# Patient Record
Sex: Male | Born: 1945 | Race: White | Hispanic: No | Smoking: Former smoker
Health system: Southern US, Community
[De-identification: ages and names within clinical notes are randomized; demographics above are authoritative.]

## PROBLEM LIST (undated history)

## (undated) DIAGNOSIS — J449 Chronic obstructive pulmonary disease, unspecified: Secondary | ICD-10-CM

## (undated) DIAGNOSIS — Z860100 Personal history of colon polyps, unspecified: Secondary | ICD-10-CM

## (undated) DIAGNOSIS — D179 Benign lipomatous neoplasm, unspecified: Secondary | ICD-10-CM

## (undated) DIAGNOSIS — E785 Hyperlipidemia, unspecified: Secondary | ICD-10-CM

## (undated) DIAGNOSIS — Z9889 Other specified postprocedural states: Secondary | ICD-10-CM

## (undated) DIAGNOSIS — G5600 Carpal tunnel syndrome, unspecified upper limb: Secondary | ICD-10-CM

## (undated) DIAGNOSIS — H409 Unspecified glaucoma: Secondary | ICD-10-CM

## (undated) DIAGNOSIS — R112 Nausea with vomiting, unspecified: Secondary | ICD-10-CM

## (undated) DIAGNOSIS — I639 Cerebral infarction, unspecified: Secondary | ICD-10-CM

## (undated) DIAGNOSIS — I10 Essential (primary) hypertension: Secondary | ICD-10-CM

## (undated) DIAGNOSIS — J309 Allergic rhinitis, unspecified: Secondary | ICD-10-CM

## (undated) DIAGNOSIS — F419 Anxiety disorder, unspecified: Secondary | ICD-10-CM

## (undated) DIAGNOSIS — C44219 Basal cell carcinoma of skin of left ear and external auricular canal: Secondary | ICD-10-CM

## (undated) DIAGNOSIS — J4489 Other specified chronic obstructive pulmonary disease: Secondary | ICD-10-CM

## (undated) DIAGNOSIS — I6529 Occlusion and stenosis of unspecified carotid artery: Secondary | ICD-10-CM

## (undated) DIAGNOSIS — Z85828 Personal history of other malignant neoplasm of skin: Secondary | ICD-10-CM

## (undated) DIAGNOSIS — T7840XA Allergy, unspecified, initial encounter: Secondary | ICD-10-CM

## (undated) DIAGNOSIS — F32A Depression, unspecified: Secondary | ICD-10-CM

## (undated) DIAGNOSIS — K219 Gastro-esophageal reflux disease without esophagitis: Secondary | ICD-10-CM

## (undated) DIAGNOSIS — M1710 Unilateral primary osteoarthritis, unspecified knee: Secondary | ICD-10-CM

## (undated) DIAGNOSIS — IMO0002 Reserved for concepts with insufficient information to code with codable children: Secondary | ICD-10-CM

## (undated) DIAGNOSIS — R0600 Dyspnea, unspecified: Secondary | ICD-10-CM

## (undated) DIAGNOSIS — C801 Malignant (primary) neoplasm, unspecified: Secondary | ICD-10-CM

## (undated) DIAGNOSIS — Z87442 Personal history of urinary calculi: Secondary | ICD-10-CM

## (undated) DIAGNOSIS — I2510 Atherosclerotic heart disease of native coronary artery without angina pectoris: Secondary | ICD-10-CM

## (undated) DIAGNOSIS — I739 Peripheral vascular disease, unspecified: Secondary | ICD-10-CM

## (undated) HISTORY — DX: Basal cell carcinoma of skin of left ear and external auricular canal: C44.219

## (undated) HISTORY — PX: COLONOSCOPY WITH ESOPHAGOGASTRODUODENOSCOPY (EGD): SHX5779

## (undated) HISTORY — DX: Allergic rhinitis, unspecified: J30.9

## (undated) HISTORY — DX: Personal history of colon polyps, unspecified: Z86.0100

## (undated) HISTORY — DX: Occlusion and stenosis of unspecified carotid artery: I65.29

## (undated) HISTORY — PX: CATARACT EXTRACTION: SUR2

## (undated) HISTORY — DX: Chronic obstructive pulmonary disease, unspecified: J44.9

## (undated) HISTORY — DX: Gastro-esophageal reflux disease without esophagitis: K21.9

## (undated) HISTORY — DX: Other specified chronic obstructive pulmonary disease: J44.89

## (undated) HISTORY — DX: Unilateral primary osteoarthritis, unspecified knee: M17.10

## (undated) HISTORY — PX: COLONOSCOPY W/ POLYPECTOMY: SHX1380

## (undated) HISTORY — DX: Personal history of other malignant neoplasm of skin: Z85.828

## (undated) HISTORY — DX: Personal history of colonic polyps: Z86.010

## (undated) HISTORY — DX: Carpal tunnel syndrome, unspecified upper limb: G56.00

## (undated) HISTORY — DX: Essential (primary) hypertension: I10

## (undated) HISTORY — DX: Malignant (primary) neoplasm, unspecified: C80.1

## (undated) HISTORY — PX: BACK SURGERY: SHX140

## (undated) HISTORY — PX: TOTAL KNEE ARTHROPLASTY: SHX125

## (undated) HISTORY — DX: Hyperlipidemia, unspecified: E78.5

## (undated) HISTORY — DX: Unspecified glaucoma: H40.9

## (undated) HISTORY — DX: Reserved for concepts with insufficient information to code with codable children: IMO0002

## (undated) HISTORY — DX: Benign lipomatous neoplasm, unspecified: D17.9

## (undated) HISTORY — DX: Allergy, unspecified, initial encounter: T78.40XA

---

## 2000-01-18 ENCOUNTER — Encounter: Payer: Self-pay | Admitting: Family Medicine

## 2000-01-18 ENCOUNTER — Encounter: Admission: RE | Admit: 2000-01-18 | Discharge: 2000-01-18 | Payer: Self-pay | Admitting: Family Medicine

## 2000-02-14 ENCOUNTER — Encounter: Payer: Self-pay | Admitting: Family Medicine

## 2000-02-14 ENCOUNTER — Encounter: Admission: RE | Admit: 2000-02-14 | Discharge: 2000-02-14 | Payer: Self-pay | Admitting: Family Medicine

## 2000-09-02 ENCOUNTER — Encounter: Payer: Self-pay | Admitting: Family Medicine

## 2000-09-02 ENCOUNTER — Encounter: Admission: RE | Admit: 2000-09-02 | Discharge: 2000-09-02 | Payer: Self-pay | Admitting: Family Medicine

## 2003-02-08 ENCOUNTER — Encounter: Admission: RE | Admit: 2003-02-08 | Discharge: 2003-02-08 | Payer: Self-pay | Admitting: Family Medicine

## 2003-02-24 ENCOUNTER — Encounter: Admission: RE | Admit: 2003-02-24 | Discharge: 2003-02-24 | Payer: Self-pay | Admitting: Family Medicine

## 2003-06-01 HISTORY — PX: OTHER SURGICAL HISTORY: SHX169

## 2003-06-09 ENCOUNTER — Ambulatory Visit (HOSPITAL_BASED_OUTPATIENT_CLINIC_OR_DEPARTMENT_OTHER): Admission: RE | Admit: 2003-06-09 | Discharge: 2003-06-09 | Payer: Self-pay | Admitting: *Deleted

## 2003-06-09 ENCOUNTER — Ambulatory Visit (HOSPITAL_COMMUNITY): Admission: RE | Admit: 2003-06-09 | Discharge: 2003-06-09 | Payer: Self-pay | Admitting: *Deleted

## 2003-10-01 ENCOUNTER — Encounter (INDEPENDENT_AMBULATORY_CARE_PROVIDER_SITE_OTHER): Payer: Self-pay | Admitting: Internal Medicine

## 2004-05-03 LAB — FECAL OCCULT BLOOD, GUAIAC: Fecal Occult Blood: NEGATIVE

## 2004-05-25 ENCOUNTER — Ambulatory Visit: Payer: Self-pay | Admitting: Family Medicine

## 2004-05-31 ENCOUNTER — Ambulatory Visit: Payer: Self-pay | Admitting: Family Medicine

## 2004-06-22 ENCOUNTER — Ambulatory Visit: Payer: Self-pay | Admitting: Family Medicine

## 2004-09-12 ENCOUNTER — Ambulatory Visit: Payer: Self-pay | Admitting: Family Medicine

## 2004-12-01 ENCOUNTER — Encounter (INDEPENDENT_AMBULATORY_CARE_PROVIDER_SITE_OTHER): Payer: Self-pay | Admitting: Internal Medicine

## 2004-12-01 LAB — CONVERTED CEMR LAB: PSA: 0.35 ng/mL

## 2004-12-20 ENCOUNTER — Ambulatory Visit: Payer: Self-pay | Admitting: Family Medicine

## 2005-02-02 ENCOUNTER — Ambulatory Visit: Payer: Self-pay | Admitting: Family Medicine

## 2005-03-21 ENCOUNTER — Ambulatory Visit (HOSPITAL_COMMUNITY): Admission: RE | Admit: 2005-03-21 | Discharge: 2005-03-21 | Payer: Self-pay | Admitting: *Deleted

## 2005-03-21 ENCOUNTER — Encounter (INDEPENDENT_AMBULATORY_CARE_PROVIDER_SITE_OTHER): Payer: Self-pay | Admitting: *Deleted

## 2005-03-21 ENCOUNTER — Ambulatory Visit (HOSPITAL_BASED_OUTPATIENT_CLINIC_OR_DEPARTMENT_OTHER): Admission: RE | Admit: 2005-03-21 | Discharge: 2005-03-21 | Payer: Self-pay | Admitting: *Deleted

## 2005-03-23 ENCOUNTER — Ambulatory Visit: Payer: Self-pay | Admitting: Family Medicine

## 2005-06-19 ENCOUNTER — Ambulatory Visit: Payer: Self-pay | Admitting: Family Medicine

## 2005-06-25 ENCOUNTER — Ambulatory Visit: Payer: Self-pay | Admitting: Family Medicine

## 2005-07-27 ENCOUNTER — Ambulatory Visit: Payer: Self-pay | Admitting: Family Medicine

## 2005-12-01 ENCOUNTER — Encounter (INDEPENDENT_AMBULATORY_CARE_PROVIDER_SITE_OTHER): Payer: Self-pay | Admitting: Internal Medicine

## 2005-12-01 LAB — CONVERTED CEMR LAB: PSA: 1 ng/mL

## 2005-12-13 ENCOUNTER — Ambulatory Visit: Payer: Self-pay | Admitting: Family Medicine

## 2006-01-16 ENCOUNTER — Ambulatory Visit: Payer: Self-pay | Admitting: Family Medicine

## 2006-02-07 ENCOUNTER — Ambulatory Visit: Payer: Self-pay | Admitting: Family Medicine

## 2006-04-12 ENCOUNTER — Ambulatory Visit: Payer: Self-pay | Admitting: Family Medicine

## 2006-06-13 ENCOUNTER — Ambulatory Visit: Payer: Self-pay | Admitting: Family Medicine

## 2006-06-13 LAB — CONVERTED CEMR LAB
CO2: 30 meq/L (ref 19–32)
Chloride: 107 meq/L (ref 96–112)
Cholesterol: 200 mg/dL (ref 0–200)
Creatinine, Ser: 0.7 mg/dL (ref 0.4–1.5)
HDL: 69.1 mg/dL (ref >39.0)
Potassium: 5.1 meq/L (ref 3.5–5.1)
Sodium: 143 meq/L (ref 135–145)
Triglycerides: 83 mg/dL (ref 0–149)

## 2006-07-30 ENCOUNTER — Telehealth (INDEPENDENT_AMBULATORY_CARE_PROVIDER_SITE_OTHER): Payer: Self-pay | Admitting: Internal Medicine

## 2006-07-30 ENCOUNTER — Encounter: Admission: RE | Admit: 2006-07-30 | Discharge: 2006-07-30 | Payer: Self-pay | Admitting: Family Medicine

## 2006-09-03 ENCOUNTER — Ambulatory Visit: Payer: Self-pay | Admitting: Family Medicine

## 2006-09-30 ENCOUNTER — Encounter: Payer: Self-pay | Admitting: Internal Medicine

## 2006-09-30 DIAGNOSIS — E785 Hyperlipidemia, unspecified: Secondary | ICD-10-CM | POA: Insufficient documentation

## 2006-09-30 DIAGNOSIS — K219 Gastro-esophageal reflux disease without esophagitis: Secondary | ICD-10-CM | POA: Insufficient documentation

## 2006-09-30 DIAGNOSIS — G5600 Carpal tunnel syndrome, unspecified upper limb: Secondary | ICD-10-CM | POA: Insufficient documentation

## 2006-09-30 DIAGNOSIS — Z8679 Personal history of other diseases of the circulatory system: Secondary | ICD-10-CM | POA: Insufficient documentation

## 2006-09-30 DIAGNOSIS — Z85828 Personal history of other malignant neoplasm of skin: Secondary | ICD-10-CM | POA: Insufficient documentation

## 2006-09-30 DIAGNOSIS — I10 Essential (primary) hypertension: Secondary | ICD-10-CM | POA: Insufficient documentation

## 2006-09-30 DIAGNOSIS — F528 Other sexual dysfunction not due to a substance or known physiological condition: Secondary | ICD-10-CM | POA: Insufficient documentation

## 2006-12-24 ENCOUNTER — Ambulatory Visit: Payer: Self-pay | Admitting: Family Medicine

## 2006-12-24 DIAGNOSIS — D179 Benign lipomatous neoplasm, unspecified: Secondary | ICD-10-CM | POA: Insufficient documentation

## 2006-12-25 LAB — CONVERTED CEMR LAB
AST: 28 units/L (ref 0–37)
BUN: 19 mg/dL (ref 6–23)
CO2: 30 meq/L (ref 19–32)
Calcium: 9.9 mg/dL (ref 8.4–10.5)
Cholesterol: 191 mg/dL (ref 0–200)
Creatinine, Ser: 0.8 mg/dL (ref 0.4–1.5)
GFR calc Af Amer: 127 mL/min
Glucose, Bld: 112 mg/dL — ABNORMAL HIGH (ref 70–99)
PSA: 0.78 ng/mL (ref 0.10–4.00)
Total CHOL/HDL Ratio: 2.7
Triglycerides: 78 mg/dL (ref 0–149)

## 2007-01-24 ENCOUNTER — Ambulatory Visit: Payer: Self-pay | Admitting: Family Medicine

## 2007-03-18 ENCOUNTER — Ambulatory Visit: Payer: Self-pay | Admitting: Family Medicine

## 2007-03-24 HISTORY — PX: SHOULDER SURGERY: SHX246

## 2007-04-07 ENCOUNTER — Encounter (INDEPENDENT_AMBULATORY_CARE_PROVIDER_SITE_OTHER): Payer: Self-pay | Admitting: Internal Medicine

## 2007-05-05 ENCOUNTER — Encounter: Payer: Self-pay | Admitting: Family Medicine

## 2007-05-12 ENCOUNTER — Ambulatory Visit: Payer: Self-pay | Admitting: Family Medicine

## 2007-07-02 ENCOUNTER — Ambulatory Visit: Payer: Self-pay | Admitting: Family Medicine

## 2007-07-02 DIAGNOSIS — E669 Obesity, unspecified: Secondary | ICD-10-CM | POA: Insufficient documentation

## 2007-07-02 DIAGNOSIS — F411 Generalized anxiety disorder: Secondary | ICD-10-CM | POA: Insufficient documentation

## 2007-07-07 LAB — CONVERTED CEMR LAB
BUN: 22 mg/dL (ref 6–23)
CO2: 27 meq/L (ref 19–32)
Calcium: 9.5 mg/dL (ref 8.4–10.5)
Creatinine, Ser: 0.7 mg/dL (ref 0.4–1.5)
GFR calc Af Amer: 148 mL/min
Glucose, Bld: 115 mg/dL — ABNORMAL HIGH (ref 70–99)
HDL: 75.1 mg/dL (ref >39.0)
Sodium: 139 meq/L (ref 135–145)
Triglycerides: 86 mg/dL (ref 0–149)
VLDL: 17 mg/dL (ref 0–40)

## 2007-07-08 ENCOUNTER — Telehealth (INDEPENDENT_AMBULATORY_CARE_PROVIDER_SITE_OTHER): Payer: Self-pay | Admitting: Internal Medicine

## 2007-07-21 ENCOUNTER — Telehealth (INDEPENDENT_AMBULATORY_CARE_PROVIDER_SITE_OTHER): Payer: Self-pay | Admitting: Internal Medicine

## 2007-07-21 DIAGNOSIS — J309 Allergic rhinitis, unspecified: Secondary | ICD-10-CM | POA: Insufficient documentation

## 2007-07-22 ENCOUNTER — Ambulatory Visit: Payer: Self-pay | Admitting: Family Medicine

## 2007-08-15 ENCOUNTER — Telehealth: Payer: Self-pay | Admitting: Internal Medicine

## 2007-08-19 ENCOUNTER — Encounter (INDEPENDENT_AMBULATORY_CARE_PROVIDER_SITE_OTHER): Payer: Self-pay | Admitting: Internal Medicine

## 2007-08-19 ENCOUNTER — Ambulatory Visit: Payer: Self-pay | Admitting: Family Medicine

## 2007-08-29 ENCOUNTER — Ambulatory Visit: Payer: Self-pay | Admitting: Family Medicine

## 2007-08-29 ENCOUNTER — Encounter (INDEPENDENT_AMBULATORY_CARE_PROVIDER_SITE_OTHER): Payer: Self-pay | Admitting: Internal Medicine

## 2007-09-01 HISTORY — PX: KNEE ARTHROSCOPY: SUR90

## 2007-09-04 ENCOUNTER — Encounter (INDEPENDENT_AMBULATORY_CARE_PROVIDER_SITE_OTHER): Payer: Self-pay | Admitting: Internal Medicine

## 2007-09-29 ENCOUNTER — Telehealth (INDEPENDENT_AMBULATORY_CARE_PROVIDER_SITE_OTHER): Payer: Self-pay | Admitting: Internal Medicine

## 2007-09-30 ENCOUNTER — Encounter (INDEPENDENT_AMBULATORY_CARE_PROVIDER_SITE_OTHER): Payer: Self-pay | Admitting: Internal Medicine

## 2007-10-02 ENCOUNTER — Ambulatory Visit: Payer: Self-pay | Admitting: Internal Medicine

## 2007-10-02 DIAGNOSIS — J449 Chronic obstructive pulmonary disease, unspecified: Secondary | ICD-10-CM | POA: Insufficient documentation

## 2007-10-02 DIAGNOSIS — J441 Chronic obstructive pulmonary disease with (acute) exacerbation: Secondary | ICD-10-CM | POA: Insufficient documentation

## 2007-10-02 HISTORY — DX: Chronic obstructive pulmonary disease with (acute) exacerbation: J44.1

## 2007-10-17 ENCOUNTER — Telehealth (INDEPENDENT_AMBULATORY_CARE_PROVIDER_SITE_OTHER): Payer: Self-pay | Admitting: *Deleted

## 2007-10-28 ENCOUNTER — Ambulatory Visit: Payer: Self-pay | Admitting: Internal Medicine

## 2007-11-03 ENCOUNTER — Encounter: Payer: Self-pay | Admitting: Internal Medicine

## 2007-11-04 ENCOUNTER — Ambulatory Visit: Payer: Self-pay | Admitting: Internal Medicine

## 2007-11-04 DIAGNOSIS — F17200 Nicotine dependence, unspecified, uncomplicated: Secondary | ICD-10-CM | POA: Insufficient documentation

## 2008-01-06 ENCOUNTER — Ambulatory Visit: Payer: Self-pay | Admitting: Family Medicine

## 2008-01-08 LAB — CONVERTED CEMR LAB
AST: 32 units/L (ref 0–37)
BUN: 23 mg/dL (ref 6–23)
Calcium: 9.3 mg/dL (ref 8.4–10.5)
Chloride: 105 meq/L (ref 96–112)
Cholesterol: 177 mg/dL (ref 0–200)
Creatinine, Ser: 0.7 mg/dL (ref 0.4–1.5)
GFR calc Af Amer: 147 mL/min
GFR calc non Af Amer: 122 mL/min
HDL: 72.9 mg/dL (ref >39.0)
LDL Cholesterol: 87 mg/dL (ref 0–99)
Total CHOL/HDL Ratio: 2.4
Triglycerides: 86 mg/dL (ref 0–149)
VLDL: 17 mg/dL (ref 0–40)

## 2008-02-05 ENCOUNTER — Ambulatory Visit: Payer: Self-pay | Admitting: Internal Medicine

## 2008-02-18 ENCOUNTER — Ambulatory Visit: Payer: Self-pay | Admitting: Family Medicine

## 2008-02-19 ENCOUNTER — Telehealth (INDEPENDENT_AMBULATORY_CARE_PROVIDER_SITE_OTHER): Payer: Self-pay | Admitting: Internal Medicine

## 2008-03-17 ENCOUNTER — Ambulatory Visit: Payer: Self-pay | Admitting: Family Medicine

## 2008-03-17 ENCOUNTER — Encounter (INDEPENDENT_AMBULATORY_CARE_PROVIDER_SITE_OTHER): Payer: Self-pay | Admitting: Internal Medicine

## 2008-04-09 ENCOUNTER — Telehealth (INDEPENDENT_AMBULATORY_CARE_PROVIDER_SITE_OTHER): Payer: Self-pay | Admitting: Internal Medicine

## 2008-04-27 ENCOUNTER — Ambulatory Visit: Payer: Self-pay | Admitting: Family Medicine

## 2008-06-20 ENCOUNTER — Encounter (INDEPENDENT_AMBULATORY_CARE_PROVIDER_SITE_OTHER): Payer: Self-pay | Admitting: Internal Medicine

## 2008-07-06 ENCOUNTER — Ambulatory Visit: Payer: Self-pay | Admitting: Family Medicine

## 2008-07-07 LAB — CONVERTED CEMR LAB
ALT: 33 units/L (ref 0–53)
Albumin: 4 g/dL (ref 3.5–5.2)
Bilirubin, Direct: 0 mg/dL (ref 0.0–0.3)
Total Protein: 7.2 g/dL (ref 6.0–8.3)

## 2008-07-08 LAB — CONVERTED CEMR LAB
Cholesterol: 182 mg/dL (ref 0–200)
LDL Cholesterol: 90 mg/dL (ref 0–99)

## 2008-07-23 ENCOUNTER — Ambulatory Visit: Payer: Self-pay | Admitting: Family Medicine

## 2008-07-23 DIAGNOSIS — M1710 Unilateral primary osteoarthritis, unspecified knee: Secondary | ICD-10-CM

## 2008-07-23 DIAGNOSIS — G8929 Other chronic pain: Secondary | ICD-10-CM | POA: Insufficient documentation

## 2008-07-29 ENCOUNTER — Ambulatory Visit: Payer: Self-pay | Admitting: Family Medicine

## 2008-07-29 ENCOUNTER — Encounter: Payer: Self-pay | Admitting: Family Medicine

## 2008-09-06 ENCOUNTER — Telehealth: Payer: Self-pay | Admitting: Family Medicine

## 2008-09-07 ENCOUNTER — Ambulatory Visit: Payer: Self-pay | Admitting: Family Medicine

## 2008-10-04 ENCOUNTER — Encounter (INDEPENDENT_AMBULATORY_CARE_PROVIDER_SITE_OTHER): Payer: Self-pay | Admitting: Internal Medicine

## 2008-10-04 LAB — HM COLONOSCOPY: HM Colonoscopy: ABNORMAL

## 2008-10-08 ENCOUNTER — Ambulatory Visit: Payer: Self-pay | Admitting: Family Medicine

## 2008-10-13 ENCOUNTER — Encounter (INDEPENDENT_AMBULATORY_CARE_PROVIDER_SITE_OTHER): Payer: Self-pay | Admitting: *Deleted

## 2008-10-27 ENCOUNTER — Ambulatory Visit: Payer: Self-pay | Admitting: Family Medicine

## 2008-10-27 ENCOUNTER — Encounter (INDEPENDENT_AMBULATORY_CARE_PROVIDER_SITE_OTHER): Payer: Self-pay | Admitting: Internal Medicine

## 2008-10-27 DIAGNOSIS — K439 Ventral hernia without obstruction or gangrene: Secondary | ICD-10-CM | POA: Insufficient documentation

## 2008-10-27 LAB — CONVERTED CEMR LAB
BUN: 20 mg/dL (ref 6–23)
CO2: 30 meq/L (ref 19–32)
Chloride: 105 meq/L (ref 96–112)
PSA: 1.07 ng/mL (ref 0.10–4.00)
Potassium: 4.4 meq/L (ref 3.5–5.1)

## 2009-01-10 ENCOUNTER — Ambulatory Visit: Payer: Self-pay | Admitting: Family Medicine

## 2009-01-11 ENCOUNTER — Telehealth (INDEPENDENT_AMBULATORY_CARE_PROVIDER_SITE_OTHER): Payer: Self-pay | Admitting: Internal Medicine

## 2009-01-11 LAB — CONVERTED CEMR LAB
ALT: 37 units/L (ref 0–53)
Cholesterol: 179 mg/dL (ref 0–200)
HDL: 67.2 mg/dL (ref >39.00)
LDL Cholesterol: 88 mg/dL (ref 0–99)
VLDL: 23.8 mg/dL (ref 0.0–40.0)

## 2009-01-21 ENCOUNTER — Ambulatory Visit: Payer: Self-pay | Admitting: Family Medicine

## 2009-02-02 ENCOUNTER — Encounter (INDEPENDENT_AMBULATORY_CARE_PROVIDER_SITE_OTHER): Payer: Self-pay | Admitting: Internal Medicine

## 2009-02-02 ENCOUNTER — Ambulatory Visit: Payer: Self-pay | Admitting: Family Medicine

## 2009-02-17 ENCOUNTER — Ambulatory Visit: Payer: Self-pay | Admitting: Family Medicine

## 2009-02-21 ENCOUNTER — Ambulatory Visit: Payer: Self-pay | Admitting: Family Medicine

## 2009-02-23 LAB — CONVERTED CEMR LAB
ALT: 33 units/L (ref 0–53)
BUN: 18 mg/dL (ref 6–23)
Basophils Absolute: 0 10*3/uL (ref 0.0–0.1)
Chloride: 105 meq/L (ref 96–112)
Eosinophils Absolute: 0.3 10*3/uL (ref 0.0–0.7)
Glucose, Bld: 118 mg/dL — ABNORMAL HIGH (ref 70–99)
HCT: 41 % (ref 39.0–52.0)
Lymphs Abs: 1.6 10*3/uL (ref 0.7–4.0)
MCHC: 34.6 g/dL (ref 30.0–36.0)
MCV: 93 fL (ref 78.0–100.0)
Monocytes Absolute: 0.5 10*3/uL (ref 0.1–1.0)
Platelets: 167 10*3/uL (ref 150.0–400.0)
Potassium: 4.8 meq/L (ref 3.5–5.1)
RDW: 12.1 % (ref 11.5–14.6)
Total Bilirubin: 0.7 mg/dL (ref 0.3–1.2)

## 2009-02-28 ENCOUNTER — Ambulatory Visit: Payer: Self-pay | Admitting: General Practice

## 2009-03-04 ENCOUNTER — Encounter (INDEPENDENT_AMBULATORY_CARE_PROVIDER_SITE_OTHER): Payer: Self-pay | Admitting: Internal Medicine

## 2009-03-15 ENCOUNTER — Inpatient Hospital Stay: Payer: Self-pay | Admitting: General Practice

## 2009-05-26 ENCOUNTER — Ambulatory Visit: Payer: Self-pay | Admitting: Family Medicine

## 2009-07-15 ENCOUNTER — Ambulatory Visit: Payer: Self-pay | Admitting: Family Medicine

## 2009-07-19 LAB — CONVERTED CEMR LAB
Cholesterol: 141 mg/dL (ref 0–200)
Glucose, Bld: 99 mg/dL (ref 70–99)
LDL Cholesterol: 77 mg/dL (ref 0–99)
Triglycerides: 64 mg/dL (ref 0.0–149.0)
VLDL: 12.8 mg/dL (ref 0.0–40.0)

## 2009-09-20 ENCOUNTER — Ambulatory Visit: Payer: Self-pay | Admitting: Family Medicine

## 2009-09-20 DIAGNOSIS — M549 Dorsalgia, unspecified: Secondary | ICD-10-CM

## 2009-09-20 DIAGNOSIS — R300 Dysuria: Secondary | ICD-10-CM | POA: Insufficient documentation

## 2009-09-20 DIAGNOSIS — G8929 Other chronic pain: Secondary | ICD-10-CM | POA: Insufficient documentation

## 2009-09-20 LAB — CONVERTED CEMR LAB
Blood in Urine, dipstick: NEGATIVE
Ketones, urine, test strip: NEGATIVE
Nitrite: NEGATIVE
Specific Gravity, Urine: 1.015

## 2009-09-21 ENCOUNTER — Encounter: Payer: Self-pay | Admitting: Family Medicine

## 2009-10-25 ENCOUNTER — Ambulatory Visit: Payer: Self-pay | Admitting: Family Medicine

## 2009-10-25 LAB — CONVERTED CEMR LAB
Albumin: 4.5 g/dL (ref 3.5–5.2)
Alkaline Phosphatase: 73 units/L (ref 39–117)
BUN: 25 mg/dL — ABNORMAL HIGH (ref 6–23)
Basophils Relative: 0.8 % (ref 0.0–3.0)
Calcium: 9.5 mg/dL (ref 8.4–10.5)
Creatinine, Ser: 0.7 mg/dL (ref 0.4–1.5)
Eosinophils Relative: 5.3 % — ABNORMAL HIGH (ref 0.0–5.0)
GFR calc non Af Amer: 113.43 mL/min (ref >60)
Glucose, Bld: 122 mg/dL — ABNORMAL HIGH (ref 70–99)
HCT: 40.5 % (ref 39.0–52.0)
HDL: 83.3 mg/dL (ref >39.00)
Hemoglobin: 14 g/dL (ref 13.0–17.0)
Lymphs Abs: 1.7 10*3/uL (ref 0.7–4.0)
Monocytes Relative: 9.9 % (ref 3.0–12.0)
Neutro Abs: 2.9 10*3/uL (ref 1.4–7.7)
RBC: 4.44 M/uL (ref 4.22–5.81)
Sodium: 141 meq/L (ref 135–145)
Triglycerides: 144 mg/dL (ref 0.0–149.0)
WBC: 5.4 10*3/uL (ref 4.5–10.5)

## 2009-10-28 ENCOUNTER — Ambulatory Visit: Payer: Self-pay | Admitting: Family Medicine

## 2010-01-06 ENCOUNTER — Ambulatory Visit: Payer: Self-pay | Admitting: Family Medicine

## 2010-01-27 ENCOUNTER — Telehealth: Payer: Self-pay | Admitting: Family Medicine

## 2010-03-31 ENCOUNTER — Ambulatory Visit
Admission: RE | Admit: 2010-03-31 | Discharge: 2010-03-31 | Payer: Self-pay | Source: Home / Self Care | Attending: Family Medicine | Admitting: Family Medicine

## 2010-03-31 DIAGNOSIS — M6200 Separation of muscle (nontraumatic), unspecified site: Secondary | ICD-10-CM | POA: Insufficient documentation

## 2010-03-31 DIAGNOSIS — J441 Chronic obstructive pulmonary disease with (acute) exacerbation: Secondary | ICD-10-CM | POA: Insufficient documentation

## 2010-04-14 ENCOUNTER — Ambulatory Visit: Payer: Self-pay

## 2010-05-04 NOTE — Assessment & Plan Note (Signed)
Summary: CPX/DLO  R/S FROM 11/01/09   Vital Signs:  Patient profile:   65 year old male Height:      70.5 inches Weight:      245.4 pounds BMI:     34.84 Temp:     98.7 degrees F oral Pulse rate:   72 / minute Pulse rhythm:   regular BP sitting:   102 / 72  (left arm) Cuff size:   regular  Vitals Entered By: Benny Lennert CMA [facility] Dull) (October 28, 2009 8:29 AM)  History of Present Illness: Chief complaint cpx  Preventive Screening-Counseling & Management  Alcohol-Tobacco     Alcohol drinks/day: <1     Alcohol type: beer     Smoking Status: quit     Packs/Day: 3.0     Year Started: 1962     Year Quit: 01/23/1997     Cigars/week: 0     Pipe use/week: 0     Cans of tobacco/week: 0     Passive Smoke Exposure: no  Caffeine-Diet-Exercise     Caffeine use/day: 1     Does Patient Exercise: no     Type of exercise: walks 5xwk for20-30 min treadmill or outdoors, weather permitting.     Times/week: 5  Hep-HIV-STD-Contraception     HIV Risk: no     Sun Exposure-Excessive: frequently  Safety-Violence-Falls     Seat Belt Use: 100      Sexual History:  currently monogamous.        Drug Use:  no.    Clinical Review Panels:  Prevention   Last Colonoscopy:  abnormal (10/04/2008)   Last PSA:  0.90 (10/25/2009)  Immunizations   Last Tetanus Booster:  Td (06/13/2006)   Last Flu Vaccine:  Fluvax 3+ (02/02/2009)   Last Zoster Vaccine:  Zostavax (09/03/2006)  Lipid Management   Cholesterol:  204 (10/25/2009)   LDL (bad choesterol):  77 (07/15/2009)   HDL (good cholesterol):  83.30 (10/25/2009)  Diabetes Management   Creatinine:  0.7 (10/25/2009)   Last Flu Vaccine:  Fluvax 3+ (02/02/2009)  CBC   WBC:  5.4 (10/25/2009)   RBC:  4.44 (10/25/2009)   Hgb:  14.0 (10/25/2009)   Hct:  40.5 (10/25/2009)   Platelets:  156.0 (10/25/2009)   MCV  91.2 (10/25/2009)   MCHC  34.6 (10/25/2009)   RDW  13.4 (10/25/2009)   PMN:  53.2 (10/25/2009)   Lymphs:  30.8 (10/25/2009)   Monos:   9.9 (10/25/2009)   Eosinophils:  5.3 (10/25/2009)   Basophil:  0.8 (10/25/2009)  Complete Metabolic Panel   Glucose:  122 (10/25/2009)   Sodium:  141 (10/25/2009)   Potassium:  5.0 (10/25/2009)   Chloride:  104 (10/25/2009)   CO2:  30 (10/25/2009)   BUN:  25 (10/25/2009)   Creatinine:  0.7 (10/25/2009)   Albumin:  4.5 (10/25/2009)   Total Protein:  7.5 (10/25/2009)   Calcium:  9.5 (10/25/2009)   Total Bili:  0.7 (10/25/2009)   Alk Phos:  73 (10/25/2009)   SGPT (ALT):  34 (10/25/2009)   SGOT (AST):  28 (10/25/2009)   Allergies (verified): No Known Drug Allergies  Past History:  Past medical, surgical, family and social histories (including risk factors) reviewed, and no changes noted (except as noted below).  Past Medical History: Reviewed history from 05/26/2009 and no changes required. 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 03/23/2009: R TKR, Dr. Milinda Antis 5/99        ETT (Dr. Kathie Rhodes         ) 11/01      Limited CT L/S area - sugg. MR 6/03        EMG on CS - Bilateral carpal tunnel 02/24/03 MRI L/S  L5-S1 narrow-- disc bulg L4-5 greater than L3-4 3/05        Right thumb - bone spurs 03/21/05 _________ Left forearm, Left wrist ______ on arthropathy ____________ 5/07        Colonoscopy R shoulder surg to remove bone spurs--03/24/07--Dean 6/09        R arthroscopy, partial medial menisectomy,  patellar chondroplasty August Saucer) 09/2008--colonoscopy--polyps, benign  Family History: Reviewed history from 07/02/2007 and no changes required. Father: Alive 52, chol, HTN--died Mar 24, 1999--complications of lung surg for lung cancer Mother: Alive 28, cholesterolemia, DM, obesity, HTN--died 08/08/06--? MI Siblings: 2 brothers, both with high cholesterol, HBP 2 sisters, one with HTN and high cholesterol, the other high cholesterol and lung cancer   Uncle died of unknown CA, age 19 CV:  (-) HBP:  (+) mother, father,  DM:  (+) mother Stroke:  (-)  Social History: Reviewed history from 10/02/2007 and no changes required. Former Smoker, 2 PPD since age 21, quit 10/98  Alcohol use-yes Drug use-no Regular exercise-no Marital Status: Married x 17 years, divorced, lives alone Children: 105 yo daughter, 56 yo step-son Occupation: Works at [employer] Sexual History:  currently monogamous  Review of Systems  General: Denies fever, chills, sweats, and anorexia. Eyes: Denies blurring. ENT: Denies earache, ear discharge, decreased hearing, nasal congestion, and sore throat. CV: Denies chest pains, dyspnea on exertion, palpitations, and syncope. Resp: Denies cough, cough with exercise, dyspnea at rest, excessive sputum, nighttime cough or wheeze, and wheezing GI: Denies nausea, vomiting, diarrhea, constipation, change in bowel habits, abdominal pain, melena, BRBPR  GU: dysuria, discharge, frequency,genital sores, STD concern. MS: no back pain, joint pain, stiffness, and arthritis. Derm: No rash, itching, and dryness Neuro: No abnormal gait, frequent headaches, paresthesias, seizures, vertigo, and weakness Psych: No anxiety, behavioral problems, compulsive behavior, depression, hyperactivity, and inattentive. Endo: No polydipsia, polyphagia, polyuria, and unusual weight change Heme: No bruising or LAD Allergy: No urticaria or hayfever   Otherwise, the pertinent positives and  negatives are listed above and in the HPI, otherwise a full review of systems has been reviewed and is negative unless noted positive.    Impression & Recommendations:  Problem # 1:  WELL ADULT EXAM (ICD-V70.0) The patient's preventative maintenance and recommended screening tests for an annual wellness exam were reviewed in full today. Brought up to date unless services declined.  Counselled on the importance of diet, exercise, and its role in overall health and mortality. The patient's FH and SH was reviewed, including their home life, tobacco status, and drug and alcohol status.   Prediabetic and obese -- has to lose weight  Complete Medication List: 1)  Prilosec 20 Mg Cpdr (Omeprazole) .... Once daily 2)  One-daily Multivitamins Tabs (Multiple vitamin) .... Once daily 3)  Lipitor 80 Mg Tabs (Atorvastatin calcium) .Marland Kitchen.. 1 by mouth daily 4)  Sertraline Hcl 50 Mg Tabs (Sertraline hcl) .Marland Kitchen.. 1 by mouth  daily 5)  Adult Aspirin Ec Low Strength 81 Mg Tbec (Aspirin) .... Take 1 tablet by mouth once a day 6)  Lisinopril 40 Mg Tabs (Lisinopril) .Marland Kitchen.. 1 once daily fo hbp 7)  Norvasc 5 Mg Tabs (Amlodipine besylate) .Marland Kitchen.. 1 once daily for bp by mouth 8)  Etodolac 400 Mg Tabs (Etodolac) .... Take one by mouth two times a day 9)  Furosemide 40 Mg Tabs (Furosemide) .... Take one by mouth two times a day as needed 10)  Voltaren 1 % Gel (Diclofenac sodium) .... Apply to knee 4 times daily 11)  Fish Oil Oil (Fish oil) .... Otc as directed. 12)  Garlic Oil 500 Mg Tabs (Garlic)  Patient Instructions: 1)  f/u 1 year for a CPX 2)  Prephysical Labs, several days before, fasting 3)  BMP, HFP, FLP, CBC with diff, TSH, PSA: 272.4, v77.1, ,780.79, v76.44  Prescriptions: NORVASC 5 MG TABS (AMLODIPINE BESYLATE) 1 once daily for BP by mouth  #30 x 11   Entered and Authorized by:   Hannah Beat MD   Signed by:   Hannah Beat MD on 10/28/2009   Method used:   Electronically to        [facility]*  (retail)       [address]       [place], [place]  [address]       Ph: [phone]       Fax: [phone]   RxID:   [id number]   Current Allergies (reviewed today): No known allergies   Physical Exam General Appearance: well developed, well nourished, no acute distress Eyes: conjunctiva and lids normal, PERRLA, EOMI Ears, Nose, Mouth, Throat: TM clear, nares clear, oral exam WNL Neck: supple, no lymphadenopathy, no thyromegaly, no JVD Respiratory: clear to auscultation and percussion, respiratory effort normal Cardiovascular: regular rate and rhythm, S1-S2, no murmur, rub or gallop, no bruits, peripheral pulses normal and symmetric, no cyanosis, clubbing, edema or varicosities Chest: no scars, masses, tenderness; no asymmetry, skin changes, nipple discharge, no gynecomastia   Gastrointestinal: soft, non-tender; no hepatosplenomegaly, masses; active bowel sounds all quadrants,  no masses, tenderness, hemorrhoids  Genitourinary: no hernia, testicular mass, penile discharge, some mild prostate enlargement Lymphatic: no cervical, axillary or inguinal adenopathy Musculoskeletal: gait normal, muscle tone and strength WNL, no joint swelling, effusions, discoloration, crepitus  Skin: clear, good turgor, color WNL, no rashes, lesions, or ulcerations, small abd lipoma Neurologic: normal mental status, normal reflexes, normal strength, sensation, and motion Psychiatric: alert; oriented to person, place and time Other Exam:

## 2010-05-04 NOTE — Assessment & Plan Note (Signed)
Summary: 30 MIN REFILL MEDS/[name]   Vital Signs:  Patient profile:   65 year old male Height:      70.5 inches Weight:      234.2 pounds BMI:     33.25 Temp:     98.3 degrees F oral Pulse rate:   72 / minute Pulse rhythm:   regular BP sitting:   120 / 78  (left arm) Cuff size:   regular  Vitals Entered By: Benny Lennert CMA Duncan Dull) (May 26, 2009 8:26 AM)  History of Present Illness: Chief complaint 30 min, [name] med refill  65 year old male:  HTN, stable. Tol all meds, needs refills.  Chol: recently stable, tol meds, needs refills  Dep: stable on Zoloft, no probs now  OA: doing great with R knee s/p TKR, now 10 weeks out, L knee continues to bother him  Current Problems (verified): 1)  Ventral Hernia  (ICD-553.20) 2)  Osteoarthritis, Knee, Right  (ICD-715.96) 3)  COPD  (ICD-496) 4)  Allergic Rhinitis  (ICD-477.9) 5)  Hx of Tobacco Abuse  (ICD-305.1) 6)  Obesity  (ICD-278.00) 7)  Anxiety  (ICD-300.00) 8)  Well Adult Exam  (ICD-V70.0) 9)  Screening For Malignant Neoplasm, Prostate  (ICD-V76.44) 10)  Lipoma, Back  (ICD-214.9) 11)  Carpal Tunnel Syndrome, Bilateral  (ICD-354.0) 12)  Erectile Dysfunction  (ICD-302.72) 13)  Angina, Hx of  (ICD-V12.50) 14)  Hypertension  (ICD-401.9) 15)  Hyperlipidemia  (ICD-272.4) 16)  Gerd  (ICD-530.81) 17)  Colonic Polyps, Hx of  (ICD-V12.72) 18)  Skin Cancer, Hx of  (ICD-V10.83)  Allergies (verified): No Known Drug Allergies  Past History:  Past medical, surgical, family and social histories (including risk factors) reviewed, and no changes noted (except as noted below).  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 Milinda Antis 5/99        ETT (Dr. Kathie Rhodes         ) 11/01      Limited CT L/S area - sugg. MR 6/03        EMG on CS - Bilateral carpal tunnel 02/24/03 MRI L/S  L5-S1 narrow-- disc bulg L4-5 greater than L3-4 3/05        Right thumb - bone spurs 03/21/05 _________ Left forearm, Left wrist ______ on arthropathy ____________ 5/07        Colonoscopy R shoulder surg to remove bone spurs--03/24/07--Dean 6/09        R arthroscopy, partial medial menisectomy, patellar chondroplasty August Saucer) 09/2008--colonoscopy--polyps, benign  Family History: Reviewed history from 07/02/2007 and no changes required. Father: Alive 58, chol, HTN--died 04/08/99--complications of lung surg for lung cancer Mother: Alive 38, cholesterolemia, DM, obesity, HTN--died 08/23/06--? MI Siblings: 2 brothers, both with high cholesterol, HBP 2 sisters, one with HTN and high cholesterol, the other high cholesterol and lung cancer   Uncle died of unknown CA, age 33 CV:  (-) HBP:  (+) mother, father,  DM:  (+) mother Stroke:  (-)  Social History: Reviewed history from 10/02/2007 and no changes required. Former Smoker, 2 PPD since age 67, quit 10/98  Alcohol use-yes Drug  use-no Regular exercise-no Marital Status: Married x 17 years, divorced, lives alone Children: 34 yo daughter, 2 yo step-son Occupation: Works at [employer]  Review of Systems       ROS: GEN: No acute illnesses, no fevers, chills, sweats, fatigue, weight loss, or URI sx. GI: No n/v/d Pulm: No SOB, cough, wheezing Interactive and getting along well at home.  Otherwise, ROS is as per the HPI.   Physical Exam  Additional Exam:  GEN: WDWN, NAD, Non-toxic, A & O x  3 HEENT: Atraumatic, Normocephalic. Neck supple. No masses, No LAD. Ears and Nose: No external deformity. CV: RRR, No M/G/R. No JVD. No thrill. No extra heart sounds. PULM: CTA B, no wheezes, crackles, rhonchi. No retractions. No resp. distress. No accessory muscle use. EXTR: No c/c/e NEURO: Normal gait.  PSYCH: Normally interactive. Conversant. Not depressed or anxious appearing.  Calm demeanor.     Impression & Recommendations:  Problem # 1:  HYPERTENSION (ICD-401.9) Assessment Unchanged  His updated medication list for this problem includes:    Lisinopril 40 Mg Tabs (Lisinopril) .Marland Kitchen... 1 once daily fo hbp    Norvasc 5 Mg Tabs (Amlodipine besylate) .Marland Kitchen... 1 once daily for bp by mouth    Furosemide 40 Mg Tabs (Furosemide) .Marland Kitchen... Take one by mouth two times a day as needed  BP today: 120/78 Prior BP: 126/74 (02/17/2009)  Labs Reviewed: K+: 4.8 (02/21/2009) Creat: : 0.8 (02/21/2009)   Chol: 179 (01/10/2009)   HDL: 67.20 (01/10/2009)   LDL: 88 (01/10/2009)   TG: 119.0 (01/10/2009)  Problem # 2:  HYPERLIPIDEMIA (ICD-272.4) Assessment: Unchanged  His updated medication list for this problem includes:    Lipitor 80 Mg Tabs (Atorvastatin calcium) .Marland Kitchen... 1 by mouth daily  Problem # 3:  OSTEOARTHRITIS, KNEE, RIGHT (ICD-715.96) Assessment: Unchanged  His updated medication list for this problem includes:    Adult Aspirin Ec Low Strength 81 Mg Tbec (Aspirin) .Marland Kitchen... Take 1 tablet by mouth once a day    Etodolac 400 Mg Tabs (Etodolac) .Marland Kitchen... Take one by mouth two times a day  Discussed strengthening exercises, use of ice or heat, and medications.   Problem # 4:  ANXIETY (ICD-300.00) Assessment: Unchanged  His updated medication list for this problem includes:    Sertraline Hcl 50 Mg Tabs (Sertraline hcl) .Marland Kitchen... 1 by mouth daily  Discussed medication use    Complete Medication List: 1)  Prilosec 20 Mg Cpdr (Omeprazole) .... Once daily 2)  One-daily Multivitamins Tabs (Multiple  vitamin) .... Once daily 3)  Lipitor 80 Mg Tabs (Atorvastatin calcium) .Marland Kitchen.. 1 by mouth daily 4)  Sertraline Hcl 50 Mg Tabs (Sertraline hcl) .Marland Kitchen.. 1 by mouth daily 5)  Adult Aspirin Ec Low Strength 81 Mg Tbec (Aspirin) .... Take 1 tablet by mouth once a day 6)  Lisinopril 40 Mg Tabs (Lisinopril) .Marland Kitchen.. 1 once daily fo hbp 7)  Norvasc 5 Mg Tabs (Amlodipine besylate) .Marland Kitchen.. 1 once daily for bp by mouth 8)  Etodolac 400 Mg Tabs (Etodolac) .... Take one by mouth two times a day 9)  Furosemide 40 Mg Tabs (Furosemide) .... Take one by mouth two times a day as needed 10)  Voltaren 1 % Gel (Diclofenac sodium) .... Apply to knee 4 times daily 11)  Fish Oil Oil (Fish oil) .... Otc as directed. 12)  Garlic Oil 500 Mg Tabs (Garlic)  Patient Instructions: 1)  end of july - aug for CPX 2)  Prephysical Labs, several days before, fasting 3)  BMP, HFP,  FLP, CBC with diff, TSH, PSA: 272.4, v77.1, ,780.79, v76.44  Prescriptions: ETODOLAC 400 MG TABS (ETODOLAC) take one by mouth two times a day  #60 x 3   Entered and Authorized by:   Hannah Beat MD   Signed by:   Hannah Beat MD on 05/26/2009   Method used:   Electronically to        [facility]* (retail)       [address]       [place], [place]  [address]       Ph: [phone]       Fax: [phone]   RxID:   [id number] LIPITOR 80 MG  TABS (ATORVASTATIN CALCIUM) 1 by mouth daily  #30 x 11   Entered and Authorized by:   Hannah Beat MD   Signed by:   Hannah Beat MD on 05/26/2009   Method used:   Electronically to        [facility]* (retail)       [address]       [place], [place]  [address]       Ph: [phone]       Fax: [phone]   RxID:   [id number] SERTRALINE HCL 50 MG  TABS (SERTRALINE HCL) 1 by mouth daily  #30 x 11   Entered and Authorized by:   Hannah Beat MD   Signed by:   Hannah Beat MD on 05/26/2009   Method used:   Electronically to        [facility]* (retail)       [address]       [place], [place]  [address]       Ph: [phone]       Fax: [phone]   RxID:   [id number] LISINOPRIL 40 MG TABS (LISINOPRIL) 1 once daily fo HBP  #30 x 11   Entered and Authorized by:   Hannah Beat MD   Signed by:   Hannah Beat MD on 05/26/2009   Method used:   Electronically to        [facility]* (retail)       [address]       [place], [place]  [address]       Ph: [phone]       Fax: [phone]   RxID:   [id number]   Current Allergies (reviewed today): No known allergies

## 2010-05-04 NOTE — Assessment & Plan Note (Signed)
Summary: cold symptoms/alc   Vital Signs:  Patient profile:   65 year old male Height:      70.5 inches Weight:      245.0 pounds BMI:     34.78 O2 Sat:      98 % Temp:     98.5 degrees F oral Pulse rate:   84 / minute Pulse rhythm:   regular Resp:     18 per minute BP sitting:   120 / 78  (left arm) Cuff size:   large  Vitals Entered By: Benny Lennert CMA Duncan Dull) (March 31, 2010 8:54 AM)  History of Present Illness: Chief complaint cold symptoms  Has history of ? mild COPD.. not on inhalers.  Former smoker.  Does have some shortness of breath at baseline.   Acute Visit History:      The patient complains of cough and nasal discharge.  These symptoms began 1 week ago.  He denies earache, fever, sinus problems, and sore throat.  Other comments include: ribs hurt with constant coughing. .        The patient notes wheezing.  The cough interferes with his sleep.  The character of the cough is described as productive, yellow.  There is no history of shortness of breath associated with his cough.        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 Once Daily 2)  One-Daily  Multivitamins   Tabs (Multiple Vitamin) .... Once Daily 3)  Lipitor 80 Mg  Tabs (Atorvastatin Calcium) .Marland Kitchen.. 1 By Mouth Daily 4)  Sertraline Hcl 50 Mg  Tabs (Sertraline Hcl) .Marland Kitchen.. 1 By Mouth Daily 5)  Adult Aspirin Ec Low Strength 81 Mg  Tbec (Aspirin) .... Take 1 Tablet By Mouth Once A Day 6)  Lisinopril 40 Mg Tabs (Lisinopril) .Marland Kitchen.. 1 Once Daily Fo Hbp 7)  Norvasc 5 Mg Tabs (Amlodipine Besylate) .Marland Kitchen.. 1 Once Daily For Bp By Mouth 8)  Etodolac 400 Mg Tabs (Etodolac) .... Take One By Mouth Two Times A Day 9)  Furosemide 40 Mg Tabs (Furosemide) .... Take One By Mouth Two Times A Day As Needed 10)  Voltaren 1 % Gel (Diclofenac Sodium) .... Apply To Knee 4 Times Daily 11)  Fish Oil   Oil (Fish Oil) .... Otc As Directed. 12)  Garlic Oil 500 Mg Tabs (Garlic) 13)  Prednisone 20 Mg Tabs (Prednisone) .Marland Kitchen.. 1 By Mouth Once Daily With Food For 5 Days . 14)  Vicodin 5-500 Mg Tabs (Hydrocodone-Acetaminophen) .Marland Kitchen.. 1-2 By Mouth Three Times A Day As Needed For Pain, Sedation Caution  Allergies (verified): No Known Drug Allergies  Past History:  Past medical, surgical, family  and social histories (including risk factors) reviewed, and no changes noted (except as noted below).  Past Medical History: Reviewed history from 05/26/2009 and no changes required. Current Problems:  VENTRAL HERNIA (ICD-553.20) OSTEOARTHRITIS, KNEE, RIGHT (ICD-715.96) COPD (ICD-496) ALLERGIC RHINITIS (ICD-477.9) Hx of TOBACCO ABUSE (ICD-305.1) OBESITY (ICD-278.00) ANXIETY (ICD-300.00) SCREENING FOR MALIGNANT NEOPLASM, PROSTATE (ICD-V76.44) LIPOMA, BACK (ICD-214.9) CARPAL TUNNEL SYNDROME, BILATERAL (ICD-354.0) ERECTILE DYSFUNCTION (ICD-302.72) ANGINA, HX OF (ICD-V12.50) HYPERTENSION (ICD-401.9) HYPERLIPIDEMIA (ICD-272.4) GERD (ICD-530.81) COLONIC POLYPS, HX OF (ICD-V12.72) SKIN CANCER, HX OF (ICD-V10.83)    Past Surgical History: Reviewed history from 05/26/2009 and no changes required. 03-21-2009: R TKR, Dr. Milinda Antis 5/99        ETT (Dr. Kathie Rhodes         ) 11/01      Limited CT L/S area - sugg. MR 6/03        EMG on CS - Bilateral carpal tunnel 02/24/03 MRI L/S  L5-S1 narrow-- disc bulg L4-5 greater than L3-4 3/05        Right thumb - bone spurs 03/21/05 _________ Left forearm, Left wrist ______ on arthropathy ____________ 5/07        Colonoscopy R shoulder surg to remove bone spurs--03/24/07--Dean 6/09        R arthroscopy, partial medial menisectomy, patellar chondroplasty August Saucer) 09/2008--colonoscopy--polyps, benign  Family History: Reviewed history from 07/02/2007 and no changes required. Father: Alive 28, chol, HTN--died 1999/03/22--complications of lung surg for lung cancer Mother: Alive 110, cholesterolemia, DM, obesity, HTN--died 08-06-06--? MI Siblings: 2 brothers, both with high cholesterol, HBP 2 sisters, one with HTN and high cholesterol, the other high cholesterol and lung cancer   Uncle died of unknown CA, age 19 CV:  (-) HBP:  (+) mother, father,  DM:  (+) mother Stroke:  (-)  Social History: Reviewed history from 10/02/2007 and no changes required. Former Smoker, 2 PPD since age 71, quit 10/98  Alcohol use-yes Drug use-no Regular exercise-no Marital Status: Married x 17 years, divorced, lives alone Children: 42 yo daughter, 51 yo step-son Occupation: Works at [employer]  Review of Systems General:  Denies fatigue. CV:  Denies chest pain or discomfort. GI:  Denies abdominal pain; ? hernia in central abdomen. GU:  Denies dysuria. Derm:  Denies rash.  Physical Exam  General:  obese appearing male in NAD Head:  no maxillary sinus ttp Eyes:  No corneal or conjunctival inflammation noted. EOMI. Perrla. Funduscopic exam benign, without hemorrhages, exudates or papilledema. Vision grossly normal. Ears:  clear fluid B TMs Nose:  External nasal examination shows no deformity or inflammation. Nasal mucosa are pink and moist without lesions or exudates. Mouth:   Oral mucosa and oropharynx without lesions or exudates.  Teeth in good repair. Neck:  no carotid bruit or thyromegaly no cervical or supraclavicular lymphadenopathy  Lungs:  Normal respiratory effort, chest expands symmetrically. Lungs are clear to auscultation, no crackles or wheezes. Heart:  Normal rate and regular rhythm. S1 and S2 normal without gallop, murmur, click, rub or other extra sounds. Abdomen:  pt requested eval of abdominal hernia, asymptomatic .Marland Kitchen...ventral hernia, or diastasis recti...pt okay to exercise , unless pain occuring. Pulses:  R and L posterior tibial pulses are full and equal bilaterally    Impression & Recommendations:  Problem # 1:  CHRONIC OBSTRUCTIVE PULMONARY DISEASE, ACUTE EXACERBATION (ICD-491.21) Assessment New Mild.. no current wheezing. Will treat with antibitoics given sputum change. Add mucinex to regimen. If not improving in 48-72 hours or in increase in SOB.Marland Kitchen. start  steroid course.   Problem # 2:  COPD (ICD-496) Has not have PFTs in quite sometme. Uncelar baseline or if need for spiriva etc.  Follow up with primary MD for eval of breathing.   Problem # 3:  DIASTASIS RECTI (ICD-728.84) vs asymptomatic ventral hernia. Okay to exercise. No surgical eval needed.  Weight loss indicated.   Complete Medication List: 1)  Prilosec 20 Mg Cpdr (Omeprazole) .... Once daily 2)  One-daily Multivitamins Tabs (Multiple vitamin) .... Once daily 3)  Lipitor 80 Mg Tabs (Atorvastatin calcium) .Marland Kitchen.. 1 by mouth daily 4)  Sertraline Hcl 50 Mg Tabs (Sertraline hcl) .Marland Kitchen.. 1 by mouth daily 5)  Adult Aspirin Ec Low Strength 81 Mg Tbec (Aspirin) .... Take 1 tablet by mouth once a day 6)  Lisinopril 40 Mg Tabs (Lisinopril) .Marland Kitchen.. 1 once daily fo hbp 7)  Norvasc 5 Mg Tabs (Amlodipine besylate) .Marland Kitchen.. 1 once daily for bp by mouth 8)  Etodolac 400 Mg Tabs (Etodolac) .... Take one by mouth two times a day 9)  Furosemide 40 Mg Tabs (Furosemide) .... Take one by mouth two times a day  as needed 10)  Voltaren 1 % Gel (Diclofenac sodium) .... Apply to knee 4 times daily 11)  Fish Oil Oil (Fish oil) .... Otc as directed. 12)  Garlic Oil 500 Mg Tabs (Garlic) 13)  Prednisone 20 Mg Tabs (Prednisone) .... 3 tabs by mouth daily x 3 days, then 2 tabs by mouth daily x 2 days then 1 tab by mouth daily x 2 days 14)  Vicodin 5-500 Mg Tabs (Hydrocodone-acetaminophen) .Marland Kitchen.. 1-2 by mouth three times a day as needed for pain, sedation caution 15)  Azithromycin 250 Mg Tabs (Azithromycin) .... 2 tab by mouth x1 then 1 tab by mouth daily  Patient Instructions: 1)  Treat with antibitoics.  Add mucinex (no decongestant) to regimen. 2)  If not improving in 48-72 hours or in increase in SOB.Marland Kitchen. start  steroid course.  3)  Follow up with primary MD if shortness of breath when back to baseline for lung function test eval. Prescriptions: PREDNISONE 20 MG TABS (PREDNISONE) 3 tabs by mouth daily x 3 days, then 2 tabs by mouth daily x 2 days then 1 tab by mouth daily x 2 days  #15 x 0   Entered and Authorized by:   Kerby Nora MD   Signed by:   Kerby Nora MD on 03/31/2010   Method used:   Print then Give to Patient   RxID:   [id number] AZITHROMYCIN 250 MG TABS (AZITHROMYCIN) 2 tab by mouth x1 then 1 tab by mouth daily  #6 x 0   Entered and Authorized by:   Kerby Nora MD   Signed by:   Kerby Nora MD on 03/31/2010   Method used:   Electronically to        [facility]* (retail)       [address]       [place], [place]  [address]       Ph: [phone]       Fax: [phone]   RxID:   [id number]    Orders Added: 1)  Est. Patient Level IV [[address]]    Current Allergies (reviewed today): No known allergies

## 2010-05-04 NOTE — Assessment & Plan Note (Signed)
Summary: BACK PAIN/CLE   Vital Signs:  Patient profile:   65 year old male Height:      70.5 inches Weight:      244.25 pounds BMI:     34.68 Temp:     97.9 degrees F oral Pulse rate:   84 / minute Pulse rhythm:   regular BP sitting:   130 / 74  (left arm) Cuff size:   large  Vitals Entered By: Delilah Shan CMA Duncan Dull) (January 06, 2010 2:09 PM) CC: Back pain.  N & V yesterday   History of Present Illness: Last Saturday patient was helping out at [facility].  Back started hurting by that Monday.  H/o back surgery in 12/89.  Now with pain radiating down into L leg.  L lower back pain and in buttock.  "Feels like a lightning bolt."  H/o ruptured disk previously.  No h/o DM but occ borderline glucose readings.  Pain 6-7/10.  Had taken hydrocodone prev for knee pain. Sitting is worse than walking.  Able to bear weight, no incontinence.   Vomited yesterday from the pain.  temp max 99.6 on Tuesday.  No fevers today and no vomiting today.  "I'm good today for that."  No diarrhea.    Allergies: No Known Drug Allergies  Review of Systems       See HPI.  Otherwise negative.    Physical Exam  General:  NAD NCAT MMM  RRR CTAB back w/o mildline pain.  L lower back tender to palpation, SLR pos but no pain with int/ext rotation of hips.  distally nv intact.    Impression & Recommendations:  Problem # 1:  BACK PAIN (ICD-724.5) L sciatica.  d/w patient [id number] and GI precautions on steroids.  use pain meds in meantime and call back as needed.  No indication for imaging. He understood.  He may need physical therapy.  Okay for outpatient follow up.  His updated medication list for this problem includes:    Adult Aspirin Ec Low Strength 81 Mg Tbec (Aspirin) .Marland Kitchen... Take 1 tablet by mouth once a day    Etodolac 400 Mg Tabs (Etodolac) .Marland Kitchen... Take one by mouth two times a day    Vicodin 5-500 Mg Tabs (Hydrocodone-acetaminophen) .Marland Kitchen... 1-2 by mouth three times a day as needed for pain, sedation  caution  Complete Medication List: 1)  Prilosec 20 Mg Cpdr (Omeprazole) .... Once daily 2)  One-daily Multivitamins Tabs (Multiple vitamin) .... Once daily 3)  Lipitor 80 Mg Tabs (Atorvastatin calcium) .Marland Kitchen.. 1 by mouth daily 4)  Sertraline Hcl 50 Mg Tabs (Sertraline hcl) .Marland Kitchen.. 1 by mouth daily 5)  Adult Aspirin Ec Low Strength 81 Mg Tbec (Aspirin) .... Take 1 tablet by mouth once a day 6)  Lisinopril 40 Mg Tabs (Lisinopril) .Marland Kitchen.. 1 once daily fo hbp 7)  Norvasc 5 Mg Tabs (Amlodipine besylate) .Marland Kitchen.. 1 once daily for bp by mouth 8)  Etodolac 400 Mg Tabs (Etodolac) .... Take one by mouth two times a day 9)  Furosemide 40 Mg Tabs (Furosemide) .... Take one by mouth two times a day as needed 10)  Voltaren 1 % Gel (Diclofenac sodium) .... Apply to knee 4 times daily 11)  Fish Oil Oil (Fish oil) .... Otc as directed. 12)  Garlic Oil 500 Mg Tabs (Garlic) 13)  Prednisone 20 Mg Tabs (Prednisone) .Marland Kitchen.. 1 by mouth once daily with food for 5 days . 14)  Vicodin 5-500 Mg Tabs (Hydrocodone-acetaminophen) .Marland Kitchen.. 1-2 by mouth three times  a day as needed for pain, sedation caution  Patient Instructions: 1)  Take the prednisone with food.  Don't take the etodolac while you are on the prednisone.  The vicodin can make you drowsy.  It already has tylenol in it, so don't take extra tylenol.  Call us if you aren't feeling better next week.  Work on stretching your back.  Prescriptions: VICODIN 5-500 MG TABS (HYDROCODONE-ACETAMINOPHEN) 1-2 by mouth three times a day as needed for pain, sedation caution  #30 x 1   Entered and Authorized by:   Crawford Givens MD   Signed by:   Crawford Givens MD on 01/06/2010   Method used:   Print then Give to Patient   RxID:   [id number] PREDNISONE 20 MG TABS (PREDNISONE) 1 by mouth once daily with food for 5 days .  #5 x 0   Entered and Authorized by:   Crawford Givens MD   Signed by:   Crawford Givens MD on 01/06/2010   Method used:   Print then Give to Patient   RxID:    [id number]   Current Allergies (reviewed today): No known allergies

## 2010-05-04 NOTE — Progress Notes (Signed)
Summary: refill request for etodolac  Phone Note Refill Request Message from:  Fax from Pharmacy  Refills Requested: Medication #1:  ETODOLAC 400 MG TABS take one by mouth two times a day   Last Refilled: 11/25/2009 Faxed request from [facility].  Initial call taken by: Lowella Petties CMA, AAMA,  January 27, 2010 8:05 AM  Follow-up for Phone Call        Can wait for Chike Farrington's return Mon.  Follow-up by: Kerby Nora MD,  January 27, 2010 11:10 AM    Prescriptions: ETODOLAC 400 MG TABS (ETODOLAC) take one by mouth two times a day  #60 x 3   Entered and Authorized by:   Hannah Beat MD   Signed by:   Hannah Beat MD on 01/27/2010   Method used:   Electronically to        [facility]* (retail)       [address]       [place], [place]  [address]       Ph: [phone]       Fax: [phone]   RxID:   [id number]

## 2010-05-04 NOTE — Assessment & Plan Note (Signed)
Summary: PULLED MUSCLE AT WAISTLINE / LFW   Vital Signs:  Patient profile:   65 year old male Height:      70.5 inches Weight:      243.6 pounds BMI:     34.58 Temp:     98.7 degrees F oral Pulse rate:   72 / minute Pulse rhythm:   regular BP sitting:   130 / 90  (left arm) Cuff size:   regular  Vitals Entered By: Benny Lennert CMA Duncan Dull) (September 20, 2009 3:32 PM)  History of Present Illness: Chief complaint ? bladder infection  65 year old:  low back hurting, started about a week ago.  Taking some AZO.  Peeing frequently and burning when he urinates.  no penile discharge or concern or risk for STDs. Monogamous.  Also having some low back pain, history of spinal surgery. Some muscle spasm in the low back. No history of trauma or injury to his back he can recall.  Review systems as above. No fevers, chills, nausea.   GEN: WDWN, NAD, Non-toxic, A & O x 3 HEENT: Atraumatic, Normocephalic. Neck supple. No masses, No LAD. Ears and Nose: No external deformity. CV: RRR, No M/G/R. No JVD. No thrill. No extra heart sounds. PULM: CTA B, no wheezes, crackles, rhonchi. No retractions. No resp. distress. No accessory muscle use. EXTR: No c/c/e NEURO: Normal gait.  PSYCH: Normally interactive. Conversant. Not depressed or anxious appearing.  Calm demeanor.   MSK: range of motion of the back, flexion, extension, lateral bending and rotation full.  Negative straight leg raise. Mildly tender at the paraspinous region bilaterally. Nontender along the spinous processes.  Tender at the sacroiliac joints as well bilaterally.  Allergies (verified): No Known Drug Allergies  Past History:  Past medical, surgical, family and social histories (including risk factors) reviewed for relevance to current acute and chronic problems.  Past Medical History: Reviewed history from 05/26/2009 and no changes required. Current Problems:  VENTRAL HERNIA (ICD-553.20) OSTEOARTHRITIS, KNEE, RIGHT  (ICD-715.96) COPD (ICD-496) ALLERGIC RHINITIS (ICD-477.9) Hx of TOBACCO ABUSE (ICD-305.1) OBESITY (ICD-278.00) ANXIETY (ICD-300.00) SCREENING FOR MALIGNANT NEOPLASM, PROSTATE (ICD-V76.44) LIPOMA, BACK (ICD-214.9) CARPAL TUNNEL SYNDROME, BILATERAL (ICD-354.0) ERECTILE DYSFUNCTION (ICD-302.72) ANGINA, HX OF (ICD-V12.50) HYPERTENSION (ICD-401.9) HYPERLIPIDEMIA (ICD-272.4) GERD (ICD-530.81) COLONIC POLYPS, HX OF (ICD-V12.72) SKIN CANCER, HX OF (ICD-V10.83)    Past Surgical History: Reviewed history from 05/26/2009 and no changes required. 04/01/2009: R TKR, Dr. Milinda Antis 5/99        ETT (Dr. Kathie Rhodes         ) 11/01      Limited CT L/S area - sugg. MR 6/03        EMG on CS - Bilateral carpal tunnel 02/24/03 MRI L/S  L5-S1 narrow-- disc bulg L4-5 greater than L3-4 3/05        Right thumb - bone spurs 03/21/05 _________ Left forearm, Left wrist ______ on arthropathy ____________ 5/07        Colonoscopy R shoulder surg to remove bone spurs--03/24/07--Dean 6/09        R arthroscopy, partial medial menisectomy, patellar chondroplasty August Saucer) 09/2008--colonoscopy--polyps, benign  Family History: Reviewed history from 07/02/2007 and no changes required. Father: Alive 50, chol, HTN--died 04/02/99--complications of lung surg for lung cancer Mother: Alive 77, cholesterolemia, DM, obesity, HTN--died 17-Aug-2006--? MI Siblings: 2 brothers, both with high cholesterol, HBP 2 sisters, one with HTN and high cholesterol, the other high cholesterol and lung cancer   Uncle died of unknown CA, age 27 CV:  (-) HBP:  (+)  mother, father,  DM:  (+) mother Stroke:  (-)  Social History: Reviewed history from 10/02/2007 and no changes required. Former Smoker, 2 PPD since age 66, quit 10/98  Alcohol use-yes Drug use-no Regular exercise-no Marital Status: Married x 17 years, divorced, lives alone Children: 38 yo daughter, 72 yo step-son Occupation: Works at [employer]   Impression &  Recommendations:  Problem # 1:  DYSURIA (ICD-788.1) on AZO, urinalysis not accurate. No blood in urine. Not likely stone.  History more consistent with urinary tract infection and will treat as such. Culture urine.  Also treat for low back exacerbation,  range of motion, stretching,  ice and continue that as an anti-inflammatory.  His updated medication list for this problem includes:    Sulfamethoxazole-tmp Ds 800-160 Mg Tabs (Sulfamethoxazole-trimethoprim) .Marland Kitchen... 1 by mouth two times a day  Orders: T-Culture, Urine ([id number]) Specimen Handling (64332) UA Dipstick w/o Micro (manual) (81002)  Problem # 2:  BACK PAIN (ICD-724.5)  His updated medication list for this problem includes:    Adult Aspirin Ec Low Strength 81 Mg Tbec (Aspirin) .Marland Kitchen... Take 1 tablet by mouth once a day    Etodolac 400 Mg Tabs (Etodolac) .Marland Kitchen... Take one by mouth two times a day  Complete Medication List: 1)  Prilosec 20 Mg Cpdr (Omeprazole) .... Once daily 2)  One-daily Multivitamins Tabs (Multiple vitamin) .... Once daily 3)  Lipitor 80 Mg Tabs (Atorvastatin calcium) .Marland Kitchen.. 1 by mouth daily 4)  Sertraline Hcl 50 Mg Tabs (Sertraline hcl) .Marland Kitchen.. 1 by mouth daily 5)  Adult Aspirin Ec Low Strength 81 Mg Tbec (Aspirin) .... Take 1 tablet by mouth once a day 6)  Lisinopril 40 Mg Tabs (Lisinopril) .Marland Kitchen.. 1 once daily fo hbp 7)  Norvasc 5 Mg Tabs (Amlodipine besylate) .Marland Kitchen.. 1 once daily for bp by mouth 8)  Etodolac 400 Mg Tabs (Etodolac) .... Take one by mouth two times a day 9)  Furosemide 40 Mg Tabs (Furosemide) .... Take one by mouth two times a day as needed 10)  Voltaren 1 % Gel (Diclofenac sodium) .... Apply to knee 4 times daily 11)  Fish Oil Oil (Fish oil) .... Otc as directed. 12)  Garlic Oil 500 Mg Tabs (Garlic) 13)  Sulfamethoxazole-tmp Ds 800-160 Mg Tabs (Sulfamethoxazole-trimethoprim) .Marland Kitchen.. 1 by mouth two times a day Prescriptions: SULFAMETHOXAZOLE-TMP DS 800-160 MG TABS (SULFAMETHOXAZOLE-TRIMETHOPRIM) 1 by  mouth two times a day  #14 x 0   Entered and Authorized by:   Hannah Beat MD   Signed by:   Hannah Beat MD on 09/20/2009   Method used:   Electronically to        [facility]* (retail)       [address]       [place], [place]  [address]       Ph: [phone]       Fax: [phone]   RxID:   [id number]   Current Allergies (reviewed today): No known allergies   Laboratory Results   Urine Tests  Date/Time Received: September 20, 2009 3:36 PM  Date/Time Reported: September 20, 2009 3:36 PM   Routine Urinalysis   Color: yellow Appearance: Clear Glucose: negative   (Normal Range: Negative) Bilirubin: large   (Normal Range: Negative) Ketone: negative   (Normal Range: Negative) Spec. Gravity: 1.015   (Normal Range: 1.003-1.035) Blood: negative   (Normal Range: Negative) pH: 6.5   (Normal Range: 5.0-8.0) Protein: negative   (Normal Range: Negative) Urobilinogen: 0.2   (Normal Range: 0-1) Nitrite: negative   (  Normal Range: Negative) Leukocyte Esterace: negative   (Normal Range: Negative)

## 2010-08-18 NOTE — Op Note (Signed)
NAME:  Alan [name], Alan [name]                       ACCOUNT NO.:  [id number]   MEDICAL RECORD NO.:  [id number]                   PATIENT TYPE:  AMB   LOCATION:  DSC                                  FACILITY:  [facility]   PHYSICIAN:  Lowell Bouton, M.D.      DATE OF BIRTH:  February 06, 1946   DATE OF PROCEDURE:  06/09/2003  DATE OF DISCHARGE:                                 OPERATIVE REPORT   PREOPERATIVE DIAGNOSIS:  Carpal metacarpal arthritis right thumb.   POSTOPERATIVE DIAGNOSIS:  Carpal metacarpal arthritis right thumb.   PROCEDURE:  Excision of trapezium with FCR tendon interposition arthroplasty  and transfer to first metacarpal, right thumb.   SURGEON:  Lowell Bouton, M.D.   ANESTHESIA:  General.   FINDINGS:  The patient had significant CMC arthritis at the base of the  thumb.  The trapezium scaphoid space had mild arthritic changes.   DESCRIPTION OF PROCEDURE:  Under general anesthesia with a tourniquet on the  right arm, the right hand was prepped and draped in the usual sterile  fashion.  After exsanguinating the limb, the tourniquet was inflated to 350  mmHg.  A gently curved incision was made over the dorsum of the first  metacarpal proximally at the [facility] articulation.  Sharp dissection was carried  through the subcutaneous tissues and bleeding points were coagulated.  Sharp  dissection was carried between the extensor tendon of the extensor pollicis  brevis and the abductor pollicis longus.  The capsule of the trapezium was  opened sharply.  Care was taken to protect the dorsal branch of the radial  artery.  The trapezium was then divided into quarters with an osteotome and  a rongeur was used to remove the entire bone.  There were changes of  arthritis on the articular surface.  The scaphoid trapezium joint was mildly  arthritic.  After completely excising the trapezium, the FCR tendon was  harvested volarly.  Half of the tendon was taken on the ulnar side by  making  three transverse incisions in the mid forearm, in the distal forearm, and  over the wrist.  The ulnar half of the tendon was divided and was passed  from one incision to the other from proximal to distal.  The tendon was then  brought through the space between volar and dorsal at the base of the thumb.  Drill hole was made in the proximal first metacarpal from dorsal radial to  volar ulnar.  The tendon graft was then left attached distally and was  passed through the first metacarpal with a suture passer.  The tendon was  then sutured back onto itself around the metacarpal in the defect of the  trapezium using a 4-0 Mersilene suture.  The remainder of the tendon was  then rolled into an anchovy and was placed into the defect with 4-0  Mersilene.  The wound was then irrigated copiously with saline.  The  capsular tissue was closed with a 4-0  Mersilene and the skin was closed over  a vessel loop drain with a 3-0 subcuticular Prolene.  Steri-Strips were  applied.  The volar tendon wounds were closed with 4-0 nylon sutures.  Sterile dressings were applied followed by a thumb spica splint. The  tourniquet was released with good circulation of the hand.  The patient went  to the recovery room awake, stable, and in good condition.                                               Lowell Bouton, M.D.    EMM/MEDQ  D:  06/09/2003  T:  06/10/2003  Job:  [id number]

## 2010-08-18 NOTE — Op Note (Signed)
NAMECAMILLE, Alan [name]             ACCOUNT NO.:  [id number]   MEDICAL RECORD NO.:  [id number]          PATIENT TYPE:  AMB   LOCATION:  DSC                          FACILITY:  [facility]   PHYSICIAN:  Lowell Bouton, M.D.DATE OF BIRTH:  1945-11-16   DATE OF PROCEDURE:  03/21/2005  DATE OF DISCHARGE:                                 OPERATIVE REPORT   PREOPERATIVE DIAGNOSIS:  Carpometacarpal arthritis his left thumb with  lipoma, left forearm.   POSTOPERATIVE DIAGNOSIS:  Carpometacarpal arthritis his left thumb with  lipoma, left forearm.   PROCEDURES:  1.  Excision of lipoma left forearm.  2.  Excision of trapezium left wrist with FCR tendon arthroplasty left      thumb.   SURGEON:  Lowell Bouton, M.D.   ANESTHESIA:  General.   OPERATIVE FINDINGS:  The patient had a typical lipoma on the ulnar volar  aspect a left forearm. The scaphoid trapezial joint was relatively smooth  but the carpometacarpal joint of the thumb was completely destroyed. There  were large osteophytes.   PROCEDURE:  Under general anesthesia with a tourniquet on the left arm, the  left arm was prepped and draped in usual fashion.  After exsanguinating the  limb, the tourniquet was inflated to 250 mmHg. A longitudinal incision was  made over the mass in the left forearm and carried down through the  subcutaneous tissues. Bleeding points were coagulated. Blunt dissection was  carried down around the mass which appeared to be a lipoma. It was  completely excised. The wound was irrigated with saline and closed with a 4-  0 Vicryl and subcutaneous tissues and a 3-0 subcuticular Prolene in the  skin. Steri-Strips were applied. The thumb was then approached through a  gently curved dorsal and radial incision at the [facility] joint. Dissection was  carried down between the abductor pollicis longus and extensor pollicis  brevis. The capsule was then incised longitudinally at the [facility] joint. It was  elevated  off of the base of the first metacarpal. Retractors were then  inserted and the carpometacarpal joint was examined.  It was found to be  quite arthritic. The capsule laterally was then performed back to the  scaphotrapezial joint taking care to protect the dorsal branch of the radial  artery. The capsule was elevated and the ST joint was examined and was found  to be fairly smooth. The trapezium was then divided into quarters using an  osteotome and removed in a piecemeal fashion with a rongeur. There were  large osteophytes that were also removed and the capsular tissue was  maintained. After completely excising the trapezium, x-ray showed good space  between the first metacarpal and scaphoid. At this point, a drill hole was  made through the dorsal radial aspect of the first metacarpal proximally and  then drilled toward the ulnar volar base of the articular surface.  Successive drill bits were then used to enlarge the hole to pass the tendon  arthroplasty. At this point, half of the flexor carpi radialis tendon was  taken through three small transverse incisions in the volar forearm. The  ulnar half of the tendon was brought distally from proximal to distal and  left attached distally. It was then brought into the defect where the  trapezium had been excised. It was brought through the hole in the first  metacarpal using a Swanson suture passer. The tendon was then brought back  on itself and sutured into place with a 4-0 Mersilene suture. This produced  a nice sling effect for the first metacarpal. The remainder of the tendon  was then rolled up into an anchovy and secured with a 4-0 Mersilene suture.  It was packed into the defect of the trapezium. The capsular tissue was then  closed with 4-0 Vicryl and 0.5% Marcaine was inserted in the skin edges for  pain control. The wound was closed with 4-0 Vicryl in subcutaneous tissues  over a Vesi-loop drain.  The skin was closed with a 3-0  subcuticular  Prolene. Steri-Strips were applied. The volar wounds were then closed with 4-  0 nylon sutures. Sterile dressings were applied followed by a thumb spica  splint. The tourniquet was released with good circulation of the hand. The  patient went to the recovery room awake and stable in good condition.      Lowell Bouton, M.D.  Electronically Signed     EMM/MEDQ  D:  03/21/2005  T:  03/23/2005  Job:  [id number]   cc:   Cecil Cranker, M.D.  [address]  [address]  [place]  [place] [address]   Laurita Quint, M.D.  Fax: [phone]

## 2010-11-07 ENCOUNTER — Telehealth: Payer: Self-pay | Admitting: *Deleted

## 2010-11-07 MED ORDER — AMLODIPINE BESYLATE 5 MG PO TABS: 5.0000 mg | ORAL_TABLET | Freq: Every day | ORAL | Status: DC

## 2010-11-07 NOTE — Telephone Encounter (Signed)
Medication refilled

## 2010-11-24 ENCOUNTER — Encounter: Payer: Self-pay | Admitting: Family Medicine

## 2010-11-28 ENCOUNTER — Encounter: Payer: Self-pay | Admitting: Family Medicine

## 2010-11-28 ENCOUNTER — Ambulatory Visit (INDEPENDENT_AMBULATORY_CARE_PROVIDER_SITE_OTHER): Payer: BC Managed Care – PPO | Admitting: Family Medicine

## 2010-11-28 VITALS — BP 140/92 | HR 75 | Temp 98.6°F | Ht 72.0 in | Wt 235.4 lb

## 2010-11-28 DIAGNOSIS — Z0000 Encounter for general adult medical examination without abnormal findings: Secondary | ICD-10-CM

## 2010-11-28 DIAGNOSIS — E785 Hyperlipidemia, unspecified: Secondary | ICD-10-CM

## 2010-11-28 DIAGNOSIS — I10 Essential (primary) hypertension: Secondary | ICD-10-CM

## 2010-11-28 DIAGNOSIS — Z125 Encounter for screening for malignant neoplasm of prostate: Secondary | ICD-10-CM

## 2010-11-28 DIAGNOSIS — Z79899 Other long term (current) drug therapy: Secondary | ICD-10-CM

## 2010-11-28 LAB — BASIC METABOLIC PANEL
Chloride: 104 mEq/L (ref 96–112)
Creatinine, Ser: 0.6 mg/dL (ref 0.4–1.5)
GFR: 136.1 mL/min (ref >60.00)
Potassium: 5.4 mEq/L — ABNORMAL HIGH (ref 3.5–5.1)

## 2010-11-28 LAB — HEPATIC FUNCTION PANEL
ALT: 29 U/L (ref 0–53)
AST: 23 U/L (ref 0–37)
Bilirubin, Direct: 0.1 mg/dL (ref 0.0–0.3)
Total Protein: 7.1 g/dL (ref 6.0–8.3)

## 2010-11-28 LAB — CBC WITH DIFFERENTIAL/PLATELET
Basophils Relative: 0.8 % (ref 0.0–3.0)
Eosinophils Relative: 5.2 % — ABNORMAL HIGH (ref 0.0–5.0)
HCT: 43.2 % (ref 39.0–52.0)
MCV: 90.5 fl (ref 78.0–100.0)
Monocytes Absolute: 0.5 10*3/uL (ref 0.1–1.0)
Monocytes Relative: 9.4 % (ref 3.0–12.0)
Neutrophils Relative %: 54.4 % (ref 43.0–77.0)
RBC: 4.77 Mil/uL (ref 4.22–5.81)
WBC: 4.9 10*3/uL (ref 4.5–10.5)

## 2010-11-28 LAB — LIPID PANEL
Cholesterol: 188 mg/dL (ref 0–200)
HDL: 82.2 mg/dL (ref >39.00)
Total CHOL/HDL Ratio: 2
Triglycerides: 73 mg/dL (ref 0.0–149.0)

## 2010-11-28 MED ORDER — ATORVASTATIN CALCIUM 40 MG PO TABS: 40.0000 mg | ORAL_TABLET | Freq: Every day | ORAL | Status: DC

## 2010-11-28 NOTE — Progress Notes (Signed)
  Subjective:    [name], male    DOB: [birth year], 65 y.o.   MRN: [id number]  HPI  Alan [name], a 65 y.o. male presents today in the office for the following:    CPX  HM UTD  BP elevated today 138/75  Preventative Health Maintenance Visit:  Health Maintenance Summary Reviewed and updated, unless pt declines services.  Tobacco History Reviewed. Alcohol: No concerns, no excessive use Exercise Habits: Some activity, rec at least 30 mins 5 times a week (gym 2-3 days a week, o/w outdoor work) STD concerns: no risk or activity to increase risk Drug Use: None Encouraged self-testicular check  Health Maintenance  Topic Date Due  . Influenza Vaccine  01/01/2011  . Tetanus/tdap  06/12/2016  . Colonoscopy  10/05/2018  . Zostavax  Completed    Review of Systems General: Denies fever, chills, sweats. No significant weight loss. Eyes: Denies blurring,significant itching ENT: Denies earache, sore throat, and hoarseness. Cardiovascular: Denies chest pains, palpitations, dyspnea on exertion Respiratory: Denies cough, dyspnea at rest,wheeezing Breast: no concerns about lumps GI: Denies nausea, vomiting, diarrhea, constipation, change in bowel habits, abdominal pain, melena, hematochezia GU: Denies penile discharge, ED, urinary flow / outflow problems. No STD concerns. Musculoskeletal: Denies back pain, joint pain Derm: Denies rash, itching Neuro: Denies  paresthesias, frequent falls, frequent headaches Psych: Denies depression, anxiety Endocrine: Denies cold intolerance, heat intolerance, polydipsia Heme: Denies enlarged lymph nodes Allergy: No hayfever     Objective:   Physical Exam   Physical Exam  Blood pressure 140/92, pulse 75, temperature 98.6 F (37 C), temperature source Oral, height 6' (1.829 m), weight 235 lb 6.4 oz (106.777 kg), SpO2 99.00%.  PE: GEN: well developed, well nourished, no acute distress Eyes: conjunctiva and lids normal,  PERRLA, EOMI ENT: TM clear, nares clear, oral exam WNL Neck: supple, no lymphadenopathy, no thyromegaly, no JVD Pulm: clear to auscultation and percussion, respiratory effort normal CV: regular rate and rhythm, S1-S2, no murmur, rub or gallop, no bruits, peripheral pulses normal and symmetric, no cyanosis, clubbing, edema or varicosities Chest: no scars, masses, no gynecomastia   GI: soft, non-tender; no hepatosplenomegaly, masses; active bowel sounds all quadrants GU: no hernia, testicular mass, penile discharge, priapism or prostate enlargement Lymph: no cervical, axillary or inguinal adenopathy MSK: gait normal, muscle tone and strength WNL, no joint swelling, effusions, discoloration, crepitus  SKIN: clear, good turgor, color WNL, no rashes, lesions, or ulcerations Neuro: normal mental status, normal strength, sensation, and motion Psych: alert; oriented to person, place and time, normally interactive and not anxious or depressed in appearance.       Assessment & Plan:   HM: The patient's preventative maintenance and recommended screening tests for an annual wellness exam were reviewed in full today. Brought up to date unless services declined.  Counselled on the importance of diet, exercise, and its role in overall health and mortality. The patient's FH and SH was reviewed, including their home life, tobacco status, and drug and alcohol status.  Doing well  Change to branded lipitor per his request  Check f/u labs  1. Routine general medical examination at a health care facility    2. HYPERLIPIDEMIA  atorvastatin (LIPITOR) 40 MG tablet, Lipid panel  3. Special screening for malignant neoplasm of prostate  PSA  4. HYPERTENSION  Basic metabolic panel  5. Encounter for long-term (current) use of other medications  CBC with Differential, Hepatic function panel

## 2010-12-05 ENCOUNTER — Other Ambulatory Visit: Payer: Self-pay | Admitting: *Deleted

## 2010-12-05 NOTE — Telephone Encounter (Signed)
Not on medication list.

## 2010-12-07 ENCOUNTER — Encounter: Payer: Self-pay | Admitting: *Deleted

## 2010-12-07 MED ORDER — SERTRALINE HCL 50 MG PO TABS: 50.0000 mg | ORAL_TABLET | Freq: Every day | ORAL | Status: DC

## 2010-12-07 MED ORDER — LISINOPRIL 40 MG PO TABS: 40.0000 mg | ORAL_TABLET | Freq: Every day | ORAL | Status: DC

## 2011-02-02 ENCOUNTER — Telehealth: Payer: Self-pay | Admitting: Family Medicine

## 2011-02-02 NOTE — Telephone Encounter (Signed)
Pt returned call, says he is presently going to a fitness program, and he wants to get out of the program. He wants the physician to write a letter stating he can no longer participant in the program. Discussed w/ Herbert Seta, CMA. Recommended an office visit. Called pt left message.Apolinar Junes 02-02-2011

## 2011-03-06 ENCOUNTER — Other Ambulatory Visit: Payer: Self-pay | Admitting: *Deleted

## 2011-03-06 MED ORDER — AMLODIPINE BESYLATE 5 MG PO TABS: 5.0000 mg | ORAL_TABLET | Freq: Every day | ORAL | Status: DC

## 2011-03-06 MED ORDER — SERTRALINE HCL 50 MG PO TABS: 50.0000 mg | ORAL_TABLET | Freq: Every day | ORAL | Status: DC

## 2011-03-06 NOTE — Telephone Encounter (Signed)
Received faxed refill request from pharmacy. Is it okay to refill medication? 

## 2011-03-06 NOTE — Telephone Encounter (Signed)
done

## 2011-03-09 ENCOUNTER — Encounter: Payer: Self-pay | Admitting: Family Medicine

## 2011-04-04 ENCOUNTER — Ambulatory Visit (INDEPENDENT_AMBULATORY_CARE_PROVIDER_SITE_OTHER)
Admission: RE | Admit: 2011-04-04 | Discharge: 2011-04-04 | Disposition: A | Discharge status: Home / Self Care | Payer: BC Managed Care – PPO | Source: Ambulatory Visit | Attending: Family Medicine | Admitting: Family Medicine

## 2011-04-04 ENCOUNTER — Ambulatory Visit
Admission: RE | Admit: 2011-04-04 | Discharge: 2011-04-04 | Disposition: A | Discharge status: Home / Self Care | Payer: BC Managed Care – PPO | Source: Ambulatory Visit | Attending: Family Medicine | Admitting: Family Medicine

## 2011-04-04 ENCOUNTER — Ambulatory Visit (INDEPENDENT_AMBULATORY_CARE_PROVIDER_SITE_OTHER): Payer: BC Managed Care – PPO | Admitting: Family Medicine

## 2011-04-04 ENCOUNTER — Encounter: Payer: Self-pay | Admitting: Family Medicine

## 2011-04-04 VITALS — BP 130/72 | HR 71 | Temp 97.6°F | Ht 72.0 in | Wt 249.1 lb

## 2011-04-04 DIAGNOSIS — M1710 Unilateral primary osteoarthritis, unspecified knee: Secondary | ICD-10-CM

## 2011-04-04 DIAGNOSIS — IMO0002 Reserved for concepts with insufficient information to code with codable children: Secondary | ICD-10-CM

## 2011-04-04 DIAGNOSIS — M25569 Pain in unspecified knee: Secondary | ICD-10-CM

## 2011-04-04 MED ORDER — DICLOFENAC SODIUM 75 MG PO TBEC: 75.0000 mg | DELAYED_RELEASE_TABLET | Freq: Two times a day (BID) | ORAL | Status: DC

## 2011-04-04 NOTE — Patient Instructions (Signed)
Recheck in 1 month 

## 2011-04-04 NOTE — Progress Notes (Addendum)
  Patient Name: Alan [name] Date of Birth: 1946-01-07 Age: 66 y.o. Medical Record Number: [id number] Gender: male Date of Encounter: 04/04/2011  History of Present Illness:  Alan [name] is a 66 y.o. 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 Fell on the left side. No fluid but pain on the medial aspect of his left knee. The last time we obtained films on his left knee, the patient did have some mild osteoarthritic changes. Is not having any symptomatic giving way or locking up.  2 years ago had his knee replaced on the R. this was done by Dr. Ernest Pine. The patient also had a prior very large partial meniscectomy on the right done previously by Dr. August Saucer in [place]. The right knee is also hurting somewhat has a small effusion.  Past Medical History, Surgical History, Social History, Family History, Problem List, Medications, and Allergies have been reviewed and updated if relevant.  Review of Systems:  GEN: No fevers, chills. Nontoxic. Primarily MSK c/o today. MSK: Detailed in the HPI GI: tolerating PO intake without difficulty Neuro: No numbness, parasthesias, or tingling associated. Otherwise the pertinent positives of the ROS are noted above.    Physical Examination: Filed Vitals:   04/04/11 1517  BP: 130/72  Pulse: 71  Temp: 97.6 F (36.4 C)  TempSrc: Oral  Height: 6' (1.829 m)  Weight: 249 lb 1.9 oz (113 kg)  SpO2: 98%    Body mass index is 33.79 kg/(m^2).   GEN: WDWN, NAD, Non-toxic, Alert & Oriented x 3 HEENT: Atraumatic, Normocephalic.  Ears and Nose: No external deformity. EXTR: No clubbing/cyanosis/edema NEURO: Normal gait.  PSYCH: Normally interactive. Conversant. Not depressed or anxious appearing.  Calm demeanor.   Knee:  B Gait: Normal heel toe pattern ROM: 0-125 Effusion: mild on R Echymosis or edema: none Patellar tendon NT Painful PLICA: neg Patellar  grind: negative Medial and lateral patellar facet loading: negative medial and lateral joint lines: mild TTP on L, medial Mcmurray's neg Flexion-pinch neg Varus and valgus stress: stable Lachman: neg Ant and Post drawer: neg Hip abduction, IR, ER: WNL Hip flexion str: 5/5 Hip abd: 5/5 Quad: 5/5 VMO atrophy:No Hamstring concentric and eccentric: 5/5   Assessment and Plan: 1. Knee pain  DG Knee 1-2 Views Left, DG Knee 1-2 Views Right, DG Knee Bilateral Standing AP  2. OSTEOARTHRITIS, KNEE, RIGHT      X-rays, bilateral: Mild degree of medial compartmental osteoarthritis on the left. No occult fracture.  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  Probable left-sided osteoarthritic flare. Oral diclofenac, and intra-articular corticosteroid injection on the left.  Knee Injection, LEFT Patient verbally consented to procedure. Risks (including potential rare risk of infection), benefits, and alternatives explained. Sterilely prepped with Chloraprep. Ethyl cholride used for anesthesia. 9 cc Lidocaine 1% mixed with 1 cc of Depo-Medrol 40 mg injected using the anterolateral approach without difficulty. No complications with procedure and tolerated well. Patient had decreased pain post-injection.   Patient also given cialis sample for intermittent ED

## 2011-04-09 ENCOUNTER — Other Ambulatory Visit: Payer: Self-pay | Admitting: *Deleted

## 2011-04-09 MED ORDER — LISINOPRIL 40 MG PO TABS: 40.0000 mg | ORAL_TABLET | Freq: Every day | ORAL | Status: DC

## 2011-05-04 ENCOUNTER — Telehealth: Payer: Self-pay | Admitting: Family Medicine

## 2011-05-04 NOTE — Telephone Encounter (Signed)
Patient cancelled his follow up appointment for next week.  He wanted to let you know his knee is feeling better.

## 2011-05-09 ENCOUNTER — Ambulatory Visit: Payer: BC Managed Care – PPO | Admitting: Family Medicine

## 2011-07-06 ENCOUNTER — Ambulatory Visit (INDEPENDENT_AMBULATORY_CARE_PROVIDER_SITE_OTHER): Payer: Medicare Other | Admitting: Internal Medicine

## 2011-07-06 ENCOUNTER — Encounter: Payer: Self-pay | Admitting: Internal Medicine

## 2011-07-06 VITALS — BP 128/80 | HR 94 | Temp 98.3°F | Resp 16 | Wt 255.0 lb

## 2011-07-06 DIAGNOSIS — J449 Chronic obstructive pulmonary disease, unspecified: Secondary | ICD-10-CM

## 2011-07-06 DIAGNOSIS — J019 Acute sinusitis, unspecified: Secondary | ICD-10-CM | POA: Insufficient documentation

## 2011-07-06 MED ORDER — AMOXICILLIN 500 MG PO TABS: 1000.0000 mg | ORAL_TABLET | Freq: Two times a day (BID) | ORAL | Status: DC

## 2011-07-06 MED ORDER — BENZONATATE 200 MG PO CAPS: 200.0000 mg | ORAL_CAPSULE | Freq: Three times a day (TID) | ORAL | Status: AC | PRN

## 2011-07-06 NOTE — Assessment & Plan Note (Signed)
Seems to have cough secondary to drainage Sick for 2 weeks Will treat with amoxil Tessalon also

## 2011-07-06 NOTE — Assessment & Plan Note (Signed)
Spirometry actually is pretty normal Not a smoker now If cough persists to next week--would try empiric prednisone

## 2011-07-06 NOTE — Progress Notes (Signed)
Subjective:    [name], male    DOB: 1945/08/16, 66 y.o.   MRN: [id number]  HPI Has congestion and again 4th spell in last 6 months---since retiring  Started at least 2 weeks ago Waxes and wanes Noted pain with breathing in upper chest and back yesterday Breathing doesn't feel right in general  No fever Has had some sweats at night but no shaking chills Cough is mostly dry or clear fluid Some green/yellow stuff from nose last week Head is congested also No ear pain Throat is a bit sore from coughing  Chest is painful from the cough Has used OTC meds ---benedryl and dimetapp but not helping Cough is only occ affecting his sleep---seems worst early in AM  Had diagnosis of COPD Quit smoking 15 years ago  Current Outpatient Prescriptions on File Prior to Visit  Medication Sig Dispense Refill  . amLODipine (NORVASC) 5 MG tablet Take 1 tablet (5 mg total) by mouth daily.  30 tablet  7  . aspirin 81 MG tablet Take 81 mg by mouth daily.        Marland Kitchen atorvastatin (LIPITOR) 40 MG tablet Take 1 tablet (40 mg total) by mouth daily.  30 tablet  11  . diclofenac (VOLTAREN) 75 MG EC tablet Take 1 tablet (75 mg total) by mouth 2 (two) times daily.  60 tablet  3  . fish oil-omega-3 fatty acids 1000 MG capsule Take 2 g by mouth daily.        . Garlic Oil 500 MG TABS Take 1 tablet by mouth daily.        Marland Kitchen lisinopril (PRINIVIL,ZESTRIL) 40 MG tablet Take 1 tablet (40 mg total) by mouth daily.  30 tablet  6  . Multiple Vitamin (MULTIVITAMIN) tablet Take 1 tablet by mouth daily.        Marland Kitchen omeprazole (PRILOSEC) 20 MG capsule Take 20 mg by mouth daily.        . sertraline (ZOLOFT) 50 MG tablet Take 1 tablet (50 mg total) by mouth daily.  30 tablet  7    No Known Allergies  Past Medical History  Diagnosis Date  . Osteoarthrosis, unspecified whether generalized or localized, lower leg   . Chronic airway obstruction, not elsewhere classified   . Allergic rhinitis, cause  unspecified   . Lipoma of unspecified site   . Carpal tunnel syndrome   . Unspecified essential hypertension   . Other and unspecified hyperlipidemia   . Esophageal reflux   . Personal history of colonic polyps   . Personal history of other malignant neoplasm of skin     Past Surgical History  Procedure Date  . Total knee arthroplasty     03-2009 [facility]  . Bone spur 06-2003    right thumb  . Shoulder surgery 03-24-2007    removal bone spur  . Knee arthroscopy 09-2007    partial medial mesicecotmy, patellar chonfroplasty    Family History  Problem Relation Age of Onset  . Diabetes Mother   . Hyperlipidemia Mother   . Hypertension Mother   . Heart attack Mother   . Obesity Mother   . Hyperlipidemia Father   . Hypertension Father   . Cancer Father     lung  . Hypertension Sister   . Hyperlipidemia Sister   . Hyperlipidemia Brother   . Hypertension Brother   . Hyperlipidemia Brother   . Hypertension Brother   . Cancer Sister     lung  . Hyperlipidemia  Sister     History   Social History  . Marital Status: Married    Spouse Name: N/A    Number of Children: 2  . Years of Education: N/A   Occupational History  . [employer]    Social History Main Topics  . Smoking status: Former Smoker -- 2.0 packs/day for 30 years  . Smokeless tobacco: Never Used  . Alcohol Use: Yes  . Drug Use: No  . Sexually Active: Not on file   Other Topics Concern  . Not on file   Social History Narrative   Regular exercise -- no   Review of Systems No skin problems Appetite is okay No vomiting or diarrhea---but moving bowels more regularly over the past 6 weeks    Objective:   Physical Exam  Constitutional: He appears well-developed and well-nourished. No distress.  HENT:       Mild frontal but not maxillary tenderness Slight pharyngeal injection but not striking Moderate nasal inflammation TMs normal  Neck: Normal range of motion. Neck supple.        Thyroid palpable without clear nodule  Pulmonary/Chest: Breath sounds normal. No respiratory distress. He has no wheezes. He has no rales.       No dullness  Lymphadenopathy:    He has no cervical adenopathy.          Assessment & Plan:

## 2011-07-06 NOTE — Patient Instructions (Signed)
Please call next week if your cough is not much better

## 2011-07-16 ENCOUNTER — Encounter: Payer: Self-pay | Admitting: Family Medicine

## 2011-07-16 ENCOUNTER — Ambulatory Visit (INDEPENDENT_AMBULATORY_CARE_PROVIDER_SITE_OTHER): Payer: Medicare Other | Admitting: Family Medicine

## 2011-07-16 VITALS — BP 140/80 | HR 97 | Temp 99.1°F | Ht 72.0 in | Wt 249.1 lb

## 2011-07-16 DIAGNOSIS — J320 Chronic maxillary sinusitis: Secondary | ICD-10-CM

## 2011-07-16 DIAGNOSIS — J029 Acute pharyngitis, unspecified: Secondary | ICD-10-CM

## 2011-07-16 MED ORDER — AMOXICILLIN-POT CLAVULANATE 875-125 MG PO TABS: 1.0000 | ORAL_TABLET | Freq: Two times a day (BID) | ORAL | Status: AC

## 2011-07-16 NOTE — Progress Notes (Signed)
  Patient Name: Alan [name] Date of Birth: 1945/10/29 Age: 66 y.o. Medical Record Number: [id number] Gender: male Date of Encounter: 07/16/2011  History of Present Illness:  Alan [name] is a 66 y.o. very pleasant male patient who presents with the following:  1 month history of sore throat, sinus congestion and pain, now some cough and intermittent shortness of breath. He is coughing and having some production of sputum. His right ear is also hurting. Some pain in the right side of his axillary region as well as his teeth and behind his eye.  Past Medical History, Surgical History, Social History, Family History, Problem List, Medications, and Allergies have been reviewed and updated if relevant.  Review of Systems: ROS: GEN: Acute illness details above GI: Tolerating PO intake GU: maintaining adequate hydration and urination Pulm: No SOB Interactive and getting along well at home.  Otherwise, ROS is as per the HPI.   Physical Examination: Filed Vitals:   07/16/11 1231  BP: 140/80  Pulse: 97  Temp: 99.1 F (37.3 C)  TempSrc: Oral  Height: 6' (1.829 m)  Weight: 249 lb 1.9 oz (113 kg)  SpO2: 97%    Body mass index is 33.79 kg/(m^2).   Gen: WDWN, NAD; alert,appropriate and cooperative throughout exam  HEENT: Normocephalic and atraumatic. Throat clear, w/o exudate, no LAD, R TM clear, L TM - good landmarks, No fluid present. rhinnorhea.  Left frontal and maxillary sinuses: nonTender Right frontal and maxillary sinuses: Tender  Neck: No ant or post LAD CV: RRR, No M/G/R Pulm: Breathing comfortably in no resp distress. no w/c/r Abd: S,NT,ND,+BS Extr: no c/c/e Psych: full affect, pleasant   Assessment and Plan:   1. Chronic sinusitis of right maxilla    2. Sore throat  POCT rapid strep A   Mild COPD flare, ventolin prn  Rapid Strep A Screen  Date Value Range Status  07/16/2011 Negative  Negative (no units) Final   Orders Today: Orders Placed This  Encounter  Procedures  . POCT rapid strep A    Medications Today: Meds ordered this encounter  Medications  . amoxicillin-clavulanate (AUGMENTIN) 875-125 MG per tablet    Sig: Take 1 tablet by mouth 2 (two) times daily.    Dispense:  20 tablet    Refill:  0

## 2011-07-18 ENCOUNTER — Ambulatory Visit (INDEPENDENT_AMBULATORY_CARE_PROVIDER_SITE_OTHER): Payer: Medicare Other | Admitting: Family Medicine

## 2011-07-18 ENCOUNTER — Encounter: Payer: Self-pay | Admitting: Family Medicine

## 2011-07-18 VITALS — BP 150/92 | HR 97 | Temp 98.8°F | Ht 72.0 in | Wt 251.0 lb

## 2011-07-18 DIAGNOSIS — H60399 Other infective otitis externa, unspecified ear: Secondary | ICD-10-CM

## 2011-07-18 DIAGNOSIS — H938X9 Other specified disorders of ear, unspecified ear: Secondary | ICD-10-CM

## 2011-07-18 DIAGNOSIS — H6090 Unspecified otitis externa, unspecified ear: Secondary | ICD-10-CM

## 2011-07-18 DIAGNOSIS — R21 Rash and other nonspecific skin eruption: Secondary | ICD-10-CM

## 2011-07-18 MED ORDER — METHYLPREDNISOLONE ACETATE 40 MG/ML IJ SUSP
80.0000 mg | Freq: Once | INTRAMUSCULAR | Status: AC
  Administered 2011-07-18: 80 mg via INTRAMUSCULAR

## 2011-07-18 MED ORDER — CIPROFLOXACIN-DEXAMETHASONE 0.3-0.1 % OT SUSP: 4.0000 [drp] | Freq: Two times a day (BID) | OTIC | Status: AC

## 2011-07-18 NOTE — Progress Notes (Signed)
  Patient Name: Alan [name] Date of Birth: 13-Jun-1945 Age: 66 y.o. Medical Record Number: [id number] Gender: male Date of Encounter: 07/18/2011  History of Present Illness:  Alan [name] is a 66 y.o. very pleasant male patient who presents with the following:  A couple days of R exterior ear swelling, pain, pain in canal, and some scattered mildly painful spots on scalp. Non-vesicular.  Past Medical History, Surgical History, Social History, Family History, Problem List, Medications, and Allergies have been reviewed and updated if relevant.  Review of Systems: Recent sinusitis, improving  Physical Examination: Filed Vitals:   07/18/11 1504  BP: 150/92  Pulse: 97  Temp: 98.8 F (37.1 C)  TempSrc: Oral  Height: 6' (1.829 m)  Weight: 251 lb (113.853 kg)  SpO2: 97%    Body mass index is 34.04 kg/(m^2).   GEN: WDWN, NAD, Non-toxic, Alert & Oriented x 3 HEENT: Atraumatic, Normocephalic.  Ears and Nose: No external deformity. R TM clear, tragus TTP, some swelling in the ear itself. EXTR: No clubbing/cyanosis/edema NEURO: Normal gait.  PSYCH: Normally interactive. Conversant. Not depressed or anxious appearing.  Calm demeanor.  SKIN: minimally visualized color change, no vesicles, minimally tender to palpation on scalp.  Assessment and Plan: 1. Otitis externa  ciprofloxacin-dexamethasone (CIPRODEX) otic suspension, methylPREDNISolone acetate (DEPO-MEDROL) injection 80 mg  2. Ear swelling    3. Rash      R ear seems like bad OE with swelling at ear. Ciprodex + IM steroids now for swelling  ? Rash on scalp, appears non-infectious. Steroids will help any dermatitis. Doubt shingles.

## 2011-07-20 ENCOUNTER — Telehealth: Payer: Self-pay

## 2011-07-20 DIAGNOSIS — H9202 Otalgia, left ear: Secondary | ICD-10-CM | POA: Insufficient documentation

## 2011-07-20 DIAGNOSIS — H9209 Otalgia, unspecified ear: Secondary | ICD-10-CM

## 2011-07-20 NOTE — Telephone Encounter (Signed)
Pt saw Dr Patsy Lager x 2 this week; pt said rt ear very painful pain level 9,throat sore on rt side.pt taking Augmentin 875-125, Ciprodex,and had Depomedrol injection this week. Dr Patsy Lager is out of office and Dr Milinda Antis said would do ENT referral.Pt can be reached at [phone] uses [employer] and Shirlee Limerick given info. Pt notified while on phone and Shirlee Limerick said she would talk with pt now.

## 2011-07-31 ENCOUNTER — Encounter: Payer: Self-pay | Admitting: Family Medicine

## 2011-07-31 ENCOUNTER — Ambulatory Visit (INDEPENDENT_AMBULATORY_CARE_PROVIDER_SITE_OTHER): Payer: Medicare Other | Admitting: Family Medicine

## 2011-07-31 VITALS — BP 142/90 | HR 80 | Temp 98.3°F | Wt 252.0 lb

## 2011-07-31 DIAGNOSIS — B029 Zoster without complications: Secondary | ICD-10-CM | POA: Insufficient documentation

## 2011-07-31 MED ORDER — GABAPENTIN 300 MG PO CAPS: ORAL_CAPSULE | ORAL | Status: DC

## 2011-07-31 NOTE — Progress Notes (Signed)
Subjective:    [name], male    DOB: Jun 11, 1945, 66 y.o.   MRN: [id number]  HPI  66 yo pt of Dr. Patsy Lager here for shingles pain.  Had very painful vesicular lesions on outer right ear and in canal that erupted this week. Had what sounds like some dry skin and herpetic prodrome tragus pain prior with appearance of OE (very tender to palpation). Treated  for OE with ciprodex and IM decadron by Dr. Patsy Lager this week.  Saw Dr. Kelli Churn (ENT) who gave him Valtrex because rash had appeared by that time.  Pain is improving but remains quite severe- comes in very sharp, erratic episodes.   No hearing loss. No fevers. No facial numbness. No focal neurological issues.  H/o zostavax in 2008.  Patient Active Problem List  Diagnoses  . LIPOMA, BACK  . HYPERLIPIDEMIA  . CARPAL TUNNEL SYNDROME, BILATERAL  . HYPERTENSION  . ALLERGIC RHINITIS  . COPD  . GERD  . VENTRAL HERNIA  . OSTEOARTHRITIS, KNEE, RIGHT  . BACK PAIN  . SKIN CANCER, HX OF  . COLONIC POLYPS, HX OF  . Ear pain   Past Medical History  Diagnosis Date  . Osteoarthrosis, unspecified whether generalized or localized, lower leg   . Chronic airway obstruction, not elsewhere classified   . Allergic rhinitis, cause unspecified   . Lipoma of unspecified site   . Carpal tunnel syndrome   . Unspecified essential hypertension   . Other and unspecified hyperlipidemia   . Esophageal reflux   . Personal history of colonic polyps   . Personal history of other malignant neoplasm of skin    Past Surgical History  Procedure Date  . Total knee arthroplasty     03-2009 [facility]  . Bone spur 06-2003    right thumb  . Shoulder surgery 03-24-2007    removal bone spur  . Knee arthroscopy 09-2007    partial medial mesicecotmy, patellar chonfroplasty   History  Substance Use Topics  . Smoking status: Former Smoker -- 2.0 packs/day for 30 years  . Smokeless tobacco: Never Used  . Alcohol Use: Yes   Family History   Problem Relation Age of Onset  . Diabetes Mother   . Hyperlipidemia Mother   . Hypertension Mother   . Heart attack Mother   . Obesity Mother   . Hyperlipidemia Father   . Hypertension Father   . Cancer Father     lung  . Hypertension Sister   . Hyperlipidemia Sister   . Hyperlipidemia Brother   . Hypertension Brother   . Hyperlipidemia Brother   . Hypertension Brother   . Cancer Sister     lung  . Hyperlipidemia Sister    No Known Allergies Current Outpatient Prescriptions on File Prior to Visit  Medication Sig Dispense Refill  . amLODipine (NORVASC) 5 MG tablet Take 1 tablet (5 mg total) by mouth daily.  30 tablet  7  . aspirin 81 MG tablet Take 81 mg by mouth daily.        Marland Kitchen atorvastatin (LIPITOR) 40 MG tablet Take 1 tablet (40 mg total) by mouth daily.  30 tablet  11  . diclofenac (VOLTAREN) 75 MG EC tablet Take 1 tablet (75 mg total) by mouth 2 (two) times daily.  60 tablet  3  . fish oil-omega-3 fatty acids 1000 MG capsule Take 2 g by mouth daily.        . Garlic Oil 500 MG TABS Take 1 tablet by  mouth daily.        Marland Kitchen lisinopril (PRINIVIL,ZESTRIL) 40 MG tablet Take 1 tablet (40 mg total) by mouth daily.  30 tablet  6  . Multiple Vitamin (MULTIVITAMIN) tablet Take 1 tablet by mouth daily.        Marland Kitchen omeprazole (PRILOSEC) 20 MG capsule Take 20 mg by mouth daily.        . sertraline (ZOLOFT) 50 MG tablet Take 1 tablet (50 mg total) by mouth daily.  30 tablet  7  . gabapentin (NEURONTIN) 300 MG capsule 1 tablet three times daily as needed for pain- you may start out with 1 tablet twice daily for first 1 or 2 days while it gets in your system  90 capsule  0   The PMH, PSH, Social History, Family History, Medications, and allergies have been reviewed in [facility], and have been updated if relevant.   Review of Systems    As per HPI Objective:   Physical Exam BP 142/90  Pulse 80  Temp(Src) 98.3 F (36.8 C) (Oral)  Wt 252 lb (114.306 kg) Gen:  Alert, pleasant, NAD HEENT:   Tragus now NTTP and no swelling (improved from Dr. Cyndie Chime exam), small vesicular, crusting lesions in canal, TM looks clear Neuro:  Normal gait     Assessment & Plan:   1. Shingles    Discussed course of shingles pain and possibility of post herpetic neuralgia months from now. Finished course of Valtrex. Will start Gabapentin for pain- see pt instructions for details. Follow up with Dr. Patsy Lager as needed.

## 2011-07-31 NOTE — Patient Instructions (Signed)
Nice to meet you. We are starting gabapentin- 1 tablet three times daily ( ok to start with 1 or two tablets daily while it gets in your system).

## 2011-10-25 ENCOUNTER — Encounter: Payer: Self-pay | Admitting: Family Medicine

## 2011-10-25 ENCOUNTER — Ambulatory Visit (INDEPENDENT_AMBULATORY_CARE_PROVIDER_SITE_OTHER): Payer: Medicare Other | Admitting: Family Medicine

## 2011-10-25 VITALS — BP 140/90 | HR 79 | Temp 97.9°F | Ht 72.0 in | Wt 247.8 lb

## 2011-10-25 DIAGNOSIS — M7740 Metatarsalgia, unspecified foot: Secondary | ICD-10-CM

## 2011-10-25 DIAGNOSIS — M7750 Other enthesopathy of unspecified foot: Secondary | ICD-10-CM

## 2011-10-25 NOTE — Progress Notes (Signed)
Nature conservation officer at [facility] [address] [place] [address] Phone: [phone] Fax: [phone]  Date:  10/25/2011   Name:  Alan [name]   DOB:  10/26/1945   MRN:  [id number]  PCP:  Hannah Beat, MD    Chief Complaint: Foot Pain   History of Present Illness:  Alan [name] is a 66 y.o. very pleasant male patient who presents with the following:  2 1/2 years ago, slipped coming down the attack and hurt his 4th and 3rd toes. Also hurts in the IP joint in the 1st.   R side  4 foot pain on the RIGHT side. No ankle pain. He also has some pain in the appendectomy joint on the RIGHT. No trauma. Good movement at the great toe.  No bruising. No acute injury. At this point, he has not tried much.  He historically has worked on his feet quite a bit for a long time.  Past Medical History, Surgical History, Social History, Family History, Problem List, Medications, and Allergies have been reviewed and updated if relevant.  Current Outpatient Prescriptions on File Prior to Visit  Medication Sig Dispense Refill  . amLODipine (NORVASC) 5 MG tablet Take 1 tablet (5 mg total) by mouth daily.  30 tablet  7  . aspirin 81 MG tablet Take 81 mg by mouth daily.        Marland Kitchen atorvastatin (LIPITOR) 40 MG tablet Take 1 tablet (40 mg total) by mouth daily.  30 tablet  11  . diclofenac (VOLTAREN) 75 MG EC tablet Take 1 tablet (75 mg total) by mouth 2 (two) times daily.  60 tablet  3  . fish oil-omega-3 fatty acids 1000 MG capsule Take 2 g by mouth daily.        Marland Kitchen gabapentin (NEURONTIN) 300 MG capsule 1 tablet three times daily as needed for pain- you may start out with 1 tablet twice daily for first 1 or 2 days while it gets in your system  90 capsule  0  . Garlic Oil 500 MG TABS Take 1 tablet by mouth daily.        Marland Kitchen lisinopril (PRINIVIL,ZESTRIL) 40 MG tablet Take 1 tablet (40 mg total) by mouth daily.  30 tablet  6  . Multiple Vitamin (MULTIVITAMIN) tablet Take 1 tablet by mouth  daily.        Marland Kitchen omeprazole (PRILOSEC) 20 MG capsule Take 20 mg by mouth daily.        . sertraline (ZOLOFT) 50 MG tablet Take 1 tablet (50 mg total) by mouth daily.  30 tablet  7    Review of Systems:  GEN: No fevers, chills. Nontoxic. Primarily MSK c/o today. MSK: Detailed in the HPI GI: tolerating PO intake without difficulty Neuro: No numbness, parasthesias, or tingling associated. Otherwise the pertinent positives of the ROS are noted above.    Physical Examination: Filed Vitals:   10/25/11 0938  BP: 140/90  Pulse: 79  Temp: 97.9 F (36.6 C)   Filed Vitals:   10/25/11 0938  Height: 6' (1.829 m)  Weight: 247 lb 12 oz (112.379 kg)   Body mass index is 33.60 kg/(m^2). Ideal Body Weight: Weight in (lb) to have BMI = 25: 183.9    GEN: WDWN, NAD, Non-toxic, Alert & Oriented x 3 HEENT: Atraumatic, Normocephalic.  Ears and Nose: No external deformity. EXTR: No clubbing/cyanosis/edema NEURO: Normal gait.  PSYCH: Normally interactive. Conversant. Not depressed or anxious appearing.  Calm demeanor.   FEET:  R Echymosis: no Edema: no ROM: full LE B Gait: heel toe, non-antalgic MT pain: forefoot mostly around 3-4 in the soft tissue Callus pattern: none Lateral Mall: NT Medial Mall: NT Talus: NT Navicular: NT Cuboid: NT Calcaneous: NT Metatarsals: NT 5th MT: NT Phalanges: NT Achilles: NT Plantar Fascia: NT Fat Pad: NT Peroneals: NT Post Tib: NT Great Toe: Nml motion Ant Drawer: neg ATFL: NT CFL: NT Deltoid: NT Other foot breakdown: none Long arch: preserved Transverse arch: moderate breakdown, dropped MT heads, 2-4 Hindfoot breakdown: none Sensation: intact   Assessment and Plan:  1. Metatarsalgia    Placed in some sports insoles and placed a poron MT bar at the forefoot  F/u in 6-8 weeks if not better, could do a neuroma inj.  Orders Today:  No orders of the defined types were placed in this encounter.    Medications Today: (Includes new updates  added during medication reconciliation) Meds ordered this encounter  Medications  . Travoprost, BAK Free, (TRAVATAN) 0.004 % SOLN ophthalmic solution    Sig: Place 1 drop into both eyes at bedtime.     Hannah Beat, MD

## 2011-11-07 ENCOUNTER — Other Ambulatory Visit: Payer: Self-pay | Admitting: *Deleted

## 2011-11-07 MED ORDER — AMLODIPINE BESYLATE 5 MG PO TABS: 5.0000 mg | ORAL_TABLET | Freq: Every day | ORAL | Status: DC

## 2011-11-07 MED ORDER — LISINOPRIL 40 MG PO TABS: 40.0000 mg | ORAL_TABLET | Freq: Every day | ORAL | Status: DC

## 2011-11-07 MED ORDER — SERTRALINE HCL 50 MG PO TABS: 50.0000 mg | ORAL_TABLET | Freq: Every day | ORAL | Status: DC

## 2011-11-08 ENCOUNTER — Other Ambulatory Visit: Payer: Self-pay | Admitting: *Deleted

## 2011-11-08 DIAGNOSIS — E785 Hyperlipidemia, unspecified: Secondary | ICD-10-CM

## 2011-11-08 MED ORDER — ATORVASTATIN CALCIUM 40 MG PO TABS: 40.0000 mg | ORAL_TABLET | Freq: Every day | ORAL | Status: DC

## 2011-11-20 ENCOUNTER — Other Ambulatory Visit: Payer: Self-pay | Admitting: Family Medicine

## 2011-11-20 DIAGNOSIS — Z125 Encounter for screening for malignant neoplasm of prostate: Secondary | ICD-10-CM

## 2011-11-20 DIAGNOSIS — E785 Hyperlipidemia, unspecified: Secondary | ICD-10-CM

## 2011-11-20 DIAGNOSIS — Z79899 Other long term (current) drug therapy: Secondary | ICD-10-CM

## 2011-11-26 ENCOUNTER — Encounter: Payer: Self-pay | Admitting: Family Medicine

## 2011-11-26 ENCOUNTER — Ambulatory Visit (INDEPENDENT_AMBULATORY_CARE_PROVIDER_SITE_OTHER): Payer: Medicare Other | Admitting: Family Medicine

## 2011-11-26 VITALS — BP 120/68 | HR 75 | Temp 98.3°F | Ht 72.0 in | Wt 249.0 lb

## 2011-11-26 DIAGNOSIS — L989 Disorder of the skin and subcutaneous tissue, unspecified: Secondary | ICD-10-CM

## 2011-11-26 DIAGNOSIS — L820 Inflamed seborrheic keratosis: Secondary | ICD-10-CM

## 2011-11-26 DIAGNOSIS — L570 Actinic keratosis: Secondary | ICD-10-CM

## 2011-11-26 NOTE — Progress Notes (Signed)
Procedure Visit:  Cryotherapy  Reason: concern for precancerous lesions, Actinic Keratosis (#2 total) Location: L leg and R shoulder  Liquid nitrogen was applied using the liquid nitrogen gun without difficulty with an otoscope tip for concentration. Tolerated well without complications.     Shave biopsy  Meds, vitals, and allergies reviewed.   Indication: suspicious lesion, suspect basal cell, or inflammed seb keratosis  Location: Back  Size: 1.1 cm  Verbal informed consent obtained.  Pt aware of risks not limited to but including infection,  bleeding, damage to near by organs.  Prep: etoh  Anesthesia: 1%lidocaine with epi, good effect Shave made with dermablade Minimal oozing, controlled with silver nitrate Tolerated well Routine postprocedure instructions d/w pt- keep area clean and bandaged, follow up if concerns/spreading erythema/pain.  Sent to pathology

## 2011-11-28 ENCOUNTER — Other Ambulatory Visit: Payer: BC Managed Care – PPO

## 2011-12-04 ENCOUNTER — Encounter: Payer: BC Managed Care – PPO | Admitting: Family Medicine

## 2011-12-05 ENCOUNTER — Ambulatory Visit (INDEPENDENT_AMBULATORY_CARE_PROVIDER_SITE_OTHER): Payer: Medicare Other | Admitting: Family Medicine

## 2011-12-05 ENCOUNTER — Encounter: Payer: Self-pay | Admitting: Family Medicine

## 2011-12-05 VITALS — BP 144/80 | HR 80 | Temp 98.2°F | Resp 16 | Ht 70.0 in | Wt 247.5 lb

## 2011-12-05 DIAGNOSIS — Z125 Encounter for screening for malignant neoplasm of prostate: Secondary | ICD-10-CM

## 2011-12-05 DIAGNOSIS — Z23 Encounter for immunization: Secondary | ICD-10-CM

## 2011-12-05 DIAGNOSIS — E785 Hyperlipidemia, unspecified: Secondary | ICD-10-CM

## 2011-12-05 DIAGNOSIS — Z79899 Other long term (current) drug therapy: Secondary | ICD-10-CM

## 2011-12-05 DIAGNOSIS — Z0000 Encounter for general adult medical examination without abnormal findings: Secondary | ICD-10-CM

## 2011-12-05 LAB — LIPID PANEL
Cholesterol: 222 mg/dL — ABNORMAL HIGH (ref 0–200)
Total CHOL/HDL Ratio: 3
VLDL: 39.2 mg/dL (ref 0.0–40.0)

## 2011-12-05 LAB — HEPATIC FUNCTION PANEL
AST: 30 U/L (ref 0–37)
Alkaline Phosphatase: 53 U/L (ref 39–117)
Total Bilirubin: 0.6 mg/dL (ref 0.3–1.2)

## 2011-12-05 LAB — CBC WITH DIFFERENTIAL/PLATELET
Basophils Absolute: 0 10*3/uL (ref 0.0–0.1)
Lymphocytes Relative: 30.1 % (ref 12.0–46.0)
Monocytes Relative: 8.4 % (ref 3.0–12.0)
Platelets: 150 10*3/uL (ref 150.0–400.0)
RDW: 12.9 % (ref 11.5–14.6)

## 2011-12-05 LAB — BASIC METABOLIC PANEL
BUN: 27 mg/dL — ABNORMAL HIGH (ref 6–23)
Calcium: 10 mg/dL (ref 8.4–10.5)
GFR: 100.09 mL/min (ref >60.00)
Glucose, Bld: 107 mg/dL — ABNORMAL HIGH (ref 70–99)

## 2011-12-05 LAB — LDL CHOLESTEROL, DIRECT: Direct LDL: 121.2 mg/dL

## 2011-12-05 MED ORDER — SILDENAFIL CITRATE 100 MG PO TABS: 100.0000 mg | ORAL_TABLET | Freq: Every day | ORAL | Status: DC | PRN

## 2011-12-05 NOTE — Progress Notes (Signed)
Nature conservation officer at [facility] [address] [place] [address] Phone: [phone] Fax: [phone]  Date:  12/05/2011   Name:  Alan [name]   DOB:  [birth year]   MRN:  [id number] Gender: male Age: 66 y.o.  PCP:  Hannah Beat, MD    Chief Complaint: Annual Exam   History of Present Illness:  Alan [name] is a 66 y.o. very pleasant male patient who presents with the following:  Preventative Health Maintenance Visit:  Health Maintenance Summary Reviewed and updated, unless pt declines services.  Tobacco History Reviewed. (75 pack years -- distantly quit) Alcohol: No concerns, no excessive use Exercise Habits: Some activity, rec at least 30 mins 5 times a week - some walking and a lot of yardwork STD concerns: no risk or activity to increase risk Drug Use: None Encouraged self-testicular check  Health Maintenance  Topic Date Due  . Influenza Vaccine  01/01/2012  . Tetanus/tdap  06/12/2016  . Colonoscopy  10/05/2018  . Pneumococcal Polysaccharide Vaccine Age 61 And Over  Completed  . Zostavax  Completed    Past Medical History, Surgical History, Social History, Family History, Problem List, Medications, and Allergies have been reviewed and updated if relevant.  Current Outpatient Prescriptions on File Prior to Visit  Medication Sig Dispense Refill  . amLODipine (NORVASC) 5 MG tablet Take 1 tablet (5 mg total) by mouth daily.  30 tablet  7  . aspirin 81 MG tablet Take 81 mg by mouth daily.        Marland Kitchen atorvastatin (LIPITOR) 40 MG tablet Take 1 tablet (40 mg total) by mouth daily.  30 tablet  7  . diclofenac (VOLTAREN) 75 MG EC tablet Take 1 tablet (75 mg total) by mouth 2 (two) times daily.  60 tablet  3  . fish oil-omega-3 fatty acids 1000 MG capsule Take 2 g by mouth daily.        Marland Kitchen lisinopril (PRINIVIL,ZESTRIL) 40 MG tablet Take 1 tablet (40 mg total) by mouth daily.  30 tablet  6  . Multiple Vitamin (MULTIVITAMIN) tablet Take 1 tablet by mouth daily.         Marland Kitchen omeprazole (PRILOSEC) 20 MG capsule Take 20 mg by mouth daily.        . sertraline (ZOLOFT) 50 MG tablet Take 1 tablet (50 mg total) by mouth daily.  30 tablet  7  . Travoprost, BAK Free, (TRAVATAN) 0.004 % SOLN ophthalmic solution Place 1 drop into both eyes at bedtime.        Review of Systems:  General: Denies fever, chills, sweats. No significant weight loss. Eyes: Denies blurring,significant itching ENT: Denies earache, sore throat, and hoarseness. Cardiovascular: Denies chest pains, palpitations, dyspnea on exertion Respiratory: Denies cough, dyspnea at rest,wheeezing Breast: no concerns about lumps GI: Denies nausea, vomiting, diarrhea, constipation, change in bowel habits, abdominal pain, melena, hematochezia GU: Denies penile discharge, ED, urinary flow / outflow problems. No STD concerns. Musculoskeletal: Denies back pain, joint pain Derm: Denies rash, itching Neuro: Denies  paresthesias, frequent falls, frequent headaches Psych: Denies depression, anxiety Endocrine: Denies cold intolerance, heat intolerance, polydipsia Heme: Denies enlarged lymph nodes Allergy: No hayfever  Physical Examination: Filed Vitals:   12/05/11 0833  BP: 144/80  Pulse: 80  Temp: 98.2 F (36.8 C)  Resp: 16   Filed Vitals:   12/05/11 0833  Height: 5\' 10"  (1.778 m)  Weight: 247 lb 8 oz (112.265 kg)   Body mass index is 35.51 kg/(m^2). Ideal Body  Weight: Weight in (lb) to have BMI = 25: 173.9    Wt Readings from Last 3 Encounters:  12/05/11 247 lb 8 oz (112.265 kg)  11/26/11 249 lb (112.946 kg)  10/25/11 247 lb 12 oz (112.379 kg)    GEN: well developed, well nourished, no acute distress Eyes: conjunctiva and lids normal, PERRLA, EOMI ENT: TM clear, nares clear, oral exam WNL Neck: supple, no lymphadenopathy, no thyromegaly, no JVD Pulm: clear to auscultation and percussion, respiratory effort normal CV: regular rate and rhythm, S1-S2, no murmur, rub or gallop, no bruits,  peripheral pulses normal and symmetric, no cyanosis, clubbing, edema or varicosities Chest: no scars, masses GI: soft, non-tender; no hepatosplenomegaly, masses; active bowel sounds all quadrants GU: no hernia, testicular mass, penile discharge, or prostate enlargement Lymph: no cervical, axillary or inguinal adenopathy MSK: gait normal, muscle tone and strength WNL, no joint swelling, effusions, discoloration, crepitus  SKIN: clear, good turgor, color WNL, no rashes, lesions, or ulcerations Neuro: normal mental status, normal strength, sensation, and motion Psych: alert; oriented to person, place and time, normally interactive and not anxious or depressed in appearance.   Assessment and Plan:  1. Routine general medical examination at a health care facility    2. Encounter for long-term (current) use of other medications  Basic metabolic panel, CBC with Differential, Hepatic function panel  3. Other and unspecified hyperlipidemia  Lipid panel  4. Special screening for malignant neoplasm of prostate  PSA  5. Need for pneumococcal vaccination     The patient's preventative maintenance and recommended screening tests for an annual wellness exam were reviewed in full today. Brought up to date unless services declined.  Counselled on the importance of diet, exercise, and its role in overall health and mortality. The patient's FH and SH was reviewed, including their home life, tobacco status, and drug and alcohol status.   Doing well overall Check labs  Pneumovax Work on [employer]  Orders Today:  Orders Placed This Encounter  Procedures  . Pneumococcal polysaccharide vaccine 23-valent greater than or equal to 2yo subcutaneous/IM  . Basic metabolic panel  . CBC with Differential  . Hepatic function panel  . Lipid panel  . PSA    Medications Today: (Includes new updates added during medication reconciliation) Meds ordered this encounter  Medications  . sildenafil (VIAGRA) 100 MG  tablet    Sig: Take 1 tablet (100 mg total) by mouth daily as needed for erectile dysfunction.    Dispense:  10 tablet    Refill:  11    Medications Discontinued: Medications Discontinued During This Encounter  Medication Reason  . Garlic Oil 500 MG TABS Error     Hannah Beat, MD

## 2012-03-19 ENCOUNTER — Other Ambulatory Visit: Payer: Self-pay

## 2012-03-19 ENCOUNTER — Encounter (HOSPITAL_COMMUNITY): Payer: Self-pay

## 2012-03-19 ENCOUNTER — Emergency Department (HOSPITAL_COMMUNITY): Payer: Medicare Other

## 2012-03-19 ENCOUNTER — Emergency Department (HOSPITAL_COMMUNITY)
Admission: EM | Admit: 2012-03-19 | Discharge: 2012-03-20 | Disposition: A | Discharge status: Home / Self Care | Payer: Medicare Other | Attending: Emergency Medicine | Admitting: Emergency Medicine

## 2012-03-19 DIAGNOSIS — J449 Chronic obstructive pulmonary disease, unspecified: Secondary | ICD-10-CM | POA: Insufficient documentation

## 2012-03-19 DIAGNOSIS — Z79899 Other long term (current) drug therapy: Secondary | ICD-10-CM | POA: Insufficient documentation

## 2012-03-19 DIAGNOSIS — Z860100 Personal history of colon polyps, unspecified: Secondary | ICD-10-CM | POA: Insufficient documentation

## 2012-03-19 DIAGNOSIS — W1809XA Striking against other object with subsequent fall, initial encounter: Secondary | ICD-10-CM | POA: Insufficient documentation

## 2012-03-19 DIAGNOSIS — S8000XA Contusion of unspecified knee, initial encounter: Secondary | ICD-10-CM

## 2012-03-19 DIAGNOSIS — Y939 Activity, unspecified: Secondary | ICD-10-CM | POA: Insufficient documentation

## 2012-03-19 DIAGNOSIS — W19XXXA Unspecified fall, initial encounter: Secondary | ICD-10-CM

## 2012-03-19 DIAGNOSIS — K219 Gastro-esophageal reflux disease without esophagitis: Secondary | ICD-10-CM | POA: Insufficient documentation

## 2012-03-19 DIAGNOSIS — IMO0002 Reserved for concepts with insufficient information to code with codable children: Secondary | ICD-10-CM | POA: Insufficient documentation

## 2012-03-19 DIAGNOSIS — E785 Hyperlipidemia, unspecified: Secondary | ICD-10-CM | POA: Insufficient documentation

## 2012-03-19 DIAGNOSIS — Z87891 Personal history of nicotine dependence: Secondary | ICD-10-CM | POA: Insufficient documentation

## 2012-03-19 DIAGNOSIS — Z85820 Personal history of malignant melanoma of skin: Secondary | ICD-10-CM | POA: Insufficient documentation

## 2012-03-19 DIAGNOSIS — Y929 Unspecified place or not applicable: Secondary | ICD-10-CM | POA: Insufficient documentation

## 2012-03-19 DIAGNOSIS — I10 Essential (primary) hypertension: Secondary | ICD-10-CM | POA: Insufficient documentation

## 2012-03-19 DIAGNOSIS — M1710 Unilateral primary osteoarthritis, unspecified knee: Secondary | ICD-10-CM | POA: Insufficient documentation

## 2012-03-19 DIAGNOSIS — Z7982 Long term (current) use of aspirin: Secondary | ICD-10-CM | POA: Insufficient documentation

## 2012-03-19 DIAGNOSIS — J4489 Other specified chronic obstructive pulmonary disease: Secondary | ICD-10-CM | POA: Insufficient documentation

## 2012-03-19 DIAGNOSIS — G5600 Carpal tunnel syndrome, unspecified upper limb: Secondary | ICD-10-CM | POA: Insufficient documentation

## 2012-03-19 LAB — CBC WITH DIFFERENTIAL/PLATELET
Basophils Absolute: 0 10*3/uL (ref 0.0–0.1)
Basophils Relative: 0 % (ref 0–1)
Eosinophils Relative: 3 % (ref 0–5)
Lymphocytes Relative: 24 % (ref 12–46)
MCHC: 33.9 g/dL (ref 30.0–36.0)
MCV: 90 fL (ref 78.0–100.0)
Platelets: 162 10*3/uL (ref 150–400)
RDW: 12.8 % (ref 11.5–15.5)
WBC: 6.5 10*3/uL (ref 4.0–10.5)

## 2012-03-19 LAB — POCT I-STAT, CHEM 8
BUN: 21 mg/dL (ref 6–23)
Calcium, Ion: 1.17 mmol/L (ref 1.13–1.30)
Chloride: 106 mEq/L (ref 96–112)
Creatinine, Ser: 0.8 mg/dL (ref 0.50–1.35)
Sodium: 140 mEq/L (ref 135–145)

## 2012-03-19 LAB — POCT I-STAT TROPONIN I: Troponin i, poc: 0 ng/mL (ref 0.00–0.08)

## 2012-03-19 NOTE — ED Notes (Signed)
Per ems- pt felt dizzy at 1900 tonight, fell forward onto right knee, c/o right knee pain. Pt then had episode of chest pain for 10 min after fall. When ems arrived pt was pain free. En route pt began c/o chest pain for 20 seconds, then c/o abdominal pain and neck pain. Pt then stated he felt "tingly." Currently pt c/o right knee pain, has hx of knee replacement, and nausea. Denies chest pain. 20g IV left hand. Pos ETOH. BP-140/80 HR-84, NSR, unremarkable 12 lead.

## 2012-03-19 NOTE — ED Notes (Signed)
No deformity, discoloration noted to right knee. Mild swelling noted to right lateral aspect of knee. No complaints at this time, pt states "well I'm all right right now."

## 2012-03-19 NOTE — ED Provider Notes (Signed)
History     CSN: [id number]  Arrival date & time 03/19/12  2001   First MD Initiated Contact with Patient 03/19/12 2312      Chief Complaint  Patient presents with  . Multiple Complaints     (Consider location/radiation/quality/duration/timing/severity/associated sxs/prior treatment) Patient is a 66 y.o. male presenting with fall and knee pain. The history is provided by the patient. No language interpreter was used.  Fall The accident occurred 3 to 5 hours ago. Incident: quickly standing from a seated position. He fell from a height of 1 to 2 ft. He landed on a hard floor. There was no blood loss. Point of impact: right knee. The pain is severe. He was ambulatory at the scene. There was no entrapment after the fall. There was no drug use involved in the accident. There was alcohol use involved in the accident. Pertinent negatives include no visual change, no fever, no numbness, no abdominal pain, no bowel incontinence, no vomiting, no hematuria, no headaches, no hearing loss and no loss of consciousness. The symptoms are aggravated by activity. He has tried nothing for the symptoms. The treatment provided no relief.  Knee Pain This is a new problem. The current episode started 6 to 12 hours ago. The problem occurs constantly. The problem has not changed since onset.Pertinent negatives include no abdominal pain, no headaches and no shortness of breath. Nothing aggravates the symptoms. Nothing relieves the symptoms. He has tried nothing for the symptoms. The treatment provided no relief.  Had an episode of seconds of chest pain that was dull post fall.  States he had one vodka orange juice at 2 pm  Past Medical History  Diagnosis Date  . Osteoarthrosis, unspecified whether generalized or localized, lower leg   . Chronic airway obstruction, not elsewhere classified   . Allergic rhinitis, cause unspecified   . Lipoma of unspecified site   . Carpal tunnel syndrome   . Unspecified essential  hypertension   . Other and unspecified hyperlipidemia   . Esophageal reflux   . Personal history of colonic polyps   . Personal history of other malignant neoplasm of skin     Past Surgical History  Procedure Date  . Total knee arthroplasty     03-2009 [facility]  . Bone spur 06-2003    right thumb  . Shoulder surgery 03-24-2007    removal bone spur  . Knee arthroscopy 09-2007    partial medial mesicecotmy, patellar chonfroplasty    Family History  Problem Relation Age of Onset  . Diabetes Mother   . Hyperlipidemia Mother   . Hypertension Mother   . Heart attack Mother   . Obesity Mother   . Hyperlipidemia Father   . Hypertension Father   . Cancer Father     lung  . Hypertension Sister   . Hyperlipidemia Sister   . Hyperlipidemia Brother   . Hypertension Brother   . Hyperlipidemia Brother   . Hypertension Brother   . Cancer Sister     lung  . Hyperlipidemia Sister     History  Substance Use Topics  . Smoking status: Former Smoker -- 3.0 packs/day for 75 years  . Smokeless tobacco: Never Used  . Alcohol Use: Yes     Comment: 4 mixed drinks per week      Review of Systems  Constitutional: Negative for fever.  HENT: Negative for neck stiffness.   Eyes: Negative for photophobia.  Respiratory: Negative for cough, chest tightness, shortness of breath and wheezing.  Cardiovascular: Negative for palpitations and leg swelling.  Gastrointestinal: Negative for vomiting, abdominal pain, diarrhea, constipation and bowel incontinence.  Genitourinary: Negative for hematuria.  Neurological: Negative for dizziness, seizures, loss of consciousness, syncope, facial asymmetry, speech difficulty, weakness, numbness and headaches.  All other systems reviewed and are negative.    Allergies  Review of patient's allergies indicates no known allergies.  Home Medications   Current Outpatient Rx  Name  Route  Sig  Dispense  Refill  . AMLODIPINE BESYLATE 5 MG PO TABS   Oral    Take 1 tablet (5 mg total) by mouth daily.   30 tablet   7   . ASPIRIN 81 MG PO TABS   Oral   Take 81 mg by mouth daily.           . ATORVASTATIN CALCIUM 40 MG PO TABS   Oral   Take 1 tablet (40 mg total) by mouth daily.   30 tablet   7     BRAND MEDICALLY NECESSARY: Brand Lipitor   . DICLOFENAC SODIUM 75 MG PO TBEC   Oral   Take 75 mg by mouth 2 (two) times daily as needed. For pain         . OMEGA-3 FATTY ACIDS 1000 MG PO CAPS   Oral   Take 2 g by mouth daily.           Marland Kitchen LISINOPRIL 40 MG PO TABS   Oral   Take 1 tablet (40 mg total) by mouth daily.   30 tablet   6   . ONE-DAILY MULTI VITAMINS PO TABS   Oral   Take 1 tablet by mouth daily.           Marland Kitchen OMEPRAZOLE 20 MG PO CPDR   Oral   Take 20 mg by mouth daily.           . SERTRALINE HCL 50 MG PO TABS   Oral   Take 1 tablet (50 mg total) by mouth daily.   30 tablet   7   . TRAVOPROST (BAK FREE) 0.004 % OP SOLN   Both Eyes   Place 1 drop into both eyes at bedtime.         Marland Kitchen SILDENAFIL CITRATE 100 MG PO TABS   Oral   Take 1 tablet (100 mg total) by mouth daily as needed for erectile dysfunction.   10 tablet   11     BP 115/62  Pulse 86  Temp 98.3 F (36.8 C) (Oral)  Resp 16  SpO2 98%  Physical Exam  Constitutional: He is oriented to person, place, and time. He appears well-developed and well-nourished.  HENT:  Head: Normocephalic and atraumatic.  Right Ear: External ear normal. No mastoid tenderness. No hemotympanum.  Left Ear: External ear normal. No mastoid tenderness. No hemotympanum.  Mouth/Throat: Oropharynx is clear and moist.  Eyes: Conjunctivae normal and EOM are normal. Pupils are equal, round, and reactive to light.  Neck: Normal range of motion. Neck supple.  Cardiovascular: Normal rate and regular rhythm.   Pulmonary/Chest: Effort normal and breath sounds normal. He has no wheezes. He has no rales. He exhibits no tenderness.  Abdominal: Soft. Bowel sounds are normal.  There is no tenderness. There is no rebound and no guarding.  Musculoskeletal: Normal range of motion. He exhibits no edema and no tenderness.       Negative anterior and posterior drawer tests of the right knee.  No laxity to varus or valgus  stress  Neurological: He is alert and oriented to person, place, and time. He has normal reflexes. He displays normal reflexes. No cranial nerve deficit. He exhibits normal muscle tone. Coordination normal.  Skin: Skin is warm and dry.  Psychiatric: He has a normal mood and affect.    ED Course  Procedures (including critical care time)  Labs Reviewed  CBC WITH DIFFERENTIAL - Abnormal; Notable for the following:    RBC 4.20 (*)     Hemoglobin 12.8 (*)     HCT 37.8 (*)     All other components within normal limits  POCT I-STAT, CHEM 8 - Abnormal; Notable for the following:    Glucose, Bld 102 (*)     Hemoglobin 12.6 (*)     HCT 37.0 (*)     All other components within normal limits  POCT I-STAT TROPONIN I  URINALYSIS, ROUTINE W REFLEX MICROSCOPIC  ETHANOL   No results found.   No diagnosis found.    MDM  Patient with h/o of GERD.  Suspect symptoms related to same.  Also suspect all issues secondary to ETOH and that there was more than one alcoholic beverage with ETOH 92 11 hours post ingestion.  Return for chest pain shortness of breath weakness numbness changes in vision or speech.         Jasmine Awe, MD 03/20/12 [phone]

## 2012-03-20 ENCOUNTER — Emergency Department (HOSPITAL_COMMUNITY): Payer: Medicare Other

## 2012-03-20 LAB — URINALYSIS, ROUTINE W REFLEX MICROSCOPIC
Ketones, ur: NEGATIVE mg/dL
Leukocytes, UA: NEGATIVE
Nitrite: NEGATIVE
Protein, ur: NEGATIVE mg/dL
Urobilinogen, UA: 0.2 mg/dL (ref 0.0–1.0)
pH: 5.5 (ref 5.0–8.0)

## 2012-03-20 LAB — POCT I-STAT TROPONIN I: Troponin i, poc: 0.01 ng/mL (ref 0.00–0.08)

## 2012-03-20 MED ORDER — OXYCODONE-ACETAMINOPHEN 5-325 MG PO TABS
1.0000 | ORAL_TABLET | Freq: Once | ORAL | Status: AC
  Administered 2012-03-20: 1 via ORAL
  Filled 2012-03-20: qty 1

## 2012-03-20 MED ORDER — HYDROCODONE-IBUPROFEN 7.5-200 MG PO TABS: 1.0000 | ORAL_TABLET | Freq: Four times a day (QID) | ORAL | Status: DC | PRN

## 2012-03-20 NOTE — ED Notes (Signed)
Pt back from CT.  Ice pack placed on R knee.  Pt states minimal pain to R knee.  Denies any other pain.

## 2012-03-27 ENCOUNTER — Ambulatory Visit (INDEPENDENT_AMBULATORY_CARE_PROVIDER_SITE_OTHER): Payer: Medicare Other | Admitting: Family Medicine

## 2012-03-27 ENCOUNTER — Encounter: Payer: Self-pay | Admitting: Family Medicine

## 2012-03-27 VITALS — BP 120/78 | HR 102 | Temp 98.4°F | Ht 70.0 in | Wt 253.5 lb

## 2012-03-27 DIAGNOSIS — J029 Acute pharyngitis, unspecified: Secondary | ICD-10-CM

## 2012-03-27 DIAGNOSIS — J069 Acute upper respiratory infection, unspecified: Secondary | ICD-10-CM

## 2012-03-27 DIAGNOSIS — J020 Streptococcal pharyngitis: Secondary | ICD-10-CM

## 2012-03-27 LAB — POCT RAPID STREP A (OFFICE): Rapid Strep A Screen: POSITIVE — AB

## 2012-03-27 MED ORDER — AMOXICILLIN 875 MG PO TABS: 875.0000 mg | ORAL_TABLET | Freq: Two times a day (BID) | ORAL | Status: DC

## 2012-03-27 NOTE — Progress Notes (Signed)
Patient presents with sore throat for several days. Subjective fevers, achiness, headache. Some nausea. + nasal congestion and coughing, but ST is the worst No n/v/d No rashes O/w doing well  The PMH, PSH, Social History, Family History, Medications, and allergies have been reviewed in [facility], and have been updated if relevant.   Patient Active Problem List  Diagnosis  . LIPOMA, BACK  . HYPERLIPIDEMIA  . CARPAL TUNNEL SYNDROME, BILATERAL  . HYPERTENSION  . ALLERGIC RHINITIS  . COPD  . GERD  . VENTRAL HERNIA  . OSTEOARTHRITIS, KNEE, RIGHT  . BACK PAIN  . SKIN CANCER, HX OF  . COLONIC POLYPS, HX OF  . Shingles    Past Medical History  Diagnosis Date  . Osteoarthrosis, unspecified whether generalized or localized, lower leg   . Chronic airway obstruction, not elsewhere classified   . Allergic rhinitis, cause unspecified   . Lipoma of unspecified site   . Carpal tunnel syndrome   . Unspecified essential hypertension   . Other and unspecified hyperlipidemia   . Esophageal reflux   . Personal history of colonic polyps   . Personal history of other malignant neoplasm of skin     Past Surgical History  Procedure Date  . Total knee arthroplasty     03-2009 [facility]  . Bone spur 06-2003    right thumb  . Shoulder surgery 03-24-2007    removal bone spur  . Knee arthroscopy 09-2007    partial medial mesicecotmy, patellar chonfroplasty    History  Substance Use Topics  . Smoking status: Former Smoker -- 3.0 packs/day for 75 years  . Smokeless tobacco: Never Used  . Alcohol Use: Yes     Comment: 4 mixed drinks per week    Family History  Problem Relation Age of Onset  . Diabetes Mother   . Hyperlipidemia Mother   . Hypertension Mother   . Heart attack Mother   . Obesity Mother   . Hyperlipidemia Father   . Hypertension Father   . Cancer Father     lung  . Hypertension Sister   . Hyperlipidemia Sister   . Hyperlipidemia Brother   . Hypertension Brother   .  Hyperlipidemia Brother   . Hypertension Brother   . Cancer Sister     lung  . Hyperlipidemia Sister     No Known Allergies  Current Outpatient Prescriptions on File Prior to Visit  Medication Sig Dispense Refill  . amLODipine (NORVASC) 5 MG tablet Take 1 tablet (5 mg total) by mouth daily.  30 tablet  7  . aspirin 81 MG tablet Take 81 mg by mouth daily.        Marland Kitchen atorvastatin (LIPITOR) 40 MG tablet Take 1 tablet (40 mg total) by mouth daily.  30 tablet  7  . diclofenac (VOLTAREN) 75 MG EC tablet Take 75 mg by mouth 2 (two) times daily as needed. For pain      . fish oil-omega-3 fatty acids 1000 MG capsule Take 2 g by mouth daily.        Marland Kitchen lisinopril (PRINIVIL,ZESTRIL) 40 MG tablet Take 1 tablet (40 mg total) by mouth daily.  30 tablet  6  . Multiple Vitamin (MULTIVITAMIN) tablet Take 1 tablet by mouth daily.        Marland Kitchen omeprazole (PRILOSEC) 20 MG capsule Take 20 mg by mouth daily.        . sertraline (ZOLOFT) 50 MG tablet Take 1 tablet (50 mg total) by mouth daily.  30 tablet  7  . Travoprost, BAK Free, (TRAVATAN) 0.004 % SOLN ophthalmic solution Place 1 drop into both eyes at bedtime.      . sildenafil (VIAGRA) 100 MG tablet Take 1 tablet (100 mg total) by mouth daily as needed for erectile dysfunction.  10 tablet  11     ROS: GEN: Acute illness details above GI: Tolerating PO intake GU: maintaining adequate hydration and urination Pulm: No SOB Interactive and getting along well at home.  Otherwise, ROS is as per the HPI.   Physical Exam  Blood pressure 120/76, pulse 82, temperature 98.5 F (36.9 C), temperature source Oral, height 5\' 9"  (1.753 m), weight 156 lb 12.8 oz (71.124 kg), SpO2 98.00%.  Gen: WDWN, NAD; A & O x3, cooperative. Pleasant.Globally Non-toxic HEENT: Normocephalic and atraumatic. Throat: swollen tonsills with exudate R TM clear, L TM - good landmarks, No fluid present. rhinnorhea. No frontal or maxillary sinus T. MMM NECK: Anterior cervical  LAD is present -  TTP CV: RRR, No M/G/R, cap refill <2 sec PULM: Breathing comfortably in no respiratory distress. no wheezing, crackles, rhonchi EXT: No c/c/e PSYCH: Friendly, good eye contact MSK: Nml gait   A/P: Strep Throat with concominant URI Strep test + Treat with ABX Supportive care  Results for orders placed in visit on 03/27/12  POCT RAPID STREP A (OFFICE)      Component Value Range   Rapid Strep A Screen Positive (*) Negative     1. Strep throat    2. Sore throat  POCT rapid strep A  3. URI (upper respiratory infection)      Orders Today:  Orders Placed This Encounter  Procedures  . POCT rapid strep A    Updated Medication List: (Includes new medications, updates to list, dose adjustments) Meds ordered this encounter  Medications  . amoxicillin (AMOXIL) 875 MG tablet    Sig: Take 1 tablet (875 mg total) by mouth 2 (two) times daily.    Dispense:  20 tablet    Refill:  0    Medications Discontinued: Medications Discontinued During This Encounter  Medication Reason  . HYDROcodone-ibuprofen (VICOPROFEN) 7.5-200 MG per tablet Error

## 2012-06-03 ENCOUNTER — Other Ambulatory Visit: Payer: Self-pay | Admitting: *Deleted

## 2012-06-03 MED ORDER — LISINOPRIL 40 MG PO TABS: 40.0000 mg | ORAL_TABLET | Freq: Every day | ORAL | Status: DC

## 2012-06-30 ENCOUNTER — Ambulatory Visit (INDEPENDENT_AMBULATORY_CARE_PROVIDER_SITE_OTHER): Payer: Medicare Other | Admitting: Family Medicine

## 2012-06-30 ENCOUNTER — Encounter: Payer: Self-pay | Admitting: Family Medicine

## 2012-06-30 VITALS — BP 138/80 | HR 91 | Temp 97.8°F | Ht 70.0 in | Wt 251.0 lb

## 2012-06-30 DIAGNOSIS — M1710 Unilateral primary osteoarthritis, unspecified knee: Secondary | ICD-10-CM

## 2012-06-30 DIAGNOSIS — M19071 Primary osteoarthritis, right ankle and foot: Secondary | ICD-10-CM

## 2012-06-30 DIAGNOSIS — Z1211 Encounter for screening for malignant neoplasm of colon: Secondary | ICD-10-CM

## 2012-06-30 DIAGNOSIS — M19079 Primary osteoarthritis, unspecified ankle and foot: Secondary | ICD-10-CM

## 2012-06-30 DIAGNOSIS — M1712 Unilateral primary osteoarthritis, left knee: Secondary | ICD-10-CM

## 2012-06-30 NOTE — Progress Notes (Addendum)
Nature conservation officer at [facility] [address] [place] [address] Phone: [phone] Fax: [phone]  Date:  06/30/2012   Name:  Alan [name]   DOB:  1945/06/13   MRN:  [id number] Gender: male Age: 67 y.o.  Primary Physician:  Alan Beat, MD  Evaluating MD: Alan Beat, MD   Chief Complaint: Knee Pain   History of Present Illness:  Alan [name] is a 67 y.o. pleasant patient who presents with the following:  Larey Seat on middle of December. Landed on - R knee, which is s/p TKA, but ultimately it became the left knee that started to bother him more. He had known OA to a mild degree on his last Xrays, and he is now having pain with deep flexion. Minimal effusion. Notices it more when in the bed, not really with bothering and with walking.   R MTP has been bothering him for a long time, 1st MTP. We have tried putting in some sports insoles and put a MT bar, which may have helped some. No redness, warmth, or history of gout.  Patient Active Problem List  Diagnosis  . LIPOMA, BACK  . HYPERLIPIDEMIA  . CARPAL TUNNEL SYNDROME, BILATERAL  . HYPERTENSION  . ALLERGIC RHINITIS  . COPD  . GERD  . VENTRAL HERNIA  . OSTEOARTHRITIS, KNEE, RIGHT  . BACK PAIN  . SKIN CANCER, HX OF  . COLONIC POLYPS, HX OF  . Shingles    Past Medical History  Diagnosis Date  . Osteoarthrosis, unspecified whether generalized or localized, lower leg   . Chronic airway obstruction, not elsewhere classified   . Allergic rhinitis, cause unspecified   . Lipoma of unspecified site   . Carpal tunnel syndrome   . Unspecified essential hypertension   . Other and unspecified hyperlipidemia   . Esophageal reflux   . Personal history of colonic polyps   . Personal history of other malignant neoplasm of skin     Past Surgical History  Procedure Laterality Date  . Total knee arthroplasty      03-2009 [facility]  . Bone spur  06-2003    right thumb  . Shoulder surgery  03-24-2007   removal bone spur  . Knee arthroscopy  09-2007    partial medial mesicecotmy, patellar chonfroplasty    History   Social History  . Marital Status: Married    Spouse Name: N/A    Number of Children: 2  . Years of Education: N/A   Occupational History  . [employer]    Social History Main Topics  . Smoking status: Former Smoker -- 3.00 packs/day for 75 years  . Smokeless tobacco: Never Used  . Alcohol Use: Yes     Comment: 4 mixed drinks per week  . Drug Use: No  . Sexually Active: Not on file   Other Topics Concern  . Not on file   Social History Narrative   Regular exercise -- no             Family History  Problem Relation Age of Onset  . Diabetes Mother   . Hyperlipidemia Mother   . Hypertension Mother   . Heart attack Mother   . Obesity Mother   . Hyperlipidemia Father   . Hypertension Father   . Cancer Father     lung  . Hypertension Sister   . Hyperlipidemia Sister   . Hyperlipidemia Brother   . Hypertension Brother   . Hyperlipidemia Brother   . Hypertension  Brother   . Cancer Sister     lung  . Hyperlipidemia Sister     No Known Allergies  Medication list has been reviewed and updated.  Outpatient Prescriptions Prior to Visit  Medication Sig Dispense Refill  . amLODipine (NORVASC) 5 MG tablet Take 1 tablet (5 mg total) by mouth [name].  30 tablet  7  . aspirin 81 MG tablet Take 81 mg by mouth [name].        Marland Kitchen atorvastatin (LIPITOR) 40 MG tablet Take 1 tablet (40 mg total) by mouth [name].  30 tablet  7  . diclofenac (VOLTAREN) 75 MG EC tablet Take 75 mg by mouth 2 (two) times [name] as needed. For pain      . fish oil-omega-3 fatty acids 1000 MG capsule Take 2 g by mouth [name].        Marland Kitchen lisinopril (PRINIVIL,ZESTRIL) 40 MG tablet Take 1 tablet (40 mg total) by mouth [name].  30 tablet  6  . Multiple Vitamin (MULTIVITAMIN) tablet Take 1 tablet by mouth [name].        Marland Kitchen omeprazole (PRILOSEC) 20 MG capsule Take 20 mg by mouth [name].         . sertraline (ZOLOFT) 50 MG tablet Take 1 tablet (50 mg total) by mouth [name].  30 tablet  7  . Travoprost, BAK Free, (TRAVATAN) 0.004 % SOLN ophthalmic solution Place 1 drop into both eyes at bedtime.      . sildenafil (VIAGRA) 100 MG tablet Take 1 tablet (100 mg total) by mouth [name] as needed for erectile dysfunction.  10 tablet  11  . amoxicillin (AMOXIL) 875 MG tablet Take 1 tablet (875 mg total) by mouth 2 (two) times [name].  20 tablet  0   No facility-administered medications prior to visit.    Review of Systems:   GEN: No fevers, chills. Nontoxic. Primarily MSK c/o today. MSK: Detailed in the HPI GI: tolerating PO intake without difficulty Neuro: No numbness, parasthesias, or tingling associated. Otherwise the pertinent positives of the ROS are noted above.    Physical Examination: BP 138/80  Pulse 91  Temp(Src) 97.8 F (36.6 C) (Oral)  Ht 5\' 10"  (1.778 m)  Wt 251 lb (113.853 kg)  BMI 36.01 kg/m2  SpO2 97%  Ideal Body Weight: Weight in (lb) to have BMI = 25: 173.9   GEN: WDWN, NAD, Non-toxic, Alert & Oriented x 3 HEENT: Atraumatic, Normocephalic.  Ears and Nose: No external deformity. EXTR: No clubbing/cyanosis/edema NEURO: Normal gait.  PSYCH: Normally interactive. Conversant. Not depressed or anxious appearing.  Calm demeanor.   Knee:  LEFT Gait: Normal heel toe pattern ROM: 0-125 Effusion: minimal Echymosis or edema: none Patellar tendon NT Painful PLICA: neg Patellar grind: pos Medial and lateral patellar facet loading: negative medial and lateral joint lines: mild medial ttp Mcmurray's neg Flexion-pinch pos Varus and valgus stress: stable Lachman: neg Ant and Post drawer: neg Hip abduction, IR, ER: WNL Hip flexion str: 5/5 Hip abd: 5/5 Quad: 5/5 VMO atrophy:No Hamstring concentric and eccentric: 5/5   R great toe, modest restriction of motion at 1st MTP  Assessment and Plan:  Osteoarthritis of left knee  Osteoarthritis of toe joint,  right  Discussed various treatments, including tylenol up to 4 times a day, ice, heat, and NSAIDS.  He had a good response from prior steroid injection, with relief > 1 year, so would like to try again. Reasonable.   He also wanted to try a 1st MTP injection, which we  have not done before.  He also needs repeat colon. Consult [facility].  Knee Injection, LEFT Patient verbally consented to procedure. Risks (including potential rare risk of infection), benefits, and alternatives explained. Sterilely prepped with Chloraprep. Ethyl cholride used for anesthesia. 8 cc Lidocaine 1% mixed with 2 cc of Depo-Medrol 40 mg injected using the anterolateral approach without difficulty. No complications with procedure and tolerated well. Patient had decreased pain post-injection.   Great Toe MTP Joint Injection, RIGHT Verbal consent obtained from patient. Risks (including rare risk of infection, potential risk of bleeding, rare risk of tendon injury), benefits, and alternatives reviewed. Prepped with Chloraprep. Ethyl Chloride for anesthesia. Great toe held in traction and plantarflexed 30 degrees. Under sterile conditions, needle inserted perpendicularly and 1/2 cc of Lidocaine 1% and 1/2 cc of Depo-Medrol 40 mg injected. No resistance met. No blood on aspiration. Decreased pain post-injection and better motion without pain.  Orders Today:  No orders of the defined types were placed in this encounter.    Updated Medication List: (Includes new medications, updates to list, dose adjustments) Meds ordered this encounter  Medications  . latanoprost (XALATAN) 0.005 % ophthalmic solution    Sig: As directed    Medications Discontinued: Medications Discontinued During This Encounter  Medication Reason  . amoxicillin (AMOXIL) 875 MG tablet Error      Signed, Joron Velis T. Doratha Mcswain, MD 06/30/2012 10:03 AM

## 2012-07-01 NOTE — Addendum Note (Signed)
Addended by: Hannah Beat on: 07/01/2012 05:05 PM   Modules accepted: Orders

## 2012-07-08 ENCOUNTER — Other Ambulatory Visit: Payer: Self-pay | Admitting: *Deleted

## 2012-07-08 MED ORDER — SERTRALINE HCL 50 MG PO TABS: 50.0000 mg | ORAL_TABLET | Freq: Every day | ORAL | Status: DC

## 2012-07-08 MED ORDER — AMLODIPINE BESYLATE 5 MG PO TABS: 5.0000 mg | ORAL_TABLET | Freq: Every day | ORAL | Status: DC

## 2012-07-08 NOTE — Addendum Note (Signed)
Addended by: Baldomero Lamy on: 07/08/2012 11:38 AM   Modules accepted: Orders

## 2012-07-17 ENCOUNTER — Encounter: Payer: Self-pay | Admitting: Family Medicine

## 2012-07-24 ENCOUNTER — Encounter: Payer: Self-pay | Admitting: Internal Medicine

## 2012-07-28 ENCOUNTER — Encounter: Payer: Self-pay | Admitting: Internal Medicine

## 2012-07-28 ENCOUNTER — Ambulatory Visit (INDEPENDENT_AMBULATORY_CARE_PROVIDER_SITE_OTHER): Payer: Medicare Other | Admitting: Internal Medicine

## 2012-07-28 VITALS — BP 132/80 | HR 91 | Ht 70.0 in | Wt 256.0 lb

## 2012-07-28 DIAGNOSIS — Z860100 Personal history of colon polyps, unspecified: Secondary | ICD-10-CM

## 2012-07-28 DIAGNOSIS — K219 Gastro-esophageal reflux disease without esophagitis: Secondary | ICD-10-CM

## 2012-07-28 DIAGNOSIS — Z1211 Encounter for screening for malignant neoplasm of colon: Secondary | ICD-10-CM

## 2012-07-28 MED ORDER — PEG-KCL-NACL-NASULF-NA ASC-C 100 G PO SOLR: 1.0000 | Freq: Once | ORAL | Status: DC

## 2012-07-28 NOTE — Patient Instructions (Addendum)
You have been scheduled for an endoscopy/colonoscopy with propofol. Please follow written instructions given to you at your visit today. If you use inhalers (even only as needed), please bring them with you on the day of your procedure. Your physician has requested that you go to www.[email or web address] and enter the access code given to you at your visit today. This web site gives a general overview about your procedure. However, you should still follow specific instructions given to you by our office regarding your preparation for the procedure.  We have sent the following medications to your pharmacy for you to pick up at your convenience: [email or web address]                                               We are excited to introduce MyChart, a new best-in-class service that provides you online access to important information in your electronic medical record. We want to make it easier for you to view your health information - all in one secure location - when and where you need it. We expect MyChart will enhance the quality of care and service we provide.  When you register for MyChart, you can:    View your test results.    Request appointments and receive appointment reminders via email.    Request medication renewals.    View your medical history, allergies, medications and immunizations.    Communicate with your physician's office through a password-protected site.    Conveniently print information such as your medication lists.  To find out if MyChart is right for you, please talk to a member of our clinical staff today. We will gladly answer your questions about this free health and wellness tool.  If you are age 66 or older and want a member of your family to have access to your record, you must provide written consent by completing a proxy form available at our office. Please speak to our clinical staff about guidelines regarding accounts for patients younger than age 61.  As you activate your  MyChart account and need any technical assistance, please call the MyChart technical support line at [phone]) or email your question to [email or web address]. If you email your question(s), please include your name, a return phone number and the best time to reach you.  If you have non-urgent health-related questions, you can send a message to our office through MyChart at [email or web address]. If you have a medical emergency, call 911.  Thank you for using MyChart as your new health and wellness resource!   MyChart licensed from [employer],  [id number]. Patents Pending.

## 2012-07-28 NOTE — Progress Notes (Signed)
[name], male   DOB: 11/19/1945, 67 y.o.   MRN: [id number] HPI: Alan [name] is a 67 year old male with a past medical history of colonic polyps, hypertension, hyperlipidemia, GERD who seen in consultation at the request of Dr. Patsy Lager for evaluation of surveillance colonoscopy.  The patient presents alone today. He reports that he is feeling well, but feels that he is due for surveillance colonoscopy. He recalls 2 previous colonoscopies, the last being in 2007. He recalls polyps being removed, but did not require further intervention. He is feeling well. He has retired in the last 6 months and he reports with this he has gained about 12 pounds. He also had a mild change in his bowel habits. Previously he was going twice per day, once in the morning and once in the evening, but now he is going one to 3 times in the morning and then again in the evening. He occasionally sees very scant red blood on the toilet tissue only. He denies abdominal pain. No melena. No fevers or chills. Good appetite. No trouble with dysphagia or odynophagia. He has long-standing reflux disease which is very well controlled on omeprazole. He reports if he misses a dose he does have significant heartburn. He's had no nausea or vomiting early satiety.  The patient inquires about upper endoscopy given his history of heartburn.  Past Medical History  Diagnosis Date  . Osteoarthrosis, unspecified whether generalized or localized, lower leg   . Chronic airway obstruction, not elsewhere classified   . Allergic rhinitis, cause unspecified   . Lipoma of unspecified site   . Carpal tunnel syndrome   . Unspecified essential hypertension   . Other and unspecified hyperlipidemia   . Esophageal reflux   . Personal history of colonic polyps   . Personal history of other malignant neoplasm of skin     Past Surgical History  Procedure Laterality Date  . Total knee arthroplasty      03-2009 [facility]  . Bone spur  06-2003     right thumb  . Shoulder surgery  03-24-2007    removal bone spur  . Knee arthroscopy  09-2007    partial medial mesicecotmy, patellar chonfroplasty    Current Outpatient Prescriptions  Medication Sig Dispense Refill  . amLODipine (NORVASC) 5 MG tablet Take 1 tablet (5 mg total) by mouth daily.  30 tablet  5  . aspirin 81 MG tablet Take 81 mg by mouth daily.        Marland Kitchen atorvastatin (LIPITOR) 40 MG tablet Take 1 tablet (40 mg total) by mouth daily.  30 tablet  7  . diclofenac (VOLTAREN) 75 MG EC tablet Take 75 mg by mouth 2 (two) times daily as needed. For pain      . fish oil-omega-3 fatty acids 1000 MG capsule Take 2 g by mouth daily.        Marland Kitchen latanoprost (XALATAN) 0.005 % ophthalmic solution As directed      . lisinopril (PRINIVIL,ZESTRIL) 40 MG tablet Take 1 tablet (40 mg total) by mouth daily.  30 tablet  6  . Multiple Vitamin (MULTIVITAMIN) tablet Take 1 tablet by mouth daily.        Marland Kitchen omeprazole (PRILOSEC) 20 MG capsule Take 20 mg by mouth daily.        . sertraline (ZOLOFT) 50 MG tablet Take 1 tablet (50 mg total) by mouth daily.  30 tablet  3  . sildenafil (VIAGRA) 100 MG tablet Take 1 tablet (100 mg total)  by mouth daily as needed for erectile dysfunction.  10 tablet  11  . Travoprost, BAK Free, (TRAVATAN) 0.004 % SOLN ophthalmic solution Place 1 drop into both eyes at bedtime.      . peg 3350 powder (MOVIPREP) 100 G SOLR Take 1 kit (100 g total) by mouth once.  1 kit  0   No current facility-administered medications for this visit.    No Known Allergies  Family History  Problem Relation Age of Onset  . Diabetes Mother   . Hyperlipidemia Mother   . Hypertension Mother   . Heart attack Mother   . Obesity Mother   . Hyperlipidemia Father   . Hypertension Father   . Lung cancer Father   . Hypertension Sister   . Hyperlipidemia Sister   . Hyperlipidemia Brother   . Hypertension Brother   . Hyperlipidemia Brother   . Hypertension Brother   . Lung cancer Sister   .  Hyperlipidemia Sister   . Colon polyps Father     History  Substance Use Topics  . Smoking status: Former Smoker -- 3.00 packs/day for 75 years  . Smokeless tobacco: Never Used  . Alcohol Use: Yes     Comment: 4 mixed drinks per week    ROS: As per history of present illness, otherwise negative  BP 132/80  Pulse 91  Ht 5\' 10"  (1.778 m)  Wt 256 lb (116.121 kg)  BMI 36.73 kg/m2 Constitutional: Well-developed and well-nourished. No distress. HEENT: Normocephalic and atraumatic. Oropharynx is clear and moist. No oropharyngeal exudate. Conjunctivae are normal.  No scleral icterus. Neck: Neck supple. Trachea midline. Cardiovascular: Normal rate, regular rhythm and intact distal pulses. No M/R/G Pulmonary/chest: Effort normal and breath sounds normal. No wheezing, rales or rhonchi. Abdominal: Soft, nontender, nondistended. Bowel sounds active throughout. There are no masses palpable. No hepatosplenomegaly. Extremities: no clubbing, cyanosis, or edema Lymphadenopathy: No cervical adenopathy noted. Neurological: Alert and oriented to person place and time. Skin: Skin is warm and dry. No rashes noted. Psychiatric: Normal mood and affect. Behavior is normal.  RELEVANT LABS AND IMAGING: CBC    Component Value Date/Time   WBC 6.5 03/19/2012 2257   RBC 4.20* 03/19/2012 2257   HGB 12.6* 03/19/2012 2312   HCT 37.0* 03/19/2012 2312   PLT 162 03/19/2012 2257   MCV 90.0 03/19/2012 2257   MCH 30.5 03/19/2012 2257   MCHC 33.9 03/19/2012 2257   RDW 12.8 03/19/2012 2257   LYMPHSABS 1.6 03/19/2012 2257   MONOABS 0.4 03/19/2012 2257   EOSABS 0.2 03/19/2012 2257   BASOSABS 0.0 03/19/2012 2257    CMP     Component Value Date/Time   NA 140 03/19/2012 2312   K 4.1 03/19/2012 2312   CL 106 03/19/2012 2312   CO2 26 12/05/2011 0918   GLUCOSE 102* 03/19/2012 2312   BUN 21 03/19/2012 2312   CREATININE 0.80 03/19/2012 2312   CALCIUM 10.0 12/05/2011 0918   PROT 7.3 12/05/2011 0918   ALBUMIN 4.3  12/05/2011 0918   AST 30 12/05/2011 0918   ALT 36 12/05/2011 0918   ALKPHOS 53 12/05/2011 0918   BILITOT 0.6 12/05/2011 0918   GFRNONAA 113.43 10/25/2009 0820   GFRAA 147 01/06/2008 1215   Colonoscopy report dated 08/20/2005, Dr. Rodena Piety -- exam to the cecum. 12 mm to 13 mm size polyps move in the distal ascending colon and from sigmoid colon using hot forceps. --Pathology results were not received with his colonoscopy, but have been requested  ASSESSMENT/PLAN: 67 year old  male with a past medical history of colonic polyps, hypertension, hyperlipidemia, GERD who seen in consultation at the request of Dr. Patsy Lager for evaluation of surveillance colonoscopy.  1.  Polyp surveillance/CRC screening -- we have requested pathology results from his colonoscopy in 2007, however several of these polyps were greater than 1 cm in length and located in the right colon making adenoma more likely. It does seem that he is due, perhaps overdue for screening/surveillance colonoscopy.  We discussed this tested including the risks and benefits and he is agreeable to proceed.  2.  History of GERD -- the patient is a middle-aged white male with a long-standing history of reflux disease. It is currently well controlled on PPI. There is data to suggest once screening upper endoscopy to rule out Barrett's in patient's such as he.  We discussed upper endoscopy, including the risks and benefits and he is agreeable to proceed. This will be performed on the same day as his colonoscopy. He will continue omeprazole 20 mg daily.

## 2012-07-31 ENCOUNTER — Encounter: Payer: Self-pay | Admitting: Internal Medicine

## 2012-08-05 ENCOUNTER — Encounter: Payer: Self-pay | Admitting: Internal Medicine

## 2012-08-05 ENCOUNTER — Ambulatory Visit (AMBULATORY_SURGERY_CENTER): Payer: Medicare Other | Admitting: Internal Medicine

## 2012-08-05 VITALS — BP 119/79 | HR 78 | Temp 98.7°F | Resp 37 | Ht 70.0 in | Wt 256.0 lb

## 2012-08-05 DIAGNOSIS — Z860100 Personal history of colon polyps, unspecified: Secondary | ICD-10-CM

## 2012-08-05 DIAGNOSIS — D126 Benign neoplasm of colon, unspecified: Secondary | ICD-10-CM

## 2012-08-05 DIAGNOSIS — Z1211 Encounter for screening for malignant neoplasm of colon: Secondary | ICD-10-CM

## 2012-08-05 DIAGNOSIS — D130 Benign neoplasm of esophagus: Secondary | ICD-10-CM

## 2012-08-05 DIAGNOSIS — K635 Polyp of colon: Secondary | ICD-10-CM

## 2012-08-05 DIAGNOSIS — K219 Gastro-esophageal reflux disease without esophagitis: Secondary | ICD-10-CM

## 2012-08-05 MED ORDER — SODIUM CHLORIDE 0.9 % IV SOLN: 500.0000 mL | INTRAVENOUS | Status: DC

## 2012-08-05 NOTE — Op Note (Signed)
 [facility] [address] [place], [address]   COLONOSCOPY PROCEDURE REPORT  PATIENT: Alan, [name]  MR#: [id number] BIRTHDATE: 1945-09-13 , 67  yrs. old GENDER: Male ENDOSCOPIST: Beverley Fiedler, MD REFERRED [id number], Elpidio Galea. PROCEDURE DATE:  08/05/2012 PROCEDURE:   Colonoscopy with snare polypectomy ASA CLASS:   Class III INDICATIONS:Patient's personal history of adenomatous colon polyps.  MEDICATIONS: MAC sedation, administered by CRNA and propofol (Diprivan) 300mg  IV  DESCRIPTION OF PROCEDURE:   After the risks benefits and alternatives of the procedure were thoroughly explained, informed consent was obtained.  A digital rectal exam revealed no rectal mass.   The [facility] [id number]  endoscope was introduced through the anus and advanced to the cecum, which was identified by both the appendix and ileocecal valve. No adverse events experienced. The quality of the prep was good, using MoviPrep  The instrument was then slowly withdrawn as the colon was fully examined.   COLON FINDINGS: Two sessile polyps measuring 5-7 mm in size were found in the transverse colon.  Polypectomy was performed using cold snare.  All resections were complete and all polyp tissue was completely retrieved.   Mild diverticulosis was noted in the transverse colon, descending colon, and sigmoid colon.  Retroflexed views revealed no abnormalities. The time to cecum=2 minutes 01 seconds.  Withdrawal time=11 minutes 47 seconds.  The scope was withdrawn and the procedure completed. COMPLICATIONS: There were no complications.  ENDOSCOPIC IMPRESSION: 1.   Two sessile polyps measuring 5-7 mm in size were found in the transverse colon; Polypectomy was performed using cold snare 2.   Mild diverticulosis was noted in the transverse colon, descending colon, and sigmoid colon  RECOMMENDATIONS: 1.  Hold aspirin, aspirin products, and anti-inflammatory medication for 1 week. 2.   Await pathology results 3.  High fiber diet 4.  Given your personal history of adenomatous (pre-cancerous) polyps, you will need a repeat colonoscopy in 5 years. 5.  You will receive a letter within 1-2 weeks with the results of your biopsy as well as final recommendations.  Please call my office if you have not received a letter after 3 weeks.   eSigned:  Beverley Fiedler, MD 08/05/2012 3:45 PM  cc: The Patient

## 2012-08-05 NOTE — Patient Instructions (Addendum)

## 2012-08-05 NOTE — Op Note (Signed)
[facility] [address] [place], [address]   ENDOSCOPY PROCEDURE REPORT  PATIENT: Alan [name], Alan [name]  MR#: [id number] BIRTHDATE: 1945-10-29 , 67  yrs. old GENDER: Male ENDOSCOPIST: Beverley Fiedler, MD REFERRED BY: PROCEDURE DATE:  08/05/2012 PROCEDURE:  EGD w/ biopsy ASA CLASS:     Class III INDICATIONS:  history of GERD. MEDICATIONS: MAC sedation, administered by CRNA and propofol (Diprivan) 300mg  IV TOPICAL ANESTHETIC: Cetacaine Spray  DESCRIPTION OF PROCEDURE: After the risks benefits and alternatives of the procedure were thoroughly explained, informed consent was obtained.  The [facility] [id number] endoscope was introduced through the mouth and advanced to the second portion of the duodenum. Without limitations.  The instrument was slowly withdrawn as the mucosa was fully examined.        ESOPHAGUS: A Schatzki ring was found 44 cm from the incisors and was widely open.   An slightly irregular Z-line was observed 44 cm from the incisors.  A biopsy was performed using cold forceps.  Sample sent for histology.   The esophagus was otherwise normal.   A 2 cm hiatal hernia was noted.  STOMACH: The mucosa of the stomach appeared normal.  DUODENUM: The duodenal mucosa showed no abnormalities in the bulb and second portion of the duodenum.  Retroflexed views revealed no abnormalities.     The scope was then withdrawn from the patient and the procedure completed.  COMPLICATIONS: There were no complications. ENDOSCOPIC IMPRESSION: 1.   Non-obstructing Schatzki ring was found 44 cm from the incisors  2.   Irregular Z-line was observed 44 cm from the incisors; biopsy 3.   The esophagus was otherwise normal. 4.   2 cm hiatal hernia 5.   The mucosa of the stomach appeared normal 6.   The duodenal mucosa showed no abnormalities in the bulb and second portion of the duodenum  RECOMMENDATIONS: 1.  Await pathology results 2.  Continue taking your PPI  (antiacid medicine) once daily.  It is best to be taken 20-30 minutes prior to breakfast meal. 3.  Esophageal dilation can be performed if symptoms of dysphagia develop  eSigned:  Beverley Fiedler, MD 08/05/2012 3:41 PM   CC:The Patient

## 2012-08-05 NOTE — Progress Notes (Signed)
Called to room to assist during endoscopic procedure.  Patient ID and intended procedure confirmed with present staff. Received instructions for my participation in the procedure from the performing physician.  

## 2012-08-05 NOTE — Progress Notes (Signed)
Patient did not experience any of the following events: a burn prior to discharge; a fall within the facility; wrong site/side/patient/procedure/implant event; or a hospital transfer or hospital admission upon discharge from the facility. (G8907) Patient did not have preoperative order for IV antibiotic SSI prophylaxis. (G8918)  

## 2012-08-06 ENCOUNTER — Telehealth: Payer: Self-pay | Admitting: *Deleted

## 2012-08-06 NOTE — Telephone Encounter (Signed)
  Follow up Call-  Call back number 08/05/2012  Post procedure Call Back phone  # [phone]  Permission to leave phone message Yes     Patient questions:  Do you have a fever, pain , or abdominal swelling? no Pain Score  0 *  Have you tolerated food without any problems? yes  Have you been able to return to your normal activities? yes  Do you have any questions about your discharge instructions: Diet   no Medications  no Follow up visit  no  Do you have questions or concerns about your Care? no  Actions: * If pain score is 4 or above: No action needed, pain <4.

## 2012-08-08 ENCOUNTER — Other Ambulatory Visit: Payer: Self-pay | Admitting: *Deleted

## 2012-08-08 DIAGNOSIS — E785 Hyperlipidemia, unspecified: Secondary | ICD-10-CM

## 2012-08-08 MED ORDER — ATORVASTATIN CALCIUM 40 MG PO TABS: 40.0000 mg | ORAL_TABLET | Freq: Every day | ORAL | Status: DC

## 2012-08-11 ENCOUNTER — Encounter: Payer: Self-pay | Admitting: Internal Medicine

## 2012-08-13 ENCOUNTER — Other Ambulatory Visit: Payer: Self-pay | Admitting: *Deleted

## 2012-08-13 MED ORDER — DICLOFENAC SODIUM 75 MG PO TBEC: 75.0000 mg | DELAYED_RELEASE_TABLET | Freq: Two times a day (BID) | ORAL | Status: DC | PRN

## 2012-08-13 NOTE — Telephone Encounter (Signed)
Last filled 02/25/12 

## 2012-08-28 ENCOUNTER — Ambulatory Visit (INDEPENDENT_AMBULATORY_CARE_PROVIDER_SITE_OTHER): Payer: Medicare Other | Admitting: Family Medicine

## 2012-08-28 ENCOUNTER — Encounter: Payer: Self-pay | Admitting: Family Medicine

## 2012-08-28 VITALS — BP 132/76 | HR 81 | Temp 98.2°F | Wt 250.5 lb

## 2012-08-28 DIAGNOSIS — M25569 Pain in unspecified knee: Secondary | ICD-10-CM

## 2012-08-28 DIAGNOSIS — M25562 Pain in left knee: Secondary | ICD-10-CM

## 2012-08-28 NOTE — Progress Notes (Signed)
[facility] conservation officer at [facility] [address] [place] [address] Phone: [phone] Fax: [phone]  Date:  08/28/2012   Name:  Alan [name]   DOB:  1945/05/24   MRN:  [id number] Gender: male Age: 67 y.o.  Primary Physician:  Hannah Beat, MD  Evaluating MD: Hannah Beat, MD   Chief Complaint: Knee Problem   History of Present Illness:  Alan [name] is a 67 y.o. pleasant patient who presents with the following:  2 x 2 cm knot on top of left knee.   For approximately one week, the patient has had a small elevated, freely movable area superior to the knee this mildly to minimally tender  To palpation. He's also having some pain with walking. No trauma or injury to the knee. He does have a known history of mild arthritis. 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 The currently involved his LEFT knee.  Wt Readings from Last 3 Encounters:  08/28/12 250 lb 8 oz (113.626 kg)  08/05/12 256 lb (116.121 kg)  07/28/12 256 lb (116.121 kg)     Patient Active Problem List   Diagnosis Date Noted  . BACK PAIN 09/20/2009  . VENTRAL HERNIA 10/27/2008  . OSTEOARTHRITIS, KNEE, RIGHT 07/23/2008  . COPD 10/02/2007  . ALLERGIC RHINITIS 07/21/2007  . LIPOMA, BACK 12/24/2006  . HYPERLIPIDEMIA 09/30/2006  . CARPAL TUNNEL SYNDROME, BILATERAL 09/30/2006  . HYPERTENSION 09/30/2006  . GERD 09/30/2006  . SKIN CANCER, HX OF 09/30/2006  . COLONIC POLYPS, HX OF 09/30/2006    Past Medical History  Diagnosis Date  . Osteoarthrosis, unspecified whether generalized or localized, lower leg   . Chronic airway obstruction, not elsewhere classified   . Allergic rhinitis, cause unspecified   . Lipoma of unspecified site   . Carpal tunnel syndrome   . Unspecified essential hypertension   . Other and unspecified hyperlipidemia   . Esophageal reflux   . Personal history of colonic polyps   . Personal history of other malignant neoplasm of skin   . Glaucoma  (increased eye pressure)     Past Surgical History  Procedure Laterality Date  . Total knee arthroplasty      03-2009 [facility]  . Bone spur  06-2003    right thumb  . Shoulder surgery  03-24-2007    removal bone spur  . Knee arthroscopy  09-2007    partial medial mesicecotmy, patellar chonfroplasty    History   Social History  . Marital Status: Married    Spouse Name: N/A    Number of Children: 2  . Years of Education: N/A   Occupational History  . [employer]    Social History Main Topics  . Smoking status: Former Smoker -- 3.00 packs/day for 75 years    Quit date: 11/23/1996  . Smokeless tobacco: Never Used  . Alcohol Use: Yes     Comment: 4 mixed drinks per week  . Drug Use: No  . Sexually Active: Not on file   Other Topics Concern  . Not on file   Social History Narrative   Regular exercise -- no             Family History  Problem Relation Age of Onset  . Diabetes Mother   . Hyperlipidemia Mother   . Hypertension Mother   . Heart attack Mother   . Obesity Mother   . Hyperlipidemia Father   . Hypertension Father   . Lung cancer Father   .  Hypertension Sister   . Hyperlipidemia Sister   . Hyperlipidemia Brother   . Hypertension Brother   . Hyperlipidemia Brother   . Hypertension Brother   . Lung cancer Sister   . Hyperlipidemia Sister   . Colon polyps Father     No Known Allergies  Medication list has been reviewed and updated.  Outpatient Prescriptions Prior to Visit  Medication Sig Dispense Refill  . amLODipine (NORVASC) 5 MG tablet Take 1 tablet (5 mg total) by mouth daily.  30 tablet  5  . aspirin 81 MG tablet Take 81 mg by mouth daily.        Marland Kitchen atorvastatin (LIPITOR) 40 MG tablet Take 1 tablet (40 mg total) by mouth daily.  30 tablet  5  . diclofenac (VOLTAREN) 75 MG EC tablet Take 1 tablet (75 mg total) by mouth 2 (two) times daily as needed. For pain  60 tablet  3  . fish oil-omega-3 fatty acids 1000 MG capsule Take 2  g by mouth daily.        Marland Kitchen latanoprost (XALATAN) 0.005 % ophthalmic solution As directed      . lisinopril (PRINIVIL,ZESTRIL) 40 MG tablet Take 1 tablet (40 mg total) by mouth daily.  30 tablet  6  . Multiple Vitamin (MULTIVITAMIN) tablet Take 1 tablet by mouth daily.        Marland Kitchen omeprazole (PRILOSEC) 20 MG capsule Take 20 mg by mouth daily.        . sertraline (ZOLOFT) 50 MG tablet Take 1 tablet (50 mg total) by mouth daily.  30 tablet  3  . Travoprost, BAK Free, (TRAVATAN) 0.004 % SOLN ophthalmic solution Place 1 drop into both eyes at bedtime.      . sildenafil (VIAGRA) 100 MG tablet Take 1 tablet (100 mg total) by mouth daily as needed for erectile dysfunction.  10 tablet  11   No facility-administered medications prior to visit.    Review of Systems:   GEN: No fevers, chills. Nontoxic. Primarily MSK c/o today. MSK: Detailed in the HPI GI: tolerating PO intake without difficulty Neuro: No numbness, parasthesias, or tingling associated. Otherwise the pertinent positives of the ROS are noted above.    Physical Examination: BP 132/76  Pulse 81  Temp(Src) 98.2 F (36.8 C) (Oral)  Wt 250 lb 8 oz (113.626 kg)  BMI 35.94 kg/m2  SpO2 97%  Ideal Body Weight:     GEN: WDWN, NAD, Non-toxic, Alert & Oriented x 3 HEENT: Atraumatic, Normocephalic.  Ears and Nose: No external deformity. EXTR: No clubbing/cyanosis/edema NEURO: Normal gait.  PSYCH: Normally interactive. Conversant. Not depressed or anxious appearing.  Calm demeanor.   L Knee: range of motion is from 0-125. Nontender and minimally tender on the medial joint line and lateral joint line. Stable to varus and valgus stress. Negative Lachman. Negative McMurray's.  No effusion. Nontender on the patellar tendon.  On the superior aspect of the superior pole of the patella there is a 2 x 2 cm freely movable area that is not warm and not red or hot. It is more superficial than the suprapatellar bursa.  Assessment and Plan: Left  knee pain   Mild knee pain with 2 x 2 cm moveable area, more c/w cyst - but I cannot give an exact name. Good knee motion and no other concerning signs. For now, cont NSAIDS, ice, ROM, move normally.  Caution given regarding if becomes warm, reddened, or severely painful to seek urgent medical attention.   Signed,  Calil Amor T. Hafsa Lohn, MD 08/28/2012 10:37 AM

## 2012-08-29 ENCOUNTER — Telehealth: Payer: Self-pay | Admitting: *Deleted

## 2012-08-29 NOTE — Telephone Encounter (Signed)
Patient calling and wants you to know that his leg is hurting worse and knot not any bigger but, he is leaving to go to the beach next Sunday and loves to walk on the beach and want to get this taken care of before then. Patient also wanted to know if he should be walking for exercise if it is hurting him. I advised patient if it hurts he shouldn't do it. Patient is coming in on Monday to see you about pain being worse.

## 2012-08-31 NOTE — Telephone Encounter (Signed)
eval in AM

## 2012-09-01 ENCOUNTER — Ambulatory Visit: Payer: Medicare Other | Admitting: Family Medicine

## 2012-09-03 ENCOUNTER — Ambulatory Visit (INDEPENDENT_AMBULATORY_CARE_PROVIDER_SITE_OTHER): Payer: Medicare Other | Admitting: Family Medicine

## 2012-09-03 ENCOUNTER — Encounter: Payer: Self-pay | Admitting: Family Medicine

## 2012-09-03 ENCOUNTER — Ambulatory Visit (INDEPENDENT_AMBULATORY_CARE_PROVIDER_SITE_OTHER)
Admission: RE | Admit: 2012-09-03 | Discharge: 2012-09-03 | Disposition: A | Discharge status: Home / Self Care | Payer: Medicare Other | Source: Ambulatory Visit | Attending: Family Medicine | Admitting: Family Medicine

## 2012-09-03 VITALS — BP 130/78 | HR 78 | Temp 98.3°F | Ht 70.0 in | Wt 252.8 lb

## 2012-09-03 DIAGNOSIS — M1712 Unilateral primary osteoarthritis, left knee: Secondary | ICD-10-CM

## 2012-09-03 DIAGNOSIS — M1710 Unilateral primary osteoarthritis, unspecified knee: Secondary | ICD-10-CM

## 2012-09-03 DIAGNOSIS — M25569 Pain in unspecified knee: Secondary | ICD-10-CM

## 2012-09-03 DIAGNOSIS — M25562 Pain in left knee: Secondary | ICD-10-CM

## 2012-09-03 DIAGNOSIS — IMO0002 Reserved for concepts with insufficient information to code with codable children: Secondary | ICD-10-CM

## 2012-09-03 NOTE — Progress Notes (Signed)
Nature conservation officer at [facility] [address] [place] [address] Phone: [phone] Fax: [phone]  Date:  09/03/2012   Name:  Alan [name]   DOB:  [birth year]   MRN:  [id number] Gender: male Age: 67 y.o.  Primary Physician:  Hannah Beat, MD  Evaluating MD: Hannah Beat, MD   Chief Complaint: Knee Pain   History of Present Illness:  Alan [name] is a 67 y.o. pleasant patient who presents with the following:  Left knee, pain worse: the cyst / swelling area is smaller and not bothering him much. Some knee pain, more medially. Some pain with walking. He has been walking more and recently was walking on the  [place] a lot a few weeks ago.  Prior OV: For approximately one week, the patient has had a small elevated, freely movable area superior to the knee this mildly to minimally tender  To palpation. He's also having some pain with walking. No trauma or injury to the knee. He does have a known history of mild arthritis. 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 The currently involved his LEFT knee.   Patient Active Problem List   Diagnosis Date Noted  . BACK PAIN 09/20/2009  . VENTRAL HERNIA 10/27/2008  . OSTEOARTHRITIS, KNEE, RIGHT 07/23/2008  . COPD 10/02/2007  . ALLERGIC RHINITIS 07/21/2007  . LIPOMA, BACK 12/24/2006  . HYPERLIPIDEMIA 09/30/2006  . CARPAL TUNNEL SYNDROME, BILATERAL 09/30/2006  . HYPERTENSION 09/30/2006  . GERD 09/30/2006  . SKIN CANCER, HX OF 09/30/2006  . COLONIC POLYPS, HX OF 09/30/2006    Past Medical History  Diagnosis Date  . Osteoarthrosis, unspecified whether generalized or localized, lower leg   . Chronic airway obstruction, not elsewhere classified   . Allergic rhinitis, cause unspecified   . Lipoma of unspecified site   . Carpal tunnel syndrome   . Unspecified essential hypertension   . Other and unspecified hyperlipidemia   . Esophageal reflux   . Personal history of colonic polyps   .  Personal history of other malignant neoplasm of skin   . Glaucoma (increased eye pressure)     Past Surgical History  Procedure Laterality Date  . Total knee arthroplasty      03-2009 [facility]  . Bone spur  06-2003    right thumb  . Shoulder surgery  03-24-2007    removal bone spur  . Knee arthroscopy  09-2007    partial medial mesicecotmy, patellar chonfroplasty    History   Social History  . Marital Status: Married    Spouse Name: N/A    Number of Children: 2  . Years of Education: N/A   Occupational History  . [employer]    Social History Main Topics  . Smoking status: Former Smoker -- 3.00 packs/day for 75 years    Quit date: 11/23/1996  . Smokeless tobacco: Never Used  . Alcohol Use: Yes     Comment: 4 mixed drinks per week  . Drug Use: No  . Sexually Active: Not on file   Other Topics Concern  . Not on file   Social History Narrative   Regular exercise -- no             Family History  Problem Relation Age of Onset  . Diabetes Mother   . Hyperlipidemia Mother   . Hypertension Mother   . Heart attack Mother   . Obesity Mother   . Hyperlipidemia Father   . Hypertension Father   .  Lung cancer Father   . Hypertension Sister   . Hyperlipidemia Sister   . Hyperlipidemia Brother   . Hypertension Brother   . Hyperlipidemia Brother   . Hypertension Brother   . Lung cancer Sister   . Hyperlipidemia Sister   . Colon polyps Father     No Known Allergies  Medication list has been reviewed and updated.  Outpatient Prescriptions Prior to Visit  Medication Sig Dispense Refill  . amLODipine (NORVASC) 5 MG tablet Take 1 tablet (5 mg total) by mouth daily.  30 tablet  5  . aspirin 81 MG tablet Take 81 mg by mouth daily.        Marland Kitchen atorvastatin (LIPITOR) 40 MG tablet Take 1 tablet (40 mg total) by mouth daily.  30 tablet  5  . fish oil-omega-3 fatty acids 1000 MG capsule Take 2 g by mouth daily.        Marland Kitchen latanoprost (XALATAN) 0.005 %  ophthalmic solution As directed      . lisinopril (PRINIVIL,ZESTRIL) 40 MG tablet Take 1 tablet (40 mg total) by mouth daily.  30 tablet  6  . Multiple Vitamin (MULTIVITAMIN) tablet Take 1 tablet by mouth daily.        Marland Kitchen omeprazole (PRILOSEC) 20 MG capsule Take 20 mg by mouth daily.        . sertraline (ZOLOFT) 50 MG tablet Take 1 tablet (50 mg total) by mouth daily.  30 tablet  3  . Travoprost, BAK Free, (TRAVATAN) 0.004 % SOLN ophthalmic solution Place 1 drop into both eyes at bedtime.      . sildenafil (VIAGRA) 100 MG tablet Take 1 tablet (100 mg total) by mouth daily as needed for erectile dysfunction.  10 tablet  11   No facility-administered medications prior to visit.    Review of Systems:   GEN: No fevers, chills. Nontoxic. Primarily MSK c/o today. MSK: Detailed in the HPI GI: tolerating PO intake without difficulty Neuro: No numbness, parasthesias, or tingling associated. Otherwise the pertinent positives of the ROS are noted above.    Physical Examination: BP 130/78  Pulse 78  Temp(Src) 98.3 F (36.8 C) (Oral)  Ht 5\' 10"  (1.778 m)  Wt 252 lb 12 oz (114.647 kg)  BMI 36.27 kg/m2  SpO2 95%  Ideal Body Weight: Weight in (lb) to have BMI = 25: 173.9   GEN: WDWN, NAD, Non-toxic, Alert & Oriented x 3 HEENT: Atraumatic, Normocephalic.  Ears and Nose: No external deformity. EXTR: No clubbing/cyanosis/edema NEURO: Normal gait.  PSYCH: Normally interactive. Conversant. Not depressed or anxious appearing.  Calm demeanor.   Knee:  L Gait: Normal heel toe pattern ROM: 0-125 Effusion: neg Echymosis or edema: none Patellar tendon NT Painful PLICA: neg Patellar grind: negative Medial and lateral patellar facet loading: negative medial and lateral joint lines: medial > lateral Some fullness superior to patella, no suprapatellar effusion truly, but smaller in size compared to last exam. Mcmurray's pain Flexion-pinch neg Varus and valgus stress: stable Lachman: neg Ant  and Post drawer: neg Hip abduction, IR, ER: WNL Hip flexion str: 5/5 Hip abd: 5/5 Quad: 5/5 VMO atrophy:No Hamstring concentric and eccentric: 5/5   Dg Knee Ap/lat W/sunrise Left  09/03/2012   *RADIOLOGY REPORT*  Clinical Data: Knee pain, osteoarthritis.  DG KNEE - 3 VIEWS  Comparison: 04/04/2011.  Findings: No joint effusion or fracture.  No osteophytosis.  There may be mild medial joint space narrowing.  Vascular calcifications are noted.  IMPRESSION: Minimal medial joint space narrowing,  as before.   Original Report Authenticated By: Leanna Battles, M.D.   Agree, mild OA, most noticeable in the medial compartment Karleen Hampshire Phinley Schall MD  Assessment and Plan:  Osteoarthritis of left knee - Plan: DG Knee AP/LAT W/Sunrise Left  Left knee pain  Seems most c/w oa flare, likely from [place] may be extension of synovial space like an anterior baker's cyst  Cont brace, ice, nsaids  Knee Injection, LEFT Patient verbally consented to procedure. Risks (including potential rare risk of infection), benefits, and alternatives explained. Sterilely prepped with Chloraprep. Ethyl cholride used for anesthesia. 8 cc Lidocaine 1% mixed with 2 cc of Depo-Medrol 40 mg injected using the anterolateral approach without difficulty. No complications with procedure and tolerated well. Patient had decreased pain post-injection.   Orders Today:  Orders Placed This Encounter  Procedures  . DG Knee AP/LAT W/Sunrise Left    Standing Status: Future     Number of Occurrences: 1     Standing Expiration Date: 11/03/2013    Order Specific Question:  Reason for exam:    Answer:  knee pain, oa    Order Specific Question:  Preferred imaging location?    Answer:  [facility]    Updated Medication List: (Includes new medications, updates to list, dose adjustments) No orders of the defined types were placed in this encounter.    Medications Discontinued: Medications Discontinued During  This Encounter  Medication Reason  . sildenafil (VIAGRA) 100 MG tablet Error      Signed, Layliana Devins T. Grisela Mesch, MD 09/03/2012 9:43 AM

## 2012-11-06 ENCOUNTER — Other Ambulatory Visit: Payer: Self-pay | Admitting: Family Medicine

## 2012-11-13 ENCOUNTER — Encounter: Payer: Self-pay | Admitting: Family Medicine

## 2012-11-13 ENCOUNTER — Ambulatory Visit (INDEPENDENT_AMBULATORY_CARE_PROVIDER_SITE_OTHER): Payer: Medicare Other | Admitting: Family Medicine

## 2012-11-13 VITALS — BP 110/70 | HR 80 | Temp 98.3°F | Wt 247.0 lb

## 2012-11-13 DIAGNOSIS — M25569 Pain in unspecified knee: Secondary | ICD-10-CM

## 2012-11-13 DIAGNOSIS — M25562 Pain in left knee: Secondary | ICD-10-CM

## 2012-11-13 MED ORDER — TRAMADOL HCL 50 MG PO TABS: 50.0000 mg | ORAL_TABLET | Freq: Two times a day (BID) | ORAL | Status: DC | PRN

## 2012-11-13 NOTE — Patient Instructions (Signed)
Let's do trial of tramadol for breakthrough pain (printed out script today). Touch base with Dr. Patsy Lager in a few weeks about steroid shot or referral to orthopedist. Good to see you today, I hope this pain medicine helps.

## 2012-11-13 NOTE — Assessment & Plan Note (Addendum)
Left anteriomedial knee pain in known h/o knee osteoarthritis - anticipate repeat flare.  Also with component of anserine bursitis. Too soon for rpt knee steroid injection. Discussed options including surgery in what sounds like worsening osteoarthritis - pt will think about this. Will prescribe tramadol prn breakthrough pain above aleve.  Encouraged continued use of aleve, brace and ice prn. I've asked him to touch base with PCP in next 1-2 wks on L knee.

## 2012-11-13 NOTE — Progress Notes (Addendum)
  Subjective:    [name], male    DOB: July 01, 1945, 67 y.o.   MRN: [id number]  HPI CC: L knee pain  Known mild medial OA of L knee, latest steroid injection was 09/03/2012 by PCP.  Steroid shot did help pain for about 1 month.    Has been working more in garden, thinks may have caused OA flare with this.  Currently pain medial and inferior knee joint.  Wakes him up at night.  Worse with weight bearing and with walking up hills.  Knee arthritis pain is limiting activities - unable to walk as much as he would like, unable to work as much in garden as he'd like.  Denies fevers/chills, erythema, edema or swelling.  Currently taking aleve 440mg  bid.  Prior has tried voltaren gel (didn't really help) and diclofenac and etodolac pills (too expensive).  NKDA.  Sensitive stomach. Uses ice and brace when pain flare.  H/o R TKR 2010.  Thinks may need upcoming surgery for left knee, but has been postponing (may think about this in the fall/winter).  Past Medical History  Diagnosis Date  . Osteoarthrosis, unspecified whether generalized or localized, lower leg   . Chronic airway obstruction, not elsewhere classified   . Allergic rhinitis, cause unspecified   . Lipoma of unspecified site   . Carpal tunnel syndrome   . Unspecified essential hypertension   . Other and unspecified hyperlipidemia   . Esophageal reflux   . Personal history of colonic polyps   . Personal history of other malignant neoplasm of skin   . Glaucoma (increased eye pressure)     Past Surgical History  Procedure Laterality Date  . Total knee arthroplasty      03-2009 [facility]  . Bone spur  06-2003    right thumb  . Shoulder surgery  03-24-2007    removal bone spur  . Knee arthroscopy  09-2007    partial medial mesicecotmy, patellar chonfroplasty   Review of Systems per HPI    Objective:   Physical Exam  Nursing note and vitals reviewed. Constitutional: He appears well-developed and well-nourished. No  distress.  Musculoskeletal: He exhibits no edema.       Left knee: He exhibits no ecchymosis, no deformity, normal patellar mobility and normal meniscus. Tenderness found. Medial joint line and patellar tendon tenderness noted.  R knee s/p replacement L knee - tender to palpation inferior to patella at tendon and at medial joint line. Swelling noted at medial joint line just superior to pes anserine bursa - tender to palpation. Neg mcmurrays Neg drawer No PFgrind No abnormal patellar mobility. Pain with full extension of knee       Assessment & Plan:

## 2012-12-18 ENCOUNTER — Other Ambulatory Visit: Payer: Self-pay | Admitting: Family Medicine

## 2012-12-24 ENCOUNTER — Ambulatory Visit: Payer: Self-pay | Admitting: General Practice

## 2012-12-24 DIAGNOSIS — I10 Essential (primary) hypertension: Secondary | ICD-10-CM

## 2012-12-24 LAB — URINALYSIS, COMPLETE
Glucose,UR: NEGATIVE mg/dL (ref 0–75)
Hyaline Cast: 2
Leukocyte Esterase: NEGATIVE
RBC,UR: <1 /HPF (ref 0–5)
Specific Gravity: 1.014 (ref 1.003–1.030)

## 2012-12-24 LAB — CBC
HGB: 13.6 g/dL (ref 13.0–18.0)
MCH: 31.7 pg (ref 26.0–34.0)
MCV: 91 fL (ref 80–100)
Platelet: 162 10*3/uL (ref 150–440)
RBC: 4.3 10*6/uL — ABNORMAL LOW (ref 4.40–5.90)
RDW: 12.8 % (ref 11.5–14.5)

## 2012-12-24 LAB — BASIC METABOLIC PANEL
BUN: 20 mg/dL — ABNORMAL HIGH (ref 7–18)
Calcium, Total: 9.8 mg/dL (ref 8.5–10.1)
Co2: 27 mmol/L (ref 21–32)
Creatinine: 0.88 mg/dL (ref 0.60–1.30)
EGFR (African American): >60
EGFR (Non-African Amer.): >60
Glucose: 94 mg/dL (ref 65–99)
Osmolality: 274 (ref 275–301)
Potassium: 4.2 mmol/L (ref 3.5–5.1)
Sodium: 136 mmol/L (ref 136–145)

## 2012-12-24 LAB — MRSA PCR SCREENING

## 2012-12-24 LAB — PROTIME-INR: INR: 1

## 2012-12-25 LAB — URINE CULTURE

## 2012-12-31 HISTORY — PX: TOTAL KNEE ARTHROPLASTY: SHX125

## 2013-01-14 ENCOUNTER — Inpatient Hospital Stay: Payer: Self-pay | Admitting: General Practice

## 2013-01-15 LAB — BASIC METABOLIC PANEL
BUN: 15 mg/dL (ref 7–18)
Chloride: 103 mmol/L (ref 98–107)
Co2: 27 mmol/L (ref 21–32)
Creatinine: 0.74 mg/dL (ref 0.60–1.30)
EGFR (African American): >60
EGFR (Non-African Amer.): >60
Osmolality: 274 (ref 275–301)
Potassium: 3.4 mmol/L — ABNORMAL LOW (ref 3.5–5.1)

## 2013-01-16 LAB — PLATELET COUNT: Platelet: 110 10*3/uL — ABNORMAL LOW (ref 150–440)

## 2013-01-16 LAB — BASIC METABOLIC PANEL
BUN: 10 mg/dL (ref 7–18)
Chloride: 105 mmol/L (ref 98–107)
Glucose: 126 mg/dL — ABNORMAL HIGH (ref 65–99)
Osmolality: 273 (ref 275–301)
Sodium: 136 mmol/L (ref 136–145)

## 2013-02-07 ENCOUNTER — Other Ambulatory Visit: Payer: Self-pay | Admitting: Family Medicine

## 2013-03-07 ENCOUNTER — Other Ambulatory Visit: Payer: Self-pay | Admitting: Family Medicine

## 2013-03-08 NOTE — Telephone Encounter (Signed)
Last office visit 11/13/2012 with Dr. Sharen Hones.  Last lipid 12/05/2011.  Ok to refill?

## 2013-03-09 NOTE — Telephone Encounter (Signed)
30, 3 refills  F/u CPX in the next few months

## 2013-04-13 ENCOUNTER — Other Ambulatory Visit: Payer: Self-pay | Admitting: Family Medicine

## 2013-04-13 DIAGNOSIS — Z79899 Other long term (current) drug therapy: Secondary | ICD-10-CM

## 2013-04-13 DIAGNOSIS — E785 Hyperlipidemia, unspecified: Secondary | ICD-10-CM

## 2013-04-13 DIAGNOSIS — Z125 Encounter for screening for malignant neoplasm of prostate: Secondary | ICD-10-CM

## 2013-04-20 ENCOUNTER — Other Ambulatory Visit: Payer: Self-pay | Admitting: *Deleted

## 2013-04-20 ENCOUNTER — Other Ambulatory Visit (INDEPENDENT_AMBULATORY_CARE_PROVIDER_SITE_OTHER): Payer: Medicare HMO

## 2013-04-20 DIAGNOSIS — Z79899 Other long term (current) drug therapy: Secondary | ICD-10-CM

## 2013-04-20 DIAGNOSIS — E785 Hyperlipidemia, unspecified: Secondary | ICD-10-CM

## 2013-04-20 DIAGNOSIS — Z125 Encounter for screening for malignant neoplasm of prostate: Secondary | ICD-10-CM

## 2013-04-20 DIAGNOSIS — I10 Essential (primary) hypertension: Secondary | ICD-10-CM

## 2013-04-20 LAB — CBC WITH DIFFERENTIAL/PLATELET
BASOS ABS: 0 10*3/uL (ref 0.0–0.1)
Basophils Relative: 0.3 % (ref 0.0–3.0)
EOS PCT: 5.7 % — AB (ref 0.0–5.0)
Eosinophils Absolute: 0.3 10*3/uL (ref 0.0–0.7)
HCT: 42.3 % (ref 39.0–52.0)
Hemoglobin: 14.1 g/dL (ref 13.0–17.0)
Lymphocytes Relative: 28.5 % (ref 12.0–46.0)
Lymphs Abs: 1.7 10*3/uL (ref 0.7–4.0)
MCHC: 33.3 g/dL (ref 30.0–36.0)
MCV: 85.8 fl (ref 78.0–100.0)
MONO ABS: 0.5 10*3/uL (ref 0.1–1.0)
MONOS PCT: 8.1 % (ref 3.0–12.0)
Neutro Abs: 3.5 10*3/uL (ref 1.4–7.7)
Neutrophils Relative %: 57.4 % (ref 43.0–77.0)
PLATELETS: 159 10*3/uL (ref 150.0–400.0)
RBC: 4.94 Mil/uL (ref 4.22–5.81)
RDW: 15.1 % — AB (ref 11.5–14.6)
WBC: 6 10*3/uL (ref 4.5–10.5)

## 2013-04-20 LAB — HEPATIC FUNCTION PANEL
ALBUMIN: 4.2 g/dL (ref 3.5–5.2)
ALT: 22 U/L (ref 0–53)
AST: 16 U/L (ref 0–37)
Alkaline Phosphatase: 57 U/L (ref 39–117)
Bilirubin, Direct: 0.1 mg/dL (ref 0.0–0.3)
Total Bilirubin: 0.7 mg/dL (ref 0.3–1.2)
Total Protein: 7.2 g/dL (ref 6.0–8.3)

## 2013-04-20 LAB — LIPID PANEL
CHOLESTEROL: 175 mg/dL (ref 0–200)
HDL: 54.6 mg/dL (ref >39.00)
LDL Cholesterol: 97 mg/dL (ref 0–99)
TRIGLYCERIDES: 116 mg/dL (ref 0.0–149.0)
Total CHOL/HDL Ratio: 3
VLDL: 23.2 mg/dL (ref 0.0–40.0)

## 2013-04-20 LAB — BASIC METABOLIC PANEL
BUN: 14 mg/dL (ref 6–23)
CALCIUM: 10 mg/dL (ref 8.4–10.5)
CHLORIDE: 103 meq/L (ref 96–112)
CO2: 30 mEq/L (ref 19–32)
CREATININE: 0.8 mg/dL (ref 0.4–1.5)
GFR: 105.59 mL/min (ref >60.00)
Glucose, Bld: 115 mg/dL — ABNORMAL HIGH (ref 70–99)
Potassium: 4.7 mEq/L (ref 3.5–5.1)
Sodium: 141 mEq/L (ref 135–145)

## 2013-04-20 LAB — PSA: PSA: 0.95 ng/mL (ref 0.10–4.00)

## 2013-04-20 MED ORDER — LISINOPRIL 40 MG PO TABS: ORAL_TABLET | ORAL | Status: DC

## 2013-04-20 MED ORDER — AMLODIPINE BESYLATE 5 MG PO TABS: ORAL_TABLET | ORAL | Status: DC

## 2013-04-20 MED ORDER — ATORVASTATIN CALCIUM 40 MG PO TABS: ORAL_TABLET | ORAL | Status: DC

## 2013-04-20 MED ORDER — SERTRALINE HCL 50 MG PO TABS: ORAL_TABLET | ORAL | Status: DC

## 2013-04-27 ENCOUNTER — Ambulatory Visit (INDEPENDENT_AMBULATORY_CARE_PROVIDER_SITE_OTHER): Payer: Medicare HMO | Admitting: Family Medicine

## 2013-04-27 ENCOUNTER — Encounter: Payer: Self-pay | Admitting: Family Medicine

## 2013-04-27 VITALS — BP 110/60 | HR 67 | Temp 98.1°F | Ht 69.6 in | Wt 234.5 lb

## 2013-04-27 DIAGNOSIS — Z0000 Encounter for general adult medical examination without abnormal findings: Secondary | ICD-10-CM

## 2013-04-27 NOTE — Progress Notes (Signed)
Date:  04/27/2013   Name:  Alan [name]   DOB:  [birth year]   MRN:  [id number] Gender: male Age: 68 y.o.  Primary Physician:  Owens Loffler, MD   Chief Complaint: Medicare Wellness   Subjective:   History of Present Illness:  Alan [name] is a 68 y.o. pleasant patient who presents with the following:  Preventative Health Maintenance Visit: Doing very well. Recent 2nd TKA by Dr. Marry Guan  Health Maintenance Summary Reviewed and updated, unless pt declines services.  Tobacco History Reviewed. Alcohol: No concerns, no excessive use Exercise Habits: Some activity, rec at least 30 mins 5 times a week STD concerns: no risk or activity to increase risk Drug Use: None Encouraged self-testicular check  Health Maintenance  Topic Date Due  . Influenza Vaccine  10/31/2013  . Tetanus/tdap  06/12/2016  . Colonoscopy  08/05/2017  . Pneumococcal Polysaccharide Vaccine Age 48 And Over  Completed  . Zostavax  Completed    Immunization History  Administered Date(s) Administered  . Influenza Split 03/02/2011, 02/28/2013  . Influenza Whole 01/24/2007, 01/06/2008, 02/02/2009  . Pneumococcal Polysaccharide-23 12/05/2011  . Td 06/13/2006  . Zoster 09/03/2006    Patient Active Problem List   Diagnosis Date Noted  . BACK PAIN 09/20/2009  . VENTRAL HERNIA 10/27/2008  . OSTEOARTHRITIS, KNEE, RIGHT 07/23/2008  . COPD 10/02/2007  . ALLERGIC RHINITIS 07/21/2007  . LIPOMA, BACK 12/24/2006  . HYPERLIPIDEMIA 09/30/2006  . CARPAL TUNNEL SYNDROME, BILATERAL 09/30/2006  . HYPERTENSION 09/30/2006  . GERD 09/30/2006  . SKIN CANCER, HX OF 09/30/2006  . COLONIC POLYPS, HX OF 09/30/2006    Past Medical History  Diagnosis Date  . Osteoarthrosis, unspecified whether generalized or localized, lower leg   . Chronic airway obstruction, not elsewhere classified   . Allergic rhinitis, cause unspecified   . Lipoma of unspecified site   . Carpal tunnel syndrome   . Unspecified essential  hypertension   . Other and unspecified hyperlipidemia   . Esophageal reflux   . Personal history of colonic polyps   . Personal history of other malignant neoplasm of skin   . Glaucoma (increased eye pressure)     Past Surgical History  Procedure Laterality Date  . Total knee arthroplasty      03-2009 [facility]  . Bone spur  06-2003    right thumb  . Shoulder surgery  03-24-2007    removal bone spur  . Knee arthroscopy  09-2007    partial medial mesicecotmy, patellar chonfroplasty    History   Social History  . Marital Status: Married    Spouse Name: N/A    Number of Children: 2  . Years of Education: N/A   Occupational History  . [employer]    Social History Main Topics  . Smoking status: Former Smoker -- 3.00 packs/day for 75 years    Quit date: 11/23/1996  . Smokeless tobacco: Never Used  . Alcohol Use: Yes     Comment: 4 mixed drinks per week  . Drug Use: No  . Sexual Activity: Not on file   Other Topics Concern  . Not on file   Social History Narrative   Regular exercise -- no             Family History  Problem Relation Age of Onset  . Diabetes Mother   . Hyperlipidemia Mother   . Hypertension Mother   . Heart attack Mother   . Obesity Mother   . Hyperlipidemia Father   .  Hypertension Father   . Lung cancer Father   . Hypertension Sister   . Hyperlipidemia Sister   . Hyperlipidemia Brother   . Hypertension Brother   . Hyperlipidemia Brother   . Hypertension Brother   . Lung cancer Sister   . Hyperlipidemia Sister   . Colon polyps Father     No Known Allergies  Medication list has been reviewed and updated.  Review of Systems:  General: Denies fever, chills, sweats. No significant weight loss. Eyes: Denies blurring,significant itching ENT: Denies earache, sore throat, and hoarseness. Cardiovascular: Denies chest pains, palpitations, dyspnea on exertion Respiratory: Denies cough, dyspnea at rest,wheeezing Breast: no  concerns about lumps GI: Denies nausea, vomiting, diarrhea, constipation, change in bowel habits, abdominal pain, melena, hematochezia GU: Denies penile discharge, ED, urinary flow / outflow problems. No STD concerns. Musculoskeletal: Denies back pain, joint pain Derm: Denies rash, itching Neuro: Denies  paresthesias, frequent falls, frequent headaches Psych: Denies depression, anxiety Endocrine: Denies cold intolerance, heat intolerance, polydipsia Heme: Denies enlarged lymph nodes Allergy: No hayfever  Objective:   Physical Examination: BP 110/60  Pulse 67  Temp(Src) 98.1 F (36.7 C) (Oral)  Ht 5' 9.6" (1.768 m)  Wt 234 lb 8 oz (106.369 kg)  BMI 34.03 kg/m2  SpO2 97% Ideal Body Weight: Weight in (lb) to have BMI = 25: 171.9  GEN: well developed, well nourished, no acute distress Eyes: conjunctiva and lids normal, PERRLA, EOMI ENT: TM clear, nares clear, oral exam WNL Neck: supple, no lymphadenopathy, no thyromegaly, no JVD Pulm: clear to auscultation and percussion, respiratory effort normal CV: regular rate and rhythm, S1-S2, no murmur, rub or gallop, no bruits, peripheral pulses normal and symmetric, no cyanosis, clubbing, edema or varicosities GI: soft, non-tender; no hepatosplenomegaly, masses; active bowel sounds all quadrants GU: no hernia, testicular mass, penile discharge Lymph: no cervical, axillary or inguinal adenopathy MSK: gait normal, muscle tone and strength WNL, no joint swelling, effusions, discoloration, crepitus  SKIN: clear, good turgor, color WNL, no rashes, lesions, or ulcerations Neuro: normal mental status, normal strength, sensation, and motion Psych: alert; oriented to person, place and time, normally interactive and not anxious or depressed in appearance.  All labs reviewed with patient.  Lipids:    Component Value Date/Time   CHOL 175 04/20/2013 1003   TRIG 116.0 04/20/2013 1003   HDL 54.60 04/20/2013 1003   LDLDIRECT 121.2 12/05/2011 0918    VLDL 23.2 04/20/2013 1003   CHOLHDL 3 04/20/2013 1003    CBC:    Component Value Date/Time   WBC 6.0 04/20/2013 1003   HGB 14.1 04/20/2013 1003   HCT 42.3 04/20/2013 1003   PLT 159.0 04/20/2013 1003   MCV 85.8 04/20/2013 1003   NEUTROABS 3.5 04/20/2013 1003   LYMPHSABS 1.7 04/20/2013 1003   MONOABS 0.5 04/20/2013 1003   EOSABS 0.3 04/20/2013 1003   BASOSABS 0.0 04/20/2013 123456    Basic Metabolic Panel:    Component Value Date/Time   NA 141 04/20/2013 1003   K 4.7 04/20/2013 1003   CL 103 04/20/2013 1003   CO2 30 04/20/2013 1003   BUN 14 04/20/2013 1003   CREATININE 0.8 04/20/2013 1003   GLUCOSE 115* 04/20/2013 1003   CALCIUM 10.0 04/20/2013 1003    Lab Results  Component Value Date   ALT 22 04/20/2013   AST 16 04/20/2013   ALKPHOS 57 04/20/2013   BILITOT 0.7 04/20/2013    Lab Results  Component Value Date   TSH 1.18 10/25/2009    Lab  Results  Component Value Date   PSA 0.95 04/20/2013   PSA 0.89 12/05/2011   PSA 0.76 11/28/2010    Assessment & Plan:   Health Maintenance Exam: The patient's preventative maintenance and recommended screening tests for an annual wellness exam were reviewed in full today. Brought up to date unless services declined.  Counselled on the importance of diet, exercise, and its role in overall health and mortality. The patient's FH and SH was reviewed, including their home life, tobacco status, and drug and alcohol status.  I have personally reviewed the Medicare Annual Wellness questionnaire and have noted 1. The patient's medical and social history 2. Their use of alcohol, tobacco or illicit drugs 3. Their current medications and supplements 4. The patient's functional ability including ADL's, fall risks, home safety risks and hearing or visual             impairment. 5. Diet and physical activities 6. Evidence for depression or mood disorders  The patients weight, height, BMI and visual acuity have been recorded in the chart I have made referrals,  counseling and provided education to the patient based review of the above and I have provided the pt with a written personalized care plan for preventive services.  I have provided the patient with a copy of your personalized plan for preventive services. Instructed to take the time to review along with their updated medication list.

## 2013-04-27 NOTE — Progress Notes (Signed)
Pre-visit discussion using our clinic review tool. No additional management support is needed unless otherwise documented below in the visit note.  

## 2013-04-27 NOTE — Patient Instructions (Signed)
Zoloft: cut down to 1/2 a tablet and then 1/2 a tablet for 2 weeks and then stop.

## 2013-08-03 ENCOUNTER — Other Ambulatory Visit: Payer: Self-pay

## 2013-08-03 MED ORDER — AMLODIPINE BESYLATE 5 MG PO TABS: ORAL_TABLET | ORAL | Status: DC

## 2013-08-03 MED ORDER — SERTRALINE HCL 50 MG PO TABS: ORAL_TABLET | ORAL | Status: DC

## 2013-08-03 MED ORDER — ATORVASTATIN CALCIUM 40 MG PO TABS: ORAL_TABLET | ORAL | Status: DC

## 2013-08-03 MED ORDER — LISINOPRIL 40 MG PO TABS: ORAL_TABLET | ORAL | Status: DC

## 2013-08-03 NOTE — Telephone Encounter (Signed)
Pt left v/m requesting refill for lisinopril,amlodipine,atorvastatin and sertraline; advised pt done; pt will ck with [facility] and if needs meds called locally until can get mail order med pt will cb.

## 2013-09-22 ENCOUNTER — Telehealth: Payer: Self-pay | Admitting: Family Medicine

## 2013-09-22 NOTE — Telephone Encounter (Signed)
Dr. Clydell Hakim office called and recommend that pt go to [facility] walk-in clinic.  Called pt to inform at [phone], he states he talked with Call a Nurse and will come in 09/23/13 at [facility] / lt

## 2013-09-22 NOTE — Telephone Encounter (Signed)
Pt fell 2:00 pm today 09/22/13, has a titanium Left Knee.  Knee is swelling with "fluid build up".  Called Dr. Clydell Hakim office at [place] [facility] to check for appointment availability, they will call us back if able to see pt today.  Surgery completed 12/2012 by Dr. Marry Guan.  Forwarded call to [facility].  Best number to call pt is [phone].

## 2013-09-22 NOTE — Telephone Encounter (Signed)
Patient Information:  Caller Name: Nain  Phone: [phone]  Patient: Alan [name], Alan [name]  Gender: Male  DOB: 09-Feb-1946  Age: 68 Years  PCP: Owens Loffler [facility])  Office Follow Up:  Does the office need to follow up with this patient?: No  Instructions For The Office: N/A  RN Note:  Patient calling after falling on concrete injuring left knee.  States "I was trying to help out a friend, and climbed up a ladder into the attic in my storage building.  After getting down it was so hot I got dizzy and fell onto a lawnmower."  Denies bleeding, swelling, or pain.  Pior history of total knee replacement in Oct 2014.  Advised NOT to  go outside or into any building  that are not air conditioned, during heat of day.  Symptoms  Reason For Call & Symptoms: left knee injury  Reviewed Health History In EMR: Yes  Reviewed Medications In EMR: Yes  Reviewed Allergies In EMR: Yes  Reviewed Surgeries / Procedures: Yes  Date of Onset of Symptoms: 09/22/2013  Guideline(s) Used:  Knee Injury  Disposition Per Guideline:   See Today or Tomorrow in Office  Reason For Disposition Reached:   High-risk adult (e.g., age > 50, osteoporosis, chronic steroid use)  Advice Given:  Apply a Cold Pack:  Apply a cold pack or an ice bag (wrapped in a moist towel) to the area for 20 minutes. Repeat in 1 hour, then every 4 hours while awake.  Continue this for the first 48 hours after an injury.  Apply a Cold Pack:  Apply a cold pack or an ice bag (wrapped in a moist towel) to the area for 20 minutes. Repeat in 1 hour, then every 4 hours while awake.  Continue this for the first 48 hours after an injury.  This will help decrease pain and swelling.  Elevate the Leg:  Lay down and put your leg on a pillow. This puts (elevates) the knee above the heart.  Do this for 15-20 minutes, 2-3 times a day, for the first two days.  This can also help decrease swelling, bruising, and pain.  Rest vs.  Movement:  Movement is generally more healing in the long term than rest.  Continue normal activities (like walking) as much as your pain permits.  Patient Will Follow Care Advice:  YES  Appointment Scheduled:  09/23/2013 10:15:00 Appointment Scheduled Provider:  Webb Silversmith

## 2013-09-23 ENCOUNTER — Ambulatory Visit (INDEPENDENT_AMBULATORY_CARE_PROVIDER_SITE_OTHER): Payer: Medicare HMO | Admitting: Internal Medicine

## 2013-09-23 ENCOUNTER — Encounter: Payer: Self-pay | Admitting: Internal Medicine

## 2013-09-23 ENCOUNTER — Ambulatory Visit (INDEPENDENT_AMBULATORY_CARE_PROVIDER_SITE_OTHER)
Admission: RE | Admit: 2013-09-23 | Discharge: 2013-09-23 | Disposition: A | Discharge status: Home / Self Care | Payer: Medicare HMO | Source: Ambulatory Visit | Attending: Internal Medicine | Admitting: Internal Medicine

## 2013-09-23 VITALS — BP 124/70 | HR 93 | Temp 98.3°F | Wt 245.0 lb

## 2013-09-23 DIAGNOSIS — M25469 Effusion, unspecified knee: Secondary | ICD-10-CM

## 2013-09-23 DIAGNOSIS — W19XXXA Unspecified fall, initial encounter: Secondary | ICD-10-CM

## 2013-09-23 DIAGNOSIS — M25462 Effusion, left knee: Secondary | ICD-10-CM

## 2013-09-23 DIAGNOSIS — M25569 Pain in unspecified knee: Secondary | ICD-10-CM

## 2013-09-23 DIAGNOSIS — Y92009 Unspecified place in unspecified non-institutional (private) residence as the place of occurrence of the external cause: Secondary | ICD-10-CM

## 2013-09-23 DIAGNOSIS — M25562 Pain in left knee: Secondary | ICD-10-CM

## 2013-09-23 NOTE — Progress Notes (Signed)
Subjective:    [name], male    DOB: Sep 19, 1945, 68 y.o.   MRN: [id number]  HPI  Pt presents to the clinic today with c/o left knee pain. This started yesterday s/p a fall. He tripped while working in his workshop. He does note some swelling and fluid around the knee. He denies open wounds or decrease in ROM. He does have a history of left knee replacement. He has tried ice and ibuprofen with good relief. He reports that it is better today.  Review of Systems      Past Medical History  Diagnosis Date  . Osteoarthrosis, unspecified whether generalized or localized, lower leg   . Chronic airway obstruction, not elsewhere classified   . Allergic rhinitis, cause unspecified   . Lipoma of unspecified site   . Carpal tunnel syndrome   . Unspecified essential hypertension   . Other and unspecified hyperlipidemia   . Esophageal reflux   . Personal history of colonic polyps   . Personal history of other malignant neoplasm of skin   . Glaucoma (increased eye pressure)     Current Outpatient Prescriptions  Medication Sig Dispense Refill  . amLODipine (NORVASC) 5 MG tablet TAKE ONE (1) TABLET BY MOUTH EVERY DAY  90 tablet  1  . aspirin 81 MG tablet Take 81 mg by mouth daily.        Marland Kitchen atorvastatin (LIPITOR) 40 MG tablet TAKE 1 TABLET BY MOUTH DAILY  90 tablet  1  . fish oil-omega-3 fatty acids 1000 MG capsule Take 2 g by mouth daily.        Marland Kitchen latanoprost (XALATAN) 0.005 % ophthalmic solution As directed      . lisinopril (PRINIVIL,ZESTRIL) 40 MG tablet TAKE ONE (1) TABLET BY MOUTH EVERY DAY  90 tablet  1  . Multiple Vitamin (MULTIVITAMIN) tablet Take 1 tablet by mouth daily.        Marland Kitchen omeprazole (PRILOSEC) 20 MG capsule Take 20 mg by mouth daily.        . sertraline (ZOLOFT) 50 MG tablet TAKE 1 TABLET BY MOUTH DAILY  90 tablet  0  . Travoprost, BAK Free, (TRAVATAN) 0.004 % SOLN ophthalmic solution Place 1 drop into both eyes at bedtime.       No current  facility-administered medications for this visit.    No Known Allergies  Family History  Problem Relation Age of Onset  . Diabetes Mother   . Hyperlipidemia Mother   . Hypertension Mother   . Heart attack Mother   . Obesity Mother   . Hyperlipidemia Father   . Hypertension Father   . Lung cancer Father   . Hypertension Sister   . Hyperlipidemia Sister   . Hyperlipidemia Brother   . Hypertension Brother   . Hyperlipidemia Brother   . Hypertension Brother   . Lung cancer Sister   . Hyperlipidemia Sister   . Colon polyps Father     History   Social History  . Marital Status: Married    Spouse Name: N/A    Number of Children: 2  . Years of Education: N/A   Occupational History  . [employer]    Social History Main Topics  . Smoking status: Former Smoker -- 3.00 packs/day for 75 years    Quit date: 11/23/1996  . Smokeless tobacco: Never Used  . Alcohol Use: Yes     Comment: 4 mixed drinks per week  . Drug Use: No  . Sexual  Activity: Not on file   Other Topics Concern  . Not on file   Social History Narrative   Regular exercise -- no              Constitutional: Denies fever, malaise, fatigue, headache or abrupt weight changes.  Musculoskeletal: Pt reports left knee pain and swelling. Denies decrease in range of motion, difficulty with gait, muscle pain.  Skin: Denies redness, rashes, lesions or ulcercations.    No other specific complaints in a complete review of systems (except as listed in HPI above).  Objective:   Physical Exam  BP 124/70  Pulse 93  Temp(Src) 98.3 F (36.8 C) (Oral)  Wt 245 lb (111.131 kg)  SpO2 97% Wt Readings from Last 3 Encounters:  09/23/13 245 lb (111.131 kg)  04/27/13 234 lb 8 oz (106.369 kg)  11/13/12 247 lb (112.038 kg)    General: Appears his stated age, obese but well developed, well nourished in NAD.  Cardiovascular: Normal rate and rhythm. S1,S2 noted.  No murmur, rubs or gallops noted. No JVD  or BLE edema. No carotid bruits noted. Pulmonary/Chest: Normal effort and positive vesicular breath sounds. No respiratory distress. No wheezes, rales or ronchi noted.  Musculoskeletal: Normal flexion and extension of the left knee. Swelling noted of the peripatellar region. Strength 5/5 BLE. No difficulty with gait.    BMET    Component Value Date/Time   NA 141 04/20/2013 1003   K 4.7 04/20/2013 1003   CL 103 04/20/2013 1003   CO2 30 04/20/2013 1003   GLUCOSE 115* 04/20/2013 1003   BUN 14 04/20/2013 1003   CREATININE 0.8 04/20/2013 1003   CALCIUM 10.0 04/20/2013 1003   GFRNONAA 113.43 10/25/2009 0820   GFRAA 147 01/06/2008 1215    Lipid Panel     Component Value Date/Time   CHOL 175 04/20/2013 1003   TRIG 116.0 04/20/2013 1003   HDL 54.60 04/20/2013 1003   CHOLHDL 3 04/20/2013 1003   VLDL 23.2 04/20/2013 1003   LDLCALC 97 04/20/2013 1003    CBC    Component Value Date/Time   WBC 6.0 04/20/2013 1003   RBC 4.94 04/20/2013 1003   HGB 14.1 04/20/2013 1003   HCT 42.3 04/20/2013 1003   PLT 159.0 04/20/2013 1003   MCV 85.8 04/20/2013 1003   MCH 30.5 03/19/2012 2257   MCHC 33.3 04/20/2013 1003   RDW 15.1* 04/20/2013 1003   LYMPHSABS 1.7 04/20/2013 1003   MONOABS 0.5 04/20/2013 1003   EOSABS 0.3 04/20/2013 1003   BASOSABS 0.0 04/20/2013 1003    Hgb A1C No results found for this basename: HGBA1C         Assessment & Plan:   Left Knee Pain:  Will obtain xray left knee per Dr. Lorelei Pont request Continue RICE therapy at this time  RTC as needed

## 2013-09-23 NOTE — Progress Notes (Signed)
Pre visit review using our clinic review tool, if applicable. No additional management support is needed unless otherwise documented below in the visit note. 

## 2013-09-23 NOTE — Patient Instructions (Addendum)
RICE: Routine Care for Injuries The routine care of many injuries includes Rest, Ice, Compression, and Elevation (RICE). HOME CARE INSTRUCTIONS  Rest is needed to allow your body to heal. Routine activities can usually be resumed when comfortable. Injured tendons and bones can take up to 6 weeks to heal. Tendons are the cord-like structures that attach muscle to bone.  Ice following an injury helps keep the swelling down and reduces pain.  Put ice in a plastic bag.  Place a towel between your skin and the bag.  Leave the ice on for 15-20 minutes, 3-4 times a day, or as directed by your health care provider. Do this while awake, for the first 24 to 48 hours. After that, continue as directed by your caregiver.  Compression helps keep swelling down. It also gives support and helps with discomfort. If an elastic bandage has been applied, it should be removed and reapplied every 3 to 4 hours. It should not be applied tightly, but firmly enough to keep swelling down. Watch fingers or toes for swelling, bluish discoloration, coldness, numbness, or excessive pain. If any of these problems occur, remove the bandage and reapply loosely. Contact your caregiver if these problems continue.  Elevation helps reduce swelling and decreases pain. With extremities, such as the arms, hands, legs, and feet, the injured area should be placed near or above the level of the heart, if possible. SEEK IMMEDIATE MEDICAL CARE IF:  You have persistent pain and swelling.  You develop redness, numbness, or unexpected weakness.  Your symptoms are getting worse rather than improving after several days. These symptoms may indicate that further evaluation or further X-rays are needed. Sometimes, X-rays may not show a small broken bone (fracture) until 1 week or 10 days later. Make a follow-up appointment with your caregiver. Ask when your X-ray results will be ready. Make sure you get your X-ray results. Document Released:  07/01/2000 Document Revised: 03/24/2013 Document Reviewed: 08/18/2010 ExitCare Patient Information 2015 [employer]. This information is not intended to replace advice given to you by your health care provider. Make sure you discuss any questions you have with your health care provider.  

## 2013-09-28 ENCOUNTER — Encounter: Payer: Self-pay | Admitting: Family Medicine

## 2013-09-28 ENCOUNTER — Ambulatory Visit (INDEPENDENT_AMBULATORY_CARE_PROVIDER_SITE_OTHER): Payer: Medicare HMO | Admitting: Family Medicine

## 2013-09-28 VITALS — BP 120/78 | HR 68 | Temp 98.2°F | Ht 69.6 in | Wt 245.8 lb

## 2013-09-28 DIAGNOSIS — Z96652 Presence of left artificial knee joint: Secondary | ICD-10-CM

## 2013-09-28 DIAGNOSIS — M7042 Prepatellar bursitis, left knee: Secondary | ICD-10-CM

## 2013-09-28 DIAGNOSIS — M7040 Prepatellar bursitis, unspecified knee: Secondary | ICD-10-CM

## 2013-09-28 DIAGNOSIS — M25562 Pain in left knee: Secondary | ICD-10-CM

## 2013-09-28 DIAGNOSIS — M25569 Pain in unspecified knee: Secondary | ICD-10-CM

## 2013-09-28 DIAGNOSIS — Z96659 Presence of unspecified artificial knee joint: Secondary | ICD-10-CM

## 2013-09-28 NOTE — Progress Notes (Signed)
[place] [address] Phone: [phone] Fax: [phone]  [name] MRN: [id number], DOB: Apr 25, 1945, 68 y.o. Date of Encounter: 09/28/2013  Primary Physician:  Owens Loffler, MD   Chief Complaint: Fall and Fluid on Knee   Subjective:   History of Present Illness:  Alan [name] is a 68 y.o. very pleasant male patient who presents with the following:  L knee, s/p fall: prepatellar bursitis. I will start this case, and the patient came in at the end of last week after his injury. I was unable to evaluate the patient, and he saw our nurse practitioner. She staff this case with me at that time, and also reviewed his x-rays at that time. He is here today, and he wanted me to check his knee. He checked at Dr. Clydell Hakim office, and he would not be able to see him for least 2 weeks.  2 months ago, stepped and fell 2 months ago and then last week, also fell last week. Bruised up a lot. All the left one. He does have some bruising around the anterior aspect of his knee, and he also has some swelling and puffiness in his prepatellar bursitis. He was able to walk on but it does not hurt him a little bit.  Send Dr. Marry Guan.    Past Medical History, Surgical History, Social History, Family History, Problem List, Medications, and Allergies have been reviewed and updated if relevant.  Review of Systems:  GEN: No fevers, chills. Nontoxic. Primarily MSK c/o today. MSK: Detailed in the HPI GI: tolerating PO intake without difficulty Neuro: No numbness, parasthesias, or tingling associated. Otherwise the pertinent positives of the ROS are noted above.   Objective:   Physical Examination: BP 120/78  Pulse 68  Temp(Src) 98.2 F (36.8 C) (Oral)  Ht 5' 9.6" (1.768 m)  Wt 245 lb 12 oz (111.471 kg)  BMI 35.66 kg/m2   GEN: WDWN, NAD, Non-toxic, Alert & Oriented x 3 HEENT: Atraumatic, Normocephalic.  Ears and Nose: No external deformity. EXTR: No  clubbing/cyanosis/edema NEURO: Normal gait.  PSYCH: Normally interactive. Conversant. Not depressed or anxious appearing.  Calm demeanor.    Patient does have some bruising on the anterior aspect of the left knee and also some medially.  There is a local prepatellar bursitis that is mild in size.  Minimally effusion. Knee is stable to varus and valgus stress. He has full extension and flexion to 125. Hip range of motion is full and is nontender. Grossly nontender throughout the distal femur, and proximal tibia and fibula.  Radiology: Dg Knee Complete 4 Views Left  09/23/2013   CLINICAL DATA:  Fall.  History of total knee replacement.  EXAM: LEFT KNEE - COMPLETE 4+ VIEW  COMPARISON:  09/03/2012  FINDINGS: Changes of left knee replacement. Overlying anterior soft tissue swelling. No fracture, subluxation or dislocation. No hardware complicating feature. No visible significant joint effusion.  IMPRESSION: Anterior soft tissue swelling. Changes of left knee replacement. No acute bony abnormality.   Electronically Signed   By: Rolm Baptise M.D.   On: 09/23/2013 13:37   The radiological images were independently reviewed by myself in the office and results were reviewed with the patient. My independent interpretation of images:  The knee replacement hardware appears to be in appropriate position, and there is no radiolucency adjacent to the hardware. There is no evidence for occult fracture. There does appear to be some soft tissue swelling, which would correspond to the patient's  prepatellar bursitis clinically. Electronically Signed  By: Owens Loffler, MD On: 09/28/2013 5:14 PM    Assessment & Plan:   Knee pain, acute, left  Prepatellar bursitis, left  History of total knee arthroplasty, left  The swelling appears to be primarily from his prepatellar bursitis which I think is from trauma. I recommended that the patient use compression over the next 4-5 days. Suggested a neoprene sleeve which  she has.  He also can continue to take some Tylenol, and I would add some anti-inflammatories.  History joint replacement on the left knee was done in October of 2014. I will send a carbon copy of this note to Dr. Marry Guan, also.  Patient Instructions  Compression (neoprene sleeve) - don't sleep, but use it most of the time when awake.   Ice for the next few days, 20 mins at a time.   Alleve 2 tabs by mouth two times a day over the counter: Take at least for 2 - 3 weeks. This is equal to a prescripton strength dose (GENERIC CHEAPER EQUIVALENT IS NAPROXEN SODIUM)   OR   Motrin 600 - 800 mg recommended TID. (Over the counter Motrin, Advil, or Generic Ibuprofen 200 mg tablets. 3-4 tablets by mouth 3 times a day. This equals a prescription strength dose.)       Signed,  Refugio Vandevoorde T. Laquitha Heslin, MD, [facility] Sports Medicine   Discontinued Medications   No medications on file   Current Medications at Discharge:   Medication List       This list is accurate as of: 09/28/13  67:14 PM.  Always use your most recent med list.               amLODipine 5 MG tablet  Commonly known as:  NORVASC  TAKE ONE (1) TABLET BY MOUTH EVERY DAY     aspirin 81 MG tablet  Take 81 mg by mouth daily.     atorvastatin 40 MG tablet  Commonly known as:  LIPITOR  TAKE 1 TABLET BY MOUTH DAILY     fish oil-omega-3 fatty acids 1000 MG capsule  Take 2 g by mouth daily.     latanoprost 0.005 % ophthalmic solution  Commonly known as:  XALATAN  As directed     lisinopril 40 MG tablet  Commonly known as:  PRINIVIL,ZESTRIL  TAKE ONE (1) TABLET BY MOUTH EVERY DAY     multivitamin tablet  Take 1 tablet by mouth daily.     omeprazole 20 MG capsule  Commonly known as:  PRILOSEC  Take 20 mg by mouth daily.     sertraline 50 MG tablet  Commonly known as:  ZOLOFT  TAKE 1 TABLET BY MOUTH DAILY     Travoprost (BAK Free) 0.004 % Soln ophthalmic solution  Commonly known as:  TRAVATAN  Place 1 drop into both  eyes at bedtime.

## 2013-09-28 NOTE — Progress Notes (Signed)
Pre visit review using our clinic review tool, if applicable. No additional management support is needed unless otherwise documented below in the visit note. 

## 2013-09-28 NOTE — Patient Instructions (Signed)
Compression (neoprene sleeve) - don't sleep, but use it most of the time when awake.   Ice for the next few days, 20 mins at a time.   Alleve 2 tabs by mouth two times a day over the counter: Take at least for 2 - 3 weeks. This is equal to a prescripton strength dose (GENERIC CHEAPER EQUIVALENT IS NAPROXEN SODIUM)   OR   Motrin 600 - 800 mg recommended TID. (Over the counter Motrin, Advil, or Generic Ibuprofen 200 mg tablets. 3-4 tablets by mouth 3 times a day. This equals a prescription strength dose.)

## 2013-10-30 ENCOUNTER — Other Ambulatory Visit: Payer: Self-pay | Admitting: *Deleted

## 2013-10-30 MED ORDER — SERTRALINE HCL 50 MG PO TABS: ORAL_TABLET | ORAL | Status: DC

## 2013-10-30 NOTE — Telephone Encounter (Signed)
Received faxed refill request from pharmacy. Refill sent to pharmacy electronically. 

## 2013-11-16 ENCOUNTER — Encounter: Payer: Self-pay | Admitting: Family Medicine

## 2013-11-16 ENCOUNTER — Ambulatory Visit (INDEPENDENT_AMBULATORY_CARE_PROVIDER_SITE_OTHER): Payer: Medicare HMO | Admitting: Family Medicine

## 2013-11-16 ENCOUNTER — Telehealth: Payer: Self-pay | Admitting: Family Medicine

## 2013-11-16 VITALS — BP 122/72 | HR 74 | Temp 98.6°F | Ht 69.6 in | Wt 247.2 lb

## 2013-11-16 DIAGNOSIS — K649 Unspecified hemorrhoids: Secondary | ICD-10-CM

## 2013-11-16 DIAGNOSIS — K648 Other hemorrhoids: Secondary | ICD-10-CM

## 2013-11-16 MED ORDER — HYDROCORTISONE ACETATE 25 MG RE SUPP: 25.0000 mg | Freq: Two times a day (BID) | RECTAL | Status: DC | PRN

## 2013-11-16 NOTE — Telephone Encounter (Signed)
Patient notified as instructed by telephone. 

## 2013-11-16 NOTE — Telephone Encounter (Signed)
Spoke to [employer] Warehouse manager) at [facility] and was advised that there is nothing cheaper on the market and insurance does not cover this medication. Rob stated that Hydrocortisone cream is cheaper and can be used for external hemorrhoids. Rob stated the only alternative that would be cheaper is the OTC Preparation H suppositories and they will work, but not as well.

## 2013-11-16 NOTE — Telephone Encounter (Signed)
[place], let Alan [name] know the sitz bath recipes.

## 2013-11-16 NOTE — Telephone Encounter (Signed)
Change to   proctosol-HC 2.5% cream, apply after BM up to 4 times a day or at least BID  Sitz baths OTC, TID for 10-15 minutes.

## 2013-11-16 NOTE — Telephone Encounter (Signed)
Patient was seen this morning.  Patient went to the pharmacy to pick up his prescription and it was $400 with insurance.  Patient wants to know if you can call in a prescription for something cheaper.  Patient goes to [facility].

## 2013-11-16 NOTE — Progress Notes (Signed)
   11/16/2013    ID: NAKAI POLLIO   MRN: [id number]  DOB: 1945-06-03  Primary Physician:  Owens Loffler, MD  Chief Complaint: Hemorrhoids  Subjective:   History of Present Illness:  Alan [name] is a 68 y.o. very pleasant male patient who presents with the following:  Had some diarrhea, cream and Prep-h. Very rarely. Hemorrhoids  Flared - mostly on the left  Past Medical History, Surgical History, Social History, Family History, Problem List, Medications, and Allergies have been reviewed and updated if relevant.  Outpatient Encounter Prescriptions as of 11/16/2013  Medication Sig  . amLODipine (NORVASC) 5 MG tablet TAKE ONE (1) TABLET BY MOUTH EVERY DAY  . aspirin 81 MG tablet Take 81 mg by mouth daily.    Marland Kitchen atorvastatin (LIPITOR) 40 MG tablet TAKE 1 TABLET BY MOUTH DAILY  . fish oil-omega-3 fatty acids 1000 MG capsule Take 2 g by mouth daily.    Marland Kitchen latanoprost (XALATAN) 0.005 % ophthalmic solution As directed  . lisinopril (PRINIVIL,ZESTRIL) 40 MG tablet TAKE ONE (1) TABLET BY MOUTH EVERY DAY  . Multiple Vitamin (MULTIVITAMIN) tablet Take 1 tablet by mouth daily.    Marland Kitchen omeprazole (PRILOSEC) 20 MG capsule Take 20 mg by mouth daily.    . sertraline (ZOLOFT) 50 MG tablet TAKE 1 TABLET BY MOUTH DAILY  . Travoprost, BAK Free, (TRAVATAN) 0.004 % SOLN ophthalmic solution Place 1 drop into both eyes at bedtime.   Review of Systems:  GEN: No acute illnesses, no fevers, chills. GI: No n/v/d, eating normally Pulm: No SOB Interactive and getting along well at home.  Otherwise, ROS is as per the HPI.  Objective:   Physical Examination: BP 122/72  Pulse 74  Temp(Src) 98.6 F (37 C) (Oral)  Ht 5' 9.6" (1.768 m)  Wt 247 lb 4 oz (112.152 kg)  BMI 35.88 kg/m2   GEN: WDWN, NAD, Non-toxic, A & O x 3 HEENT: Atraumatic, Normocephalic. Neck supple. No masses, No LAD. Ears and Nose: No external deformity. CV: RRR, No M/G/R. No JVD. No thrill. No extra heart sounds. PULM: CTA  B, no wheezes, crackles, rhonchi. No retractions. No resp. distress. No accessory muscle use. EXTR: No c/c/e NEURO Normal gait.  PSYCH: Normally interactive. Conversant. Not depressed or anxious appearing.  Calm demeanor.  GU no obvious thrombosis. Tender more on the L  Laboratory and Imaging Data:  Assessment & Plan:   Other hemorrhoids  Add anusol BID  Follow-up: No Follow-up on file.  New Prescriptions   HYDROCORTISONE (ANUSOL-HC) 25 MG SUPPOSITORY    Place 1 suppository (25 mg total) rectally 2 (two) times daily as needed for hemorrhoids or itching.   No orders of the defined types were placed in this encounter.    Signed,  Maud Deed. Hayato Guaman, MD, [facility] [place], [address] Phone: [phone] Fax: [phone]   Discontinued Medications   No medications on file   Modified Medications   No medications on file

## 2013-11-16 NOTE — Progress Notes (Signed)
Pre visit review using our clinic review tool, if applicable. No additional management support is needed unless otherwise documented below in the visit note. 

## 2013-11-16 NOTE — Telephone Encounter (Signed)
HYDROcortisone suppositories, 1 po bid, #24, 2 refills  Generic is ok, anusol or close equivalent is ok.

## 2013-11-16 NOTE — Telephone Encounter (Signed)
Script called to pharmacy for [facility]. Spoke to Amy Warehouse manager) and was advised that they no longer carry the OTC sitz baths and she checked and they are currently on back order. Amy stated that the there are two home made recipes that are suggested, patient can use 1/2 cup of salt to hot water or 1/2 cup of witch hazel. They do sale the sitz tub.

## 2014-01-18 ENCOUNTER — Other Ambulatory Visit: Payer: Self-pay | Admitting: *Deleted

## 2014-01-18 MED ORDER — SERTRALINE HCL 50 MG PO TABS: ORAL_TABLET | ORAL | Status: DC

## 2014-01-18 MED ORDER — LISINOPRIL 40 MG PO TABS: ORAL_TABLET | ORAL | Status: DC

## 2014-01-18 MED ORDER — AMLODIPINE BESYLATE 5 MG PO TABS: ORAL_TABLET | ORAL | Status: DC

## 2014-01-18 MED ORDER — ATORVASTATIN CALCIUM 40 MG PO TABS: ORAL_TABLET | ORAL | Status: DC

## 2014-01-20 ENCOUNTER — Other Ambulatory Visit: Payer: Self-pay | Admitting: *Deleted

## 2014-01-25 ENCOUNTER — Telehealth: Payer: Self-pay | Admitting: Family Medicine

## 2014-01-25 ENCOUNTER — Other Ambulatory Visit: Payer: Self-pay | Admitting: Family Medicine

## 2014-01-25 DIAGNOSIS — K648 Other hemorrhoids: Secondary | ICD-10-CM

## 2014-01-25 NOTE — Telephone Encounter (Signed)
Pt called still having problems with hemorrhoids and would like to see a specialist. Please advise!

## 2014-01-25 NOTE — Telephone Encounter (Signed)
done

## 2014-03-24 ENCOUNTER — Telehealth: Payer: Self-pay | Admitting: Family Medicine

## 2014-03-24 NOTE — Telephone Encounter (Signed)
Patient scheduled labs on 04/28/14 and physical on 05/03/14. Please close.

## 2014-03-24 NOTE — Telephone Encounter (Signed)
Please schedule CPE with fasting labs prior with Dr. Lorelei Pont for sometime end of January/February.

## 2014-03-31 ENCOUNTER — Telehealth: Payer: Self-pay | Admitting: Family Medicine

## 2014-03-31 NOTE — Telephone Encounter (Signed)
Chart updated

## 2014-03-31 NOTE — Telephone Encounter (Signed)
LMOM to call and schedule flu shot appt.

## 2014-03-31 NOTE — Telephone Encounter (Signed)
Pt received automated call reminding him to get his flu shot. Pt received flu shot at [facility] last week. Pt states they are supposed to fax over documentation.

## 2014-04-27 ENCOUNTER — Other Ambulatory Visit: Payer: Self-pay | Admitting: Family Medicine

## 2014-04-27 DIAGNOSIS — Z125 Encounter for screening for malignant neoplasm of prostate: Secondary | ICD-10-CM

## 2014-04-27 DIAGNOSIS — Z79899 Other long term (current) drug therapy: Secondary | ICD-10-CM

## 2014-04-27 DIAGNOSIS — E785 Hyperlipidemia, unspecified: Secondary | ICD-10-CM

## 2014-04-28 ENCOUNTER — Other Ambulatory Visit (INDEPENDENT_AMBULATORY_CARE_PROVIDER_SITE_OTHER): Payer: Commercial Managed Care - HMO

## 2014-04-28 DIAGNOSIS — E785 Hyperlipidemia, unspecified: Secondary | ICD-10-CM | POA: Diagnosis not present

## 2014-04-28 DIAGNOSIS — Z79899 Other long term (current) drug therapy: Secondary | ICD-10-CM

## 2014-04-28 DIAGNOSIS — Z125 Encounter for screening for malignant neoplasm of prostate: Secondary | ICD-10-CM

## 2014-04-28 LAB — LIPID PANEL
CHOL/HDL RATIO: 3
Cholesterol: 228 mg/dL — ABNORMAL HIGH (ref 0–200)
HDL: 77 mg/dL (ref >39.00)
LDL CALC: 117 mg/dL — AB (ref 0–99)
NONHDL: 151
Triglycerides: 169 mg/dL — ABNORMAL HIGH (ref 0.0–149.0)
VLDL: 33.8 mg/dL (ref 0.0–40.0)

## 2014-04-28 LAB — CBC WITH DIFFERENTIAL/PLATELET
BASOS ABS: 0 10*3/uL (ref 0.0–0.1)
BASOS PCT: 0.6 % (ref 0.0–3.0)
EOS ABS: 0.3 10*3/uL (ref 0.0–0.7)
Eosinophils Relative: 5.5 % — ABNORMAL HIGH (ref 0.0–5.0)
HCT: 35.2 % — ABNORMAL LOW (ref 39.0–52.0)
Hemoglobin: 12.1 g/dL — ABNORMAL LOW (ref 13.0–17.0)
LYMPHS ABS: 2 10*3/uL (ref 0.7–4.0)
Lymphocytes Relative: 33.1 % (ref 12.0–46.0)
MCHC: 34.4 g/dL (ref 30.0–36.0)
MCV: 88.8 fl (ref 78.0–100.0)
Monocytes Absolute: 0.5 10*3/uL (ref 0.1–1.0)
Monocytes Relative: 8.8 % (ref 3.0–12.0)
NEUTROS ABS: 3.2 10*3/uL (ref 1.4–7.7)
Neutrophils Relative %: 52 % (ref 43.0–77.0)
PLATELETS: 180 10*3/uL (ref 150.0–400.0)
RBC: 3.96 Mil/uL — ABNORMAL LOW (ref 4.22–5.81)
RDW: 13.2 % (ref 11.5–15.5)
WBC: 6.2 10*3/uL (ref 4.0–10.5)

## 2014-04-28 LAB — HEPATIC FUNCTION PANEL
ALBUMIN: 4.2 g/dL (ref 3.5–5.2)
ALK PHOS: 52 U/L (ref 39–117)
ALT: 28 U/L (ref 0–53)
AST: 19 U/L (ref 0–37)
BILIRUBIN TOTAL: 0.5 mg/dL (ref 0.2–1.2)
Bilirubin, Direct: 0.1 mg/dL (ref 0.0–0.3)
Total Protein: 6.9 g/dL (ref 6.0–8.3)

## 2014-04-28 LAB — BASIC METABOLIC PANEL
BUN: 26 mg/dL — ABNORMAL HIGH (ref 6–23)
CO2: 26 mEq/L (ref 19–32)
CREATININE: 0.85 mg/dL (ref 0.40–1.50)
Calcium: 9.6 mg/dL (ref 8.4–10.5)
Chloride: 106 mEq/L (ref 96–112)
GFR: 95.32 mL/min (ref >60.00)
GLUCOSE: 130 mg/dL — AB (ref 70–99)
Potassium: 4.7 mEq/L (ref 3.5–5.1)
Sodium: 139 mEq/L (ref 135–145)

## 2014-04-28 LAB — PSA: PSA: 0.57 ng/mL (ref 0.10–4.00)

## 2014-05-03 ENCOUNTER — Encounter: Payer: Self-pay | Admitting: Family Medicine

## 2014-05-03 ENCOUNTER — Ambulatory Visit (INDEPENDENT_AMBULATORY_CARE_PROVIDER_SITE_OTHER): Payer: Commercial Managed Care - HMO | Admitting: Family Medicine

## 2014-05-03 VITALS — BP 120/66 | HR 90 | Temp 98.6°F | Ht 69.75 in | Wt 253.0 lb

## 2014-05-03 DIAGNOSIS — Z0000 Encounter for general adult medical examination without abnormal findings: Secondary | ICD-10-CM

## 2014-05-03 DIAGNOSIS — Z23 Encounter for immunization: Secondary | ICD-10-CM | POA: Diagnosis not present

## 2014-05-03 NOTE — Progress Notes (Signed)
Dr. Frederico Hamman T. Copland, MD, [facility] [place], [address] Phone: [phone] Fax: [phone]  05/03/2014  Patient: Alan [name], MRN: [id number], DOB: June 26, 1945, 69 y.o.  Primary Physician:  Owens Loffler, MD  Chief Complaint: Annual Exam  Subjective:   Alan [name] is a 69 y.o. pleasant patient who presents for a medicare wellness examination:  Preventative Health Maintenance Visit:  Health Maintenance Summary Reviewed and updated, unless pt declines services.  Tobacco History Reviewed. Alcohol: No concerns, no excessive use Exercise Habits: None STD concerns: no risk or activity to increase risk Drug Use: None Encouraged self-testicular check  Basically, he is doing well, except he has gained weight.  He has become a lot more inactive over the last year.  His blood sugar is up, and his fasting blood sugar is up to 130.  He has never been diabetic in the past.  His cholesterol is also up compared to last year.  Health Maintenance  Topic Date Due  . INFLUENZA VACCINE  11/01/2014  . TETANUS/TDAP  06/12/2016  . COLONOSCOPY  08/05/2017  . PNEUMOCOCCAL POLYSACCHARIDE VACCINE AGE 68 AND OVER  Completed  . ZOSTAVAX  Completed    Immunization History  Administered Date(s) Administered  . Influenza Split 03/02/2011, 02/28/2013  . Influenza Whole 01/24/2007, 01/06/2008, 02/02/2009  . Influenza-Unspecified 03/22/2014  . Pneumococcal Conjugate-13 05/03/2014  . Pneumococcal Polysaccharide-23 12/05/2011  . Td 06/13/2006  . Zoster 09/03/2006    Patient Active Problem List   Diagnosis Date Noted  . BACK PAIN 09/20/2009  . VENTRAL HERNIA 10/27/2008  . OSTEOARTHRITIS, KNEE, RIGHT 07/23/2008  . COPD 10/02/2007  . ALLERGIC RHINITIS 07/21/2007  . LIPOMA, BACK 12/24/2006  . HYPERLIPIDEMIA 09/30/2006  . CARPAL TUNNEL SYNDROME, BILATERAL 09/30/2006  . HYPERTENSION 09/30/2006  . GERD 09/30/2006  . SKIN  CANCER, HX OF 09/30/2006  . COLONIC POLYPS, HX OF 09/30/2006   Past Medical History  Diagnosis Date  . Osteoarthrosis, unspecified whether generalized or localized, lower leg   . Chronic airway obstruction, not elsewhere classified   . Allergic rhinitis, cause unspecified   . Lipoma of unspecified site   . Carpal tunnel syndrome   . Unspecified essential hypertension   . Other and unspecified hyperlipidemia   . Esophageal reflux   . Personal history of colonic polyps   . Personal history of other malignant neoplasm of skin   . Glaucoma (increased eye pressure)    Past Surgical History  Procedure Laterality Date  . Total knee arthroplasty      03-2009 [facility]  . Bone spur  06-2003    right thumb  . Shoulder surgery  03-24-2007    removal bone spur  . Knee arthroscopy  09-2007    partial medial mesicecotmy, patellar chonfroplasty  . Total knee arthroplasty Left 12/2012    [facility]   History   Social History  . Marital Status: Married    Spouse Name: N/A    Number of Children: 2  . Years of Education: N/A   Occupational History  . [employer]    Social History Main Topics  . Smoking status: Former Smoker -- 3.00 packs/day for 75 years    Quit date: 11/23/1996  . Smokeless tobacco: Never Used  . Alcohol Use: Yes     Comment: 4 mixed drinks per week  . Drug Use: No  . Sexual Activity: Not on file   Other Topics Concern  . Not on file  Social History Narrative   Regular exercise -- no            Family History  Problem Relation Age of Onset  . Diabetes Mother   . Hyperlipidemia Mother   . Hypertension Mother   . Heart attack Mother   . Obesity Mother   . Hyperlipidemia Father   . Hypertension Father   . Lung cancer Father   . Hypertension Sister   . Hyperlipidemia Sister   . Hyperlipidemia Brother   . Hypertension Brother   . Hyperlipidemia Brother   . Hypertension Brother   . Lung cancer Sister   . Hyperlipidemia Sister   . Colon  polyps Father    No Known Allergies  Medication list has been reviewed and updated.   General: Denies fever, chills, sweats. No significant weight loss. Eyes: Denies blurring,significant itching ENT: Denies earache, sore throat, and hoarseness. Cardiovascular: Denies chest pains, palpitations, dyspnea on exertion Respiratory: Denies cough, dyspnea at rest,wheeezing Breast: no concerns about lumps GI: Denies nausea, vomiting, diarrhea, constipation, change in bowel habits, abdominal pain, melena, hematochezia GU: Denies penile discharge, ED, urinary flow / outflow problems. No STD concerns. Musculoskeletal: Denies back pain, joint pain Derm: Denies rash, itching Neuro: Denies  paresthesias, frequent falls, frequent headaches Psych: Denies depression, anxiety Endocrine: Denies cold intolerance, heat intolerance, polydipsia Heme: Denies enlarged lymph nodes Allergy: No hayfever  Objective:   BP 120/66 mmHg  Pulse 90  Temp(Src) 98.6 F (37 C) (Oral)  Ht 5' 9.75" (1.772 m)  Wt 253 lb (114.76 kg)  BMI 36.55 kg/m2  The patient completed a fall screen and PHQ-2 and PHQ-9 if necessary, which is documented in the EHR. The CMA/LPN/RN who assisted the patient verbally completed with them and documented results in [facility].   Hearing Screening   Method: Audiometry   125Hz 250Hz 500Hz 1000Hz 2000Hz 4000Hz 8000Hz  Right ear:   _0 0   Left ear:   _1 40   Vision Screening Comments: Wears Glasses-Had yearly eye exam with Dr. Peter Garter at [facility] 01/2014  GEN: well developed, well nourished, no acute distress Eyes: conjunctiva and lids normal, PERRLA, EOMI ENT: TM clear, nares clear, oral exam WNL Neck: supple, no lymphadenopathy, no thyromegaly, no JVD Pulm: clear to auscultation and percussion, respiratory effort normal CV: regular rate and rhythm, S1-S2, no murmur, rub or gallop, no bruits, peripheral pulses normal and symmetric, no cyanosis, clubbing, edema  or varicosities GI: soft, non-tender; no hepatosplenomegaly, masses; active bowel sounds all quadrants GU: no hernia, testicular mass, penile discharge Lymph: no cervical, axillary or inguinal adenopathy MSK: gait normal, muscle tone and strength WNL, no joint swelling, effusions, discoloration, crepitus  SKIN: clear, good turgor, color WNL, no rashes, lesions, or ulcerations Neuro: normal mental status, normal strength, sensation, and motion Psych: alert; oriented to person, place and time, normally interactive and not anxious or depressed in appearance.  All labs reviewed with patient.  Lipids:    Component Value Date/Time   CHOL 228* 04/28/2014 0812   TRIG 169.0* 04/28/2014 0812   HDL 77.00 04/28/2014 0812   LDLDIRECT 121.2 12/05/2011 0918   VLDL 33.8 04/28/2014 0812   CHOLHDL 3 04/28/2014 0812   CBC: CBC Latest Ref Rng 04/28/2014 04/20/2013 03/19/2012  WBC 4.0 - 10.5 K/uL 6.2 6.0 -  Hemoglobin 13.0 - 17.0 g/dL 12.1(L) 14.1 12.6(L)  Hematocrit 39.0 - 52.0 % 35.2(L) 42.3 37.0(L)  Platelets 150.0 - 400.0 K/uL 180.0 159.0 -  Basic Metabolic Panel:    Component Value Date/Time   NA 139 04/28/2014 0812   K 4.7 04/28/2014 0812   CL 106 04/28/2014 0812   CO2 26 04/28/2014 0812   BUN 26* 04/28/2014 0812   CREATININE 0.85 04/28/2014 0812   GLUCOSE 130* 04/28/2014 0812   CALCIUM 9.6 04/28/2014 0812   Hepatic Function Latest Ref Rng 04/28/2014 04/20/2013 12/05/2011  Total Protein 6.0 - 8.3 g/dL 6.9 7.2 7.3  Albumin 3.5 - 5.2 g/dL 4.2 4.2 4.3  AST 0 - 37 U/L _0 ALT 0 - 53 U/L 28 22 36  Alk Phosphatase 39 - 117 U/L 52 57 53  Total Bilirubin 0.2 - 1.2 mg/dL 0.5 0.7 0.6  Bilirubin, Direct 0.0 - 0.3 mg/dL 0.1 0.1 0.1    Lab Results  Component Value Date   TSH 1.18 10/25/2009   Lab Results  Component Value Date   PSA 0.57 04/28/2014   PSA 0.95 04/20/2013   PSA 0.89 12/05/2011    Assessment and Plan:   Healthcare maintenance  Need for prophylactic vaccination  against Streptococcus pneumoniae (pneumococcus) - Plan: Pneumococcal conjugate vaccine 13-valent IM  Health Maintenance Exam: The patient's preventative maintenance and recommended screening tests for an annual wellness exam were reviewed in full today. Brought up to date unless services declined.  Counselled on the importance of diet, exercise, and its role in overall health and mortality. The patient's FH and SH was reviewed, including their home life, tobacco status, and drug and alcohol status.  I have personally reviewed the Medicare Annual Wellness questionnaire and have noted 1. The patient's medical and social history 2. Their use of alcohol, tobacco or illicit drugs 3. Their current medications and supplements 4. The patient's functional ability including ADL's, fall risks, home safety risks and hearing or visual             impairment. 5. Diet and physical activities 6. Evidence for depression or mood disorders  The patients weight, height, BMI and visual acuity have been recorded in the chart I have made referrals, counseling and provided education to the patient based review of the above and I have provided the pt with a written personalized care plan for preventive services.  I have provided the patient with a copy of your personalized plan for preventive services. Instructed to take the time to review along with their updated medication list.  BS elevated. Check a1c. Trial of weight loss and recheck 4 mo.  Follow-up: Return in about 4 months (around 09/01/2014). Or follow-up in 1 year for complete physical examination  New Prescriptions   No medications on file   Orders Placed This Encounter  Procedures  . Pneumococcal conjugate vaccine 13-valent IM    Signed,  Spencer T. Copland, MD   Patient's Medications  New Prescriptions   No medications on file  Previous Medications   AMLODIPINE (NORVASC) 5 MG TABLET    TAKE 1 TABLET EVERY DAY   ASPIRIN 81 MG TABLET     Take 81 mg by mouth daily.     ATORVASTATIN (LIPITOR) 40 MG TABLET    TAKE 1 TABLET EVERY DAY   FISH OIL-OMEGA-3 FATTY ACIDS 1000 MG CAPSULE    Take 2 g by mouth daily.     LATANOPROST (XALATAN) 0.005 % OPHTHALMIC SOLUTION    As directed   LISINOPRIL (PRINIVIL,ZESTRIL) 40 MG TABLET    TAKE 1 TABLET EVERY DAY   MULTIPLE VITAMIN (MULTIVITAMIN) TABLET    Take 1 tablet  by mouth daily.     OMEPRAZOLE (PRILOSEC) 20 MG CAPSULE    Take 20 mg by mouth daily.     SERTRALINE (ZOLOFT) 50 MG TABLET    TAKE 1 TABLET EVERY DAY   TRAVOPROST, BAK FREE, (TRAVATAN) 0.004 % SOLN OPHTHALMIC SOLUTION    Place 1 drop into both eyes at bedtime.  Modified Medications   No medications on file  Discontinued Medications   HYDROCORTISONE (ANUSOL-HC) 25 MG SUPPOSITORY    Place 1 suppository (25 mg total) rectally 2 (two) times daily as needed for hemorrhoids or itching.

## 2014-05-03 NOTE — Progress Notes (Signed)
Pre visit review using our clinic review tool, if applicable. No additional management support is needed unless otherwise documented below in the visit note. 

## 2014-05-04 ENCOUNTER — Encounter: Payer: Self-pay | Admitting: Family Medicine

## 2014-05-04 ENCOUNTER — Other Ambulatory Visit (INDEPENDENT_AMBULATORY_CARE_PROVIDER_SITE_OTHER): Payer: Commercial Managed Care - HMO

## 2014-05-04 DIAGNOSIS — R7303 Prediabetes: Secondary | ICD-10-CM

## 2014-05-04 DIAGNOSIS — R7309 Other abnormal glucose: Secondary | ICD-10-CM

## 2014-05-04 LAB — HEMOGLOBIN A1C: Hgb A1c MFr Bld: 5.9 % (ref 4.6–6.5)

## 2014-06-03 ENCOUNTER — Telehealth: Payer: Self-pay | Admitting: Family Medicine

## 2014-06-03 NOTE — Telephone Encounter (Signed)
Patient saw Dr.Copland and was told he is borderline diabetic.  Patient would like a referral to a nutritionist.

## 2014-06-04 ENCOUNTER — Telehealth: Payer: Self-pay | Admitting: Family Medicine

## 2014-06-04 NOTE — Telephone Encounter (Signed)
Pt called in requesting a change from Dr Lorelei Pont to Allie Bossier. He is unhappy because Dr Lorelei Pont is very busy and can not see pt or return messages as quickly as he would like.  Please let me know if it is ok to schedule with Allie Bossier. Thanks

## 2014-06-05 ENCOUNTER — Other Ambulatory Visit: Payer: Self-pay | Admitting: Family Medicine

## 2014-06-05 DIAGNOSIS — R7303 Prediabetes: Secondary | ICD-10-CM

## 2014-06-05 NOTE — Telephone Encounter (Signed)
done

## 2014-06-05 NOTE — Telephone Encounter (Signed)
There are no barriers here. He speaks a true statement. My practice is busy, I am doing less general practice, and I have longer waits than some.  If Alan [name] is willing to accept him, it should be fine. He is a very nice man.  Cc: Mrs. Virgia Land

## 2014-06-07 NOTE — Telephone Encounter (Signed)
Patient had a 4 month follow up with Dr.Copland on June 1st. I switched the appointment to [facility] and I made it a 30 minute appointment. He,also, had an appointment for lab work scheduled before the appointment.

## 2014-06-07 NOTE — Telephone Encounter (Signed)
I am happy to accept him.   Thanks! Allie Bossier

## 2014-07-13 DIAGNOSIS — H40013 Open angle with borderline findings, low risk, bilateral: Secondary | ICD-10-CM | POA: Diagnosis not present

## 2014-07-13 DIAGNOSIS — H25013 Cortical age-related cataract, bilateral: Secondary | ICD-10-CM | POA: Diagnosis not present

## 2014-07-23 NOTE — Discharge Summary (Signed)
PATIENT NAME:  Alan [name], CRANSHAW MR#:  [id number] DATE OF BIRTH:  07/14/1945  DATE OF ADMISSION:  01/14/2013 DATE OF DISCHARGE:  01/17/2013  Vance Peper, PA, dictating for Dr. Skip Estimable.   ADMITTING DIAGNOSIS:  Degenerative arthrosis of the left knee.   DISCHARGE DIAGNOSIS:  Degenerative arthrosis of the left knee.   HISTORY OF PRESENT ILLNESS:  The patient is a 69 year old gentleman who has been followed in our [facility] for progression of left knee pain. He had reported a long history of left knee pain. The pain had increased in severity over the last 6 months. He had not seen any significant improvement in his condition despite intra-articular cortisone injections as well as the use of anti-inflammatory medication. He had been reporting some night pain. The patient had localized most of the pain along the medial aspect of the knee. He also had noticed some migrating pain with weightbearing activities. On occasion, the patient was noted to have some mild activity-related swelling of the knee. At the time of surgery, he was not using any ambulatory aid. The patient states that his pain had increased to the point that it was significantly interfering with his activities of daily living. X-rays taken in [place] showed narrowing of the medial cartilage space with associated varus alignment. He was noted to have osteophyte as well as subchondral sclerosis. After discussion of the risks and benefits of surgical intervention, the patient expressed his understanding of the risks and benefits and agreed for plans of surgical intervention.   HOSPITAL COURSE: PROCEDURE:  Left total knee arthroplasty using computer-assisted navigation.   ANESTHESIA:  Spinal.   SOFT TISSUE RELEASE:  Anterior cruciate ligament, posterior cruciate ligament, deep and superficial medial collateral ligaments, as well as the patellofemoral ligament.   IMPLANTS UTILIZED:  DePuy PFC Sigma size 5 posterior  stabilized femoral component cemented, size 5 MBT tibial component cemented, a 38-mm 3-pegged oval dome patella cemented, and a 10 mm stabilized rotating cuff 1 polyethylene insert.   The patient tolerated the procedure very well. He had no complications. He was then taken to the PACU where he was stabilized, then transferred to the orthopaedic floor. The patient began receiving anticoagulation therapy of Lovenox 30 mg subcutaneous every 12 hours per anesthesia and pharmacy protocol. He was fitted with TED stockings bilaterally. These are allowed to be removed 1 hour per 8 hour shift. The patient was also fitted with the AV-I compression foot pumps bilaterally set at 80 mmHg. His calves have been nontender. There has been no evidence of any DVTs. Negative Homans sign. Heels were elevated off the bed using rolled towels.   The patient has denied any chest pain or shortness of breath. Vital signs have been stable. He has been afebrile. Hemodynamically he was stable. No transfusions were given other than the Autovac transfusion given the first 6 hours postoperatively.   Physical therapy was initiated on day 1 for gait training and transfers. The patient has done very well. He was ambulating 250 feet on day 1. He was able go up and down 4 sets of steps. He was independent with bed to chair transfers. Occupational therapy was also initiated on day 1 for ADL and assistive devices.   The patient's IV, Foley and Hemovac were discontinued on day 2 along with a dressing change. There was no drainage or signs of infection from the wound. The Polar Care was reapplied to the surgical leg, maintaining a temperature of 40 to 50 degrees Fahrenheit.  DRUG ALLERGIES:  VICRYL SUTURES.   MEDICATIONS:  The patient is to resume his regular medications that he was on prior to admission. He was given a prescription for oxycodone 5 to 10 mg q.4 to 6 hours p.r.n. for pain, tramadol 50 to 100 mg q.4 to 6 hours for pain and  Lovenox 40 mg subcutaneously daily for 14 days, then discontinue and begin taking one 81 mg enteric-coated aspirin unless contraindicated.   1.  The patient will continue weightbearing as tolerated. Continue to use a walker until cleared by physical therapy to go to a quad cane.  2.  He will receive home health PT.  3.  Elevate the lower extremities. Elevate the heels off the bed.  4.  Continue with TED stockings bilaterally. These are to be removed at night but are to be worn during the day.  5.  Incentive spirometer q.1 hour while awake.  6.  Encourage cough deep  breathing q.2 hours while awake.  7.  He is placed on a regular diet. 8.  Continue to use Polar Care to the surgical leg and maintain temperature of 40 to 50 degrees Fahrenheit.  9.  Change the dressing as needed.  10.  He is not to take a shower until the staples are removed.  11.  He is to call the clinic if any bright red bleeding from the incision or wound or any fevers of 101.5 or greater.  12.  He has a followup appointment in [facility] on October 30th at 8:45.   PAST MEDICAL HISTORY:  1.  Hypertension.  2.  Hypercholesterolemia.  3.  Arthritis. 4.  Depression/anxiety.  5.  COPD.  6.  Erectile dysfunction.  7.  Carpal tunnel syndrome.  8.  Gastroesophageal reflux disease.  9.  Colonic polyps.  ____________________________ Vance Peper, PA [id number] D: 01/16/2013 11:52:00 ET T: 01/16/2013 12:13:50 ET JOB#: [id number]  cc: Vance Peper, PA, <Dictator> Priest Lockridge PA ELECTRONICALLY SIGNED 01/27/2013 8:23

## 2014-07-23 NOTE — Op Note (Signed)
PATIENT NAME:  Alan [name], Alan [name] MR#:  [id number] DATE OF BIRTH:  08/01/1945  DATE OF PROCEDURE:  01/14/2013  PREOPERATIVE DIAGNOSIS: Degenerative arthrosis of the left knee.   POSTOPERATIVE DIAGNOSIS: Degenerative arthrosis of the left knee.   PROCEDURE PERFORMED: Left total knee arthroplasty using computer-assisted navigation.   SURGEON: Laurice Record. Holley Bouche., M.D.   ASSISTANT: Vance Peper, PA-C (required to maintain retraction throughout the procedure).   ANESTHESIA: Spinal.   ESTIMATED BLOOD LOSS: 50 mL.   FLUIDS REPLACED: 1900 mL of crystalloid.   TOURNIQUET TIME: 93 minutes.   DRAINS: Two medium drains to reinfusion system.   SOFT TISSUE RELEASES: Anterior cruciate ligament, posterior cruciate ligament, deep and superficial medial collateral ligament, and patellofemoral ligament.   IMPLANTS UTILIZED: DePuy PFC Sigma size 5 posterior stabilized femoral component (cemented), size 5 MBT tibial component (cemented), a 38 mm 3 peg oval dome patella (cemented), and a 10 mm stabilized rotating platform polyethylene insert.   INDICATIONS FOR SURGERY: The patient is a 69 year old gentleman who has been seen for complaints of progressive left knee pain. X-rays demonstrated severe degenerative changes in tricompartmental fashion with relative varus deformity. After discussion of the risks and benefits of surgical intervention, the patient expressed understanding of the risks and benefits and agreed with plans for surgical intervention.   PROCEDURE IN DETAIL: The patient was brought into the operating room and, after adequate spinal anesthesia was achieved, a tourniquet was placed on the patient's upper left thigh. The patient's left knee and leg were cleaned and prepped with alcohol and DuraPrep and draped in the usual sterile fashion. A "timeout" was performed as per usual protocol. The left lower extremity was exsanguinated using an Esmarch, and the tourniquet was inflated to 300 mmHg. An  anterior longitudinal incision was made followed by a standard mid vastus approach. A moderate effusion was evacuated. The deep fibers of the medial collateral ligament were elevated in a subperiosteal fashion off the medial flare of the tibia so as to maintain a continuous soft tissue sleeve. The patella was subluxed laterally, and the patellofemoral ligament was incised. Inspection of the knee demonstrated significant degenerative changes most notably to the medial compartment. Osteophytes were debrided using a rongeur. Anterior and posterior cruciate ligaments were excised. Two 4.0 mm Schanz pins were inserted into the femur and into the tibia for attachment of the array of trackers used for computer-assisted navigation. Hip center was identified using circumduction technique. Distal landmarks were mapped using the computer. The distal femur and proximal tibia were mapped using the computer. Distal femoral cutting guide was positioned using computer-assisted navigation so as to achieve a 5-degree distal valgus cut. Cut was performed and verified using the computer. Distal femur was sized, and it was felt that a size 5 femoral component was appropriate. A size 5 cutting guide was positioned, and the anterior cut was performed and verified using the computer. This was followed by completion of the posterior and chamfer cuts. Attention was then directed to the proximal tibia. Medial and lateral menisci were excised. The extramedullary tibial cutting guide was positioned using computer-assisted navigation so as to achieve a 0-degree varus valgus alignment and 0-degree posterior slope. Cut was performed and verified using the computer. The proximal tibia was sized, and it was felt that a size 5 tibial tray was appropriate. Tibial and femoral trials were inserted followed by insertion of a 10 mm polyethylene trial. The knee was felt to be tight medially. A Cobb elevator was used to elevate the superficial  fibers of the  medial collateral ligament. This allowed for excellent mediolateral soft tissue balancing both in full extension and in flexion. Finally, the patella was cut and prepared so as to accommodate a 38 mm 3 peg oval dome patella. Patellar trial was placed, and the knee was placed through a range of motion with excellent patellar tracking appreciated. The femoral trial was then removed after debridement of posterior osteophytes. Central post hole for the tibial component was reamed followed by insertion of a keel punch. Tibial trial was then removed. The cut surfaces of bone were irrigated with copious amounts of normal saline with antibiotic solution using pulsatile lavage and then suctioned dry. Polymethyl methacrylate cement with gentamicin was prepared in the usual fashion using a vacuum mixer. Cement was applied to the cut surface of the proximal tibia as well as along the undersurface of a size 5 MBT tibial component. The tibial component was positioned and impacted into place. Excess cement was removed using [employer] fast streamer. Cement was then applied to the cut surface of the femur as well as on the posterior flanges of a size 5 posterior stabilized femoral component. Femoral component was positioned and impacted into place. Excess cement was removed using [employer] fast streamer. A 10 mm polyethylene trial was inserted, and the knee was brought in full extension with steady axial compression applied. Finally, cement was applied to the backside of a 38 mm 3 peg oval dome patella, and the patellar component was positioned and patellar clamp applied. Excess cement was removed using [employer] fast streamer.   After adequate curing of cement, the tourniquet was deflated after a total tourniquet time of 93 minutes. Hemostasis was achieved using electrocautery. The knee was irrigated with copious amounts of normal saline with antibiotic solution using pulsatile lavage and then suctioned dry. The knee was inspected for any residual  cement debris. Then, 20 mL of 1.3% Exparel in 40 mL of normal saline was injected along the posterior capsule, medial and lateral gutters, and along the arthrotomy site. A 10 mm stabilized rotating platform polyethylene insert was inserted, and the knee was placed through a range of motion with excellent mediolateral soft tissue balancing appreciated and excellent patellar tracking noted. Two medium drains were placed in the wound bed and brought out through a separate stab incision to be attached to reinfusion system. The medial parapatellar portion of the incision was reapproximated using #1 Ethibond. Then, 30 mL of 0.25% Marcaine with epinephrine was injected along the subcutaneous tissue. The subcutaneous tissue was then closed in layers using first #0 Monocryl followed by #2-0 Monocryl. The skin was closed with skin staples. A sterile dressing was applied.   The patient tolerated the procedure well. He was transported to the recovery room in stable condition.   ____________________________ Laurice Record. Holley Bouche., MD jph:gb D: 01/14/2013 19:55:32 ET T: 01/14/2013 20:12:05 ET JOB#: [id number]  cc: Laurice Record. Holley Bouche., MD, <Dictator> JAMES P Holley Bouche MD ELECTRONICALLY SIGNED 01/20/2013 22:34

## 2014-07-30 ENCOUNTER — Other Ambulatory Visit: Payer: Self-pay | Admitting: Family Medicine

## 2014-08-25 ENCOUNTER — Other Ambulatory Visit: Payer: Commercial Managed Care - HMO

## 2014-08-26 ENCOUNTER — Other Ambulatory Visit: Payer: Self-pay | Admitting: Primary Care

## 2014-08-26 DIAGNOSIS — R7303 Prediabetes: Secondary | ICD-10-CM

## 2014-08-31 ENCOUNTER — Other Ambulatory Visit (INDEPENDENT_AMBULATORY_CARE_PROVIDER_SITE_OTHER): Payer: Commercial Managed Care - HMO

## 2014-08-31 DIAGNOSIS — R7303 Prediabetes: Secondary | ICD-10-CM

## 2014-08-31 DIAGNOSIS — R7309 Other abnormal glucose: Secondary | ICD-10-CM | POA: Diagnosis not present

## 2014-08-31 LAB — HEMOGLOBIN A1C: Hgb A1c MFr Bld: 5.5 % (ref 4.6–6.5)

## 2014-09-01 ENCOUNTER — Encounter: Payer: Self-pay | Admitting: Primary Care

## 2014-09-01 ENCOUNTER — Ambulatory Visit: Payer: Commercial Managed Care - HMO | Admitting: Family Medicine

## 2014-09-01 ENCOUNTER — Ambulatory Visit (INDEPENDENT_AMBULATORY_CARE_PROVIDER_SITE_OTHER): Payer: Commercial Managed Care - HMO | Admitting: Primary Care

## 2014-09-01 VITALS — BP 142/72 | HR 76 | Temp 98.1°F | Ht 70.0 in | Wt 250.8 lb

## 2014-09-01 DIAGNOSIS — R7309 Other abnormal glucose: Secondary | ICD-10-CM

## 2014-09-01 DIAGNOSIS — I10 Essential (primary) hypertension: Secondary | ICD-10-CM

## 2014-09-01 DIAGNOSIS — R7303 Prediabetes: Secondary | ICD-10-CM | POA: Insufficient documentation

## 2014-09-01 DIAGNOSIS — E785 Hyperlipidemia, unspecified: Secondary | ICD-10-CM

## 2014-09-01 NOTE — Progress Notes (Signed)
Pre visit review using our clinic review tool, if applicable. No additional management support is needed unless otherwise documented below in the visit note. 

## 2014-09-01 NOTE — Patient Instructions (Signed)
Your Hemoglobin A1C (test for diabetes levels) has reduced to 5.5. Your previous level was 5.9.   Continue your efforts towards a healthy diet. Limit carbohydrates in the form of white bread, rice, pasta, cakes, cookies, sugary drinks, etc. Increase your consumption of fresh fruits and vegetables. Be sure to drink plenty of water daily.  It was a pleasure meeting you today! Follow up in three months for re-evaluation.

## 2014-09-01 NOTE — Assessment & Plan Note (Signed)
Elevated in February 2016. Managed on Atorvastatin 40 mg. Will re-evaluate at next visit in 3 months. Continue efforts towards healthy diet and exercise.

## 2014-09-01 NOTE — Progress Notes (Signed)
Subjective:    [name], male    DOB: 1945/10/13, 69 y.o.   MRN: [id number]  HPI  Alan [name] is a 69 year old male who presents today for follow up. He was evaluated in February 2016 for an annual wellness exam and was noted to have an A1C of 5.9. At that time he endorsed an unhealthy diet consisting of fast food, fried foods, sweet tea, etc. Since then he's improved his diet significantly by limiting carbohydrates, fatty foods, and fast food.  Current Diet: Breakfast: [place] sausage, wheat toast, fat free cheese. Lunch: Nothing typically Dinner: [employer], salad with broccoli and cauliflower.    Exercise: He is active throughout most days and intentionally exercises 2-3 days a week by walking for an hour in the morning.  Wt Readings from Last 3 Encounters:  09/01/14 250 lb 12.8 oz (113.762 kg)  05/03/14 253 lb (114.76 kg)  11/16/13 247 lb 4 oz (112.152 kg)     Review of Systems  Respiratory: Negative for cough and shortness of breath.   Cardiovascular: Negative for chest pain and leg swelling.  Neurological: Negative for light-headedness and numbness.       Past Medical History  Diagnosis Date  . Osteoarthrosis, unspecified whether generalized or localized, lower leg   . Chronic airway obstruction, not elsewhere classified   . Allergic rhinitis, cause unspecified   . Lipoma of unspecified site   . Carpal tunnel syndrome   . Unspecified essential hypertension   . Other and unspecified hyperlipidemia   . Esophageal reflux   . Personal history of colonic polyps   . Personal history of other malignant neoplasm of skin   . Glaucoma (increased eye pressure)     History   Social History  . Marital Status: Married    Spouse Name: N/A  . Number of Children: 2  . Years of Education: N/A   Occupational History  . [employer]    Social History Main Topics  . Smoking status: Former Smoker -- 3.00 packs/day for 25 years    Quit  date: 11/23/1996  . Smokeless tobacco: Never Used  . Alcohol Use: 0.0 oz/week    0 Standard drinks or equivalent per week     Comment: 4 mixed drinks per week  . Drug Use: No  . Sexual Activity: Not on file   Other Topics Concern  . Not on file   Social History Narrative   Regular exercise -- no             Past Surgical History  Procedure Laterality Date  . Total knee arthroplasty      03-2009 [facility]  . Bone spur  06-2003    right thumb  . Shoulder surgery  03-24-2007    removal bone spur  . Knee arthroscopy  09-2007    partial medial mesicecotmy, patellar chonfroplasty  . 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 Diabetes Mother   . Hyperlipidemia Mother   . Hypertension Mother   . Heart attack Mother   . Obesity Mother   . Hyperlipidemia Father   . Hypertension Father   . Lung cancer Father   . Hypertension Sister   . Hyperlipidemia Sister   . Hyperlipidemia Brother   . Hypertension Brother   . Hyperlipidemia Brother   . Hypertension Brother   . Lung cancer Sister   . Hyperlipidemia Sister   .  Colon polyps Father     No Known Allergies  Current Outpatient Prescriptions on File Prior to Visit  Medication Sig Dispense Refill  . amLODipine (NORVASC) 5 MG tablet TAKE 1 TABLET EVERY DAY 90 tablet 0  . aspirin 81 MG tablet Take 81 mg by mouth daily.      Marland Kitchen atorvastatin (LIPITOR) 40 MG tablet TAKE 1 TABLET EVERY DAY 90 tablet 0  . fish oil-omega-3 fatty acids 1000 MG capsule Take 2 g by mouth daily.      Marland Kitchen latanoprost (XALATAN) 0.005 % ophthalmic solution As directed    . lisinopril (PRINIVIL,ZESTRIL) 40 MG tablet TAKE 1 TABLET EVERY DAY 90 tablet 0  . Multiple Vitamin (MULTIVITAMIN) tablet Take 1 tablet by mouth daily.      Marland Kitchen omeprazole (PRILOSEC) 20 MG capsule Take 20 mg by mouth daily.      . sertraline (ZOLOFT) 50 MG tablet TAKE 1 TABLET EVERY DAY 90 tablet 0   No current facility-administered  medications on file prior to visit.    BP 142/72 mmHg  Pulse 76  Temp(Src) 98.1 F (36.7 C) (Oral)  Ht 5\' 10"  (1.778 m)  Wt 250 lb 12.8 oz (113.762 kg)  BMI 35.99 kg/m2  SpO2 97%    Objective:   Physical Exam  Constitutional: He is oriented to person, place, and time.  Cardiovascular: Normal rate and regular rhythm.   Pulmonary/Chest: Effort normal and breath sounds normal.  Neurological: He is alert and oriented to person, place, and time.  Skin: Skin is warm and dry.          Assessment & Plan:

## 2014-09-01 NOTE — Assessment & Plan Note (Signed)
Present initially in February 2016 with 5.9.  Improved today to 5.5. Discussed the importance of continuing his efforts towards a healthy and exercise. Encouraged more exercise and to limit sweet tea consumption. Follow up in 3 months for re-check.

## 2014-09-01 NOTE — Assessment & Plan Note (Signed)
Stable today. Managed on amlodipine and lisinopril. Continue to monitor

## 2014-09-06 DIAGNOSIS — H40013 Open angle with borderline findings, low risk, bilateral: Secondary | ICD-10-CM | POA: Diagnosis not present

## 2014-09-06 DIAGNOSIS — H25013 Cortical age-related cataract, bilateral: Secondary | ICD-10-CM | POA: Diagnosis not present

## 2014-11-04 ENCOUNTER — Other Ambulatory Visit: Payer: Self-pay | Admitting: Primary Care

## 2014-11-04 NOTE — Telephone Encounter (Signed)
Refill request.  Last prescribed on  07/30/14   lisinopril  40 MG tablet  Dispense: 90 tablet   Refills: 0     07/30/14    amlodipine  5 MG tablet  Dispense: 90 tablet   Refills: 0    07/30/14   atorvastatin 40 MG tablet  Dispense: 90 tablet   Refills: 0     07/30/14  sertraline  50 MG tablet  Dispense: 90 tablet   Refills: 0

## 2014-11-24 ENCOUNTER — Other Ambulatory Visit: Payer: Self-pay | Admitting: Primary Care

## 2014-11-24 DIAGNOSIS — E785 Hyperlipidemia, unspecified: Secondary | ICD-10-CM

## 2014-11-24 DIAGNOSIS — R7303 Prediabetes: Secondary | ICD-10-CM

## 2014-11-30 ENCOUNTER — Other Ambulatory Visit (INDEPENDENT_AMBULATORY_CARE_PROVIDER_SITE_OTHER): Payer: Commercial Managed Care - HMO

## 2014-11-30 DIAGNOSIS — R7303 Prediabetes: Secondary | ICD-10-CM

## 2014-11-30 DIAGNOSIS — E785 Hyperlipidemia, unspecified: Secondary | ICD-10-CM

## 2014-11-30 DIAGNOSIS — R7309 Other abnormal glucose: Secondary | ICD-10-CM | POA: Diagnosis not present

## 2014-11-30 LAB — HEMOGLOBIN A1C: HEMOGLOBIN A1C: 5.5 % (ref 4.6–6.5)

## 2014-11-30 LAB — LIPID PANEL
CHOL/HDL RATIO: 2
Cholesterol: 196 mg/dL (ref 0–200)
HDL: 83.8 mg/dL (ref >39.00)
LDL Cholesterol: 100 mg/dL — ABNORMAL HIGH (ref 0–99)
NONHDL: 112.52
Triglycerides: 64 mg/dL (ref 0.0–149.0)
VLDL: 12.8 mg/dL (ref 0.0–40.0)

## 2014-12-03 ENCOUNTER — Encounter: Payer: Self-pay | Admitting: Primary Care

## 2014-12-03 ENCOUNTER — Ambulatory Visit (INDEPENDENT_AMBULATORY_CARE_PROVIDER_SITE_OTHER): Payer: Commercial Managed Care - HMO | Admitting: Primary Care

## 2014-12-03 VITALS — BP 126/72 | HR 75 | Temp 97.8°F | Ht 70.0 in | Wt 245.8 lb

## 2014-12-03 DIAGNOSIS — R7309 Other abnormal glucose: Secondary | ICD-10-CM

## 2014-12-03 DIAGNOSIS — E785 Hyperlipidemia, unspecified: Secondary | ICD-10-CM

## 2014-12-03 DIAGNOSIS — I10 Essential (primary) hypertension: Secondary | ICD-10-CM

## 2014-12-03 DIAGNOSIS — R21 Rash and other nonspecific skin eruption: Secondary | ICD-10-CM | POA: Diagnosis not present

## 2014-12-03 MED ORDER — TRIAMCINOLONE ACETONIDE 0.1 % EX CREA: 1.0000 "application " | TOPICAL_CREAM | Freq: Two times a day (BID) | CUTANEOUS | Status: DC

## 2014-12-03 NOTE — Assessment & Plan Note (Signed)
Stable today. Continue current regimen. 

## 2014-12-03 NOTE — Assessment & Plan Note (Signed)
Currently managed on atorvastain 40. Working on weight loss through diet and exercise. Denies myalgias. Will recheck lipids during next physical.

## 2014-12-03 NOTE — Patient Instructions (Addendum)
Start weaning off of Sertraline tablets. Start taking 1/2 tablet daily for 7 days, then take 1/2 tablet every other day for 7 days, then stop.  Please call me in three weeks with an update on how you're doing off the medication.  Continue your efforts towards a healthy diet. Congratulations on your weight loss efforts!  Apply triamcinolone cream twice daily to area for itching.  Follow up in February 2017 for Medicare Wellness Visit.  It was a pleasure to see you today!

## 2014-12-03 NOTE — Progress Notes (Signed)
Pre visit review using our clinic review tool, if applicable. No additional management support is needed unless otherwise documented below in the visit note. 

## 2014-12-03 NOTE — Assessment & Plan Note (Signed)
Repeat A1C of 5.5 in August 2016. Working on weight loss through diet and exercise. Will recheck during physical next year.

## 2014-12-03 NOTE — Progress Notes (Signed)
Subjective:    [name], male    DOB: [birth year], 70 y.o.   MRN: [id number]  HPI  Alan [name] is a 69 year old male who presents today for follow up of borderline diabetes. He was noted to have an A1C of 5.9 in February 2016 and repeat A1C of 5.5 in May 2016. He had been working hard to improve his diet which once consisted of fast food, fried foods, and sweet tea.   His recent A1C was 5.5 and was obtained in August 2016. He is currently not managed on medication.  Diet constists of:  Breakfast: [place] patty on wheat toast Lunch: Skips Dinner: Baked and grilled chicken and fish, vegetables, salads Snacks: None Beverages: Water, occasional beer.  Wt Readings from Last 3 Encounters:  12/03/14 245 lb 12.8 oz (111.494 kg)  09/01/14 250 lb 12.8 oz (113.762 kg)  05/03/14 253 lb (114.76 kg)     2) Essential hypertension: Currently managed on Amlodipine and lisinopril. Stable in the clinic today. Denies chest pain, headaches, dizziness.  3) Generalized Anxiety Disorder: Diagnosed several years ago. Feels well managed and would like to try to come off meds. He feels much better (less stress and anxiety) since he's retired. Currently managed on Sertraline 50 mg. Denies SI/HI.  4) Hyperlipidemia: Currently managed on atorvastatin 40mg  and fish oil. Denies myalgias. He's been working to lose weight through improvement in his diet.  Review of Systems  Respiratory: Negative for shortness of breath.   Cardiovascular: Negative for chest pain.  Neurological: Negative for dizziness, numbness and headaches.  Psychiatric/Behavioral: Negative for suicidal ideas and sleep disturbance. The patient is not nervous/anxious.        Past Medical History  Diagnosis Date  . Osteoarthrosis, unspecified whether generalized or localized, lower leg   . Chronic airway obstruction, not elsewhere classified   . Allergic rhinitis, cause unspecified   . Lipoma of unspecified site   .  Carpal tunnel syndrome   . Unspecified essential hypertension   . Other and unspecified hyperlipidemia   . Esophageal reflux   . Personal history of colonic polyps   . Personal history of other malignant neoplasm of skin   . Glaucoma (increased eye pressure)     Social History   Social History  . Marital Status: Married    Spouse Name: N/A  . Number of Children: 2  . Years of Education: N/A   Occupational History  . [employer]    Social History Main Topics  . Smoking status: Former Smoker -- 3.00 packs/day for 25 years    Quit date: 11/23/1996  . Smokeless tobacco: Never Used  . Alcohol Use: 0.0 oz/week    0 Standard drinks or equivalent per week     Comment: 4 mixed drinks per week  . Drug Use: No  . Sexual Activity: Not on file   Other Topics Concern  . Not on file   Social History Narrative   Regular exercise -- no             Past Surgical History  Procedure Laterality Date  . Total knee arthroplasty      03-2009 [facility]  . Bone spur  06-2003    right thumb  . Shoulder surgery  03-24-2007    removal bone spur  . Knee arthroscopy  09-2007    partial medial mesicecotmy, patellar chonfroplasty  . 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 Diabetes Mother   . Hyperlipidemia Mother   . Hypertension Mother   . Heart attack Mother   . Obesity Mother   . Hyperlipidemia Father   . Hypertension Father   . Lung cancer Father   . Hypertension Sister   . Hyperlipidemia Sister   . Hyperlipidemia Brother   . Hypertension Brother   . Hyperlipidemia Brother   . Hypertension Brother   . Lung cancer Sister   . Hyperlipidemia Sister   . Colon polyps Father     No Known Allergies  Current Outpatient Prescriptions on File Prior to Visit  Medication Sig Dispense Refill  . amLODipine (NORVASC) 5 MG tablet TAKE 1 TABLET EVERY DAY 90 tablet 0  . aspirin 81 MG tablet Take 81 mg by mouth  daily.      Marland Kitchen atorvastatin (LIPITOR) 40 MG tablet TAKE 1 TABLET EVERY DAY 90 tablet 0  . fish oil-omega-3 fatty acids 1000 MG capsule Take 2 g by mouth daily.      Marland Kitchen latanoprost (XALATAN) 0.005 % ophthalmic solution As directed    . lisinopril (PRINIVIL,ZESTRIL) 40 MG tablet TAKE 1 TABLET EVERY DAY 90 tablet 0  . Multiple Vitamin (MULTIVITAMIN) tablet Take 1 tablet by mouth daily.      Marland Kitchen omeprazole (PRILOSEC) 20 MG capsule Take 20 mg by mouth daily.      . sertraline (ZOLOFT) 50 MG tablet TAKE 1 TABLET EVERY DAY 90 tablet 0   No current facility-administered medications on file prior to visit.    BP 126/72 mmHg  Pulse 75  Temp(Src) 97.8 F (36.6 C) (Oral)  Ht 5\' 10"  (1.778 m)  Wt 245 lb 12.8 oz (111.494 kg)  BMI 35.27 kg/m2  SpO2 98%    Objective:   Physical Exam  Constitutional: He appears well-nourished.  Cardiovascular: Normal rate and regular rhythm.   Pulmonary/Chest: Effort normal and breath sounds normal.  Skin: Skin is warm and dry.  Psychiatric: He has a normal mood and affect.          Assessment & Plan:

## 2014-12-24 ENCOUNTER — Telehealth: Payer: Self-pay | Admitting: Primary Care

## 2014-12-24 NOTE — Telephone Encounter (Signed)
Will you please check on Alan [name] regarding his anxiety since we removed him from his Zoloft? Thanks.

## 2014-12-27 ENCOUNTER — Telehealth: Payer: Self-pay | Admitting: *Deleted

## 2014-12-27 NOTE — Telephone Encounter (Signed)
Yes, I am aware. I would like to know how he's doing off of the medication. Thanks.

## 2014-12-27 NOTE — Telephone Encounter (Signed)
Did leave a message to call back on Friday, 12/24/14. Just left another voicemail today 12/27/14.

## 2014-12-27 NOTE — Telephone Encounter (Signed)
Did leave voicemail for patient to call back on Friday, 12/24/14.  Left another voicemail on 12/27/14.

## 2014-12-27 NOTE — Telephone Encounter (Signed)
Patient left a voicemail that he is no longer on the generic Zoloft and wanted to let you know that.

## 2014-12-29 NOTE — Telephone Encounter (Signed)
See phone note 12/25/14

## 2014-12-29 NOTE — Telephone Encounter (Signed)
Left a message for patient to return my call. 

## 2014-12-29 NOTE — Telephone Encounter (Signed)
Message left for patient to return my call.  

## 2014-12-29 NOTE — Telephone Encounter (Signed)
Patient returned my call. He states that he is feeling good, and says he feels like he is doing well off of the Zoloft. Thanks!

## 2015-01-04 ENCOUNTER — Telehealth: Payer: Self-pay | Admitting: Primary Care

## 2015-01-04 NOTE — Telephone Encounter (Signed)
Message left for patient to return my call.  

## 2015-01-04 NOTE — Telephone Encounter (Signed)
Why is he requesting an evaluation with a podiatrist? I will need this information in order to place the referral. Thanks.

## 2015-01-04 NOTE — Telephone Encounter (Signed)
Patient is calling to get a Referral for the [facility] in [place], Patient needs a [facility] Authorization also. Please call patient wih Appt when referral is put in.

## 2015-01-05 ENCOUNTER — Other Ambulatory Visit: Payer: Self-pay | Admitting: Primary Care

## 2015-01-05 ENCOUNTER — Telehealth: Payer: Self-pay | Admitting: Primary Care

## 2015-01-05 DIAGNOSIS — M79673 Pain in unspecified foot: Secondary | ICD-10-CM

## 2015-01-05 NOTE — Telephone Encounter (Signed)
Called and spoken to patient. He is requesting a referral in hoping to help with his foot pain, he does have osteoarthritis of toe and had injections.

## 2015-01-05 NOTE — Telephone Encounter (Signed)
Referral placed.

## 2015-01-13 ENCOUNTER — Ambulatory Visit (INDEPENDENT_AMBULATORY_CARE_PROVIDER_SITE_OTHER): Payer: Commercial Managed Care - HMO | Admitting: Podiatry

## 2015-01-13 ENCOUNTER — Encounter: Payer: Self-pay | Admitting: Podiatry

## 2015-01-13 ENCOUNTER — Ambulatory Visit (INDEPENDENT_AMBULATORY_CARE_PROVIDER_SITE_OTHER): Payer: Commercial Managed Care - HMO

## 2015-01-13 VITALS — BP 148/82 | HR 67 | Resp 16 | Ht 72.0 in | Wt 245.0 lb

## 2015-01-13 DIAGNOSIS — M205X9 Other deformities of toe(s) (acquired), unspecified foot: Secondary | ICD-10-CM | POA: Diagnosis not present

## 2015-01-13 DIAGNOSIS — M2010 Hallux valgus (acquired), unspecified foot: Secondary | ICD-10-CM | POA: Diagnosis not present

## 2015-01-13 DIAGNOSIS — M79673 Pain in unspecified foot: Secondary | ICD-10-CM | POA: Diagnosis not present

## 2015-01-13 DIAGNOSIS — M7750 Other enthesopathy of unspecified foot: Secondary | ICD-10-CM | POA: Diagnosis not present

## 2015-01-13 DIAGNOSIS — M778 Other enthesopathies, not elsewhere classified: Secondary | ICD-10-CM

## 2015-01-13 DIAGNOSIS — Q828 Other specified congenital malformations of skin: Secondary | ICD-10-CM

## 2015-01-13 DIAGNOSIS — M779 Enthesopathy, unspecified: Secondary | ICD-10-CM

## 2015-01-13 MED ORDER — MELOXICAM 15 MG PO TABS: 15.0000 mg | ORAL_TABLET | Freq: Every day | ORAL | Status: DC

## 2015-01-13 NOTE — Progress Notes (Signed)
   Subjective:    [name], male    DOB: 1946/03/10, 69 y.o.   MRN: [id number]  HPI: 69 year old male presents today on advice of his PCP to follow up with [place]. His PCP has suggested that he increase his activity level to get his blood sugar under better control that he is not considered a diabetic as of yet. He also recommended activity to help lose weight. He states that over the past few months has been walking 2 miles several times a week and has started to develop pain in his big toe and his heel of his right foot primarily but generalized tenderness in both feet bilaterally. He states that he's had a shot in his first metatarsophalangeal joint which alleviated symptoms for short period of time. He denies any trauma. He is also concerned about the tenderness between the third and fourth metatarsal heads of his right foot. Is also concerned about a callus to the lateral aspect of his third digit right foot. He states that the toe seemed to be rubbing and are more painful.    Review of Systems  Musculoskeletal: Positive for arthralgias.  All other systems reviewed and are negative.      Objective:   Physical Exam: 69 year old male in no apparent distress presents today vital signs stable alert and oriented 3 hemoglobin A1c 5.4. Pulses are strongly palpable. Neurologic sensorium is intact percent Semmes-Weinstein monofilament. Deep tendon reflexes are intact bilateral muscle strength was 5 over 5 dorsiflexion plantar flexors and inverters everters all intrinsic musculature is intact. Orthopedic evaluation of his drains all joints distal to the ankle level range of motion without crepitation. He has pain on palpation medial calcaneal tubercle of the right heel. Palpable Mulder's click to the third interdigital space of the right foot. Mild hammertoe deformities #3 number for the right foot. He also has hallux limitus with pain on range of motion first metatarsophalangeal joints  bilateral right greater than left. Radiographic evaluation does demonstrates soft tissue increase in density at the plantar fascial calcaneal insertion site right over left. Joint space narrowing of the first metatarsophalangeal joint greater than the left. Cutaneous evaluation Mr. is supple well-hydrated cutis no erythema edema cellulitis drainage or odor. Reactive hyperkeratotic lateral aspect of the third digit right foot porokeratosis in nature.        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 Capsulitis. Plantar fasciitis right.  Plan: Discussed etiology pathology conservative versus surgical therapies. At this point I wrote a prescription for meloxicam 15 mg #30 one by mouth daily with 3 refills. I injected the right heel today and I injected the right first metatarsophalangeal joint with dexamethasone after a thorough Betadine skin prep. I debrided the reactive hyperkeratosis to the third digit of the right foot and placed padding. We discussed appropriate shoe gear stretching exercises ice therapy issues or modifications. It we also dispensed a plantar fascial brace to the right foot. Follow up with him in 1 month  [facility] DPM

## 2015-01-24 ENCOUNTER — Other Ambulatory Visit: Payer: Self-pay | Admitting: Primary Care

## 2015-01-24 DIAGNOSIS — E785 Hyperlipidemia, unspecified: Secondary | ICD-10-CM

## 2015-01-24 DIAGNOSIS — I10 Essential (primary) hypertension: Secondary | ICD-10-CM

## 2015-01-24 NOTE — Telephone Encounter (Signed)
Electronically refill request for  lisinopril (PRINIVIL,ZESTRIL) 40 MG tablet   TAKE 1 TABLET EVERY DAY  Dispense: 90 tablet   Refills: 0     atorvastatin (LIPITOR) 40 MG tablet   TAKE 1 TABLET EVERY DAY  Dispense: 90 tablet   Refills: 0      amlodipine (NORVASC) 5 MG tablet   TAKE 1 TABLET EVERY DAY  Dispense: 90 tablet   Refills: 0   Last prescribed on 11/04/2014. Last seen on 12/03/2014.Next CPE appt on 05/06/2015.

## 2015-02-15 ENCOUNTER — Encounter: Payer: Self-pay | Admitting: Podiatry

## 2015-02-15 ENCOUNTER — Ambulatory Visit (INDEPENDENT_AMBULATORY_CARE_PROVIDER_SITE_OTHER): Payer: Medicare HMO | Admitting: Podiatry

## 2015-02-15 VITALS — BP 157/94 | HR 86 | Resp 16

## 2015-02-15 DIAGNOSIS — Q828 Other specified congenital malformations of skin: Secondary | ICD-10-CM

## 2015-02-15 DIAGNOSIS — M779 Enthesopathy, unspecified: Secondary | ICD-10-CM

## 2015-02-15 DIAGNOSIS — M778 Other enthesopathies, not elsewhere classified: Secondary | ICD-10-CM

## 2015-02-15 DIAGNOSIS — M205X9 Other deformities of toe(s) (acquired), unspecified foot: Secondary | ICD-10-CM | POA: Diagnosis not present

## 2015-02-15 DIAGNOSIS — M7750 Other enthesopathy of unspecified foot: Secondary | ICD-10-CM | POA: Diagnosis not present

## 2015-02-15 DIAGNOSIS — G5761 Lesion of plantar nerve, right lower limb: Secondary | ICD-10-CM | POA: Diagnosis not present

## 2015-02-15 DIAGNOSIS — G5781 Other specified mononeuropathies of right lower limb: Secondary | ICD-10-CM

## 2015-02-15 NOTE — Progress Notes (Signed)
Presents today for follow-up of plantar fasciitis right heel as well as capsulitis first metatarsophalangeal joint right. States is having pain to the third and fourth toes of the right foot that generates proximally to the area of irritation between the toes. States that the injections from last visit helped considerably.  Objective: Vital signs stable alert and oriented 3. Pulses are palpable. No reproducible pain with exception of tenderness on range of motion of the first metatarsophalangeal joint and a palpable neuroma to the third interdigital space right foot.  Assessment: Resolving plantar fasciitis. Capsulitis first metatarsophalangeal joint right improved. Neuroma third interdigital space right.  Plan: Discussed etiology and pathology conservative versus surgical therapies. At this point I recommended injection to the third interdigital space of the right foot. I also recommended that we hold off 1 month before we reinject first metatarsophalangeal joint. He will continue anti-inflammatories and I will follow-up with him in 1 month.

## 2015-03-04 ENCOUNTER — Ambulatory Visit (INDEPENDENT_AMBULATORY_CARE_PROVIDER_SITE_OTHER): Payer: Commercial Managed Care - HMO | Admitting: Primary Care

## 2015-03-04 ENCOUNTER — Encounter: Payer: Self-pay | Admitting: Primary Care

## 2015-03-04 ENCOUNTER — Ambulatory Visit (INDEPENDENT_AMBULATORY_CARE_PROVIDER_SITE_OTHER)
Admission: RE | Admit: 2015-03-04 | Discharge: 2015-03-04 | Disposition: A | Discharge status: Home / Self Care | Payer: Commercial Managed Care - HMO | Source: Ambulatory Visit | Attending: Primary Care | Admitting: Primary Care

## 2015-03-04 VITALS — BP 148/90 | HR 85 | Temp 97.8°F | Ht 72.0 in | Wt 249.8 lb

## 2015-03-04 DIAGNOSIS — R05 Cough: Secondary | ICD-10-CM | POA: Diagnosis not present

## 2015-03-04 DIAGNOSIS — J449 Chronic obstructive pulmonary disease, unspecified: Secondary | ICD-10-CM

## 2015-03-04 DIAGNOSIS — R059 Cough, unspecified: Secondary | ICD-10-CM

## 2015-03-04 NOTE — Assessment & Plan Note (Signed)
Smoked from age [age], 2 PPD. Quit 18 years ago.

## 2015-03-04 NOTE — Patient Instructions (Signed)
Stop Lisinopril 40 mg for 7 days. Call me with an update on your cough.  Increase Amlodipine to 10 mg, take 2 of your 5 mg tablets for now.  Check your blood pressure daily, around the same time of day, for the next week.   Ensure that you have rested for 30 minutes prior to checking your blood pressure. Record your readings and call them into our office next week.  Call me if you develop fevers, chills, start coughing up green mucous, or start to feel bad.  Complete xray(s) prior to leaving today. I will contact you regarding your results.  It was a pleasure to see you today!

## 2015-03-04 NOTE — Progress Notes (Signed)
Subjective:    [name], male    DOB: 1946/02/08, 70 y.o.   MRN: [id number]  HPI  Alan [name] is a 69 year old male who presents today with a chief complaint of cough. He also reports symptoms of nasal congestion.  His cough has been present daily for 6 weeks. His cough feels like a dry, hacking, tickle cough that will occur sometimes several times daily. Denies fevers, chills, sore throat. No sick contacts. Overall he's feeling well. History of tobacco abuse in the past, quit 18 years ago.  Review of Systems  Constitutional: Negative for fever and chills.  HENT: Positive for congestion. Negative for ear pain, postnasal drip, sinus pressure and sore throat.   Respiratory: Positive for cough. Negative for shortness of breath.   Cardiovascular: Negative for chest pain.  Musculoskeletal: Negative for myalgias.       Past Medical History  Diagnosis Date  . Osteoarthrosis, unspecified whether generalized or localized, lower leg   . Chronic airway obstruction, not elsewhere classified   . Allergic rhinitis, cause unspecified   . Lipoma of unspecified site   . Carpal tunnel syndrome   . Unspecified essential hypertension   . Other and unspecified hyperlipidemia   . Esophageal reflux   . Personal history of colonic polyps   . Personal history of other malignant neoplasm of skin   . Glaucoma (increased eye pressure)     Social History   Social History  . Marital Status: Married    Spouse Name: N/A  . Number of Children: 2  . Years of Education: N/A   Occupational History  . [employer]    Social History Main Topics  . Smoking status: Former Smoker -- 3.00 packs/day for 25 years    Quit date: 11/23/1996  . Smokeless tobacco: Never Used  . Alcohol Use: 0.0 oz/week    0 Standard drinks or equivalent per week     Comment: 4 mixed drinks per week  . Drug Use: No  . Sexual Activity: Not on file   Other Topics Concern  . Not on file    Social History Narrative   Regular exercise -- no             Past Surgical History  Procedure Laterality Date  . Total knee arthroplasty      03-2009 [facility]  . Bone spur  06-2003    right thumb  . Shoulder surgery  03-24-2007    removal bone spur  . Knee arthroscopy  09-2007    partial medial mesicecotmy, patellar chonfroplasty  . 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 Diabetes Mother   . Hyperlipidemia Mother   . Hypertension Mother   . Heart attack Mother   . Obesity Mother   . Hyperlipidemia Father   . Hypertension Father   . Lung cancer Father   . Hypertension Sister   . Hyperlipidemia Sister   . Hyperlipidemia Brother   . Hypertension Brother   . Hyperlipidemia Brother   . Hypertension Brother   . Lung cancer Sister   . Hyperlipidemia Sister   . Colon polyps Father     No Known Allergies  Current Outpatient Prescriptions on File Prior to Visit  Medication Sig Dispense Refill  . amLODipine (NORVASC) 5 MG tablet TAKE 1 TABLET EVERY DAY 90 tablet 1  . aspirin 81 MG tablet Take 81 mg by  mouth daily.      Marland Kitchen atorvastatin (LIPITOR) 40 MG tablet TAKE 1 TABLET EVERY DAY 90 tablet 1  . fish oil-omega-3 fatty acids 1000 MG capsule Take 2 g by mouth daily.      Marland Kitchen lisinopril (PRINIVIL,ZESTRIL) 40 MG tablet TAKE 1 TABLET EVERY DAY 90 tablet 1  . meloxicam (MOBIC) 15 MG tablet Take 1 tablet (15 mg total) by mouth daily. 30 tablet 3  . Multiple Vitamin (MULTIVITAMIN) tablet Take 1 tablet by mouth daily.      Marland Kitchen omeprazole (PRILOSEC) 20 MG capsule Take 20 mg by mouth daily.      Marland Kitchen triamcinolone cream (KENALOG) 0.1 % Apply 1 application topically 2 (two) times daily. 30 g 0   No current facility-administered medications on file prior to visit.    BP 148/90 mmHg  Pulse 85  Temp(Src) 97.8 F (36.6 C) (Oral)  Ht 6' (1.829 m)  Wt 249 lb 12.8 oz (113.309 kg)  BMI 33.87 kg/m2  SpO2 96%    Objective:    Physical Exam  Constitutional: He appears well-nourished.  HENT:  Right Ear: Tympanic membrane and ear canal normal.  Left Ear: Tympanic membrane and ear canal normal.  Nose: Right sinus exhibits no maxillary sinus tenderness and no frontal sinus tenderness. Left sinus exhibits no maxillary sinus tenderness and no frontal sinus tenderness.  Mouth/Throat: Oropharynx is clear and moist.  Eyes: Conjunctivae are normal. Pupils are equal, round, and reactive to light.  Neck: Neck supple.  Cardiovascular: Normal rate and regular rhythm.   Pulmonary/Chest: Effort normal and breath sounds normal. He has no rales.  Lymphadenopathy:    He has no cervical adenopathy.  Skin: Skin is warm and dry.          Assessment & Plan:  Cough:  Present for 6 weeks. No other symptoms except for mild nasal congestion. Exam unremarkable, lung clear. History of tobacco abuse, does not qualify for Low dose CT scan.  Due to history of tobacco abuse, will get xray. Suspect cough is related to Lisinopril 40 mg. Will hold for 7 days. Increase Amlodipine to 10 mg daily. He is to check his BP daily x 1 week and call back in with an update and readings. If cough improved will d/c lisinopril, consider adding HCTZ if needed.

## 2015-03-07 ENCOUNTER — Other Ambulatory Visit: Payer: Self-pay | Admitting: Primary Care

## 2015-03-07 ENCOUNTER — Telehealth: Payer: Self-pay | Admitting: Primary Care

## 2015-03-07 ENCOUNTER — Telehealth: Payer: Self-pay

## 2015-03-07 DIAGNOSIS — R05 Cough: Secondary | ICD-10-CM

## 2015-03-07 DIAGNOSIS — R059 Cough, unspecified: Secondary | ICD-10-CM

## 2015-03-07 MED ORDER — DOXYCYCLINE HYCLATE 100 MG PO TABS
100.0000 mg | ORAL_TABLET | Freq: Two times a day (BID) | ORAL | Status: DC
Start: 2015-03-07 — End: 2015-05-06

## 2015-03-07 MED ORDER — HYDROCODONE-HOMATROPINE 5-1.5 MG/5ML PO SYRP: 5.0000 mL | ORAL_SOLUTION | Freq: Three times a day (TID) | ORAL | Status: DC | PRN

## 2015-03-07 NOTE — Telephone Encounter (Signed)
Will send in Hycodan cough syrup. Please notify him that this may cause drowsiness.

## 2015-03-07 NOTE — Telephone Encounter (Signed)
Pt called back wanting to know what to do.  Not doing any better Pt stated he is hurting in his rib cage  [facility] number [phone]

## 2015-03-07 NOTE — Telephone Encounter (Signed)
Called and notified patient of [name]'s comments. Patient verbalized understanding. Will go pick up antibiotics. Patient stated that he would like something affordable to help with his cough. The coughing is so bad that he can't stand it. If there is something else he could take.

## 2015-03-07 NOTE — Telephone Encounter (Signed)
Pt was seen 03/04/15; pt stopped the lisinopril but the cough has worsened, now prod cough with yellow phlegm,pt has episodes of coughing. Also when coughs has pain in rt ribcage area; pt said feels like pulled muscle or cracked rib. No fever. Pt request cb. [facility].

## 2015-03-07 NOTE — Telephone Encounter (Signed)
Please have Alan [name] resume his lisinopril 40 mg and his amlodipine 5 mg as previously taken. I will send in Doxycycline antibiotics for acute symptoms. He will take 1 tablet by mouth twice daily for 7 days. Please have him call us if no improvement.  Thanks.

## 2015-03-08 NOTE — Telephone Encounter (Signed)
Patient came and pick up Rx in the late afternoon of 03/07/2015.

## 2015-04-05 DIAGNOSIS — H25013 Cortical age-related cataract, bilateral: Secondary | ICD-10-CM | POA: Diagnosis not present

## 2015-04-05 DIAGNOSIS — H40013 Open angle with borderline findings, low risk, bilateral: Secondary | ICD-10-CM | POA: Diagnosis not present

## 2015-04-19 ENCOUNTER — Encounter: Payer: Self-pay | Admitting: Podiatry

## 2015-04-19 ENCOUNTER — Ambulatory Visit (INDEPENDENT_AMBULATORY_CARE_PROVIDER_SITE_OTHER): Payer: PPO | Admitting: Podiatry

## 2015-04-19 VITALS — BP 138/52 | HR 92 | Resp 16

## 2015-04-19 DIAGNOSIS — G5781 Other specified mononeuropathies of right lower limb: Secondary | ICD-10-CM

## 2015-04-19 DIAGNOSIS — M779 Enthesopathy, unspecified: Principal | ICD-10-CM

## 2015-04-19 DIAGNOSIS — G5761 Lesion of plantar nerve, right lower limb: Secondary | ICD-10-CM

## 2015-04-19 DIAGNOSIS — M7751 Other enthesopathy of right foot: Secondary | ICD-10-CM

## 2015-04-19 DIAGNOSIS — M778 Other enthesopathies, not elsewhere classified: Secondary | ICD-10-CM

## 2015-04-19 NOTE — Progress Notes (Signed)
He presents today for follow-up of capsulitis to his first metatarsophalangeal joint plantar fasciitis right and neuroma third interdigital space right foot. He states that everything feels much better with the exception of the first metatarsophalangeal joint of the right foot. As we discussed before he does have osteoarthritic changes of the first metatarsophalangeal joint and may require surgical intervention in the future.  Objective: Vital signs are stable he is alert and oriented 3. Pulses are strongly palpable. He has no pain on palpation of the medial calcaneal tubercle he has no pain on palpation of the third interdigital space. He only has pain on end range of motion with dorsiflexion of the first metatarsophalangeal joint of the right foot. No erythema edema cellulitis drainage or odor.  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 Well-healing plantar fasciitis right foot. 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  Plan: Discussed etiology pathology conservative versus surgical therapies. At this point I recommended an intra-articular injection with Kenalog and local anesthetic. He agreed to this. This was performed after topical anesthesia was administered. I then injected the first metatarsophalangeal joint with 20 mg Kenalog and local anesthetic after a sterile Betadine skin prep. His symptoms were alleviated almost immediately and I will follow-up with him in 1 month.

## 2015-04-25 ENCOUNTER — Other Ambulatory Visit: Payer: Self-pay | Admitting: Primary Care

## 2015-04-25 DIAGNOSIS — E785 Hyperlipidemia, unspecified: Secondary | ICD-10-CM

## 2015-04-25 DIAGNOSIS — R7303 Prediabetes: Secondary | ICD-10-CM

## 2015-04-25 DIAGNOSIS — I10 Essential (primary) hypertension: Secondary | ICD-10-CM

## 2015-04-27 ENCOUNTER — Other Ambulatory Visit: Payer: Self-pay | Admitting: Primary Care

## 2015-04-27 DIAGNOSIS — I10 Essential (primary) hypertension: Secondary | ICD-10-CM

## 2015-04-27 DIAGNOSIS — E785 Hyperlipidemia, unspecified: Secondary | ICD-10-CM

## 2015-04-27 MED ORDER — ATORVASTATIN CALCIUM 40 MG PO TABS: 40.0000 mg | ORAL_TABLET | Freq: Every day | ORAL | Status: DC

## 2015-04-27 MED ORDER — AMLODIPINE BESYLATE 5 MG PO TABS: 5.0000 mg | ORAL_TABLET | Freq: Every day | ORAL | Status: DC

## 2015-04-27 MED ORDER — LISINOPRIL 40 MG PO TABS: 40.0000 mg | ORAL_TABLET | Freq: Every day | ORAL | Status: DC

## 2015-04-27 NOTE — Telephone Encounter (Signed)
Received faxed refill request from [facility] and 90 days supply  amlodipine (NORVASC) 5 MG tablet    TAKE 1 TABLET EVERY DAY  Dispense:  90     Refill:  1  atorvastatin (LIPITOR) 40 MG tablet   TAKE 1 TABLET EVERY DAY  Dispense:  90     Refill:  1  lisinopril (PRINIVIL,ZESTRIL) 40 MG tablet   TAKE 1 TABLET EVERY DAY  Dispense:  90     Refill:  1  Last prescribed on 01/24/2015. Last seen on 03/04/2015. CPE on 05/06/2015.

## 2015-04-29 ENCOUNTER — Other Ambulatory Visit (INDEPENDENT_AMBULATORY_CARE_PROVIDER_SITE_OTHER): Payer: PPO

## 2015-04-29 DIAGNOSIS — E785 Hyperlipidemia, unspecified: Secondary | ICD-10-CM | POA: Diagnosis not present

## 2015-04-29 DIAGNOSIS — I10 Essential (primary) hypertension: Secondary | ICD-10-CM

## 2015-04-29 DIAGNOSIS — R7303 Prediabetes: Secondary | ICD-10-CM

## 2015-04-29 LAB — COMPREHENSIVE METABOLIC PANEL
ALT: 20 U/L (ref 0–53)
AST: 16 U/L (ref 0–37)
Albumin: 4.1 g/dL (ref 3.5–5.2)
Alkaline Phosphatase: 56 U/L (ref 39–117)
BUN: 25 mg/dL — AB (ref 6–23)
CHLORIDE: 103 meq/L (ref 96–112)
CO2: 28 meq/L (ref 19–32)
Calcium: 9.1 mg/dL (ref 8.4–10.5)
Creatinine, Ser: 0.78 mg/dL (ref 0.40–1.50)
GFR: 104.95 mL/min (ref >60.00)
GLUCOSE: 117 mg/dL — AB (ref 70–99)
POTASSIUM: 4.3 meq/L (ref 3.5–5.1)
SODIUM: 138 meq/L (ref 135–145)
TOTAL PROTEIN: 6.9 g/dL (ref 6.0–8.3)
Total Bilirubin: 0.5 mg/dL (ref 0.2–1.2)

## 2015-04-29 LAB — LIPID PANEL
CHOL/HDL RATIO: 2
Cholesterol: 182 mg/dL (ref 0–200)
HDL: 79.1 mg/dL (ref >39.00)
LDL CALC: 86 mg/dL (ref 0–99)
NONHDL: 102.65
Triglycerides: 85 mg/dL (ref 0.0–149.0)
VLDL: 17 mg/dL (ref 0.0–40.0)

## 2015-04-29 LAB — HEMOGLOBIN A1C: HEMOGLOBIN A1C: 5.8 % (ref 4.6–6.5)

## 2015-05-06 ENCOUNTER — Encounter: Payer: Self-pay | Admitting: Primary Care

## 2015-05-06 ENCOUNTER — Ambulatory Visit (INDEPENDENT_AMBULATORY_CARE_PROVIDER_SITE_OTHER): Payer: PPO | Admitting: Primary Care

## 2015-05-06 VITALS — BP 138/86 | HR 68 | Temp 98.1°F | Ht 72.0 in | Wt 253.4 lb

## 2015-05-06 DIAGNOSIS — F32A Depression, unspecified: Secondary | ICD-10-CM | POA: Insufficient documentation

## 2015-05-06 DIAGNOSIS — D179 Benign lipomatous neoplasm, unspecified: Secondary | ICD-10-CM

## 2015-05-06 DIAGNOSIS — Z0000 Encounter for general adult medical examination without abnormal findings: Secondary | ICD-10-CM | POA: Diagnosis not present

## 2015-05-06 DIAGNOSIS — F329 Major depressive disorder, single episode, unspecified: Secondary | ICD-10-CM

## 2015-05-06 DIAGNOSIS — R7309 Other abnormal glucose: Secondary | ICD-10-CM

## 2015-05-06 DIAGNOSIS — E785 Hyperlipidemia, unspecified: Secondary | ICD-10-CM | POA: Diagnosis not present

## 2015-05-06 DIAGNOSIS — K219 Gastro-esophageal reflux disease without esophagitis: Secondary | ICD-10-CM

## 2015-05-06 DIAGNOSIS — J449 Chronic obstructive pulmonary disease, unspecified: Secondary | ICD-10-CM

## 2015-05-06 DIAGNOSIS — I10 Essential (primary) hypertension: Secondary | ICD-10-CM

## 2015-05-06 MED ORDER — SERTRALINE HCL 50 MG PO TABS
50.0000 mg | ORAL_TABLET | Freq: Every day | ORAL | Status: DC
Start: 2015-05-06 — End: 2015-11-06

## 2015-05-06 NOTE — Progress Notes (Signed)
[name], male   DOB: 1946-03-31, 70 y.o.   MRN: [id number]  HPI: Alan [name] is a 70 year old male who presents today for an Annual Wellness Visit.  Past Medical History  Diagnosis Date  . Osteoarthrosis, unspecified whether generalized or localized, lower leg   . Chronic airway obstruction, not elsewhere classified   . Allergic rhinitis, cause unspecified   . Lipoma of unspecified site   . Carpal tunnel syndrome   . Unspecified essential hypertension   . Other and unspecified hyperlipidemia   . Esophageal reflux   . Personal history of colonic polyps   . Personal history of other malignant neoplasm of skin   . Glaucoma (increased eye pressure)     Current Outpatient Prescriptions  Medication Sig Dispense Refill  . amLODipine (NORVASC) 5 MG tablet Take 1 tablet (5 mg total) by mouth daily. 90 tablet 1  . aspirin 81 MG tablet Take 81 mg by mouth daily.      Marland Kitchen atorvastatin (LIPITOR) 40 MG tablet Take 1 tablet (40 mg total) by mouth daily. 90 tablet 1  . doxycycline (VIBRA-TABS) 100 MG tablet Take 1 tablet (100 mg total) by mouth 2 (two) times daily. 14 tablet 0  . fish oil-omega-3 fatty acids 1000 MG capsule Take 2 g by mouth daily.      Marland Kitchen HYDROcodone-homatropine (HYCODAN) 5-1.5 MG/5ML syrup Take 5 mLs by mouth every 8 (eight) hours as needed for cough. 100 mL 0  . lisinopril (PRINIVIL,ZESTRIL) 40 MG tablet Take 1 tablet (40 mg total) by mouth daily. 90 tablet 1  . meloxicam (MOBIC) 15 MG tablet Take 1 tablet (15 mg total) by mouth daily. 30 tablet 3  . Multiple Vitamin (MULTIVITAMIN) tablet Take 1 tablet by mouth daily.      Marland Kitchen omeprazole (PRILOSEC) 20 MG capsule Take 20 mg by mouth daily.      Marland Kitchen triamcinolone cream (KENALOG) 0.1 % Apply 1 application topically 2 (two) times daily. 30 g 0   No current facility-administered medications for this visit.    No Known Allergies  Family History  Problem Relation Age of Onset  . Diabetes Mother   . Hyperlipidemia  Mother   . Hypertension Mother   . Heart attack Mother   . Obesity Mother   . Hyperlipidemia Father   . Hypertension Father   . Lung cancer Father   . Hypertension Sister   . Hyperlipidemia Sister   . Hyperlipidemia Brother   . Hypertension Brother   . Hyperlipidemia Brother   . Hypertension Brother   . Lung cancer Sister   . Hyperlipidemia Sister   . Colon polyps Father     Social History   Social History  . Marital Status: Married    Spouse Name: N/A  . Number of Children: 2  . Years of Education: N/A   Occupational History  . [employer]    Social History Main Topics  . Smoking status: Former Smoker -- 3.00 packs/day for 25 years    Quit date: 11/23/1996  . Smokeless tobacco: Never Used  . Alcohol Use: 0.0 oz/week    0 Standard drinks or equivalent per week     Comment: 4 mixed drinks per week  . Drug Use: No  . Sexual Activity: Not on file   Other Topics Concern  . Not on file   Social History Narrative   Regular exercise -- no  Hospitiliaztions: None  Health Maintenance:    Flu: Completed in October 2016  Tetanus: Completed in 2008  Pneumovax: Completed in 2013  Prevnar: Completed in 2016  Zostavax: Completed in 2008  Colonoscopy: Completed in 2014  Eye Doctor: Completed in January 2017  Dental Exam: Completes annually.   PSA: Completed in 04/2014, normal.    Providers: [name] Friendly, PCP, Dr. Katy Fitch, Optometrist, Dr. Milinda Pointer, [place]   I have personally reviewed and have noted: 1. The patient's medical and social history 2. Their use of alcohol, tobacco or illicit drugs 3. Their current medications and supplements 4. The patient's functional ability including ADL's, fall risks, home safety risks  and hearing or visual impairment. 5. Diet and physical activities 6. Evidence for depression or mood disorder  Subjective:   Review of Systems:   Constitutional: Denies fever, malaise, fatigue, headache or  abrupt weight changes.  HEENT: Denies eye pain, eye redness, ear pain, ringing in the ears, wax buildup, runny nose, nasal congestion, bloody nose, or sore throat. Respiratory: Denies difficulty breathing, shortness of breath, cough or sputum production.   Cardiovascular: Denies chest pain, chest tightness, palpitations or swelling in the hands or feet.  Gastrointestinal: Denies abdominal pain, bloating, constipation, diarrhea or blood in the stool.  GU: Denies urgency, frequency, pain with urination, burning sensation, blood in urine, odor or discharge. Musculoskeletal: Denies decrease in range of motion, difficulty with gait, muscle pain or joint pain and swelling.  Skin: Denies redness, rashes, lesions or ulcercations. Fatty cysts located to body, cyst to right anterior abdomen, now becoming larger and more bothersome.  Neurological: Denies dizziness, difficulty with memory, difficulty with speech or problems with balance and coordination.  Psych: Once managed on Zoloft for depression for years. Came off in Summer of 2016 as he felt improved. Over the past several weeks he's noticed tearfulness when seeing something sad, singing a sad song. Denies sadness, feeling down. He's not lost interest in the things he enjoys doing. No other specific complaints in a complete review of systems (except as listed in HPI above).  Objective:  PE:   There were no vitals taken for this visit. Wt Readings from Last 3 Encounters:  03/04/15 249 lb 12.8 oz (113.309 kg)  01/13/15 245 lb (111.131 kg)  12/03/14 245 lb 12.8 oz (111.494 kg)    General: Appears their stated age, well developed, well nourished in NAD. Skin: Warm, dry and intact. No rashes, lesions or ulcerations noted. HEENT: Head: normal shape and size; Eyes: sclera white, no icterus, conjunctiva pink, PERRLA and EOMs intact; Ears: Tm's gray and intact, normal light reflex; Nose: mucosa pink and moist, septum midline; Throat/Mouth: Teeth present,  mucosa pink and moist, no exudate, lesions or ulcerations noted.  Neck: Normal range of motion. Neck supple, trachea midline. No massses, lumps or thyromegaly present.  Cardiovascular: Normal rate and rhythm. S1,S2 noted.  No murmur, rubs or gallops noted. No JVD or BLE edema. No carotid bruits noted. Pulmonary/Chest: Normal effort and positive vesicular breath sounds. No respiratory distress. No wheezes, rales or ronchi noted.  Abdomen: Soft and nontender. Normal bowel sounds, no bruits noted. No distention or masses noted. Liver, spleen and kidneys non palpable. Musculoskeletal: Normal range of motion. No signs of joint swelling. No difficulty with gait.  Neurological: Alert and oriented. Cranial nerves II-XII intact. Coordination normal. +DTRs bilaterally. Psychiatric: Mood and affect normal. Behavior is normal. Judgment and thought content normal.   EKG:  BMET    Component Value Date/Time   NA  138 04/29/2015 0830   NA 136 01/16/2013 0616   K 4.3 04/29/2015 0830   K 3.5 01/16/2013 0616   CL 103 04/29/2015 0830   CL 105 01/16/2013 0616   CO2 28 04/29/2015 0830   CO2 26 01/16/2013 0616   GLUCOSE 117* 04/29/2015 0830   GLUCOSE 126* 01/16/2013 0616   BUN 25* 04/29/2015 0830   BUN 10 01/16/2013 0616   CREATININE 0.78 04/29/2015 0830   CREATININE 0.69 01/16/2013 0616   CALCIUM 9.1 04/29/2015 0830   CALCIUM 9.0 01/16/2013 0616   GFRNONAA >60 01/16/2013 0616   GFRNONAA 113.43 10/25/2009 0820   GFRAA >60 01/16/2013 0616   GFRAA 147 01/06/2008 1215    Lipid Panel     Component Value Date/Time   CHOL 182 04/29/2015 0830   TRIG 85.0 04/29/2015 0830   HDL 79.10 04/29/2015 0830   CHOLHDL 2 04/29/2015 0830   VLDL 17.0 04/29/2015 0830   LDLCALC 86 04/29/2015 0830    CBC    Component Value Date/Time   WBC 6.2 04/28/2014 0812   WBC 7.5 12/24/2012 1150   RBC 3.96* 04/28/2014 0812   RBC 4.30* 12/24/2012 1150   HGB 12.1* 04/28/2014 0812   HGB 11.2* 01/16/2013 0616   HCT 35.2*  04/28/2014 0812   HCT 39.3* 12/24/2012 1150   PLT 180.0 04/28/2014 0812   PLT 110* 01/16/2013 0616   MCV 88.8 04/28/2014 0812   MCV 91 12/24/2012 1150   MCH 31.7 12/24/2012 1150   MCH 30.5 03/19/2012 2257   MCHC 34.4 04/28/2014 0812   MCHC 34.7 12/24/2012 1150   RDW 13.2 04/28/2014 0812   RDW 12.8 12/24/2012 1150   LYMPHSABS 2.0 04/28/2014 0812   MONOABS 0.5 04/28/2014 0812   EOSABS 0.3 04/28/2014 0812   BASOSABS 0.0 04/28/2014 0812    Hgb A1C Lab Results  Component Value Date   HGBA1C 5.8 04/29/2015      Assessment and Plan:   Medicare Annual Wellness Visit:  Diet: Endorses a fair diet. Breakfast: [place] sausage patty with cheese on bread, egg. Eats out breakfast once weekly. Lunch: Skips Dinner: Chicken breast, salad, pork chops. Snacks: None Desserts: None Beverages: Water, occasional beer  Physical activity: Wasn't exercising, but has started exercising 3-4 times weekly at the gym.  Depression/mood screen: Negative Hearing: Intact to whispered voice Visual acuity: Grossly normal, performs annual eye exam  ADLs: Capable Fall risk: None Home safety: Good Cognitive evaluation: Intact to orientation, naming, recall and repetition EOL planning: Has not completed, full code/ I agree  Preventative Medicine: Immunizations UTD. Colonoscopy UTD. Exam unremarkable. Labs with borderline diabetes. Discussed the importance of a healthy diet and regular exercise in order for weight loss and to reduce risk of other medical diseases.    Next appointment: 6 months for follow up.

## 2015-05-06 NOTE — Patient Instructions (Signed)
Restart Zoloft (sertraline) as discussed. Take 1/2 tablet daily for 6 days, then advance to 1 full tablet thereafter.  Please notify me if the place on your abdomen becomes bothersome, we can have a general surgeon take a look.  It is important that you improve your diet. Please limit carbohydrates in the form of white bread, rice, pasta, fried foods, fast foods, sweet tea, etc. Increase your consumption of fresh fruits and vegetables.  You need to consume about 2 liters of water daily.  Follow up in 6 months for re-evaluation. We will recheck your borderline diabetes test at that time as well.  It was a pleasure to see you today!

## 2015-05-06 NOTE — Assessment & Plan Note (Signed)
-  Stable continue PPI 

## 2015-05-06 NOTE — Assessment & Plan Note (Signed)
Located to anterior right mid abdomen. Present for years. Gradual enlargement, slightly bothersome at times. Feels to be fatty lipoma, discussed options (ultrasound, general surgery consult). He will think about and get back to me on decision.

## 2015-05-06 NOTE — Assessment & Plan Note (Signed)
Immunizations UTD. Colonoscopy UTD. Exam unremarkable. Labs with borderline diabetes. Discussed the importance of a healthy diet and regular exercise in order for weight loss and to reduce risk of other medical diseases.   I have personally reviewed and have noted: 1. The patient's medical and social history 2. Their use of alcohol, tobacco or illicit drugs 3. Their current medications and supplements 4. The patient's functional ability including ADL's, fall  risks, home safety risks and hearing or visual  impairment. 5. Diet and physical activities 6. Evidence for depression or mood disorder  Follow up in 1 year for repeat Wellness Exam.

## 2015-05-06 NOTE — Assessment & Plan Note (Signed)
No complaints. Exam unremarkable.

## 2015-05-06 NOTE — Assessment & Plan Note (Signed)
Stable on current regimen. Continue same. 

## 2015-05-06 NOTE — Assessment & Plan Note (Signed)
Long standing history of in past. Came off Zoloft in late summer/early fall as he felt better. He would like to go back on as he felt less tearful overall.  Will re-start trial for 3 months and re-evaluate. Doesn't seem clinically depressed today. PHQ 2 score of 1. Denies SI/HI. Will continue to monitor.

## 2015-05-06 NOTE — Assessment & Plan Note (Signed)
Stable on atorvastatin. Continue same.

## 2015-05-06 NOTE — Assessment & Plan Note (Signed)
Back up to 5.8. Endorses poor diet over the holidays and not exercising. He's started exercising again and plans on improving his diet. Discussed the importance of a healthy diet and regular exercise in order for weight loss and to reduce risk of other medical diseases. Will recheck in 6 months.

## 2015-05-06 NOTE — Progress Notes (Signed)
Pre visit review using our clinic review tool, if applicable. No additional management support is needed unless otherwise documented below in the visit note. 

## 2015-05-31 ENCOUNTER — Ambulatory Visit: Payer: PPO | Admitting: Podiatry

## 2015-06-16 ENCOUNTER — Telehealth: Payer: Self-pay | Admitting: *Deleted

## 2015-06-16 MED ORDER — MELOXICAM 15 MG PO TABS: 15.0000 mg | ORAL_TABLET | Freq: Every day | ORAL | Status: DC

## 2015-06-16 NOTE — Telephone Encounter (Signed)
Refill request for Meloxicam 15mg .  Dr. Milinda Pointer states refill once as previously ordered.  Return fax.

## 2015-06-20 ENCOUNTER — Ambulatory Visit (INDEPENDENT_AMBULATORY_CARE_PROVIDER_SITE_OTHER): Payer: PPO | Admitting: Primary Care

## 2015-06-20 ENCOUNTER — Encounter: Payer: Self-pay | Admitting: Primary Care

## 2015-06-20 VITALS — BP 138/82 | HR 68 | Temp 97.9°F | Ht 72.0 in | Wt 255.2 lb

## 2015-06-20 DIAGNOSIS — Z85828 Personal history of other malignant neoplasm of skin: Secondary | ICD-10-CM

## 2015-06-20 NOTE — Progress Notes (Signed)
Subjective:    [name], male    DOB: [birth year], 70 y.o.   MRN: [id number]  HPI  [name] is a 70 year old male who presents today with a chief complaint of abnormal skin mass. His mass is located to his posterior back and has noticed for 1 week. He's been in the sun consistently at [facility] for the past several weeks. His rash is itchy in nature, denies pain, changes in side/shape/color. He's been applying triamcinolone cream 0.1% with improvement in itching. He's been wearing sun screen with an SPF of 30 while in the sun, and endorses re-application. He's had several "suspicous" spots removed to his back in the past.   Review of Systems  Skin: Negative for rash and wound.       Abnormal skin mass       Past Medical History  Diagnosis Date  . Osteoarthrosis, unspecified whether generalized or localized, lower leg   . Chronic airway obstruction, not elsewhere classified   . Allergic rhinitis, cause unspecified   . Lipoma of unspecified site   . Carpal tunnel syndrome   . Unspecified essential hypertension   . Other and unspecified hyperlipidemia   . Esophageal reflux   . Personal history of colonic polyps   . Personal history of other malignant neoplasm of skin   . Glaucoma (increased eye pressure)     Social History   Social History  . Marital Status: Married    Spouse Name: N/A  . Number of Children: 2  . Years of Education: N/A   Occupational History  . [employer]    Social History Main Topics  . Smoking status: Former Smoker -- 3.00 packs/day for 25 years    Quit date: 11/23/1996  . Smokeless tobacco: Never Used  . Alcohol Use: 0.0 oz/week    0 Standard drinks or equivalent per week     Comment: 4 mixed drinks per week  . Drug Use: No  . Sexual Activity: Not on file   Other Topics Concern  . Not on file   Social History Narrative   Regular exercise -- no             Past Surgical History  Procedure  Laterality Date  . Total knee arthroplasty      03-2009 [facility]  . Bone spur  06-2003    right thumb  . Shoulder surgery  03-24-2007    removal bone spur  . Knee arthroscopy  09-2007    partial medial mesicecotmy, patellar chonfroplasty  . 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 Diabetes Mother   . Hyperlipidemia Mother   . Hypertension Mother   . Heart attack Mother   . Obesity Mother   . Hyperlipidemia Father   . Hypertension Father   . Lung cancer Father   . Hypertension Sister   . Hyperlipidemia Sister   . Hyperlipidemia Brother   . Hypertension Brother   . Hyperlipidemia Brother   . Hypertension Brother   . Lung cancer Sister   . Hyperlipidemia Sister   . Colon polyps Father     No Known Allergies  Current Outpatient Prescriptions on File Prior to Visit  Medication Sig Dispense Refill  . amLODipine (NORVASC) 5 MG tablet Take 1 tablet (5 mg total) by mouth daily. 90 tablet 1  . aspirin 81 MG tablet Take 81 mg by mouth  daily.      . atorvastatin (LIPITOR) 40 MG tablet Take 1 tablet (40 mg total) by mouth daily. 90 tablet 1  . fish oil-omega-3 fatty acids 1000 MG capsule Take 2 g by mouth daily.      Marland Kitchen lisinopril (PRINIVIL,ZESTRIL) 40 MG tablet Take 1 tablet (40 mg total) by mouth daily. 90 tablet 1  . meloxicam (MOBIC) 15 MG tablet Take 1 tablet (15 mg total) by mouth daily. 30 tablet 0  . Multiple Vitamin (MULTIVITAMIN) tablet Take 1 tablet by mouth daily.      Marland Kitchen omeprazole (PRILOSEC) 20 MG capsule Take 20 mg by mouth daily.      . sertraline (ZOLOFT) 50 MG tablet Take 1 tablet (50 mg total) by mouth daily. 90 tablet 1  . triamcinolone cream (KENALOG) 0.1 % Apply 1 application topically 2 (two) times daily. 30 g 0   No current facility-administered medications on file prior to visit.    BP 138/82 mmHg  Pulse 68  Temp(Src) 97.9 F (36.6 C) (Oral)  Ht 6' (1.829 m)  Wt 255 lb 3.2 oz (115.758 kg)  BMI  34.60 kg/m2  SpO2 98%    Objective:   Physical Exam  Constitutional: He appears well-nourished.  Cardiovascular: Normal rate.   Pulmonary/Chest: Effort normal.  Skin: Skin is warm and dry.  1 cm rounded, raised, skin mass to left side of upper posterior chest wall. Slightly red.           Assessment & Plan:

## 2015-06-20 NOTE — Assessment & Plan Note (Signed)
Abnormal skin mass to posterior chest wall. Does appear slightly suspicious and given history should be removed. He will schedule an appointment for removal in the future. Will remove and send off for biopsy.

## 2015-06-20 NOTE — Patient Instructions (Signed)
Schedule an appointment for removal of the spot on your back. Please schedule a 45 minute appointment for this.   Continue to apply the triamcinolone cream twice daily as needed for itching.  Ensure that you are applying and re-applying sun screen for protection.  It was a pleasure to see you today!

## 2015-06-20 NOTE — Progress Notes (Signed)
Pre visit review using our clinic review tool, if applicable. No additional management support is needed unless otherwise documented below in the visit note. 

## 2015-06-23 ENCOUNTER — Encounter: Payer: Self-pay | Admitting: Primary Care

## 2015-06-23 ENCOUNTER — Ambulatory Visit (INDEPENDENT_AMBULATORY_CARE_PROVIDER_SITE_OTHER): Payer: PPO | Admitting: Primary Care

## 2015-06-23 VITALS — BP 132/78 | HR 72 | Temp 98.0°F | Wt 258.5 lb

## 2015-06-23 DIAGNOSIS — C44519 Basal cell carcinoma of skin of other part of trunk: Secondary | ICD-10-CM

## 2015-06-23 DIAGNOSIS — L918 Other hypertrophic disorders of the skin: Secondary | ICD-10-CM | POA: Diagnosis not present

## 2015-06-23 DIAGNOSIS — D229 Melanocytic nevi, unspecified: Secondary | ICD-10-CM

## 2015-06-23 NOTE — Patient Instructions (Signed)
Keep areas covered and apply neosporin once daily for the next 3-4 days.  I will call you once I receive your biopsy results.  It was a pleasure to see you today!

## 2015-06-23 NOTE — Progress Notes (Signed)
Pre visit review using our clinic review tool, if applicable. No additional management support is needed unless otherwise documented below in the visit note. 

## 2015-06-23 NOTE — Progress Notes (Signed)
Subjective:    [name], male    DOB: 04/19/1945, 70 y.o.   MRN: [id number]  HPI  Alan [name] is a 70 year old male who presents today for removal of abnormal skin mass. He has a history of melanoma with removal of numerous nevi/abnormalities in the past. His abnormal mass is located to the left upper posterior chest wall and has noticed for nearly 1 month. He does spend a lot of time outdoors and at the beach without his shirt, especially in the summer. He does endorse wearing sun screen with re-application. He's also requesting removal of several skin tags to the right neck and right lateral chest wall.   Review of Systems  Skin:       Abnormal mass to posterior chest wall. 4 skin tags to be removed.       Past Medical History  Diagnosis Date  . Osteoarthrosis, unspecified whether generalized or localized, lower leg   . Chronic airway obstruction, not elsewhere classified   . Allergic rhinitis, cause unspecified   . Lipoma of unspecified site   . Carpal tunnel syndrome   . Unspecified essential hypertension   . Other and unspecified hyperlipidemia   . Esophageal reflux   . Personal history of colonic polyps   . Personal history of other malignant neoplasm of skin   . Glaucoma (increased eye pressure)     Social History   Social History  . Marital Status: Married    Spouse Name: N/A  . Number of Children: 2  . Years of Education: N/A   Occupational History  . [employer]    Social History Main Topics  . Smoking status: Former Smoker -- 3.00 packs/day for 25 years    Quit date: 11/23/1996  . Smokeless tobacco: Never Used  . Alcohol Use: 0.0 oz/week    0 Standard drinks or equivalent per week     Comment: 4 mixed drinks per week  . Drug Use: No  . Sexual Activity: Not on file   Other Topics Concern  . Not on file   Social History Narrative   Regular exercise -- no             Past Surgical History  Procedure  Laterality Date  . Total knee arthroplasty      03-2009 [facility]  . Bone spur  06-2003    right thumb  . Shoulder surgery  03-24-2007    removal bone spur  . Knee arthroscopy  09-2007    partial medial mesicecotmy, patellar chonfroplasty  . 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 Diabetes Mother   . Hyperlipidemia Mother   . Hypertension Mother   . Heart attack Mother   . Obesity Mother   . Hyperlipidemia Father   . Hypertension Father   . Lung cancer Father   . Hypertension Sister   . Hyperlipidemia Sister   . Hyperlipidemia Brother   . Hypertension Brother   . Hyperlipidemia Brother   . Hypertension Brother   . Lung cancer Sister   . Hyperlipidemia Sister   . Colon polyps Father     No Known Allergies  Current Outpatient Prescriptions on File Prior to Visit  Medication Sig Dispense Refill  . amLODipine (NORVASC) 5 MG tablet Take 1 tablet (5 mg total) by mouth daily. 90 tablet 1  . aspirin 81 MG tablet Take 81 mg by  mouth daily.      Marland Kitchen atorvastatin (LIPITOR) 40 MG tablet Take 1 tablet (40 mg total) by mouth daily. 90 tablet 1  . fish oil-omega-3 fatty acids 1000 MG capsule Take 2 g by mouth daily.      Marland Kitchen lisinopril (PRINIVIL,ZESTRIL) 40 MG tablet Take 1 tablet (40 mg total) by mouth daily. 90 tablet 1  . meloxicam (MOBIC) 15 MG tablet Take 1 tablet (15 mg total) by mouth daily. 30 tablet 0  . Multiple Vitamin (MULTIVITAMIN) tablet Take 1 tablet by mouth daily.      Marland Kitchen omeprazole (PRILOSEC) 20 MG capsule Take 20 mg by mouth daily.      . sertraline (ZOLOFT) 50 MG tablet Take 1 tablet (50 mg total) by mouth daily. 90 tablet 1  . triamcinolone cream (KENALOG) 0.1 % Apply 1 application topically 2 (two) times daily. 30 g 0   No current facility-administered medications on file prior to visit.    BP 132/78 mmHg  Pulse 72  Temp(Src) 98 F (36.7 C) (Oral)  Wt 258 lb 8 oz (117.255 kg)    Objective:   Physical  Exam  Constitutional: He appears well-nourished.  Cardiovascular: Normal rate.   Pulmonary/Chest: Effort normal.  Skin: Skin is warm and dry.  1 cm abnormal mass to left upper posterior chest wall. Mildly red. Suspicious.   1 small skin tag to right lateral chest wall. 3 small skin tags to right lateral neck.          Assessment & Plan:  Skin mass and Skin Tag Removal:  Consent signed and witnessed with Arnetha Courser, CMA. Site prepped with betadine.  Local anesthetic with lidocaine 1% with epi injected to surrounding mass to posterior chest wall using sterile technique. Once patient experienced lack of sensation to area, mass shaved using Dermablade with sterile technique. Specimen placed into biopsy container and sent out for pathology. Site was cauterized to stop bleeding.  Dressing with bacitracin applied.  Instructions provided to patient regarding home care.  Skin Tag Removal:  Consent signed and witnessed with Arnetha Courser, CMA. Site prepped with betadine. Local anesthetic with Pain Ease applied. 1 skin tag removed to right lateral chest wall with 11 blade and forceps. 3 small skin tags removed from right lateral neck line with 11 blade and forceps. Silver nitrate sticks used to stop bleeding. Dressing with bacitracin applied.  Instructions provided to patient regarding home care.

## 2015-06-24 ENCOUNTER — Other Ambulatory Visit: Payer: Self-pay | Admitting: Primary Care

## 2015-06-24 DIAGNOSIS — C4491 Basal cell carcinoma of skin, unspecified: Secondary | ICD-10-CM

## 2015-07-08 DIAGNOSIS — C44519 Basal cell carcinoma of skin of other part of trunk: Secondary | ICD-10-CM | POA: Diagnosis not present

## 2015-09-06 DIAGNOSIS — Z85828 Personal history of other malignant neoplasm of skin: Secondary | ICD-10-CM | POA: Diagnosis not present

## 2015-09-06 DIAGNOSIS — D225 Melanocytic nevi of trunk: Secondary | ICD-10-CM | POA: Diagnosis not present

## 2015-09-06 DIAGNOSIS — L814 Other melanin hyperpigmentation: Secondary | ICD-10-CM | POA: Diagnosis not present

## 2015-09-06 DIAGNOSIS — L821 Other seborrheic keratosis: Secondary | ICD-10-CM | POA: Diagnosis not present

## 2015-09-06 DIAGNOSIS — D1801 Hemangioma of skin and subcutaneous tissue: Secondary | ICD-10-CM | POA: Diagnosis not present

## 2015-10-24 DIAGNOSIS — H25013 Cortical age-related cataract, bilateral: Secondary | ICD-10-CM | POA: Diagnosis not present

## 2015-10-24 DIAGNOSIS — H40013 Open angle with borderline findings, low risk, bilateral: Secondary | ICD-10-CM | POA: Diagnosis not present

## 2015-11-01 ENCOUNTER — Encounter: Payer: Self-pay | Admitting: Podiatry

## 2015-11-01 ENCOUNTER — Ambulatory Visit (INDEPENDENT_AMBULATORY_CARE_PROVIDER_SITE_OTHER): Payer: PPO | Admitting: Podiatry

## 2015-11-01 ENCOUNTER — Ambulatory Visit (INDEPENDENT_AMBULATORY_CARE_PROVIDER_SITE_OTHER): Payer: PPO

## 2015-11-01 VITALS — BP 142/60 | HR 84 | Resp 12

## 2015-11-01 DIAGNOSIS — M7752 Other enthesopathy of left foot: Secondary | ICD-10-CM | POA: Diagnosis not present

## 2015-11-01 DIAGNOSIS — R52 Pain, unspecified: Secondary | ICD-10-CM

## 2015-11-01 DIAGNOSIS — M778 Other enthesopathies, not elsewhere classified: Secondary | ICD-10-CM

## 2015-11-01 DIAGNOSIS — M779 Enthesopathy, unspecified: Secondary | ICD-10-CM

## 2015-11-01 NOTE — Progress Notes (Signed)
He presents today with a chief complaint of left ankle joint this and hurting him for about a month. He states that he is in a lot of dancing lately and he and his girlfriend have been exercising vigorously. He denies any trauma or injury to the foot or ankle.  Objective: Vital signs are stable he is alert and oriented 3. He has pain on palpation of the anterior ankle and the extensor retinaculum. There is no crepitation. He also has pain on palpation of the sinus tarsi of the left foot with pain on eversion of the subtalar joint. Otherwise pain and pulses are stable. No open lesions or wounds. Radiographs taken today do not demonstrate any type of osseus abnormalities in the area. 3 views of the left foot and ankle were taken.  Assessment: Subtalar joint capsulitis left. Extensor tendinitis left.  Plan: I injected the anterior ankle today but not into the ankle joint with dexamethasone and local anesthetic. Also injected the subtalar joint with Kenalog and local anesthetic. I'll place him on anti-inflammatories and follow-up with him in 1 month.

## 2015-11-06 ENCOUNTER — Other Ambulatory Visit: Payer: Self-pay | Admitting: Primary Care

## 2015-11-06 DIAGNOSIS — E785 Hyperlipidemia, unspecified: Secondary | ICD-10-CM

## 2015-11-06 DIAGNOSIS — F32A Depression, unspecified: Secondary | ICD-10-CM

## 2015-11-06 DIAGNOSIS — I10 Essential (primary) hypertension: Secondary | ICD-10-CM

## 2015-11-06 DIAGNOSIS — F329 Major depressive disorder, single episode, unspecified: Secondary | ICD-10-CM

## 2015-11-07 ENCOUNTER — Ambulatory Visit: Payer: PPO | Admitting: Family Medicine

## 2015-11-29 ENCOUNTER — Ambulatory Visit: Payer: PPO | Admitting: Podiatry

## 2015-12-01 ENCOUNTER — Telehealth: Payer: Self-pay | Admitting: Primary Care

## 2015-12-01 ENCOUNTER — Encounter: Payer: Self-pay | Admitting: *Deleted

## 2015-12-01 ENCOUNTER — Other Ambulatory Visit: Payer: Self-pay | Admitting: Primary Care

## 2015-12-01 ENCOUNTER — Ambulatory Visit (INDEPENDENT_AMBULATORY_CARE_PROVIDER_SITE_OTHER): Payer: PPO | Admitting: Podiatry

## 2015-12-01 ENCOUNTER — Encounter: Payer: Self-pay | Admitting: Podiatry

## 2015-12-01 VITALS — BP 135/69 | HR 98 | Resp 16

## 2015-12-01 DIAGNOSIS — M7752 Other enthesopathy of left foot: Secondary | ICD-10-CM

## 2015-12-01 DIAGNOSIS — M778 Other enthesopathies, not elsewhere classified: Secondary | ICD-10-CM

## 2015-12-01 DIAGNOSIS — M779 Enthesopathy, unspecified: Secondary | ICD-10-CM

## 2015-12-01 DIAGNOSIS — Z1159 Encounter for screening for other viral diseases: Secondary | ICD-10-CM

## 2015-12-01 NOTE — Telephone Encounter (Signed)
-----   Message from Clancy Gourd, [place] sent at 12/01/2015 12:50 PM EDT ----- Regarding: Hep C Screening [name], Alan [name] came by to schedule his flu and a hep C screening. I scheduled him for the flu but he needs an order for the other. Let me know what kind of appointment I should schedule for him. Thanks, [employer]

## 2015-12-01 NOTE — Progress Notes (Signed)
He presents today for follow-up of his subtalar joint capsulitis stating that is approximately 80% improved.  Objective: Vital signs are stable he is alert and oriented 3 has mild tenderness on inversion against resistance subtalar joint left foot.  Assessment: Subtalar joint capsulitis left.  Plan: I injected the area today with Kenalog and local anesthetic. Follow-up with me in 1 month if necessary.

## 2015-12-01 NOTE — Telephone Encounter (Signed)
Please schedule Mr. Alan [name] for a lab only appointment for Hep C testing. Thanks!

## 2015-12-02 ENCOUNTER — Ambulatory Visit (INDEPENDENT_AMBULATORY_CARE_PROVIDER_SITE_OTHER): Payer: PPO

## 2015-12-02 ENCOUNTER — Other Ambulatory Visit (INDEPENDENT_AMBULATORY_CARE_PROVIDER_SITE_OTHER): Payer: PPO

## 2015-12-02 DIAGNOSIS — Z23 Encounter for immunization: Secondary | ICD-10-CM

## 2015-12-02 DIAGNOSIS — Z1159 Encounter for screening for other viral diseases: Secondary | ICD-10-CM

## 2015-12-02 LAB — HEPATITIS C ANTIBODY: HCV AB: NEGATIVE

## 2016-01-10 ENCOUNTER — Ambulatory Visit (INDEPENDENT_AMBULATORY_CARE_PROVIDER_SITE_OTHER): Payer: PPO | Admitting: Primary Care

## 2016-01-10 ENCOUNTER — Encounter: Payer: Self-pay | Admitting: Primary Care

## 2016-01-10 VITALS — BP 144/86 | HR 87 | Temp 98.1°F | Wt 256.0 lb

## 2016-01-10 DIAGNOSIS — E669 Obesity, unspecified: Secondary | ICD-10-CM

## 2016-01-10 DIAGNOSIS — E66811 Obesity, class 1: Secondary | ICD-10-CM

## 2016-01-10 DIAGNOSIS — R635 Abnormal weight gain: Secondary | ICD-10-CM

## 2016-01-10 DIAGNOSIS — R7309 Other abnormal glucose: Secondary | ICD-10-CM | POA: Diagnosis not present

## 2016-01-10 LAB — COMPREHENSIVE METABOLIC PANEL
ALT: 25 U/L (ref 0–53)
AST: 18 U/L (ref 0–37)
Albumin: 4.1 g/dL (ref 3.5–5.2)
Alkaline Phosphatase: 49 U/L (ref 39–117)
BILIRUBIN TOTAL: 0.5 mg/dL (ref 0.2–1.2)
BUN: 21 mg/dL (ref 6–23)
CALCIUM: 9.6 mg/dL (ref 8.4–10.5)
CO2: 28 meq/L (ref 19–32)
Chloride: 103 mEq/L (ref 96–112)
Creatinine, Ser: 0.9 mg/dL (ref 0.40–1.50)
GFR: 88.79 mL/min (ref >60.00)
Glucose, Bld: 118 mg/dL — ABNORMAL HIGH (ref 70–99)
Potassium: 4.9 mEq/L (ref 3.5–5.1)
Sodium: 141 mEq/L (ref 135–145)
Total Protein: 7.1 g/dL (ref 6.0–8.3)

## 2016-01-10 LAB — TSH: TSH: 1.5 u[IU]/mL (ref 0.35–4.50)

## 2016-01-10 LAB — HEMOGLOBIN A1C: Hgb A1c MFr Bld: 5.6 % (ref 4.6–6.5)

## 2016-01-10 NOTE — Assessment & Plan Note (Signed)
Weight loss of 2 pounds since last visit. No alarm signs for HF, thyroid disorder, abdominal ascites. Suspect obesity from excess calorie consumption. Encouraged him to continue regular exercise, limit foods and drinks with empty calories. Handout and education provided. Will check A1C, TSH, CMP to rule out other causes.

## 2016-01-10 NOTE — Progress Notes (Signed)
Subjective:    Patient ID: NUH MEEDS, male    DOB: [birth year], 70 y.o.   MRN: [id number]  HPI  Alan [name] is a 70 year old male with a history of essential hypertension, hyperlipidemia, and obesity who presents today with a chief complaint of weight gain. He's been exercising at the gym three times weekly for about 45 minutes in duration for the past 6 months, mostly cardio. He denies shortness of breath, lower extremity edema, cold intolerance, palpitations, fatigue.   He endorses a healthy diet which consists of: Breakfast: [place] patty, cheese, toast with mayo, coffee Lunch: Skips Dinner: Grilled chicken breast, baked fish, salads, vegetables  Snacks: None Desserts: Occasionally Beverages: 4 beers daily, water (2 cups), low calorie sweet tea  Wt Readings from Last 3 Encounters:  01/10/16 256 lb (116.1 kg)  06/23/15 258 lb 8 oz (117.3 kg)  06/20/15 255 lb 3.2 oz (115.8 kg)     Review of Systems  Constitutional: Negative for fatigue and unexpected weight change.  Respiratory: Negative for shortness of breath.   Cardiovascular: Negative for chest pain, palpitations and leg swelling.  Gastrointestinal: Negative for abdominal distention and nausea.  Endocrine: Negative for cold intolerance.  Neurological: Negative for weakness.       Past Medical History:  Diagnosis Date  . Allergic rhinitis, cause unspecified   . Carpal tunnel syndrome   . Chronic airway obstruction, not elsewhere classified   . Esophageal reflux   . Glaucoma (increased eye pressure)   . Lipoma of unspecified site   . Osteoarthrosis, unspecified whether generalized or localized, lower leg   . Other and unspecified hyperlipidemia   . Personal history of colonic polyps   . Personal history of other malignant neoplasm of skin   . Unspecified essential hypertension      Social History   Social History  . Marital status: Married    Spouse name: N/A  . Number of children: 2  . Years of  education: N/A   Occupational History  . [employer]    Social History Main Topics  . Smoking status: Former Smoker    Packs/day: 3.00    Years: 25.00    Quit date: 11/23/1996  . Smokeless tobacco: Never Used  . Alcohol use 0.0 oz/week     Comment: 4 mixed drinks per week  . Drug use: No  . Sexual activity: Not on file   Other Topics Concern  . Not on file   Social History Narrative   Regular exercise -- no             Past Surgical History:  Procedure Laterality Date  . bone spur  06-2003   right thumb  . KNEE ARTHROSCOPY  09-2007   partial medial mesicecotmy, patellar chonfroplasty  . SHOULDER SURGERY  03-24-2007   removal bone spur  . TOTAL KNEE ARTHROPLASTY     03-2009 [facility]  . TOTAL KNEE ARTHROPLASTY Left 12/2012   [facility]    Family History  Problem Relation Age of Onset  . Diabetes Mother   . Hyperlipidemia Mother   . Hypertension Mother   . Heart attack Mother   . Obesity Mother   . Hyperlipidemia Father   . Hypertension Father   . Lung cancer Father   . Colon polyps Father   . Hyperlipidemia Brother   . Hypertension Brother   . Hyperlipidemia Brother   . Hypertension Brother   . Hypertension Sister   . Hyperlipidemia Sister   .  Lung cancer Sister   . Hyperlipidemia Sister     No Known Allergies  Current Outpatient Prescriptions on File Prior to Visit  Medication Sig Dispense Refill  . amLODipine (NORVASC) 5 MG tablet TAKE 1 TABLET BY MOUTH DAILY 90 tablet 3  . aspirin 81 MG tablet Take 81 mg by mouth daily.      Marland Kitchen atorvastatin (LIPITOR) 40 MG tablet TAKE 1 TABLET BY MOUTH DAILY 90 tablet 3  . fish oil-omega-3 fatty acids 1000 MG capsule Take 2 g by mouth daily.      Marland Kitchen lisinopril (PRINIVIL,ZESTRIL) 40 MG tablet TAKE 1 TABLET BY MOUTH DAILY 90 tablet 3  . meloxicam (MOBIC) 15 MG tablet Take 1 tablet (15 mg total) by mouth daily. 30 tablet 0  . Multiple Vitamin (MULTIVITAMIN) tablet Take 1 tablet by mouth daily.      Marland Kitchen  omeprazole (PRILOSEC) 20 MG capsule Take 20 mg by mouth daily.      . sertraline (ZOLOFT) 50 MG tablet TAKE 1 TABLET BY MOUTH DAILY 90 tablet 3  . triamcinolone cream (KENALOG) 0.1 % Apply 1 application topically 2 (two) times daily. 30 g 0   No current facility-administered medications on file prior to visit.     BP (!) 144/86   Pulse 87   Temp 98.1 F (36.7 C) (Oral)   Wt 256 lb (116.1 kg)   SpO2 97%   BMI 34.72 kg/m    Objective:   Physical Exam  Constitutional: He appears well-nourished.  Neck: Neck supple.  Cardiovascular: Normal rate, regular rhythm and normal heart sounds.   No murmur heard. No lower extremity edema.  Pulmonary/Chest: Effort normal and breath sounds normal.  Abdominal: Soft. He exhibits no distension.  Skin: Skin is warm.          Assessment & Plan:

## 2016-01-10 NOTE — Patient Instructions (Signed)
It's importance to improve your diet by reducing consumption of cheese, mayonnaise, excessive salad toppings, processed snack foods, alcohol. Increase consumption of fresh vegetables and fruits, whole grains, water.  Ensure you are drinking 64 ounces of water daily.  Limit alcohol consumption to the weekend, or three days weekly.  Continue exercising. You should be getting 150 minutes of moderate intensity exercise weekly.  Consider calorie counting for weight loss. Someone your height should aim to consume 2000 calories daily.   Weigh yourself weekly, not daily as this is a better indicator of your overall weight loss efforts.  Complete lab work prior to leaving today. I will notify you of your results once received.   It was a pleasure to see you today!  Calorie Counting for Weight Loss Calories are energy you get from the things you eat and drink. Your body uses this energy to keep you going throughout the day. The number of calories you eat affects your weight. When you eat more calories than your body needs, your body stores the extra calories as fat. When you eat fewer calories than your body needs, your body burns fat to get the energy it needs. Calorie counting means keeping track of how many calories you eat and drink each day. If you make sure to eat fewer calories than your body needs, you should lose weight. In order for calorie counting to work, you will need to eat the number of calories that are right for you in a day to lose a healthy amount of weight per week. A healthy amount of weight to lose per week is usually 1-2 lb (0.5-0.9 kg). A dietitian can determine how many calories you need in a day and give you suggestions on how to reach your calorie goal.  WHAT IS MY MY PLAN? My goal is to have __________ calories per day.  If I have this many calories per day, I should lose around __________ pounds per week. WHAT DO I NEED TO KNOW ABOUT CALORIE COUNTING? In order to meet your  daily calorie goal, you will need to:  Find out how many calories are in each food you would like to eat. Try to do this before you eat.  Decide how much of the food you can eat.  Write down what you ate and how many calories it had. Doing this is called keeping a food log. WHERE DO I FIND CALORIE INFORMATION? The number of calories in a food can be found on a Nutrition Facts label. Note that all the information on a label is based on a specific serving of the food. If a food does not have a Nutrition Facts label, try to look up the calories online or ask your dietitian for help. HOW DO I DECIDE HOW MUCH TO EAT? To decide how much of the food you can eat, you will need to consider both the number of calories in one serving and the size of one serving. This information can be found on the Nutrition Facts label. If a food does not have a Nutrition Facts label, look up the information online or ask your dietitian for help. Remember that calories are listed per serving. If you choose to have more than one serving of a food, you will have to multiply the calories per serving by the amount of servings you plan to eat. For example, the label on a package of bread might say that a serving size is 1 slice and that there are 90 calories  in a serving. If you eat 1 slice, you will have eaten 90 calories. If you eat 2 slices, you will have eaten 180 calories. HOW DO I KEEP A FOOD LOG? After each meal, record the following information in your food log:  What you ate.  How much of it you ate.  How many calories it had.  Then, add up your calories. Keep your food log near you, such as in a small notebook in your pocket. Another option is to use a mobile app or website. Some programs will calculate calories for you and show you how many calories you have left each time you add an item to the log. WHAT ARE SOME CALORIE COUNTING TIPS?  Use your calories on foods and drinks that will fill you up and not leave  you hungry. Some examples of this include foods like nuts and nut butters, vegetables, lean proteins, and high-fiber foods (more than 5 g fiber per serving).  Eat nutritious foods and avoid empty calories. Empty calories are calories you get from foods or beverages that do not have many nutrients, such as candy and soda. It is better to have a nutritious high-calorie food (such as an avocado) than a food with few nutrients (such as a bag of chips).  Know how many calories are in the foods you eat most often. This way, you do not have to look up how many calories they have each time you eat them.  Look out for foods that may seem like low-calorie foods but are really high-calorie foods, such as baked goods, soda, and fat-free candy.  Pay attention to calories in drinks. Drinks such as sodas, specialty coffee drinks, alcohol, and juices have a lot of calories yet do not fill you up. Choose low-calorie drinks like water and diet drinks.  Focus your calorie counting efforts on higher calorie items. Logging the calories in a garden salad that contains only vegetables is less important than calculating the calories in a milk shake.  Find a way of tracking calories that works for you. Get creative. Most people who are successful find ways to keep track of how much they eat in a day, even if they do not count every calorie. WHAT ARE SOME PORTION CONTROL TIPS?  Know how many calories are in a serving. This will help you know how many servings of a certain food you can have.  Use a measuring cup to measure serving sizes. This is helpful when you start out. With time, you will be able to estimate serving sizes for some foods.  Take some time to put servings of different foods on your favorite plates, bowls, and cups so you know what a serving looks like.  Try not to eat straight from a bag or box. Doing this can lead to overeating. Put the amount you would like to eat in a cup or on a plate to make sure  you are eating the right portion.  Use smaller plates, glasses, and bowls to prevent overeating. This is a quick and easy way to practice portion control. If your plate is smaller, less food can fit on it.  Try not to multitask while eating, such as watching TV or using your computer. If it is time to eat, sit down at a table and enjoy your food. Doing this will help you to start recognizing when you are full. It will also make you more aware of what and how much you are eating. HOW CAN I  CALORIE COUNT WHEN EATING OUT?  Ask for smaller portion sizes or child-sized portions.  Consider sharing an entree and sides instead of getting your own entree.  If you get your own entree, eat only half. Ask for a box at the beginning of your meal and put the rest of your entree in it so you are not tempted to eat it.  Look for the calories on the menu. If calories are listed, choose the lower calorie options.  Choose dishes that include vegetables, fruits, whole grains, low-fat dairy products, and lean protein. Focusing on smart food choices from each of the 5 food groups can help you stay on track at restaurants.  Choose items that are boiled, broiled, grilled, or steamed.  Choose water, milk, unsweetened iced tea, or other drinks without added sugars. If you want an alcoholic beverage, choose a lower calorie option. For example, a regular margarita can have up to 700 calories and a glass of wine has around 150.  Stay away from items that are buttered, battered, fried, or served with cream sauce. Items labeled "crispy" are usually fried, unless stated otherwise.  Ask for dressings, sauces, and syrups on the side. These are usually very high in calories, so do not eat much of them.  Watch out for salads. Many people think salads are a healthy option, but this is often not the case. Many salads come with bacon, fried chicken, lots of cheese, fried chips, and dressing. All of these items have a lot of  calories. If you want a salad, choose a garden salad and ask for grilled meats or steak. Ask for the dressing on the side, or ask for olive oil and vinegar or lemon to use as dressing.  Estimate how many servings of a food you are given. For example, a serving of cooked rice is  cup or about the size of half a tennis ball or one cupcake wrapper. Knowing serving sizes will help you be aware of how much food you are eating at restaurants. The list below tells you how big or small some common portion sizes are based on everyday objects.  1 oz--4 stacked dice.  3 oz--1 deck of cards.  1 tsp--1 dice.  1 Tbsp-- a Ping-Pong ball.  2 Tbsp--1 Ping-Pong ball.   cup--1 tennis ball or 1 cupcake wrapper.  1 cup--1 baseball.   This information is not intended to replace advice given to you by your health care provider. Make sure you discuss any questions you have with your health care provider.   Document Released: 03/19/2005 Document Revised: 04/09/2014 Document Reviewed: 01/22/2013 Elsevier Interactive Patient Education [employer].

## 2016-01-10 NOTE — Assessment & Plan Note (Signed)
Due for recheck today 

## 2016-01-10 NOTE — Progress Notes (Signed)
Pre visit review using our clinic review tool, if applicable. No additional management support is needed unless otherwise documented below in the visit note. 

## 2016-01-12 ENCOUNTER — Ambulatory Visit: Payer: PPO | Admitting: Podiatry

## 2016-03-30 ENCOUNTER — Encounter: Payer: Self-pay | Admitting: Family Medicine

## 2016-03-30 ENCOUNTER — Ambulatory Visit (INDEPENDENT_AMBULATORY_CARE_PROVIDER_SITE_OTHER): Payer: PPO | Admitting: Family Medicine

## 2016-03-30 VITALS — BP 140/78 | HR 83 | Temp 97.9°F | Wt 257.0 lb

## 2016-03-30 DIAGNOSIS — J441 Chronic obstructive pulmonary disease with (acute) exacerbation: Secondary | ICD-10-CM | POA: Diagnosis not present

## 2016-03-30 MED ORDER — ALBUTEROL SULFATE HFA 108 (90 BASE) MCG/ACT IN AERS: 2.0000 | INHALATION_SPRAY | RESPIRATORY_TRACT | 1 refills | Status: DC | PRN

## 2016-03-30 MED ORDER — IPRATROPIUM BROMIDE 0.02 % IN SOLN
0.5000 mg | Freq: Once | RESPIRATORY_TRACT | Status: AC
  Administered 2016-03-30: 0.5 mg via RESPIRATORY_TRACT

## 2016-03-30 MED ORDER — ALBUTEROL SULFATE (2.5 MG/3ML) 0.083% IN NEBU
2.5000 mg | INHALATION_SOLUTION | Freq: Once | RESPIRATORY_TRACT | Status: AC
  Administered 2016-03-30: 2.5 mg via RESPIRATORY_TRACT

## 2016-03-30 MED ORDER — AZITHROMYCIN 250 MG PO TABS: ORAL_TABLET | ORAL | 0 refills | Status: DC

## 2016-03-30 NOTE — Patient Instructions (Signed)
Please use your inhaler every 4-6 hours as needed for cough, wheeze or shortness of breath I have sent in an antibiotic to your pharmacy- please start it today Take Mucinex (generic is fine) and drink a lot of water to help your phlegm loosen If your breathing gets worse or if you run a fever, please come back to see [place] or go to Urgent Care or the Emergency Room if our office is closed.  Make a follow up appointment to see Allie Bossier in 4-8 weeks to follow up on your breathing to see if you need regular medicine for COPD.  Can take Delsym over the counter if needed for cough.

## 2016-03-30 NOTE — Progress Notes (Signed)
Subjective:    [name], male    DOB: 02-10-1946, 70 y.o.   MRN: [id number]  HPI This is a 70 yo male who presents today with cough and nasal congestion for 2-2.5 weeks ago. Started with nasal congestion and sore throat, moved to his chest. Mostl clear sputum, sometimes yellow, no worsening SOB, some wheezing. Ear pressure intermittently, no headache. No fever. Does not feel particularly bad, just has spells of increased SOB. No known sick contacts. Has diagnoses of copd, no current treatment, was previously on inhalers. Used to get bronchitis frequently when he smoked, has not had recently.   Past Medical History:  Diagnosis Date  . Allergic rhinitis, cause unspecified   . Carpal tunnel syndrome   . Chronic airway obstruction, not elsewhere classified   . Esophageal reflux   . Glaucoma (increased eye pressure)   . Lipoma of unspecified site   . Osteoarthrosis, unspecified whether generalized or localized, lower leg   . Other and unspecified hyperlipidemia   . Personal history of colonic polyps   . Personal history of other malignant neoplasm of skin   . Unspecified essential hypertension    Past Surgical History:  Procedure Laterality Date  . bone spur  06-2003   right thumb  . KNEE ARTHROSCOPY  09-2007   partial medial mesicecotmy, patellar chonfroplasty  . SHOULDER SURGERY  03-24-2007   removal bone spur  . TOTAL KNEE ARTHROPLASTY     03-2009 [facility]  . TOTAL KNEE ARTHROPLASTY Left 12/2012   [facility]   Family History  Problem Relation Age of Onset  . Diabetes Mother   . Hyperlipidemia Mother   . Hypertension Mother   . Heart attack Mother   . Obesity Mother   . Hyperlipidemia Father   . Hypertension Father   . Lung cancer Father   . Colon polyps Father   . Hyperlipidemia Brother   . Hypertension Brother   . Hyperlipidemia Brother   . Hypertension Brother   . Hypertension Sister   . Hyperlipidemia Sister   . Lung cancer Sister   . Hyperlipidemia  Sister    Social History  Substance Use Topics  . Smoking status: Former Smoker    Packs/day: 3.00    Years: 25.00    Quit date: 11/23/1996  . Smokeless tobacco: Never Used  . Alcohol use 0.0 oz/week     Comment: 4 mixed drinks per week      Review of Systems Per HPI    Objective:   Physical Exam  Constitutional: He is oriented to person, place, and time. He appears well-developed and well-nourished. No distress.  obese  HENT:  Head: Normocephalic and atraumatic.  Right Ear: External ear normal.  Left Ear: External ear normal.  Nose: Nose normal.  Mouth/Throat: Oropharynx is clear and moist.  Eyes: Conjunctivae are normal.  Neck: Normal range of motion. Neck supple.  Cardiovascular: Normal rate, regular rhythm and normal heart sounds.   Pulmonary/Chest: Effort normal. He has wheezes (fine, scattered, posteriorly).  Lymphadenopathy:    He has no cervical adenopathy.  Neurological: He is alert and oriented to person, place, and time.  Skin: Skin is warm and dry. He is not diaphoretic.  Psychiatric: He has a normal mood and affect. His behavior is normal. Judgment and thought content normal.  Vitals reviewed.     BP 140/78   Pulse 83   Temp 97.9 F (36.6 C)   Wt 257 lb (116.6 kg)   SpO2 97%  BMI 34.86 kg/m  Wt Readings from Last 3 Encounters:  03/30/16 257 lb (116.6 kg)  01/10/16 256 lb (116.1 kg)  06/23/15 258 lb 8 oz (117.3 kg)   Atrovent and albuterol nebulizer given in office. Subjective improvement, looser sounding cough, clearing of wheezing on exam.      Assessment & Plan:  1. COPD exacerbation ([place]) - add Mucinex, increase fluids - RTC precautions reviewed - albuterol (PROVENTIL) (2.5 MG/3ML) 0.083% nebulizer solution 2.5 mg; Take 3 mLs (2.5 mg total) by nebulization once. - ipratropium (ATROVENT) nebulizer solution 0.5 mg; Take 2.5 mLs (0.5 mg total) by nebulization once. - azithromycin (ZITHROMAX) 250 MG tablet; Take two tablets today then one a  day until finished  Dispense: 6 tablet; Refill: 0 - follow up with PCP in 4-8 weeks for PFTs to determine if maintenance medication for COPD needed - albuterol inhaler, 2 puffs q4-6 hours prn cough, wheeze  Clarene Reamer, FNP-BC  [facility]  03/30/2016 9:06 AM

## 2016-04-17 ENCOUNTER — Encounter: Payer: Self-pay | Admitting: Primary Care

## 2016-04-17 ENCOUNTER — Ambulatory Visit (INDEPENDENT_AMBULATORY_CARE_PROVIDER_SITE_OTHER)
Admission: RE | Admit: 2016-04-17 | Discharge: 2016-04-17 | Disposition: A | Discharge status: Home / Self Care | Payer: PPO | Source: Ambulatory Visit | Attending: Primary Care | Admitting: Primary Care

## 2016-04-17 ENCOUNTER — Ambulatory Visit (INDEPENDENT_AMBULATORY_CARE_PROVIDER_SITE_OTHER): Payer: PPO | Admitting: Primary Care

## 2016-04-17 VITALS — BP 144/82 | HR 74 | Temp 97.8°F | Ht 72.0 in | Wt 258.8 lb

## 2016-04-17 DIAGNOSIS — R05 Cough: Secondary | ICD-10-CM

## 2016-04-17 DIAGNOSIS — J449 Chronic obstructive pulmonary disease, unspecified: Secondary | ICD-10-CM

## 2016-04-17 DIAGNOSIS — R059 Cough, unspecified: Secondary | ICD-10-CM

## 2016-04-17 MED ORDER — FLUTICASONE-SALMETEROL 250-50 MCG/DOSE IN AEPB: 1.0000 | INHALATION_SPRAY | Freq: Two times a day (BID) | RESPIRATORY_TRACT | 3 refills | Status: DC

## 2016-04-17 NOTE — Progress Notes (Signed)
Pre visit review using our clinic review tool, if applicable. No additional management support is needed unless otherwise documented below in the visit note. 

## 2016-04-17 NOTE — Progress Notes (Signed)
Subjective:    [name], male    DOB: 04-16-1945, 71 y.o.   MRN: [id number]  HPI  Alan [name] is a 71 year old male with a history of COPD, allergic rhinitis, GERD, hypertension (managed on ACE) who presents today with a chief complaint of cough.   He was originally evaluated in our office on 03/30/16 with a 2-3 week history of cough with nasal congestion, productive cough with clear/yellow sputum. Also with increased SOB with coughing spells. He was diagnosed and treated for a COPD exacerbation with Azithromycin and albuterol inhaler.  Since his last visit he's not noticed an improvement in his cough. He's not used his albuterol inhaler for the past 1 week, but does believe he noticed improvement while using his inhaler. He denies congestion, rhinorrhea, sore throat, esophageal burning, epigastric pain. He's feeling well overall.  He was previously managed on inhalers for his COPD, but stopped using them as as this caused thrush. His cough is intermittent throughout the day and originates from his chest wall.  Review of Systems  Constitutional: Negative for fatigue and fever.  HENT: Positive for congestion. Negative for sinus pressure and sore throat.   Respiratory: Positive for cough and shortness of breath. Negative for wheezing.   Cardiovascular: Negative for chest pain.  Neurological: Negative for weakness.       Past Medical History:  Diagnosis Date  . Allergic rhinitis, cause unspecified   . Carpal tunnel syndrome   . Chronic airway obstruction, not elsewhere classified   . Esophageal reflux   . Glaucoma (increased eye pressure)   . Lipoma of unspecified site   . Osteoarthrosis, unspecified whether generalized or localized, lower leg   . Other and unspecified hyperlipidemia   . Personal history of colonic polyps   . Personal history of other malignant neoplasm of skin   . Unspecified essential hypertension      Social History   Social History  .  Marital status: Married    Spouse name: N/A  . Number of children: 2  . Years of education: N/A   Occupational History  . [employer]    Social History Main Topics  . Smoking status: Former Smoker    Packs/day: 3.00    Years: 25.00    Quit date: 11/23/1996  . Smokeless tobacco: Never Used  . Alcohol use 0.0 oz/week     Comment: 4 mixed drinks per week  . Drug use: No  . Sexual activity: Not on file   Other Topics Concern  . Not on file   Social History Narrative   Regular exercise -- no             Past Surgical History:  Procedure Laterality Date  . bone spur  06-2003   right thumb  . KNEE ARTHROSCOPY  09-2007   partial medial mesicecotmy, patellar chonfroplasty  . SHOULDER SURGERY  03-24-2007   removal bone spur  . TOTAL KNEE ARTHROPLASTY     03-2009 [facility]  . TOTAL KNEE ARTHROPLASTY Left 12/2012   [facility]    Family History  Problem Relation Age of Onset  . Diabetes Mother   . Hyperlipidemia Mother   . Hypertension Mother   . Heart attack Mother   . Obesity Mother   . Hyperlipidemia Father   . Hypertension Father   . Lung cancer Father   . Colon polyps Father   . Hyperlipidemia Brother   . Hypertension Brother   . Hyperlipidemia Brother   .  Hypertension Brother   . Hypertension Sister   . Hyperlipidemia Sister   . Lung cancer Sister   . Hyperlipidemia Sister     No Known Allergies  Current Outpatient Prescriptions on File Prior to Visit  Medication Sig Dispense Refill  . albuterol (PROVENTIL HFA;VENTOLIN HFA) 108 (90 Base) MCG/ACT inhaler Inhale 2 puffs into the lungs every 4 (four) hours as needed for wheezing or shortness of breath (cough, shortness of breath or wheezing.). 1 Inhaler 1  . amLODipine (NORVASC) 5 MG tablet TAKE 1 TABLET BY MOUTH DAILY 90 tablet 3  . aspirin 81 MG tablet Take 81 mg by mouth daily.      Marland Kitchen atorvastatin (LIPITOR) 40 MG tablet TAKE 1 TABLET BY MOUTH DAILY 90 tablet 3  . fish oil-omega-3 fatty acids  1000 MG capsule Take 2 g by mouth daily.      Marland Kitchen lisinopril (PRINIVIL,ZESTRIL) 40 MG tablet TAKE 1 TABLET BY MOUTH DAILY 90 tablet 3  . meloxicam (MOBIC) 15 MG tablet Take 1 tablet (15 mg total) by mouth daily. 30 tablet 0  . Multiple Vitamin (MULTIVITAMIN) tablet Take 1 tablet by mouth daily.      Marland Kitchen omeprazole (PRILOSEC) 20 MG capsule Take 20 mg by mouth daily.      . sertraline (ZOLOFT) 50 MG tablet TAKE 1 TABLET BY MOUTH DAILY 90 tablet 3  . triamcinolone cream (KENALOG) 0.1 % Apply 1 application topically 2 (two) times daily. 30 g 0   No current facility-administered medications on file prior to visit.     BP (!) 144/82   Pulse 74   Temp 97.8 F (36.6 C) (Oral)   Ht 6' (1.829 m)   Wt 258 lb 12.8 oz (117.4 kg)   SpO2 98%   BMI 35.10 kg/m    Objective:   Physical Exam  Constitutional: He appears well-nourished. He does not appear ill.  HENT:  Right Ear: Tympanic membrane and ear canal normal.  Left Ear: Tympanic membrane and ear canal normal.  Nose: No mucosal edema. Right sinus exhibits no maxillary sinus tenderness and no frontal sinus tenderness. Left sinus exhibits no maxillary sinus tenderness and no frontal sinus tenderness.  Mouth/Throat: Oropharynx is clear and moist.  Eyes: Conjunctivae are normal.  Neck: Neck supple.  Cardiovascular: Normal rate and regular rhythm.   Pulmonary/Chest: Effort normal and breath sounds normal. He has no wheezes. He has no rales.  Skin: Skin is warm and dry.          Assessment & Plan:  Chronic Cough:  Ongoing x 1 month, no improvement with antibiotics. Some improvement with albuterol. Exam today without evidence of bacterial/viral, GERD, ACE involvement. Given history of COPD suspect cough related. Check chest xray today to rule out any other cause. This was positive for emphysema, no other underlying abnormality. Rx for Advair inhaler sent to pharmacy. Discussed to rinse mouth after use. Discussed indications for  albuterol. Will call him in 2-3 weeks for an update. If no improvement, consider ACE involvement.  Sheral Flow, NP

## 2016-04-17 NOTE — Patient Instructions (Addendum)
Start Advair (fluticasone-salometerol) inhaler for COPD and cough. Inhale 1 puff into the lungs twice daily, everyday.  You may use the albuterol inhaler every 6 hours as needed for shortness of breath/wheezing.   We will call you in about 2-3 weeks to see if this is helping.  Complete xray(s) prior to leaving today. I will notify you of your results once received.  It was a pleasure to see you today!

## 2016-05-01 ENCOUNTER — Telehealth: Payer: Self-pay | Admitting: Primary Care

## 2016-05-01 DIAGNOSIS — M898X1 Other specified disorders of bone, shoulder: Secondary | ICD-10-CM

## 2016-05-01 NOTE — Telephone Encounter (Signed)
-----   Message from Pleas Koch, NP sent at 04/17/2016  8:17 PM EST ----- Regarding: Cough Any improvement in cough since we started Advair inhaler?

## 2016-05-01 NOTE — Telephone Encounter (Signed)
Message left for patient to return my call.  

## 2016-05-02 NOTE — Telephone Encounter (Signed)
Please notify patient that I'm glad to hear his symptoms are improved, I do recommend daily use. Have him notify me when his Advair is running low and I'll switch him to something that may be more affordable.

## 2016-05-02 NOTE — Telephone Encounter (Signed)
Spoken to patient and he stated that the Advair helped a little. Patient said he does not do every day. Advair was kind of expensive as well.  Also patient stated that his collar bone is still popping when he moves his right arm/shoulder. Patient stated that should he sees DR Copland or see a specialist. Please advise.

## 2016-05-03 NOTE — Telephone Encounter (Signed)
Please apologize for me, I overlooked his question.  I think Dr. Lorelei Pont would be a good resource.

## 2016-05-03 NOTE — Telephone Encounter (Signed)
I let patient know [name]'s comments.  Patient wanted to know what he should do about his collar bone.  Please call patient back at [phone].

## 2016-05-04 NOTE — Telephone Encounter (Signed)
Noted  Referral placed.

## 2016-05-04 NOTE — Telephone Encounter (Signed)
Spoken to patient and we checked over Dr Copland's schedule. Patient stated that the days Dr Lillie Fragmin available would not work for him. Patient would like referral to ortho

## 2016-05-07 ENCOUNTER — Telehealth: Payer: Self-pay | Admitting: Primary Care

## 2016-05-07 NOTE — Telephone Encounter (Signed)
I spoke with the patient this afternoon as he is unhappy that we have not addressed his should pain faster.  He stated that he mentioned the shoulder pain when he saw Jackelyn Poling and [name] the last time but it was "blown off".  He [place] unhappy and wants to leave [facility].  He said he has been a patient here a long time and has never been treated like this.  He feels we have taken too long to address his pain.  I apologized that he feels this way and explained that they probably wanted to address his breathing issues first as those are more serious concerns.  I explained that I saw the referal was ordered for ortho and would follow up on the status and have Rosaria Ferries call him to update.

## 2016-05-07 NOTE — Telephone Encounter (Signed)
Noted and am sorry he feels this way as this was not our intension. He was unhappy with Dr. Lorelei Pont before he switched to me in 2016. We have always treated his concerns with importance. I wish him the best.

## 2016-05-09 DIAGNOSIS — M25511 Pain in right shoulder: Secondary | ICD-10-CM | POA: Diagnosis not present

## 2016-05-11 ENCOUNTER — Ambulatory Visit: Payer: PPO | Admitting: Primary Care

## 2016-05-17 DIAGNOSIS — L905 Scar conditions and fibrosis of skin: Secondary | ICD-10-CM | POA: Diagnosis not present

## 2016-05-17 DIAGNOSIS — L570 Actinic keratosis: Secondary | ICD-10-CM | POA: Diagnosis not present

## 2016-05-17 DIAGNOSIS — Z85828 Personal history of other malignant neoplasm of skin: Secondary | ICD-10-CM | POA: Diagnosis not present

## 2016-05-18 ENCOUNTER — Telehealth: Payer: Self-pay

## 2016-05-18 DIAGNOSIS — J069 Acute upper respiratory infection, unspecified: Secondary | ICD-10-CM

## 2016-05-18 MED ORDER — AZITHROMYCIN 250 MG PO TABS: ORAL_TABLET | ORAL | 0 refills | Status: DC

## 2016-05-18 NOTE — Telephone Encounter (Signed)
Pt left v/m; pt seen 04/17/16; pt said did not clear up from cough; now pt has head congestion, S/T, low grade fever,prod cough with yellow phlegm, no H/A, SOB or wheezing. Pt request abx to [facility]. Pt request cb.

## 2016-05-18 NOTE — Telephone Encounter (Signed)
Spoken to patient. He stated that he didn't feel better since he was seen on 04/17/2016. Patient have nasal congestion, felt feverish, and productive cough that is greenish yellow.  Yes, patient have tried OTC but not any better.

## 2016-05-18 NOTE — Telephone Encounter (Signed)
Noted. Please notify him that I sent in antibiotics to his pharmacy. Try Mucinex for congestion.

## 2016-05-18 NOTE — Telephone Encounter (Signed)
When did the sore throat, congestion, fever, and productive cough begin? He didn't have those other symptoms at his visit on 04/17/16. Has he tried taking anything OTC?

## 2016-05-18 NOTE — Telephone Encounter (Signed)
Spoken and notified patient of [name]'s comments. Patient verbalized understanding. 

## 2016-05-24 NOTE — Telephone Encounter (Signed)
Pt needs to be re seen before further antibitoics, tommroow is fine. Butch Penny has left the office. Please all pt back.

## 2016-05-24 NOTE — Telephone Encounter (Signed)
Pt left v/m;now pt has chest congestion, prod cough with yellow phlegm; pt hurts on both sides of chest when breathes. No wheezing or SOB.no fever. pt request refill of z pak or different abx. [place]. Allie Bossier NP out of office this afternoon.Please advise.

## 2016-05-24 NOTE — Telephone Encounter (Signed)
Pt was notified and scheduled appt with Allie Bossier NP on 05/25/16.

## 2016-05-25 ENCOUNTER — Ambulatory Visit (INDEPENDENT_AMBULATORY_CARE_PROVIDER_SITE_OTHER)
Admission: RE | Admit: 2016-05-25 | Discharge: 2016-05-25 | Disposition: A | Discharge status: Home / Self Care | Payer: PPO | Source: Ambulatory Visit | Attending: Primary Care | Admitting: Primary Care

## 2016-05-25 ENCOUNTER — Ambulatory Visit (INDEPENDENT_AMBULATORY_CARE_PROVIDER_SITE_OTHER): Payer: PPO | Admitting: Primary Care

## 2016-05-25 ENCOUNTER — Encounter: Payer: Self-pay | Admitting: Primary Care

## 2016-05-25 VITALS — BP 142/86 | HR 74 | Temp 98.1°F | Ht 72.0 in | Wt 264.0 lb

## 2016-05-25 DIAGNOSIS — R05 Cough: Secondary | ICD-10-CM | POA: Diagnosis not present

## 2016-05-25 DIAGNOSIS — R053 Chronic cough: Secondary | ICD-10-CM | POA: Insufficient documentation

## 2016-05-25 DIAGNOSIS — R0602 Shortness of breath: Secondary | ICD-10-CM | POA: Diagnosis not present

## 2016-05-25 DIAGNOSIS — R059 Cough, unspecified: Secondary | ICD-10-CM

## 2016-05-25 HISTORY — DX: Chronic cough: R05.3

## 2016-05-25 MED ORDER — PREDNISONE 20 MG PO TABS
ORAL_TABLET | ORAL | 0 refills | Status: DC
Start: 2016-05-25 — End: 2016-06-05

## 2016-05-25 MED ORDER — BUDESONIDE-FORMOTEROL FUMARATE 160-4.5 MCG/ACT IN AERO: 2.0000 | INHALATION_SPRAY | Freq: Two times a day (BID) | RESPIRATORY_TRACT | 3 refills | Status: DC

## 2016-05-25 NOTE — Patient Instructions (Addendum)
Complete xray(s) prior to leaving today. I will notify you of your results once received.  Start Prednisone tablets. Take 2 tablets daily for 5 days.  Start budesonide-formoterol (Symbicort) inhaler for COPD. Inhale 2 puffs into the lungs twice daily, everyday.   Use the albuterol inhaler for wheezing and shortness of breath. Inhale 2 puffs into the lungs every 6 to 8 hours as needed for wheezing and/or shortness of breath.   Please update me next week.  It was a pleasure to see you today!

## 2016-05-25 NOTE — Progress Notes (Signed)
Pre visit review using our clinic review tool, if applicable. No additional management support is needed unless otherwise documented below in the visit note. 

## 2016-05-25 NOTE — Progress Notes (Signed)
Subjective:    [name], male    DOB: 06/28/1945, 71 y.o.   MRN: [id number]  HPI  Mr. Alan [name] is a 71 year old male with a history of COPD, allergic rhinitis, GERD, hypertension managed on ACE who presents today with a chief complaint of chronic cough with chest congestion. He also reports runny nose and nasal congestion. His cough has been chronic for years. He was last evaluated on 04/17/16 for cough/chest congestion and was prescribed Advair and treated with conservative measures for viral involvement. He called into our office several weeks later reporting productive cough with green sputum and was then treated with Azithromycin.   These symptoms have been present since December 2017 and has now had two courses of Azithromycin without improvement. His ACE was held in December 2016 without any change in his cough. He is compliant to his Advair daily but this is expensive and needs a cheaper alternative. He's compliant to his omeprazole daily and denies esophageal reflux and epigastric pain.   He's not noticed improvement in his cough/congestion since using the Advair inhaler. He's using the albuterol inhaler sparingly. He's been taking Delsym and Mucinex with temporary improvement. His cough is worse in the morning when he wakes. He's not managed on an antihistamine. He was running low grade fevers 1 week ago which has improved since the Zpak. He denies hemoptysis, unexplained weight loss. His cough is productive with yellow sputum.   Review of Systems  Constitutional: Negative for chills, fatigue and fever.  HENT: Positive for congestion and postnasal drip. Negative for sore throat.   Respiratory: Positive for cough, shortness of breath and wheezing.   Cardiovascular: Negative for chest pain.       Past Medical History:  Diagnosis Date  . Allergic rhinitis, cause unspecified   . Carpal tunnel syndrome   . Chronic airway obstruction, not elsewhere classified   .  Esophageal reflux   . Glaucoma (increased eye pressure)   . Lipoma of unspecified site   . Osteoarthrosis, unspecified whether generalized or localized, lower leg   . Other and unspecified hyperlipidemia   . Personal history of colonic polyps   . Personal history of other malignant neoplasm of skin   . Unspecified essential hypertension      Social History   Social History  . Marital status: Married    Spouse name: N/A  . Number of children: 2  . Years of education: N/A   Occupational History  . [employer]    Social History Main Topics  . Smoking status: Former Smoker    Packs/day: 3.00    Years: 25.00    Quit date: 11/23/1996  . Smokeless tobacco: Never Used  . Alcohol use 0.0 oz/week     Comment: 4 mixed drinks per week  . Drug use: No  . Sexual activity: Not on file   Other Topics Concern  . Not on file   Social History Narrative   Regular exercise -- no             Past Surgical History:  Procedure Laterality Date  . bone spur  06-2003   right thumb  . KNEE ARTHROSCOPY  09-2007   partial medial mesicecotmy, patellar chonfroplasty  . SHOULDER SURGERY  03-24-2007   removal bone spur  . TOTAL KNEE ARTHROPLASTY     03-2009 [facility]  . TOTAL KNEE ARTHROPLASTY Left 12/2012   [facility]    Family History  Problem Relation Age of Onset  .  Diabetes Mother   . Hyperlipidemia Mother   . Hypertension Mother   . Heart attack Mother   . Obesity Mother   . Hyperlipidemia Father   . Hypertension Father   . Lung cancer Father   . Colon polyps Father   . Hyperlipidemia Brother   . Hypertension Brother   . Hyperlipidemia Brother   . Hypertension Brother   . Hypertension Sister   . Hyperlipidemia Sister   . Lung cancer Sister   . Hyperlipidemia Sister     No Known Allergies  Current Outpatient Prescriptions on File Prior to Visit  Medication Sig Dispense Refill  . albuterol (PROVENTIL HFA;VENTOLIN HFA) 108 (90 Base) MCG/ACT inhaler Inhale 2  puffs into the lungs every 4 (four) hours as needed for wheezing or shortness of breath (cough, shortness of breath or wheezing.). 1 Inhaler 1  . amLODipine (NORVASC) 5 MG tablet TAKE 1 TABLET BY MOUTH DAILY 90 tablet 3  . aspirin 81 MG tablet Take 81 mg by mouth daily.      Marland Kitchen atorvastatin (LIPITOR) 40 MG tablet TAKE 1 TABLET BY MOUTH DAILY 90 tablet 3  . fish oil-omega-3 fatty acids 1000 MG capsule Take 2 g by mouth daily.      Marland Kitchen lisinopril (PRINIVIL,ZESTRIL) 40 MG tablet TAKE 1 TABLET BY MOUTH DAILY 90 tablet 3  . meloxicam (MOBIC) 15 MG tablet Take 1 tablet (15 mg total) by mouth daily. 30 tablet 0  . Multiple Vitamin (MULTIVITAMIN) tablet Take 1 tablet by mouth daily.      Marland Kitchen omeprazole (PRILOSEC) 20 MG capsule Take 20 mg by mouth daily.      . sertraline (ZOLOFT) 50 MG tablet TAKE 1 TABLET BY MOUTH DAILY 90 tablet 3  . triamcinolone cream (KENALOG) 0.1 % Apply 1 application topically 2 (two) times daily. 30 g 0   No current facility-administered medications on file prior to visit.     BP (!) 142/86   Pulse 74   Temp 98.1 F (36.7 C) (Oral)   Ht 6' (1.829 m)   Wt 264 lb (119.7 kg)   SpO2 97%   BMI 35.80 kg/m    Objective:   Physical Exam  Constitutional: He appears well-nourished. He does not appear ill.  HENT:  Right Ear: Tympanic membrane and ear canal normal.  Left Ear: Tympanic membrane and ear canal normal.  Nose: No mucosal edema. Right sinus exhibits no maxillary sinus tenderness and no frontal sinus tenderness. Left sinus exhibits no maxillary sinus tenderness and no frontal sinus tenderness.  Mouth/Throat: Oropharynx is clear and moist.  Eyes: Conjunctivae are normal.  Neck: Neck supple.  Cardiovascular: Normal rate and regular rhythm.   Pulmonary/Chest: Effort normal. He has no decreased breath sounds. He has wheezes in the right upper field, the right lower field, the left upper field and the left lower field. He has no rhonchi. He has no rales.  Skin: Skin is warm  and dry.          Assessment & Plan:

## 2016-05-25 NOTE — Assessment & Plan Note (Signed)
Ongoing for years, mostly intermittent, persistent since December 2017.  Held ACE in December 2016 x 1 week, no improvement. Will switch Adviar to Symbicort given cost. Discussed to use albuterol as needed.  Rx for prednisone burst sent to pharmacy. Repeat chest xray today. Consider holding ACE for longer duration, increase Amlodipine, add HCTZ. Start Zyrtec. If no improvement with those interventions above, consider echocardiogram.

## 2016-06-04 ENCOUNTER — Encounter: Payer: Self-pay | Admitting: Primary Care

## 2016-06-04 NOTE — Telephone Encounter (Signed)
Patient stated that this started since Friday evening. Been using the inhaler but no improvement. Patient feeling tired, cough with chest congestion, having teeth pain, nasal congestion. Low grade about 99.4 all weekend.   No, he has not have improvement with Zyrtec.

## 2016-06-04 NOTE — Telephone Encounter (Signed)
Please call him and get his symptoms. Any fevers? Any improvement in cough since he started Zyrtec?

## 2016-06-05 ENCOUNTER — Ambulatory Visit (INDEPENDENT_AMBULATORY_CARE_PROVIDER_SITE_OTHER): Payer: PPO | Admitting: Primary Care

## 2016-06-05 ENCOUNTER — Encounter: Payer: Self-pay | Admitting: Primary Care

## 2016-06-05 VITALS — BP 144/94 | HR 93 | Temp 98.5°F | Ht 72.0 in | Wt 258.0 lb

## 2016-06-05 DIAGNOSIS — K219 Gastro-esophageal reflux disease without esophagitis: Secondary | ICD-10-CM | POA: Diagnosis not present

## 2016-06-05 DIAGNOSIS — R05 Cough: Secondary | ICD-10-CM | POA: Diagnosis not present

## 2016-06-05 DIAGNOSIS — I10 Essential (primary) hypertension: Secondary | ICD-10-CM

## 2016-06-05 DIAGNOSIS — J449 Chronic obstructive pulmonary disease, unspecified: Secondary | ICD-10-CM

## 2016-06-05 DIAGNOSIS — J3089 Other allergic rhinitis: Secondary | ICD-10-CM | POA: Diagnosis not present

## 2016-06-05 DIAGNOSIS — R053 Chronic cough: Secondary | ICD-10-CM

## 2016-06-05 MED ORDER — MONTELUKAST SODIUM 10 MG PO TABS: 10.0000 mg | ORAL_TABLET | Freq: Every day | ORAL | 0 refills | Status: DC

## 2016-06-05 MED ORDER — HYDROCHLOROTHIAZIDE 25 MG PO TABS: 25.0000 mg | ORAL_TABLET | Freq: Every day | ORAL | 0 refills | Status: DC

## 2016-06-05 MED ORDER — AMLODIPINE BESYLATE 10 MG PO TABS: 10.0000 mg | ORAL_TABLET | Freq: Every day | ORAL | 0 refills | Status: DC

## 2016-06-05 NOTE — Patient Instructions (Signed)
High Blood Pressure:  Stop Lisinopril 40 mg as this is likely causing your cough.  We've increased your Amlodipine from 5 mg to 10 mg. I've sent a new prescription for the 10 mg tablets to your pharmacy. You may take two of the 5 mg tablets until your current bottle is empty.  Start hydrochlorothiazide 25 mg tablets for blood pressure. Take 1 tablet by mouth once daily.  COPD:  Continue Symbicort or Advair everyday. Use the albuterol inhaler as needed for shortness of breath, cough, wheezing.  Allergies/Runny Nose/Congestion:  Start montelukast (Singulair) 10 mg. Take 1 tablet by mouth once daily. Do not take Zyrtec.  Monitor your blood pressure at home and follow up with me in 2 weeks for blood pressure check.  Cough:  Continue Robitussin and Mucinex for cough and congestion.  Please notify me if you develop persistent fevers of 101, start coughing up green mucous, notice increased fatigue or weakness, or feel worse after 1 week of onset of symptoms.   Increase consumption of water intake and rest.  Follow up in 2 weeks for BP and cough check. It was a pleasure to see you today!

## 2016-06-05 NOTE — Progress Notes (Signed)
Pre visit review using our clinic review tool, if applicable. No additional management support is needed unless otherwise documented below in the visit note. 

## 2016-06-05 NOTE — Assessment & Plan Note (Signed)
No improvement since interventions last visit. Stop ACE, switch to other antihypertensive treatment. Add Singulair. If no improvement then consider Spiriva. Also consider pulmonology evaluation.

## 2016-06-05 NOTE — Assessment & Plan Note (Signed)
Compliant to omeprazole

## 2016-06-05 NOTE — Assessment & Plan Note (Signed)
Suspect ACE to be contributing to cough, will discontinue now. Increase Amlodipine to 10 mg, add HCTZ 25 mg. Will have him check BP at home, follow up in 2 weeks for recheck and BMP.

## 2016-06-05 NOTE — Assessment & Plan Note (Signed)
Suspect this could also be contributing to cough. Stop Zyrtec as this is ineffective, Rx for Singulair sent to pharmacy.

## 2016-06-05 NOTE — Progress Notes (Signed)
Subjective:    [name], male    DOB: 08-11-1945, 71 y.o.   MRN: [id number]  HPI  Alan [name] is a 71 year old male with a history of COPD, GERD, allergic rhinitis, HTN managed on ACE who presents today with a chief complaint of cough/congestion. He also reports low grade fevers over the weekend and fatigue. His acute symptoms began 5 days ago. His cough is mildly productive with yellow sputum. He's been using Mucinex and Robitussin with improvement.  Overall he's feeling better.   He was last treated on 05/25/16 for symptoms of chronic cough. His Advair was too expensive so this was changed to Symbicort. He's used the Symbicort without improvement in chronic cough. It was encouraged he use his albuterol as needed. He is compliant to omeprazole daily for GERD.   Review of Systems  Constitutional: Negative for chills, fatigue and fever.  HENT: Positive for congestion and postnasal drip. Negative for ear pain, sinus pressure and sore throat.   Respiratory: Positive for cough. Negative for shortness of breath and wheezing.   Cardiovascular: Negative for chest pain.       Past Medical History:  Diagnosis Date  . Allergic rhinitis, cause unspecified   . Carpal tunnel syndrome   . Chronic airway obstruction, not elsewhere classified   . Esophageal reflux   . Glaucoma (increased eye pressure)   . Lipoma of unspecified site   . Osteoarthrosis, unspecified whether generalized or localized, lower leg   . Other and unspecified hyperlipidemia   . Personal history of colonic polyps   . Personal history of other malignant neoplasm of skin   . Unspecified essential hypertension      Social History   Social History  . Marital status: Married    Spouse name: N/A  . Number of children: 2  . Years of education: N/A   Occupational History  . [employer]    Social History Main Topics  . Smoking status: Former Smoker    Packs/day: 3.00    Years: 25.00     Quit date: 11/23/1996  . Smokeless tobacco: Never Used  . Alcohol use 0.0 oz/week     Comment: 4 mixed drinks per week  . Drug use: No  . Sexual activity: Not on file   Other Topics Concern  . Not on file   Social History Narrative   Regular exercise -- no             Past Surgical History:  Procedure Laterality Date  . bone spur  06-2003   right thumb  . KNEE ARTHROSCOPY  09-2007   partial medial mesicecotmy, patellar chonfroplasty  . SHOULDER SURGERY  03-24-2007   removal bone spur  . TOTAL KNEE ARTHROPLASTY     03-2009 [facility]  . TOTAL KNEE ARTHROPLASTY Left 12/2012   [facility]    Family History  Problem Relation Age of Onset  . Diabetes Mother   . Hyperlipidemia Mother   . Hypertension Mother   . Heart attack Mother   . Obesity Mother   . Hyperlipidemia Father   . Hypertension Father   . Lung cancer Father   . Colon polyps Father   . Hyperlipidemia Brother   . Hypertension Brother   . Hyperlipidemia Brother   . Hypertension Brother   . Hypertension Sister   . Hyperlipidemia Sister   . Lung cancer Sister   . Hyperlipidemia Sister     No Known Allergies  Current Outpatient Prescriptions  on File Prior to Visit  Medication Sig Dispense Refill  . albuterol (PROVENTIL HFA;VENTOLIN HFA) 108 (90 Base) MCG/ACT inhaler Inhale 2 puffs into the lungs every 4 (four) hours as needed for wheezing or shortness of breath (cough, shortness of breath or wheezing.). 1 Inhaler 1  . aspirin 81 MG tablet Take 81 mg by mouth daily.      Marland Kitchen atorvastatin (LIPITOR) 40 MG tablet TAKE 1 TABLET BY MOUTH DAILY 90 tablet 3  . budesonide-formoterol (SYMBICORT) 160-4.5 MCG/ACT inhaler Inhale 2 puffs into the lungs 2 (two) times daily. 1 Inhaler 3  . fish oil-omega-3 fatty acids 1000 MG capsule Take 2 g by mouth daily.      . meloxicam (MOBIC) 15 MG tablet Take 1 tablet (15 mg total) by mouth daily. 30 tablet 0  . Multiple Vitamin (MULTIVITAMIN) tablet Take 1 tablet by mouth daily.      Marland Kitchen  omeprazole (PRILOSEC) 20 MG capsule Take 20 mg by mouth daily.      . sertraline (ZOLOFT) 50 MG tablet TAKE 1 TABLET BY MOUTH DAILY 90 tablet 3  . triamcinolone cream (KENALOG) 0.1 % Apply 1 application topically 2 (two) times daily. 30 g 0   No current facility-administered medications on file prior to visit.     BP (!) 144/94   Pulse 93   Temp 98.5 F (36.9 C) (Oral)   Ht 6' (1.829 m)   Wt 258 lb (117 kg)   SpO2 98%   BMI 34.99 kg/m    Objective:   Physical Exam  Constitutional: He appears well-nourished. He does not appear ill.  HENT:  Right Ear: Tympanic membrane and ear canal normal.  Left Ear: Tympanic membrane and ear canal normal.  Nose: No mucosal edema. Right sinus exhibits no maxillary sinus tenderness and no frontal sinus tenderness. Left sinus exhibits no maxillary sinus tenderness and no frontal sinus tenderness.  Mouth/Throat: Oropharynx is clear and moist.  Eyes: Conjunctivae are normal.  Neck: Neck supple.  Cardiovascular: Normal rate and regular rhythm.   Pulmonary/Chest: Effort normal and breath sounds normal. He has no wheezes. He has no rales.  Mildly congested cough during exam.  Skin: Skin is warm and dry.          Assessment & Plan:  URI:  Cough, congestion, low grade fevers x 5 days. Overall feeling better with OTC treatment. Lungs clear today, vitals normal. Appears stable. Suspect viral involvement. Continue OTC treatment. Will continue to work on addressing cough. Return precautions provided.  Sheral Flow, NP

## 2016-06-05 NOTE — Assessment & Plan Note (Signed)
No improvement  In cough with Symbicort. Will have him finish this for now. Consider Spiriva if no improvement after removal of ACE and addition of Singulair.

## 2016-06-11 DIAGNOSIS — M7542 Impingement syndrome of left shoulder: Secondary | ICD-10-CM | POA: Diagnosis not present

## 2016-06-19 ENCOUNTER — Encounter: Payer: Self-pay | Admitting: Primary Care

## 2016-06-19 ENCOUNTER — Ambulatory Visit (INDEPENDENT_AMBULATORY_CARE_PROVIDER_SITE_OTHER): Payer: PPO | Admitting: Primary Care

## 2016-06-19 DIAGNOSIS — R05 Cough: Secondary | ICD-10-CM | POA: Diagnosis not present

## 2016-06-19 DIAGNOSIS — J3089 Other allergic rhinitis: Secondary | ICD-10-CM

## 2016-06-19 DIAGNOSIS — R053 Chronic cough: Secondary | ICD-10-CM

## 2016-06-19 DIAGNOSIS — I10 Essential (primary) hypertension: Secondary | ICD-10-CM | POA: Diagnosis not present

## 2016-06-19 DIAGNOSIS — J449 Chronic obstructive pulmonary disease, unspecified: Secondary | ICD-10-CM

## 2016-06-19 NOTE — Assessment & Plan Note (Signed)
Improved overall, now more dry. Continue to withhold lisinopril. Continue Singulair and PPI. Would like to give this additional time to improve. Consider echocardiogram and/or pulmonology evaluation with sleep study if no improvement in cough in 2 weeks.

## 2016-06-19 NOTE — Assessment & Plan Note (Signed)
Improved with Singulair, continue same.

## 2016-06-19 NOTE — Progress Notes (Addendum)
Subjective:    [name], male    DOB: 06/02/1945, 71 y.o.   MRN: [id number]  HPI  Alan [name] is a 71 year old male who presents today for follow up of chronic cough.   Present for over 1 year without improvement on Advair alone. Already managed on omeprazole for esophageal reflux and OTC antihistamine for allergic rhinitis. Last visit we switched his lisinopril to another antihypertensive and added Singulair.  Since his last visit his cough has changed to more of a dry cough as he's had significant improvement in mucous production. The frequency of his cough is about the same. He's been monitoring his BP readings at home which are running 110's/130's/70's. He has recently started exercising again and was able to walk for 25 minutes on the treadmill last week.  He thinks he's improving overall. He does endorse snoring at night and has never undergone sleep study for sleep apnea evaluation. He doesn't sleep well in general.    Review of Systems  Constitutional: Negative for fatigue.  HENT: Positive for congestion and postnasal drip. Negative for sinus pressure and sore throat.   Respiratory: Positive for cough. Negative for shortness of breath and wheezing.   Cardiovascular: Negative for chest pain.  Gastrointestinal:       No esophageal reflux symptoms       Past Medical History:  Diagnosis Date  . Allergic rhinitis, cause unspecified   . Carpal tunnel syndrome   . Chronic airway obstruction, not elsewhere classified   . Esophageal reflux   . Glaucoma (increased eye pressure)   . Lipoma of unspecified site   . Osteoarthrosis, unspecified whether generalized or localized, lower leg   . Other and unspecified hyperlipidemia   . Personal history of colonic polyps   . Personal history of other malignant neoplasm of skin   . Unspecified essential hypertension      Social History   Social History  . Marital status: Married    Spouse name: N/A  . Number of  children: 2  . Years of education: N/A   Occupational History  . [employer]    Social History Main Topics  . Smoking status: Former Smoker    Packs/day: 3.00    Years: 25.00    Quit date: 11/23/1996  . Smokeless tobacco: Never Used  . Alcohol use 0.0 oz/week     Comment: 4 mixed drinks per week  . Drug use: No  . Sexual activity: Not on file   Other Topics Concern  . Not on file   Social History Narrative   Regular exercise -- no             Past Surgical History:  Procedure Laterality Date  . bone spur  06-2003   right thumb  . KNEE ARTHROSCOPY  09-2007   partial medial mesicecotmy, patellar chonfroplasty  . SHOULDER SURGERY  03-24-2007   removal bone spur  . TOTAL KNEE ARTHROPLASTY     03-2009 [facility]  . TOTAL KNEE ARTHROPLASTY Left 12/2012   [facility]    Family History  Problem Relation Age of Onset  . Diabetes Mother   . Hyperlipidemia Mother   . Hypertension Mother   . Heart attack Mother   . Obesity Mother   . Hyperlipidemia Father   . Hypertension Father   . Lung cancer Father   . Colon polyps Father   . Hyperlipidemia Brother   . Hypertension Brother   . Hyperlipidemia Brother   .  Hypertension Brother   . Hypertension Sister   . Hyperlipidemia Sister   . Lung cancer Sister   . Hyperlipidemia Sister     No Known Allergies  Current Outpatient Prescriptions on File Prior to Visit  Medication Sig Dispense Refill  . albuterol (PROVENTIL HFA;VENTOLIN HFA) 108 (90 Base) MCG/ACT inhaler Inhale 2 puffs into the lungs every 4 (four) hours as needed for wheezing or shortness of breath (cough, shortness of breath or wheezing.). 1 Inhaler 1  . amLODipine (NORVASC) 10 MG tablet Take 1 tablet (10 mg total) by mouth daily. 90 tablet 0  . aspirin 81 MG tablet Take 81 mg by mouth daily.      Marland Kitchen atorvastatin (LIPITOR) 40 MG tablet TAKE 1 TABLET BY MOUTH DAILY 90 tablet 3  . budesonide-formoterol (SYMBICORT) 160-4.5 MCG/ACT inhaler Inhale 2 puffs  into the lungs 2 (two) times daily. 1 Inhaler 3  . fish oil-omega-3 fatty acids 1000 MG capsule Take 2 g by mouth daily.      . hydrochlorothiazide (HYDRODIURIL) 25 MG tablet Take 1 tablet (25 mg total) by mouth daily. 90 tablet 0  . meloxicam (MOBIC) 15 MG tablet Take 1 tablet (15 mg total) by mouth daily. 30 tablet 0  . montelukast (SINGULAIR) 10 MG tablet Take 1 tablet (10 mg total) by mouth at bedtime. 90 tablet 0  . Multiple Vitamin (MULTIVITAMIN) tablet Take 1 tablet by mouth daily.      Marland Kitchen omeprazole (PRILOSEC) 20 MG capsule Take 20 mg by mouth daily.      . sertraline (ZOLOFT) 50 MG tablet TAKE 1 TABLET BY MOUTH DAILY 90 tablet 3  . triamcinolone cream (KENALOG) 0.1 % Apply 1 application topically 2 (two) times daily. 30 g 0   No current facility-administered medications on file prior to visit.     BP (!) 150/94   Pulse 81   Temp 98.2 F (36.8 C) (Oral)   Ht 6' (1.829 m)   Wt 261 lb (118.4 kg)   SpO2 97%   BMI 35.40 kg/m    Objective:   Physical Exam  Constitutional: He appears well-nourished.  HENT:  Right Ear: Tympanic membrane and ear canal normal.  Left Ear: Tympanic membrane and ear canal normal.  Nose: No mucosal edema. Right sinus exhibits no maxillary sinus tenderness and no frontal sinus tenderness. Left sinus exhibits no maxillary sinus tenderness and no frontal sinus tenderness.  Mouth/Throat: Oropharynx is clear and moist.  Eyes: Conjunctivae are normal.  Neck: Neck supple.  Cardiovascular: Normal rate and regular rhythm.   Pulmonary/Chest: Effort normal and breath sounds normal. He has no wheezes. He has no rales.  Mild dry cough during exam  Skin: Skin is warm and dry.          Assessment & Plan:

## 2016-06-19 NOTE — Assessment & Plan Note (Signed)
Home readings stable, continue Amlodipine 10 mg.

## 2016-06-19 NOTE — Patient Instructions (Signed)
Continue the inhalers as discussed.  Continue montelukast (Singulair) tablets for allergies.  Continue omeprazole 20 mg tablets for acid reflux.  Continue amlodipine 10 mg instead of lisinopril 40 mg for high blood pressure.  We will check on you in 2 weeks, if no improvement in your cough then we can send you to a pulmonologist (lung specialist).   Continue exercising. You should be getting 150 minutes of moderate intensity exercise weekly.  It was a pleasure to see you today!

## 2016-06-19 NOTE — Assessment & Plan Note (Signed)
Sounds to be improved as he's now able to exercise without moderate SOB. Continue Laba/ICS.

## 2016-06-19 NOTE — Progress Notes (Signed)
Pre visit review using our clinic review tool, if applicable. No additional management support is needed unless otherwise documented below in the visit note. 

## 2016-06-29 DIAGNOSIS — M25512 Pain in left shoulder: Secondary | ICD-10-CM | POA: Diagnosis not present

## 2016-07-01 HISTORY — PX: SHOULDER SURGERY: SHX246

## 2016-07-03 ENCOUNTER — Telehealth: Payer: Self-pay | Admitting: Primary Care

## 2016-07-03 NOTE — Telephone Encounter (Addendum)
-----   Message from Pleas Koch, NP sent at 06/19/2016  9:17 AM EDT ----- Regarding: Cough Any additional improvement in cough?

## 2016-07-03 NOTE — Telephone Encounter (Signed)
Message left for patient to return my call.  

## 2016-07-04 DIAGNOSIS — M25512 Pain in left shoulder: Secondary | ICD-10-CM | POA: Diagnosis not present

## 2016-07-05 NOTE — Telephone Encounter (Signed)
Spoken to patient and he stated that his cough is better. Patient wanted [name] to know that he had MRI done and he has a tear in his left should also bone spurs.   He stated thank you asking.

## 2016-07-05 NOTE — Telephone Encounter (Signed)
Noted  

## 2016-07-26 DIAGNOSIS — S46012A Strain of muscle(s) and tendon(s) of the rotator cuff of left shoulder, initial encounter: Secondary | ICD-10-CM | POA: Diagnosis not present

## 2016-07-26 DIAGNOSIS — M19012 Primary osteoarthritis, left shoulder: Secondary | ICD-10-CM | POA: Diagnosis not present

## 2016-07-26 DIAGNOSIS — M7542 Impingement syndrome of left shoulder: Secondary | ICD-10-CM | POA: Diagnosis not present

## 2016-07-26 DIAGNOSIS — G8918 Other acute postprocedural pain: Secondary | ICD-10-CM | POA: Diagnosis not present

## 2016-07-26 DIAGNOSIS — M75122 Complete rotator cuff tear or rupture of left shoulder, not specified as traumatic: Secondary | ICD-10-CM | POA: Diagnosis not present

## 2016-07-26 DIAGNOSIS — M24112 Other articular cartilage disorders, left shoulder: Secondary | ICD-10-CM | POA: Diagnosis not present

## 2016-08-08 DIAGNOSIS — M7542 Impingement syndrome of left shoulder: Secondary | ICD-10-CM | POA: Diagnosis not present

## 2016-08-15 ENCOUNTER — Other Ambulatory Visit: Payer: Self-pay | Admitting: Primary Care

## 2016-08-15 DIAGNOSIS — I10 Essential (primary) hypertension: Secondary | ICD-10-CM

## 2016-08-15 MED ORDER — MONTELUKAST SODIUM 10 MG PO TABS: 10.0000 mg | ORAL_TABLET | Freq: Every day | ORAL | 1 refills | Status: DC

## 2016-08-15 MED ORDER — AMLODIPINE BESYLATE 10 MG PO TABS: 10.0000 mg | ORAL_TABLET | Freq: Every day | ORAL | 1 refills | Status: DC

## 2016-08-15 NOTE — Addendum Note (Signed)
Addended by: Jacqualin Combes on: 08/15/2016 03:58 PM   Modules accepted: Orders

## 2016-09-05 DIAGNOSIS — S46012D Strain of muscle(s) and tendon(s) of the rotator cuff of left shoulder, subsequent encounter: Secondary | ICD-10-CM | POA: Diagnosis not present

## 2016-09-10 DIAGNOSIS — S46012D Strain of muscle(s) and tendon(s) of the rotator cuff of left shoulder, subsequent encounter: Secondary | ICD-10-CM | POA: Diagnosis not present

## 2016-09-10 DIAGNOSIS — R531 Weakness: Secondary | ICD-10-CM | POA: Diagnosis not present

## 2016-09-10 DIAGNOSIS — M25512 Pain in left shoulder: Secondary | ICD-10-CM | POA: Diagnosis not present

## 2016-09-10 DIAGNOSIS — M25612 Stiffness of left shoulder, not elsewhere classified: Secondary | ICD-10-CM | POA: Diagnosis not present

## 2016-09-12 DIAGNOSIS — M25512 Pain in left shoulder: Secondary | ICD-10-CM | POA: Diagnosis not present

## 2016-09-12 DIAGNOSIS — S46012D Strain of muscle(s) and tendon(s) of the rotator cuff of left shoulder, subsequent encounter: Secondary | ICD-10-CM | POA: Diagnosis not present

## 2016-09-12 DIAGNOSIS — R531 Weakness: Secondary | ICD-10-CM | POA: Diagnosis not present

## 2016-09-12 DIAGNOSIS — M25612 Stiffness of left shoulder, not elsewhere classified: Secondary | ICD-10-CM | POA: Diagnosis not present

## 2016-09-17 DIAGNOSIS — M25612 Stiffness of left shoulder, not elsewhere classified: Secondary | ICD-10-CM | POA: Diagnosis not present

## 2016-09-17 DIAGNOSIS — S46012D Strain of muscle(s) and tendon(s) of the rotator cuff of left shoulder, subsequent encounter: Secondary | ICD-10-CM | POA: Diagnosis not present

## 2016-09-17 DIAGNOSIS — M25512 Pain in left shoulder: Secondary | ICD-10-CM | POA: Diagnosis not present

## 2016-09-17 DIAGNOSIS — R531 Weakness: Secondary | ICD-10-CM | POA: Diagnosis not present

## 2016-09-19 DIAGNOSIS — S46012D Strain of muscle(s) and tendon(s) of the rotator cuff of left shoulder, subsequent encounter: Secondary | ICD-10-CM | POA: Diagnosis not present

## 2016-09-19 DIAGNOSIS — M25512 Pain in left shoulder: Secondary | ICD-10-CM | POA: Diagnosis not present

## 2016-09-19 DIAGNOSIS — M25612 Stiffness of left shoulder, not elsewhere classified: Secondary | ICD-10-CM | POA: Diagnosis not present

## 2016-09-19 DIAGNOSIS — R531 Weakness: Secondary | ICD-10-CM | POA: Diagnosis not present

## 2016-09-20 ENCOUNTER — Telehealth: Payer: Self-pay | Admitting: Primary Care

## 2016-09-20 NOTE — Telephone Encounter (Signed)
Left pt message asking to call Amos back directly at [phone] to schedule AWV + labs with Lesia and CPE with PCP. °

## 2016-09-21 NOTE — Telephone Encounter (Signed)
Returned pt's call and left another message

## 2016-09-24 NOTE — Telephone Encounter (Signed)
Scheduled 10/12/16 °

## 2016-09-26 DIAGNOSIS — M25612 Stiffness of left shoulder, not elsewhere classified: Secondary | ICD-10-CM | POA: Diagnosis not present

## 2016-09-26 DIAGNOSIS — S46012D Strain of muscle(s) and tendon(s) of the rotator cuff of left shoulder, subsequent encounter: Secondary | ICD-10-CM | POA: Diagnosis not present

## 2016-09-26 DIAGNOSIS — R531 Weakness: Secondary | ICD-10-CM | POA: Diagnosis not present

## 2016-09-26 DIAGNOSIS — M25512 Pain in left shoulder: Secondary | ICD-10-CM | POA: Diagnosis not present

## 2016-09-28 DIAGNOSIS — R531 Weakness: Secondary | ICD-10-CM | POA: Diagnosis not present

## 2016-09-28 DIAGNOSIS — M25512 Pain in left shoulder: Secondary | ICD-10-CM | POA: Diagnosis not present

## 2016-09-28 DIAGNOSIS — M25612 Stiffness of left shoulder, not elsewhere classified: Secondary | ICD-10-CM | POA: Diagnosis not present

## 2016-09-28 DIAGNOSIS — S46012D Strain of muscle(s) and tendon(s) of the rotator cuff of left shoulder, subsequent encounter: Secondary | ICD-10-CM | POA: Diagnosis not present

## 2016-10-02 NOTE — Progress Notes (Signed)
Subjective:   Alan [name] is a 71 y.o. male who presents for Medicare Annual/Subsequent preventive examination.  Review of Systems:  No ROS.  Medicare Wellness Visit. Additional risk factors are reflected in the social history.  Cardiac Risk Factors include: advanced age (>60men, >32 women);dyslipidemia;hypertension;male gender;obesity (BMI >30kg/m2)     Objective:    Vitals: BP (!) 144/82   Pulse 86   Ht 5' 9.5" (1.765 m)   Wt 264 lb 8 oz (120 kg)   SpO2 98%   BMI 38.50 kg/m   Body mass index is 38.5 kg/m.  Tobacco History  Smoking Status  . Former Smoker  . Packs/day: 3.00  . Years: 25.00  . Quit date: 11/23/1996  Smokeless Tobacco  . Never Used     Counseling given: Not Answered   Past Medical History:  Diagnosis Date  . Allergic rhinitis, cause unspecified   . Carpal tunnel syndrome   . Chronic airway obstruction, not elsewhere classified   . Esophageal reflux   . Glaucoma (increased eye pressure)   . Lipoma of unspecified site   . Osteoarthrosis, unspecified whether generalized or localized, lower leg   . Other and unspecified hyperlipidemia   . Personal history of colonic polyps   . Personal history of other malignant neoplasm of skin   . Unspecified essential hypertension    Past Surgical History:  Procedure Laterality Date  . bone spur  06-2003   right thumb  . KNEE ARTHROSCOPY  09-2007   partial medial mesicecotmy, patellar chonfroplasty  . SHOULDER SURGERY  03-24-2007   removal bone spur  . SHOULDER SURGERY Left 07/2016   [place], Dr. Harmon Pier  . TOTAL KNEE ARTHROPLASTY     03-2009 [facility]  . TOTAL KNEE ARTHROPLASTY Left 12/2012   [facility]   Family History  Problem Relation Age of Onset  . Diabetes Mother   . Hyperlipidemia Mother   . Hypertension Mother   . Heart attack Mother   . Obesity Mother   . Hyperlipidemia Father   . Hypertension Father   . Lung cancer Father   . Colon polyps Father   . Hyperlipidemia  Brother   . Hypertension Brother   . Hyperlipidemia Brother   . Hypertension Brother   . Hypertension Sister   . Hyperlipidemia Sister   . Lung cancer Sister   . Hyperlipidemia Sister   . Drug abuse Daughter    History  Sexual Activity  . Sexual activity: Not Currently    Outpatient Encounter Prescriptions as of 10/12/2016  Medication Sig  . albuterol (PROVENTIL HFA;VENTOLIN HFA) 108 (90 Base) MCG/ACT inhaler Inhale 2 puffs into the lungs every 4 (four) hours as needed for wheezing or shortness of breath (cough, shortness of breath or wheezing.).  Marland Kitchen amLODipine (NORVASC) 10 MG tablet Take 1 tablet (10 mg total) by mouth daily.  Marland Kitchen aspirin 81 MG tablet Take 81 mg by mouth daily.    Marland Kitchen atorvastatin (LIPITOR) 40 MG tablet TAKE 1 TABLET BY MOUTH DAILY  . budesonide-formoterol (SYMBICORT) 160-4.5 MCG/ACT inhaler Inhale 2 puffs into the lungs 2 (two) times daily.  . fish oil-omega-3 fatty acids 1000 MG capsule Take 2 g by mouth daily.    . hydrochlorothiazide (HYDRODIURIL) 25 MG tablet Take 1 tablet (25 mg total) by mouth daily.  Marland Kitchen lisinopril (PRINIVIL,ZESTRIL) 40 MG tablet TAKE 1 TABLET BY MOUTH DAILY  . meloxicam (MOBIC) 15 MG tablet Take 1 tablet (15 mg total) by mouth daily.  . montelukast (SINGULAIR)  10 MG tablet Take 1 tablet (10 mg total) by mouth at bedtime.  . Multiple Vitamin (MULTIVITAMIN) tablet Take 1 tablet by mouth daily.    Marland Kitchen omeprazole (PRILOSEC) 20 MG capsule Take 20 mg by mouth daily.    . sertraline (ZOLOFT) 50 MG tablet TAKE 1 TABLET BY MOUTH DAILY  . triamcinolone cream (KENALOG) 0.1 % Apply 1 application topically 2 (two) times daily. (Patient taking differently: Apply 1 application topically 2 (two) times daily as needed. )  . [DISCONTINUED] sertraline (ZOLOFT) 50 MG tablet TAKE 1 TABLET BY MOUTH DAILY   No facility-administered encounter medications on file as of 10/12/2016.     Activities of Daily Living In your present state of health, do you have any difficulty  performing the following activities: 10/12/2016  Hearing? N  Vision? N  Difficulty concentrating or making decisions? N  Walking or climbing stairs? N  Dressing or bathing? N  Doing errands, shopping? N  Preparing Food and eating ? N  Using the Toilet? N  In the past six months, have you accidently leaked urine? Y  Do you have problems with loss of bowel control? N  Managing your Medications? N  Managing your Finances? N  Housekeeping or managing your Housekeeping? N  Some recent data might be hidden    Patient Care Team: Pleas Koch, NP as PCP - General (Nurse Practitioner) Warden Fillers, MD as Consulting Physician (Ophthalmology) Garrel Ridgel, [place] as Consulting Physician (Podiatry) Marchia Bond, MD as Consulting Physician (Orthopedic Surgery)   Assessment:    Physical assessment deferred to PCP.  Exercise Activities and Dietary recommendations Current Exercise Habits: Home exercise routine, Type of exercise: treadmill;Other - see comments (stationary bike), Time (Minutes): 60, Frequency (Times/Week): 3, Weekly Exercise (Minutes/Week): 180, Intensity: Moderate  Goals    . Increase physical activity (pt-stated)          Starting 10/12/2016, I will resume my previous exercise regimen of going to the gym 3 days weekly.      Fall Risk Fall Risk  10/12/2016 05/03/2014 04/27/2013  Falls in the past year? No No No   Depression Screen PHQ 2/9 Scores 10/12/2016 05/06/2015 05/03/2014 04/27/2013  PHQ - 2 Score 0 1 0 0    Cognitive Function PLEASE NOTE: A Mini-Cog screen was completed. Maximum score is 20. A value of 0 denotes this part of Folstein MMSE was not completed or the patient failed this part of the Mini-Cog screening.   Mini-Cog Screening Orientation to Time - Max 5 pts Orientation to Place - Max 5 pts Registration - Max 3 pts Recall - Max 3 pts Language Repeat - Max 1 pts Language Follow 3 Step Command - Max 3 pts      Mini-Cog - 10/12/16 0956    Normal  clock drawing test? yes   How many words correct? 1      MMSE - Mini Mental State Exam 10/12/2016  Orientation to time 5  Orientation to Place 5  Registration 3  Attention/ Calculation 0  Recall 1  Language- name 2 objects 0  Language- repeat 1  Language- follow 3 step command 3  Language- read & follow direction 0  Write a sentence 0  Copy design 0  Total score 18        Immunization History  Administered Date(s) Administered  . Influenza Split 03/02/2011, 02/28/2013  . Influenza Whole 01/24/2007, 01/06/2008, 02/02/2009  . Influenza,inj,Quad PF,36+ Mos 12/02/2015  . Influenza-Unspecified 03/22/2014, 01/05/2015  . Pneumococcal Conjugate-13  05/03/2014  . Pneumococcal Polysaccharide-23 12/05/2011  . Td 06/13/2006  . Zoster 09/03/2006   Screening Tests Health Maintenance  Topic Date Due  . TETANUS/TDAP  06/12/2016  . INFLUENZA VACCINE  10/31/2016  . COLONOSCOPY  08/05/2017  . Hepatitis C Screening  Completed  . PNA vac Low Risk Adult  Completed      Plan:    Follow-up w/ PCP as scheduled.  I have personally reviewed and noted the following in the patient's chart:   . Medical and social history . Use of alcohol, tobacco or illicit drugs  . Current medications and supplements . Functional ability and status . Nutritional status . Physical activity . Advanced directives . List of other physicians . Vitals . Screenings to include cognitive, depression, and falls . Referrals and appointments  In addition, I have reviewed and discussed with patient certain preventive protocols, quality metrics, and best practice recommendations. A written personalized care plan for preventive services as well as general preventive health recommendations were provided to patient.     Dorrene German, RN  10/12/2016

## 2016-10-02 NOTE — Progress Notes (Signed)
PCP notes:   Health maintenance: Tetanus due-patient to check w/ insurance regarding coverage.   Abnormal screenings:  Hearing-failed R ear at 4000 Hz. Mini-cog- 1/3 recall.   Patient concerns: None.   Nurse concerns: ? Cerumen impaction of R ear noted on hearing screening. Please check ears.   Next PCP appt: 10/19/2016 @ 10:15am

## 2016-10-08 ENCOUNTER — Other Ambulatory Visit: Payer: Self-pay | Admitting: Primary Care

## 2016-10-08 ENCOUNTER — Telehealth: Payer: Self-pay

## 2016-10-08 DIAGNOSIS — F32A Depression, unspecified: Secondary | ICD-10-CM

## 2016-10-08 DIAGNOSIS — F329 Major depressive disorder, single episode, unspecified: Secondary | ICD-10-CM

## 2016-10-08 DIAGNOSIS — E785 Hyperlipidemia, unspecified: Secondary | ICD-10-CM

## 2016-10-08 DIAGNOSIS — R7303 Prediabetes: Secondary | ICD-10-CM

## 2016-10-08 NOTE — Telephone Encounter (Signed)
Pt wants to know why lisinopril and sertraline were not refilled last week. I spoke with Bea at [facility] and pt picked up singulair and amlodipine on 10/04/16 and lisinopril and sertraline are ready for pick up. Pt voiced understanding and will ck with pharmacy.

## 2016-10-12 ENCOUNTER — Ambulatory Visit (INDEPENDENT_AMBULATORY_CARE_PROVIDER_SITE_OTHER): Payer: PPO

## 2016-10-12 ENCOUNTER — Other Ambulatory Visit (INDEPENDENT_AMBULATORY_CARE_PROVIDER_SITE_OTHER): Payer: PPO

## 2016-10-12 VITALS — BP 144/82 | HR 86 | Ht 69.5 in | Wt 264.5 lb

## 2016-10-12 DIAGNOSIS — E785 Hyperlipidemia, unspecified: Secondary | ICD-10-CM | POA: Diagnosis not present

## 2016-10-12 DIAGNOSIS — R7303 Prediabetes: Secondary | ICD-10-CM

## 2016-10-12 DIAGNOSIS — Z0000 Encounter for general adult medical examination without abnormal findings: Secondary | ICD-10-CM

## 2016-10-12 LAB — COMPREHENSIVE METABOLIC PANEL
ALT: 24 U/L (ref 0–53)
AST: 18 U/L (ref 0–37)
Albumin: 4.2 g/dL (ref 3.5–5.2)
Alkaline Phosphatase: 41 U/L (ref 39–117)
BILIRUBIN TOTAL: 0.6 mg/dL (ref 0.2–1.2)
BUN: 25 mg/dL — ABNORMAL HIGH (ref 6–23)
CO2: 26 meq/L (ref 19–32)
CREATININE: 0.89 mg/dL (ref 0.40–1.50)
Calcium: 9.7 mg/dL (ref 8.4–10.5)
Chloride: 102 mEq/L (ref 96–112)
GFR: 89.75 mL/min (ref >60.00)
GLUCOSE: 114 mg/dL — AB (ref 70–99)
Potassium: 4.4 mEq/L (ref 3.5–5.1)
Sodium: 138 mEq/L (ref 135–145)
Total Protein: 7 g/dL (ref 6.0–8.3)

## 2016-10-12 LAB — LDL CHOLESTEROL, DIRECT: LDL DIRECT: 218 mg/dL

## 2016-10-12 LAB — LIPID PANEL
Cholesterol: 319 mg/dL — ABNORMAL HIGH (ref 0–200)
HDL: 79.1 mg/dL (ref >39.00)
NONHDL: 239.87
Total CHOL/HDL Ratio: 4
Triglycerides: 253 mg/dL — ABNORMAL HIGH (ref 0.0–149.0)
VLDL: 50.6 mg/dL — ABNORMAL HIGH (ref 0.0–40.0)

## 2016-10-12 LAB — HEMOGLOBIN A1C: Hgb A1c MFr Bld: 5.7 % (ref 4.6–6.5)

## 2016-10-12 NOTE — Patient Instructions (Addendum)
Alan [name] , Thank you for taking time to come for your Medicare Wellness Visit. I appreciate your ongoing commitment to your health goals. Please review the following plan we discussed and let me know if I can assist you in the future.   These are the goals we discussed: Goals    . Increase physical activity (pt-stated)          Starting 10/12/2016, I will resume my previous exercise regimen of going to the gym 3 days weekly.       This is a list of the screening recommended for you and due dates:  Health Maintenance  Topic Date Due  . Tetanus Vaccine  06/12/2016  . Flu Shot  10/31/2016  . Colon Cancer Screening  08/05/2017  .  Hepatitis C: One time screening is recommended by Center for Disease Control  (CDC) for  adults born from [birth year] through 1964.   Completed  . Pneumonia vaccines  Completed   Preventive Care for Adults  A healthy lifestyle and preventive care can promote health and wellness. Preventive health guidelines for adults include the following key practices.  . A routine yearly physical is a good way to check with your health care provider about your health and preventive screening. It is a chance to share any concerns and updates on your health and to receive a thorough exam.  . Visit your dentist for a routine exam and preventive care every 6 months. Brush your teeth twice a day and floss once a day. Good oral hygiene prevents tooth decay and gum disease.  . The frequency of eye exams is based on your age, health, family medical history, use  of contact lenses, and other factors. Follow your health care provider's ecommendations for frequency of eye exams.  . Eat a healthy diet. Foods like vegetables, fruits, whole grains, low-fat dairy products, and lean protein foods contain the nutrients you need without too many calories. Decrease your intake of foods high in solid fats, added sugars, and salt. Eat the right amount of calories for you. Get information about a  proper diet from your health care provider, if necessary.  . Regular physical exercise is one of the most important things you can do for your health. Most adults should get at least 150 minutes of moderate-intensity exercise (any activity that increases your heart rate and causes you to sweat) each week. In addition, most adults need muscle-strengthening exercises on 2 or more days a week.  Silver Sneakers may be a benefit available to you. To determine eligibility, you may visit the website: www.silversneakers.com or contact program at [phone] Mon-Fri between 8AM-8PM.   . Maintain a healthy weight. The body mass index (BMI) is a screening tool to identify possible weight problems. It provides an estimate of body fat based on height and weight. Your health care provider can find your BMI and can help you achieve or maintain a healthy weight.   For adults 20 years and older: ? A BMI below 18.5 is considered underweight. ? A BMI of 18.5 to 24.9 is normal. ? A BMI of 25 to 29.9 is considered overweight. ? A BMI of 30 and above is considered obese.   . Maintain normal blood lipids and cholesterol levels by exercising and minimizing your intake of saturated fat. Eat a balanced diet with plenty of fruit and vegetables. Blood tests for lipids and cholesterol should begin at age 40 and be repeated every 5 years. If your lipid or cholesterol levels  are high, you are over 50, or you are at high risk for heart disease, you may need your cholesterol levels checked more frequently. Ongoing high lipid and cholesterol levels should be treated with medicines if diet and exercise are not working.  . If you smoke, find out from your health care provider how to quit. If you do not use tobacco, please do not start.  . If you choose to drink alcohol, please do not consume more than 2 drinks per day. One drink is considered to be 12 ounces (355 mL) of beer, 5 ounces (148 mL) of wine, or 1.5 ounces (44 mL) of  liquor.  . If you are 57-83 years old, ask your health care provider if you should take aspirin to prevent strokes.  . Use sunscreen. Apply sunscreen liberally and repeatedly throughout the day. You should seek shade when your shadow is shorter than you. Protect yourself by wearing long sleeves, pants, a wide-brimmed hat, and sunglasses year round, whenever you are outdoors.  . Once a month, do a whole body skin exam, using a mirror to look at the skin on your back. Tell your health care provider of new moles, moles that have irregular borders, moles that are larger than a pencil eraser, or moles that have changed in shape or color.

## 2016-10-12 NOTE — Progress Notes (Signed)
I reviewed health advisor's note, was available for consultation, and agree with documentation and plan.  

## 2016-10-19 ENCOUNTER — Encounter: Payer: Self-pay | Admitting: Primary Care

## 2016-10-19 ENCOUNTER — Ambulatory Visit (INDEPENDENT_AMBULATORY_CARE_PROVIDER_SITE_OTHER): Payer: PPO | Admitting: Primary Care

## 2016-10-19 VITALS — BP 130/86 | HR 69 | Temp 98.0°F | Ht 69.5 in | Wt 262.8 lb

## 2016-10-19 DIAGNOSIS — I10 Essential (primary) hypertension: Secondary | ICD-10-CM | POA: Diagnosis not present

## 2016-10-19 DIAGNOSIS — R7303 Prediabetes: Secondary | ICD-10-CM

## 2016-10-19 DIAGNOSIS — R053 Chronic cough: Secondary | ICD-10-CM

## 2016-10-19 DIAGNOSIS — J3089 Other allergic rhinitis: Secondary | ICD-10-CM | POA: Diagnosis not present

## 2016-10-19 DIAGNOSIS — R251 Tremor, unspecified: Secondary | ICD-10-CM | POA: Diagnosis not present

## 2016-10-19 DIAGNOSIS — E669 Obesity, unspecified: Secondary | ICD-10-CM

## 2016-10-19 DIAGNOSIS — E785 Hyperlipidemia, unspecified: Secondary | ICD-10-CM | POA: Diagnosis not present

## 2016-10-19 DIAGNOSIS — K219 Gastro-esophageal reflux disease without esophagitis: Secondary | ICD-10-CM

## 2016-10-19 DIAGNOSIS — R05 Cough: Secondary | ICD-10-CM

## 2016-10-19 DIAGNOSIS — H6122 Impacted cerumen, left ear: Secondary | ICD-10-CM | POA: Diagnosis not present

## 2016-10-19 DIAGNOSIS — J449 Chronic obstructive pulmonary disease, unspecified: Secondary | ICD-10-CM | POA: Diagnosis not present

## 2016-10-19 NOTE — Assessment & Plan Note (Signed)
Suspect he was not fasting during this lab draw, will have him also check his medication bottles at home to ensure he's taking atorvastatin. Never had a lipid panel like this in the past on atorvastatin. Will repeat Lipids in one month, stressed that he must be fasting.

## 2016-10-19 NOTE — Assessment & Plan Note (Signed)
Stable on singulair. Continue same.   

## 2016-10-19 NOTE — Progress Notes (Signed)
Subjective:    [name], male    DOB: [birth year], 71 y.o.   MRN: [id number]  HPI  Mr. Girtman is a 71 year old male who presents today for [place] Part 2. He saw our health advisor last week.  1) COPD/Chronic cough: Currently managed on Symbicort 160-4.5 mg daily and albuterol PRN. He's also managed on Singulair and omeprazole for cough/allergies/GERD.  He denies cough. He's using his Symbicort daily, not had to use the albuterol inhaler. He thinks he's been taking lisinopril, not sure. He denies chest pain, shortness of breath.   2) Essential Hypertension: Currently managed on amlodipine 10 mg, lisinopril 40 mg, HCTZ 25 mg. His lisinopril was held in early 2018 due to recurrent cough. He thinks he's been taking lisinopril. He's not checking his BP at home.   3) Depression: Currently managed on sertraline 50 mg. He feels well managed on this regimen, denies SI/HI.  4) Osteoarthritis: Completed rotator cuff surgery to let shoulder 11 weeks ago. He's not taken anything for his pain. Overall doing well, denies pain.  5) Tremors: Located to bilateral hands for the past 1 year, gradually getting worse and believes it's worse over the last 6 months. Overall this doesn't bother his daily ADL's. He denies weakness, numbness/tingling.   6) Hyperlipidemia: Currently managed on atorvastatin 40 mg, Fish Oil 1000 mg. He denies myalgias. Recent TC of 319, Trigs 253, LDL 218. He thinks he was fasting. He endorses compliance to his atorvastatin for which he's taken for years.  Wt Readings from Last 3 Encounters:  10/19/16 262 lb 12.8 oz (119.2 kg)  10/12/16 264 lb 8 oz (120 kg)  06/19/16 261 lb (118.4 kg)     Review of Systems  Constitutional: Negative for unexpected weight change.  HENT: Negative for rhinorrhea.   Respiratory: Negative for cough and shortness of breath.   Cardiovascular: Negative for chest pain.  Gastrointestinal: Negative for constipation and diarrhea.    Genitourinary: Negative for difficulty urinating.  Musculoskeletal: Negative for arthralgias and myalgias.  Skin: Negative for rash.  Allergic/Immunologic: Negative for environmental allergies.  Neurological: Positive for tremors. Negative for dizziness, numbness and headaches.  Psychiatric/Behavioral:       Feels well managed on Zoloft.       Past Medical History:  Diagnosis Date  . Allergic rhinitis, cause unspecified   . Carpal tunnel syndrome   . Chronic airway obstruction, not elsewhere classified   . Esophageal reflux   . Glaucoma (increased eye pressure)   . Lipoma of unspecified site   . Osteoarthrosis, unspecified whether generalized or localized, lower leg   . Other and unspecified hyperlipidemia   . Personal history of colonic polyps   . Personal history of other malignant neoplasm of skin   . Unspecified essential hypertension      Social History   Social History  . Marital status: Married    Spouse name: N/A  . Number of children: 2  . Years of education: N/A   Occupational History  . [employer]    Social History Main Topics  . Smoking status: Former Smoker    Packs/day: 3.00    Years: 25.00    Quit date: 11/23/1996  . Smokeless tobacco: Never Used  . Alcohol use 0.0 oz/week     Comment: 4 mixed drinks per week  . Drug use: No  . Sexual activity: Not Currently   Other Topics Concern  . Not on file   Social History Narrative  Regular exercise -- no             Past Surgical History:  Procedure Laterality Date  . bone spur  06-2003   right thumb  . KNEE ARTHROSCOPY  09-2007   partial medial mesicecotmy, patellar chonfroplasty  . SHOULDER SURGERY  03-24-2007   removal bone spur  . SHOULDER SURGERY Left 07/2016   [place], Dr. Harmon Pier  . TOTAL KNEE ARTHROPLASTY     03-2009 [facility]  . TOTAL KNEE ARTHROPLASTY Left 12/2012   [facility]    Family History  Problem Relation Age of Onset  . Diabetes Mother    . Hyperlipidemia Mother   . Hypertension Mother   . Heart attack Mother   . Obesity Mother   . Hyperlipidemia Father   . Hypertension Father   . Lung cancer Father   . Colon polyps Father   . Hyperlipidemia Brother   . Hypertension Brother   . Hyperlipidemia Brother   . Hypertension Brother   . Hypertension Sister   . Hyperlipidemia Sister   . Lung cancer Sister   . Hyperlipidemia Sister   . Drug abuse Daughter     No Known Allergies  Current Outpatient Prescriptions on File Prior to Visit  Medication Sig Dispense Refill  . albuterol (PROVENTIL HFA;VENTOLIN HFA) 108 (90 Base) MCG/ACT inhaler Inhale 2 puffs into the lungs every 4 (four) hours as needed for wheezing or shortness of breath (cough, shortness of breath or wheezing.). 1 Inhaler 1  . amLODipine (NORVASC) 10 MG tablet Take 1 tablet (10 mg total) by mouth daily. 90 tablet 1  . aspirin 81 MG tablet Take 81 mg by mouth daily.      Marland Kitchen atorvastatin (LIPITOR) 40 MG tablet TAKE 1 TABLET BY MOUTH DAILY 90 tablet 3  . budesonide-formoterol (SYMBICORT) 160-4.5 MCG/ACT inhaler Inhale 2 puffs into the lungs 2 (two) times daily. 1 Inhaler 3  . fish oil-omega-3 fatty acids 1000 MG capsule Take 2 g by mouth daily.      . hydrochlorothiazide (HYDRODIURIL) 25 MG tablet Take 1 tablet (25 mg total) by mouth daily. 90 tablet 0  . lisinopril (PRINIVIL,ZESTRIL) 40 MG tablet TAKE 1 TABLET BY MOUTH DAILY 90 tablet 1  . meloxicam (MOBIC) 15 MG tablet Take 1 tablet (15 mg total) by mouth daily. 30 tablet 0  . montelukast (SINGULAIR) 10 MG tablet Take 1 tablet (10 mg total) by mouth at bedtime. 90 tablet 1  . Multiple Vitamin (MULTIVITAMIN) tablet Take 1 tablet by mouth daily.      Marland Kitchen omeprazole (PRILOSEC) 20 MG capsule Take 20 mg by mouth daily.      . sertraline (ZOLOFT) 50 MG tablet TAKE 1 TABLET BY MOUTH DAILY 90 tablet 1  . triamcinolone cream (KENALOG) 0.1 % Apply 1 application topically 2 (two) times daily. (Patient not taking: Reported on  10/19/2016) 30 g 0   No current facility-administered medications on file prior to visit.     BP (!) 146/90   Pulse 69   Temp 98 F (36.7 C) (Oral)   Ht 5' 9.5" (1.765 m)   Wt 262 lb 12.8 oz (119.2 kg)   SpO2 97%   BMI 38.25 kg/m    Objective:   Physical Exam  Constitutional: He is oriented to person, place, and time. He appears well-nourished.  HENT:  Right Ear: Tympanic membrane and ear canal normal.  Left Ear: Tympanic membrane and ear canal normal.  Nose: Nose normal. Right sinus exhibits no maxillary  sinus tenderness and no frontal sinus tenderness. Left sinus exhibits no maxillary sinus tenderness and no frontal sinus tenderness.  Mouth/Throat: Oropharynx is clear and moist.  Left ear canal with cerumen impaction, canal and TM unremarkable post irrigation.  Eyes: Pupils are equal, round, and reactive to light. Conjunctivae and EOM are normal.  Neck: Neck supple. Carotid bruit is not present. No thyromegaly present.  Cardiovascular: Normal rate, regular rhythm and normal heart sounds.   Pulmonary/Chest: Effort normal and breath sounds normal. He has no wheezes. He has no rales.  Abdominal: Soft. Bowel sounds are normal. There is no tenderness.  Musculoskeletal: Normal range of motion.  Neurological: He is alert and oriented to person, place, and time. He has normal reflexes. No cranial nerve deficit.  Resting tremors noted to bilateral hands, more noticeable to left hand.  Skin: Skin is warm and dry.  Psychiatric: He has a normal mood and affect.          Assessment & Plan:

## 2016-10-19 NOTE — Assessment & Plan Note (Signed)
Resolved. Suspect COPD to be cause. Continue Symbicort, PPI, Singulair. He has been taking lisinopril, continue this for now.

## 2016-10-19 NOTE — Assessment & Plan Note (Addendum)
Above goal in the office which is typical, improved on recheck. Will have him start monitor home readings and notify of readings at or above 140/90. Continue lisinopril 40 mg, HCTZ 25 mg, Amlodipine 10 mg. Will have him message [place] with a list of the medication bottles at home as he's not sure what he's taking.

## 2016-10-19 NOTE — Assessment & Plan Note (Signed)
Stable on sertraline, continue same.  ?

## 2016-10-19 NOTE — Assessment & Plan Note (Signed)
Discussed the importance of a healthy diet and regular exercise in order for weight loss, and to reduce the risk of other medical problems.  

## 2016-10-19 NOTE — Patient Instructions (Addendum)
Start exercising. You should be getting 150 minutes of moderate intensity exercise weekly.  It's important to improve your diet by reducing consumption of fast food, fried food, processed snack foods, sugary drinks. Increase consumption of fresh vegetables and fruits, whole grains, water.  Ensure you are drinking 64 ounces of water daily.  Monitor your blood pressure at home and call me if you get consistent readings at or above 140/90.  Please message me a list through My Chart of all of your medications that you take on a daily basis.  Continue atorvastatin 40 mg tablets for cholesterol for now. I think this lab is possibly wrong.  Schedule a lab only appointment for cholesterol recheck in 1 month. No food or drinks 8 hours prior to this appointment. You may have water and black coffee.  Follow up in 6 months for re-evaluation or sooner if needed. It was a pleasure to see you today!    Cholesterol Cholesterol is a fat. Your body needs a small amount of cholesterol. Cholesterol (plaque) may build up in your blood vessels (arteries). That makes you more likely to have a heart attack or stroke. You cannot feel your cholesterol level. Having a blood test is the only way to find out if your level is high. Keep your test results. Work with your doctor to keep your cholesterol at a good level. What do the results mean?  Total cholesterol is how much cholesterol is in your blood.  LDL is bad cholesterol. This is the type that can build up. Try to have low LDL.  HDL is good cholesterol. It cleans your blood vessels and carries LDL away. Try to have high HDL.  Triglycerides are fat that the body can store or burn for energy. What are good levels of cholesterol?  Total cholesterol below 200.  LDL below 100 is good for people who have health risks. LDL below 70 is good for people who have very high risks.  HDL above 40 is good. It is best to have HDL of 60 or higher.  Triglycerides below  150. How can I lower my cholesterol? Diet Follow your diet program as told by your doctor.  Choose fish, white meat chicken, or [place] that is roasted or baked. Try not to eat red meat, fried foods, sausage, or lunch meats.  Eat lots of fresh fruits and vegetables.  Choose whole grains, beans, pasta, potatoes, and cereals.  Choose olive oil, corn oil, or canola oil. Only use small amounts.  Try not to eat butter, mayonnaise, shortening, or palm kernel oils.  Try not to eat foods with trans fats.  Choose low-fat or nonfat dairy foods. ? Drink skim or nonfat milk. ? Eat low-fat or nonfat yogurt and cheeses. ? Try not to drink whole milk or cream. ? Try not to eat ice cream, egg yolks, or full-fat cheeses.  Healthy desserts include angel food cake, ginger snaps, animal crackers, hard candy, popsicles, and low-fat or nonfat frozen yogurt. Try not to eat pastries, cakes, pies, and cookies.  Exercise Follow your exercise program as told by your doctor.  Be more active. Try gardening, walking, and taking the stairs.  Ask your doctor about ways that you can be more active.  Medicine  Take over-the-counter and prescription medicines only as told by your doctor.  This information is not intended to replace advice given to you by your health care provider. Make sure you discuss any questions you have with your health care provider. Document Released:  06/15/2008 Document Revised: 10/19/2015 Document Reviewed: 09/29/2015 Elsevier Interactive Patient Education  2017 [employer].

## 2016-10-19 NOTE — Assessment & Plan Note (Signed)
Continues to remain stable. Discussed the importance of a healthy diet and regular exercise in order for weight loss, and to reduce the risk of other medical problems.

## 2016-10-19 NOTE — Assessment & Plan Note (Signed)
Stable on PPI, continues same. Cough under control.

## 2016-10-19 NOTE — Assessment & Plan Note (Signed)
Appear to be essential, worse over the last 6 months. Bilateral, resting. He has no complaints of tremors as they do not affect his daily life. Discussed options including neurology evaluation, he will monitor symptoms and notify if symptoms progress.

## 2016-10-19 NOTE — Assessment & Plan Note (Signed)
Cough has resolved. Compliant to daily Symbicort, not used albuterol. Continue current regimen.

## 2016-10-21 MED ORDER — ATORVASTATIN CALCIUM 40 MG PO TABS: 40.0000 mg | ORAL_TABLET | Freq: Every evening | ORAL | 3 refills | Status: DC

## 2016-10-24 DIAGNOSIS — H25013 Cortical age-related cataract, bilateral: Secondary | ICD-10-CM | POA: Diagnosis not present

## 2016-10-24 DIAGNOSIS — H25813 Combined forms of age-related cataract, bilateral: Secondary | ICD-10-CM | POA: Diagnosis not present

## 2016-10-24 DIAGNOSIS — H40013 Open angle with borderline findings, low risk, bilateral: Secondary | ICD-10-CM | POA: Diagnosis not present

## 2016-10-25 ENCOUNTER — Encounter: Payer: Self-pay | Admitting: Primary Care

## 2016-10-26 ENCOUNTER — Other Ambulatory Visit: Payer: Self-pay | Admitting: Optometry

## 2016-10-26 ENCOUNTER — Other Ambulatory Visit: Payer: Self-pay | Admitting: Ophthalmology

## 2016-10-26 DIAGNOSIS — H53461 Homonymous bilateral field defects, right side: Secondary | ICD-10-CM

## 2016-10-26 NOTE — Telephone Encounter (Signed)
Spoke with patient regarding his concerns. His ophthalmologist thinks he had a stroke years ago. His recent eye exam was the same as his exam in 2016. He denies symptoms of visual changes, weakness, numbness/tingling, dizziness. Will have him start aspirin 81 mg daily, recently restarted his atorvastatin. Will gain control over lipids, recheck in 4 weeks.

## 2016-11-08 DIAGNOSIS — H2512 Age-related nuclear cataract, left eye: Secondary | ICD-10-CM | POA: Diagnosis not present

## 2016-11-08 DIAGNOSIS — H268 Other specified cataract: Secondary | ICD-10-CM | POA: Diagnosis not present

## 2016-11-16 ENCOUNTER — Other Ambulatory Visit (INDEPENDENT_AMBULATORY_CARE_PROVIDER_SITE_OTHER): Payer: PPO

## 2016-11-16 DIAGNOSIS — E785 Hyperlipidemia, unspecified: Secondary | ICD-10-CM | POA: Diagnosis not present

## 2016-11-16 LAB — LIPID PANEL
Cholesterol: 221 mg/dL — ABNORMAL HIGH (ref 0–200)
HDL: 82.5 mg/dL (ref >39.00)
LDL Cholesterol: 108 mg/dL — ABNORMAL HIGH (ref 0–99)
NonHDL: 138.31
Total CHOL/HDL Ratio: 3
Triglycerides: 154 mg/dL — ABNORMAL HIGH (ref 0.0–149.0)
VLDL: 30.8 mg/dL (ref 0.0–40.0)

## 2016-12-21 DIAGNOSIS — M25512 Pain in left shoulder: Secondary | ICD-10-CM | POA: Diagnosis not present

## 2017-02-13 ENCOUNTER — Ambulatory Visit: Payer: PPO | Admitting: Primary Care

## 2017-02-13 ENCOUNTER — Encounter: Payer: Self-pay | Admitting: Primary Care

## 2017-02-13 VITALS — BP 126/80 | HR 81 | Temp 98.5°F | Ht 69.5 in | Wt 263.1 lb

## 2017-02-13 DIAGNOSIS — J069 Acute upper respiratory infection, unspecified: Secondary | ICD-10-CM

## 2017-02-13 MED ORDER — BENZONATATE 200 MG PO CAPS: 200.0000 mg | ORAL_CAPSULE | Freq: Three times a day (TID) | ORAL | 0 refills | Status: DC | PRN

## 2017-02-13 NOTE — Patient Instructions (Signed)
You may take Benzonatate capsules for cough. Take 1 capsule by mouth three times daily as needed for cough.  Continue your Symbicort inhaler everyday as prescribed.  You can use your albuterol inhaler every 4-6 hours as needed for cough. Do this for about one week.  Continue montelukast (Singulair) once daily for allergies.  Please call me early next week if no improvement in your cough.  It was a pleasure to see you today!

## 2017-02-13 NOTE — Progress Notes (Signed)
Subjective:    [name], male    DOB: 1945-09-08, 71 y.o.   MRN: [id number]  HPI  Alan [name] is a 71 year old male with a history of chronic cough, hypertension, allergic rhinitis and COPD who presents today with a chief complaint of cough. He also reports rhinorrhea. Symptoms began 3-4 weeks ago. He took eight tablets of Amoxil from an older prescription and Mucinex when symptoms began and felt improved. Since then he's continued to experience a non productive cough with rhinorrhea. Overall he's feeling better except for the lingering cough.  He denies fevers. He has a history of chronic cough that has not been problematic since his last visit, up until 3-4 weeks ago. He's compliant to his Symbicort everyday, not using his albuterol much.  Review of Systems  Constitutional: Negative for chills, fatigue and fever.  HENT: Positive for rhinorrhea. Negative for congestion, sinus pressure and sore throat.   Respiratory: Positive for cough. Negative for shortness of breath and wheezing.   Cardiovascular: Negative for chest pain.       Past Medical History:  Diagnosis Date  . Allergic rhinitis, cause unspecified   . Carpal tunnel syndrome   . Chronic airway obstruction, not elsewhere classified   . Esophageal reflux   . Glaucoma (increased eye pressure)   . Lipoma of unspecified site   . Osteoarthrosis, unspecified whether generalized or localized, lower leg   . Other and unspecified hyperlipidemia   . Personal history of colonic polyps   . Personal history of other malignant neoplasm of skin   . Unspecified essential hypertension      Social History   Socioeconomic History  . Marital status: Married    Spouse name: Not on file  . Number of children: 2  . Years of education: Not on file  . Highest education level: Not on file  Social Needs  . Financial resource strain: Not on file  . Food insecurity - worry: Not on file  . Food insecurity - inability: Not on  file  . Transportation needs - medical: Not on file  . Transportation needs - non-medical: Not on file  Occupational History  . 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 Smoking status: Former Smoker    Packs/day: 3.00    Years: 25.00    Pack years: 75.00    Last attempt to quit: 11/23/1996    Years since quitting: 20.2  . Smokeless tobacco: Never Used  Substance and Sexual Activity  . Alcohol use: Yes    Alcohol/week: 0.0 oz    Comment: 4 mixed drinks per week  . Drug use: No  . Sexual activity: Not Currently  Other Topics Concern  . Not on file  Social History Narrative   Regular exercise -- no             Past Surgical History:  Procedure Laterality Date  . bone spur  06-2003   right thumb  . KNEE ARTHROSCOPY  09-2007   partial medial mesicecotmy, patellar chonfroplasty  . SHOULDER SURGERY  03-24-2007   removal bone spur  . SHOULDER SURGERY Left 07/2016   [place], Dr. Harmon Pier  . TOTAL KNEE ARTHROPLASTY     03-2009 [facility]  . TOTAL KNEE ARTHROPLASTY Left 12/2012   [facility]    Family History  Problem Relation Age of Onset  . Diabetes Mother   . Hyperlipidemia Mother   . Hypertension Mother   . Heart attack Mother   .  Obesity Mother   . Hyperlipidemia Father   . Hypertension Father   . Lung cancer Father   . Colon polyps Father   . Hyperlipidemia Brother   . Hypertension Brother   . Hyperlipidemia Brother   . Hypertension Brother   . Hypertension Sister   . Hyperlipidemia Sister   . Lung cancer Sister   . Hyperlipidemia Sister   . Drug abuse Daughter     No Known Allergies  Current Outpatient Medications on File Prior to Visit  Medication Sig Dispense Refill  . albuterol (PROVENTIL HFA;VENTOLIN HFA) 108 (90 Base) MCG/ACT inhaler Inhale 2 puffs into the lungs every 4 (four) hours as needed for wheezing or shortness of breath (cough, shortness of breath or wheezing.). 1 Inhaler 1  . amLODipine (NORVASC) 10 MG tablet  Take 1 tablet (10 mg total) by mouth daily. 90 tablet 1  . aspirin 81 MG tablet Take 81 mg by mouth daily.      Marland Kitchen atorvastatin (LIPITOR) 40 MG tablet Take 1 tablet (40 mg total) by mouth every evening. 90 tablet 3  . budesonide-formoterol (SYMBICORT) 160-4.5 MCG/ACT inhaler Inhale 2 puffs into the lungs 2 (two) times daily. 1 Inhaler 3  . fish oil-omega-3 fatty acids 1000 MG capsule Take 2 g by mouth daily.      Marland Kitchen lisinopril (PRINIVIL,ZESTRIL) 40 MG tablet TAKE 1 TABLET BY MOUTH DAILY 90 tablet 1  . meloxicam (MOBIC) 15 MG tablet Take 1 tablet (15 mg total) by mouth daily. 30 tablet 0  . montelukast (SINGULAIR) 10 MG tablet Take 1 tablet (10 mg total) by mouth at bedtime. 90 tablet 1  . Multiple Vitamin (MULTIVITAMIN) tablet Take 1 tablet by mouth daily.      Marland Kitchen omeprazole (PRILOSEC) 20 MG capsule Take 20 mg by mouth daily.      . sertraline (ZOLOFT) 50 MG tablet TAKE 1 TABLET BY MOUTH DAILY 90 tablet 1  . triamcinolone cream (KENALOG) 0.1 % Apply 1 application topically 2 (two) times daily. 30 g 0   No current facility-administered medications on file prior to visit.     BP 126/80   Pulse 81   Temp 98.5 F (36.9 C) (Oral)   Ht 5' 9.5" (1.765 m)   Wt 263 lb 1.9 oz (119.4 kg)   SpO2 97%   BMI 38.30 kg/m    Objective:   Physical Exam  Constitutional: He appears well-nourished.  HENT:  Right Ear: Tympanic membrane and ear canal normal.  Left Ear: Tympanic membrane and ear canal normal.  Nose: No mucosal edema. Right sinus exhibits no maxillary sinus tenderness and no frontal sinus tenderness. Left sinus exhibits no maxillary sinus tenderness and no frontal sinus tenderness.  Mouth/Throat: Oropharynx is clear and moist.  Eyes: Conjunctivae are normal.  Neck: Neck supple.  Cardiovascular: Normal rate and regular rhythm.  Pulmonary/Chest: Effort normal and breath sounds normal. He has no wheezes. He has no rales.  Skin: Skin is warm and dry.          Assessment & Plan:  Viral  Bronchitis:  Cough, rhinorrhea x 3-4 weeks. Feeling better after a short course of self administered Amoxil.  Exam today with clear lungs, normal vitals, doesn't appear ill. Suspect post viral bronchitic cough. Rx for Alan [name] sent to pharmacy. Instructed to use albuterol inhaler every 4-6 hours as needed for cough for the next week. Consider Zpak if no improvement by early next week, he will update.  Sheral Flow, NP

## 2017-03-18 ENCOUNTER — Telehealth: Payer: Self-pay | Admitting: Primary Care

## 2017-03-18 DIAGNOSIS — I10 Essential (primary) hypertension: Secondary | ICD-10-CM

## 2017-03-18 DIAGNOSIS — F329 Major depressive disorder, single episode, unspecified: Secondary | ICD-10-CM

## 2017-03-18 DIAGNOSIS — F32A Depression, unspecified: Secondary | ICD-10-CM

## 2017-03-18 DIAGNOSIS — E785 Hyperlipidemia, unspecified: Secondary | ICD-10-CM

## 2017-03-18 NOTE — Telephone Encounter (Signed)
Change of insurance information

## 2017-03-18 NOTE — Telephone Encounter (Signed)
Copied from [place] [id number]. Topic: Quick Communication - See Telephone Encounter >> Mar 18, 2017 10:31 AM Boyd Kerbs wrote: CRM for notification. See Telephone encounter for:  Patient is switching insurance as of 04/02/17 to  [facility] and they informed him to give ID # [id number] Rx for prescriptions as of Jan. 1 Fax # [phone] Patient is requesting to send in mail order prescriptions to [facility] to have filled right after 1st year. And there is no delay in getting them. 2 - blood pressure medications (amLodipine 10mg ,  & Lifinotril 40mg ), Atorzastatin Calcuim 40mg , & Sertralinehcl 50mg    Patient is asking to send him a confirmation of all of this on My Chart.  03/18/17.

## 2017-03-21 NOTE — Telephone Encounter (Signed)
Noted  

## 2017-04-08 ENCOUNTER — Encounter: Payer: Self-pay | Admitting: Primary Care

## 2017-04-08 MED ORDER — SERTRALINE HCL 50 MG PO TABS: 50.0000 mg | ORAL_TABLET | Freq: Every day | ORAL | 1 refills | Status: DC

## 2017-04-08 MED ORDER — AMLODIPINE BESYLATE 10 MG PO TABS: 10.0000 mg | ORAL_TABLET | Freq: Every day | ORAL | 1 refills | Status: DC

## 2017-04-08 MED ORDER — ATORVASTATIN CALCIUM 40 MG PO TABS: 40.0000 mg | ORAL_TABLET | Freq: Every evening | ORAL | 3 refills | Status: DC

## 2017-04-08 MED ORDER — LISINOPRIL 40 MG PO TABS: 40.0000 mg | ORAL_TABLET | Freq: Every day | ORAL | 1 refills | Status: DC

## 2017-04-08 NOTE — Addendum Note (Signed)
Addended by: Jacqualin Combes on: 04/08/2017 05:01 PM   Modules accepted: Orders

## 2017-04-19 ENCOUNTER — Encounter: Payer: Self-pay | Admitting: Primary Care

## 2017-04-19 ENCOUNTER — Ambulatory Visit (INDEPENDENT_AMBULATORY_CARE_PROVIDER_SITE_OTHER): Payer: Medicare HMO | Admitting: Primary Care

## 2017-04-19 VITALS — BP 140/78 | HR 68 | Temp 98.2°F | Ht 69.5 in | Wt 263.4 lb

## 2017-04-19 DIAGNOSIS — R251 Tremor, unspecified: Secondary | ICD-10-CM | POA: Insufficient documentation

## 2017-04-19 DIAGNOSIS — J3089 Other allergic rhinitis: Secondary | ICD-10-CM | POA: Diagnosis not present

## 2017-04-19 DIAGNOSIS — I10 Essential (primary) hypertension: Secondary | ICD-10-CM | POA: Diagnosis not present

## 2017-04-19 DIAGNOSIS — R69 Illness, unspecified: Secondary | ICD-10-CM | POA: Diagnosis not present

## 2017-04-19 DIAGNOSIS — J449 Chronic obstructive pulmonary disease, unspecified: Secondary | ICD-10-CM | POA: Diagnosis not present

## 2017-04-19 DIAGNOSIS — R05 Cough: Secondary | ICD-10-CM

## 2017-04-19 DIAGNOSIS — E785 Hyperlipidemia, unspecified: Secondary | ICD-10-CM | POA: Diagnosis not present

## 2017-04-19 DIAGNOSIS — R053 Chronic cough: Secondary | ICD-10-CM

## 2017-04-19 DIAGNOSIS — R7303 Prediabetes: Secondary | ICD-10-CM

## 2017-04-19 DIAGNOSIS — F3342 Major depressive disorder, recurrent, in full remission: Secondary | ICD-10-CM | POA: Diagnosis not present

## 2017-04-19 LAB — CBC
HCT: 38.7 % — ABNORMAL LOW (ref 39.0–52.0)
HEMOGLOBIN: 13.1 g/dL (ref 13.0–17.0)
MCHC: 33.8 g/dL (ref 30.0–36.0)
MCV: 92.6 fl (ref 78.0–100.0)
Platelets: 160 10*3/uL (ref 150.0–400.0)
RBC: 4.18 Mil/uL — ABNORMAL LOW (ref 4.22–5.81)
RDW: 13.1 % (ref 11.5–15.5)
WBC: 6 10*3/uL (ref 4.0–10.5)

## 2017-04-19 LAB — VITAMIN B12: Vitamin B-12: 371 pg/mL (ref 211–911)

## 2017-04-19 LAB — LIPID PANEL
Cholesterol: 227 mg/dL — ABNORMAL HIGH (ref 0–200)
HDL: 83.8 mg/dL (ref >39.00)
NonHDL: 142.98
TRIGLYCERIDES: 232 mg/dL — AB (ref 0.0–149.0)
Total CHOL/HDL Ratio: 3
VLDL: 46.4 mg/dL — ABNORMAL HIGH (ref 0.0–40.0)

## 2017-04-19 LAB — LDL CHOLESTEROL, DIRECT: LDL DIRECT: 119 mg/dL

## 2017-04-19 LAB — TSH: TSH: 1.64 u[IU]/mL (ref 0.35–4.50)

## 2017-04-19 LAB — HEMOGLOBIN A1C: HEMOGLOBIN A1C: 5.8 % (ref 4.6–6.5)

## 2017-04-19 NOTE — Assessment & Plan Note (Signed)
Doing well on Zoloft, continue same. 

## 2017-04-19 NOTE — Patient Instructions (Addendum)
Stop by the lab prior to leaving today. I will notify you of your results once received.   Continue exercising. You should be getting 150 minutes of moderate intensity exercise weekly.  Increase vegetables, fruit, whole grains, water.  Ensure you are consuming 64 ounces of water daily.  Schedule your Medicare Wellness Visit and physical in 6 months.  It was a pleasure to see you today!

## 2017-04-19 NOTE — Assessment & Plan Note (Signed)
Slightly above goal in the office today, stable on home readings. Continue lisinopril 40 mg and amlodipine 10 mg.

## 2017-04-19 NOTE — Assessment & Plan Note (Signed)
No cough. Continue Symbicort daily and albuterol PRN. No wheezing or rales on exam.

## 2017-04-19 NOTE — Assessment & Plan Note (Signed)
States that he's noticed since surgery 6 months ago, seems to be getting worse. He declines neurology evaluation today and will continue to monitor. Resting tremors to bilateral hands present.

## 2017-04-19 NOTE — Assessment & Plan Note (Signed)
Compliant to atorvastatin, continue same.  °Repeat lipid panel pending. °

## 2017-04-19 NOTE — Assessment & Plan Note (Signed)
Repeat A1C today given recent tremors.

## 2017-04-19 NOTE — Assessment & Plan Note (Signed)
No recent cough. Continue Symbicort and Singulair.

## 2017-04-19 NOTE — Assessment & Plan Note (Signed)
Overall doing well, continue singulair.

## 2017-04-19 NOTE — Progress Notes (Signed)
Subjective:    [name], male    DOB: 03/31/1946, 72 y.o.   MRN: [id number]  HPI  1) Chronic Cough/COPD: Currently managed on Symbicort and albuterol inhaler. Also managed on Singulair 10 mg. He's using his albuterol inhaler infrequently, no recent use.   2) Essential Hypertension: Currently managed on amlodipine 10 mg, lisinopril 40 mg. He is a little stressed this morning given terrible traffic. He's checking his BP at home and is getting readings of 120-130/70's-80's on average. He denies chest pain, shortness of breath, dizziness.   BP Readings from Last 3 Encounters:  04/19/17 140/78  02/13/17 126/80  10/19/16 130/86     3) Depression: Currently managed on sertraline 50 mg. Overall feels well managed. Denies SI/HI.  4) Tremors: Located to bilateral hands and left upper arm since his left sided shoulder surgery 6 months ago. He's not notified the orthopedist. He denies unilateral weakness, numbness/tingling, changes in speech, fatigue. He continues to experience left shoulder pain.   5) Hyperlipidemia: Currently managed on atorvastatin 40 mg and endorses compliance.  Diet currently consists of:  Breakfast: [place] patty, toast Lunch: Skips Dinner: Salad, vegetables, deer meat, potatoes  Snacks: None Desserts: Occasionally  Beverages: Coffee  Exercise: He goes to the gym 1-3 times weekly on average.   Review of Systems  Constitutional: Negative for fatigue.  Respiratory: Negative for cough and shortness of breath.   Cardiovascular: Negative for chest pain.  Musculoskeletal: Positive for arthralgias.  Allergic/Immunologic: Positive for environmental allergies.  Neurological: Positive for tremors. Negative for dizziness, facial asymmetry, speech difficulty, weakness, light-headedness and headaches.       Past Medical History:  Diagnosis Date  . Allergic rhinitis, cause unspecified   . Carpal tunnel syndrome   . Chronic airway obstruction, not  elsewhere classified   . Esophageal reflux   . Glaucoma (increased eye pressure)   . Lipoma of unspecified site   . Osteoarthrosis, unspecified whether generalized or localized, lower leg   . Other and unspecified hyperlipidemia   . Personal history of colonic polyps   . Personal history of other malignant neoplasm of skin   . Unspecified essential hypertension      Social History   Socioeconomic History  . Marital status: Married    Spouse name: Not on file  . Number of children: 2  . Years of education: Not on file  . Highest education level: Not on file  Social Needs  . Financial resource strain: Not on file  . Food insecurity - worry: Not on file  . Food insecurity - inability: Not on file  . Transportation needs - medical: Not on file  . Transportation needs - non-medical: Not on file  Occupational History  . 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 Smoking status: Former Smoker    Packs/day: 3.00    Years: 25.00    Pack years: 75.00    Last attempt to quit: 11/23/1996    Years since quitting: 20.4  . Smokeless tobacco: Never Used  Substance and Sexual Activity  . Alcohol use: Yes    Alcohol/week: 0.0 oz    Comment: 4 mixed drinks per week  . Drug use: No  . Sexual activity: Not Currently  Other Topics Concern  . Not on file  Social History Narrative   Regular exercise -- no             Past Surgical History:  Procedure Laterality Date  . bone spur  06-2003  right thumb  . KNEE ARTHROSCOPY  09-2007   partial medial mesicecotmy, patellar chonfroplasty  . SHOULDER SURGERY  03-24-2007   removal bone spur  . SHOULDER SURGERY Left 07/2016   [place], Dr. Harmon Pier  . TOTAL KNEE ARTHROPLASTY     03-2009 [facility]  . TOTAL KNEE ARTHROPLASTY Left 12/2012   [facility]    Family History  Problem Relation Age of Onset  . Diabetes Mother   . Hyperlipidemia Mother   . Hypertension Mother   . Heart attack Mother   . Obesity  Mother   . Hyperlipidemia Father   . Hypertension Father   . Lung cancer Father   . Colon polyps Father   . Hyperlipidemia Brother   . Hypertension Brother   . Hyperlipidemia Brother   . Hypertension Brother   . Hypertension Sister   . Hyperlipidemia Sister   . Lung cancer Sister   . Hyperlipidemia Sister   . Drug abuse Daughter     No Known Allergies  Current Outpatient Medications on File Prior to Visit  Medication Sig Dispense Refill  . albuterol (PROVENTIL HFA;VENTOLIN HFA) 108 (90 Base) MCG/ACT inhaler Inhale 2 puffs into the lungs every 4 (four) hours as needed for wheezing or shortness of breath (cough, shortness of breath or wheezing.). 1 Inhaler 1  . amLODipine (NORVASC) 10 MG tablet Take 1 tablet (10 mg total) by mouth daily. 90 tablet 1  . aspirin 81 MG tablet Take 81 mg by mouth daily.      Marland Kitchen atorvastatin (LIPITOR) 40 MG tablet Take 1 tablet (40 mg total) by mouth every evening. 90 tablet 3  . benzonatate (TESSALON) 200 MG capsule Take 1 capsule (200 mg total) 3 (three) times daily as needed by mouth for cough. 30 capsule 0  . budesonide-formoterol (SYMBICORT) 160-4.5 MCG/ACT inhaler Inhale 2 puffs into the lungs 2 (two) times daily. 1 Inhaler 3  . fish oil-omega-3 fatty acids 1000 MG capsule Take 2 g by mouth daily.      Marland Kitchen lisinopril (PRINIVIL,ZESTRIL) 40 MG tablet Take 1 tablet (40 mg total) by mouth daily. 90 tablet 1  . meloxicam (MOBIC) 15 MG tablet Take 1 tablet (15 mg total) by mouth daily. 30 tablet 0  . montelukast (SINGULAIR) 10 MG tablet Take 1 tablet (10 mg total) by mouth at bedtime. 90 tablet 1  . Multiple Vitamin (MULTIVITAMIN) tablet Take 1 tablet by mouth daily.      Marland Kitchen omeprazole (PRILOSEC) 20 MG capsule Take 20 mg by mouth daily.      . sertraline (ZOLOFT) 50 MG tablet Take 1 tablet (50 mg total) by mouth daily. 90 tablet 1  . triamcinolone cream (KENALOG) 0.1 % Apply 1 application topically 2 (two) times daily. 30 g 0   No current  facility-administered medications on file prior to visit.     BP 140/78   Pulse 68   Temp 98.2 F (36.8 C) (Oral)   Ht 5' 9.5" (1.765 m)   Wt 263 lb 6.4 oz (119.5 kg)   SpO2 97%   BMI 38.34 kg/m    Objective:   Physical Exam  Constitutional: He is oriented to person, place, and time. He appears well-nourished.  Neck: Neck supple.  Cardiovascular: Normal rate and regular rhythm.  Pulmonary/Chest: Effort normal. He has no wheezes. He has no rales.  Neurological: He is alert and oriented to person, place, and time. No cranial nerve deficit.  Resting tremor to bilateral hands and left upper extremity. Grips  equal bilaterally. 5/5 strength to upper extremities. No facial drooping, changes in speech.  Skin: Skin is warm and dry.          Assessment & Plan:

## 2017-04-24 ENCOUNTER — Encounter: Payer: Self-pay | Admitting: Primary Care

## 2017-04-24 DIAGNOSIS — E785 Hyperlipidemia, unspecified: Secondary | ICD-10-CM

## 2017-04-24 MED ORDER — ATORVASTATIN CALCIUM 40 MG PO TABS: 80.0000 mg | ORAL_TABLET | Freq: Every evening | ORAL | 3 refills | Status: DC

## 2017-05-02 ENCOUNTER — Other Ambulatory Visit: Payer: Self-pay | Admitting: Primary Care

## 2017-05-02 ENCOUNTER — Encounter: Payer: Self-pay | Admitting: Primary Care

## 2017-05-02 DIAGNOSIS — J069 Acute upper respiratory infection, unspecified: Secondary | ICD-10-CM

## 2017-05-02 MED ORDER — MONTELUKAST SODIUM 10 MG PO TABS: 10.0000 mg | ORAL_TABLET | Freq: Every day | ORAL | 3 refills | Status: DC

## 2017-05-02 NOTE — Telephone Encounter (Signed)
Last office visit 04/19/2017.  Last refilled 02/13/2017 for #30 with no refills.  Ok to refill?

## 2017-05-03 ENCOUNTER — Encounter: Payer: Self-pay | Admitting: Primary Care

## 2017-05-24 ENCOUNTER — Ambulatory Visit: Payer: Medicare HMO | Admitting: Podiatry

## 2017-05-24 ENCOUNTER — Ambulatory Visit (INDEPENDENT_AMBULATORY_CARE_PROVIDER_SITE_OTHER): Payer: Medicare HMO

## 2017-05-24 ENCOUNTER — Encounter: Payer: Self-pay | Admitting: Podiatry

## 2017-05-24 ENCOUNTER — Other Ambulatory Visit: Payer: Self-pay | Admitting: Podiatry

## 2017-05-24 DIAGNOSIS — M779 Enthesopathy, unspecified: Secondary | ICD-10-CM

## 2017-05-24 DIAGNOSIS — M778 Other enthesopathies, not elsewhere classified: Secondary | ICD-10-CM

## 2017-05-24 DIAGNOSIS — M25571 Pain in right ankle and joints of right foot: Secondary | ICD-10-CM

## 2017-05-24 DIAGNOSIS — M7752 Other enthesopathy of left foot: Secondary | ICD-10-CM | POA: Diagnosis not present

## 2017-05-24 MED ORDER — TRIAMCINOLONE ACETONIDE 10 MG/ML IJ SUSP
10.0000 mg | Freq: Once | INTRAMUSCULAR | Status: AC
  Administered 2017-05-24: 10 mg

## 2017-05-26 ENCOUNTER — Encounter: Payer: Self-pay | Admitting: Primary Care

## 2017-05-26 NOTE — Progress Notes (Signed)
Subjective:   [name], male   DOB: 71 y.o.   MRN: [id number]   HPI Patient presents with inflammation of his left ankle and states he has had previous injections which have helped him   ROS      Objective:  Physical Exam  Neurovascular status intact with inflammation of the sinus tarsi left with fluid buildup noted     Assessment:  Sinus tarsitis left with inflammation fluid of the sinus tarsi     Plan:  H&P condition reviewed and sinus tarsi injection administered with no problems.  Instructed on compression therapy and supportive shoes and reappoint as needed  X-rays indicate that there is no signs of advanced arthritis or fracture noted

## 2017-06-03 ENCOUNTER — Telehealth: Payer: Self-pay | Admitting: Primary Care

## 2017-06-03 NOTE — Telephone Encounter (Signed)
Copied from [place] [id number]. Topic: Quick Communication - Rx Refill/Question >> Jun 03, 2017 11:57 AM Boyd Kerbs wrote: Medication:   benzonatate (TESSALON) 200 MG capsule     - [employer] will not cover at all  montelukast (SINGULAIR) 10 MG tablet  Too high even with insurance.  He says his insurance said 0 on generic    Has the patient contacted their pharmacy? Yes.     (Agent: If no, request that the patient contact the pharmacy for the refill.)   Preferred Pharmacy (with phone number or street name): [id number] [place], [place] - [address] [place] [address] Phone: [phone] Fax: [phone]  Agent: Please be advised that RX refills may take up to 3 business days. We ask that you follow-up with your pharmacy.

## 2017-06-04 ENCOUNTER — Telehealth: Payer: Self-pay | Admitting: Primary Care

## 2017-06-04 NOTE — Telephone Encounter (Signed)
Tried to calling patient 3 times but kept getting a busy signal.

## 2017-06-04 NOTE — Telephone Encounter (Signed)
Message left for patient to return my call.  

## 2017-06-04 NOTE — Telephone Encounter (Signed)
Copied from [place] [id number]. Topic: General - Other >> Jun 04, 2017  1:23 PM Darl Householder, RMA wrote: Reason for CRM: Patient is returning call to Jacqualin Combes, CMA, please call pt

## 2017-06-04 NOTE — Telephone Encounter (Signed)
Noted. This has been addressed another telephone encounter.

## 2017-06-04 NOTE — Telephone Encounter (Signed)
Did leave message with patient in there afternoon on 05/27/2017 and 05/31/2017.  Patient have not called back in regarding this.

## 2017-06-04 NOTE — Telephone Encounter (Signed)
Tried to calling patient 4 times but kept getting a busy signal.   Send patient a message through [employer]

## 2017-06-05 NOTE — Telephone Encounter (Signed)
Left message to call office

## 2017-06-05 NOTE — Telephone Encounter (Signed)
Pt calling back and not very happy that he has not received a call back. I did advise pt that CMA has tried to call several times, over 7-6 times, but keep getting a busy signal.  Pt states he has a phone system that works when it works, but he works most afternoon, and he requesting a call in the morning times.  But pt states he is at home now.  Pt states he would really like [name] to call him. 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

## 2017-06-06 NOTE — Telephone Encounter (Signed)
Spoke with patient who was told by his insurance that there should be a $0 copay on the Singulair. [facility]oke with [facility] who also spoke with insurance and the cost is $30 per 90 day supply. Called patient back recommending he speak with the customer service line through [facility]. He will call and update. I verified that he has [facility] of refills at [facility]. He was apologetic for his behavior as it was out of frustration.

## 2017-06-06 NOTE — Telephone Encounter (Signed)
FYI

## 2017-06-11 DIAGNOSIS — Z961 Presence of intraocular lens: Secondary | ICD-10-CM | POA: Diagnosis not present

## 2017-06-11 DIAGNOSIS — H25813 Combined forms of age-related cataract, bilateral: Secondary | ICD-10-CM | POA: Diagnosis not present

## 2017-06-11 DIAGNOSIS — H40013 Open angle with borderline findings, low risk, bilateral: Secondary | ICD-10-CM | POA: Diagnosis not present

## 2017-06-14 DIAGNOSIS — R9082 White matter disease, unspecified: Secondary | ICD-10-CM | POA: Diagnosis not present

## 2017-06-14 DIAGNOSIS — G9389 Other specified disorders of brain: Secondary | ICD-10-CM | POA: Diagnosis not present

## 2017-06-24 ENCOUNTER — Telehealth: Payer: Self-pay | Admitting: Primary Care

## 2017-06-24 DIAGNOSIS — E782 Mixed hyperlipidemia: Secondary | ICD-10-CM

## 2017-06-24 NOTE — Telephone Encounter (Signed)
Medication  Change  Request  Pt  Takes  Lipitor  40  Mg   2  Tabs   Every  Evening  He  Would  Like  It  Changed  To 80  Mg  Tab   Every  Evening  Sent to  [place]  04/19/2017

## 2017-06-24 NOTE — Telephone Encounter (Signed)
Copied from [place]. Topic: Quick Communication - See Telephone Encounter >> Jun 24, 2017 11:24 AM Bea Graff, NT wrote: CRM for notification. See Telephone encounter for: 06/24/17. Pt is needing his atorvastatin (LIPITOR) sent in to [facility] order for 80mg  instead of the 40mg .

## 2017-06-25 MED ORDER — ATORVASTATIN CALCIUM 80 MG PO TABS: 80.0000 mg | ORAL_TABLET | Freq: Every day | ORAL | 3 refills | Status: DC

## 2017-06-25 NOTE — Telephone Encounter (Signed)
Patient due for repeat lipid panel, will you please schedule? Looks like we've had a tough time getting in contact with him to schedule. Orders are in. Thanks!

## 2017-06-25 NOTE — Telephone Encounter (Signed)
Ok to change

## 2017-06-25 NOTE — Telephone Encounter (Signed)
Message left for patient to return my call.  

## 2017-06-26 NOTE — Telephone Encounter (Signed)
Lab appt 06/27/2017

## 2017-06-27 ENCOUNTER — Other Ambulatory Visit (INDEPENDENT_AMBULATORY_CARE_PROVIDER_SITE_OTHER): Payer: Medicare HMO

## 2017-06-27 DIAGNOSIS — E782 Mixed hyperlipidemia: Secondary | ICD-10-CM

## 2017-06-27 LAB — LIPID PANEL
Cholesterol: 192 mg/dL (ref 0–200)
HDL: 85.8 mg/dL (ref >39.00)
LDL Cholesterol: 78 mg/dL (ref 0–99)
NONHDL: 106.21
Total CHOL/HDL Ratio: 2
Triglycerides: 139 mg/dL (ref 0.0–149.0)
VLDL: 27.8 mg/dL (ref 0.0–40.0)

## 2017-06-27 LAB — HEPATIC FUNCTION PANEL
ALBUMIN: 4.2 g/dL (ref 3.5–5.2)
ALK PHOS: 49 U/L (ref 39–117)
ALT: 35 U/L (ref 0–53)
AST: 25 U/L (ref 0–37)
BILIRUBIN TOTAL: 0.5 mg/dL (ref 0.2–1.2)
Bilirubin, Direct: 0.1 mg/dL (ref 0.0–0.3)
Total Protein: 7.2 g/dL (ref 6.0–8.3)

## 2017-06-28 NOTE — Telephone Encounter (Signed)
Pt calling and states he received a voicemail yesterday that stated he had an appt on Tuesday and he wanted to see why or who called. States he did come in yesterday for his lab draw.

## 2017-07-01 NOTE — Telephone Encounter (Signed)
Patient has viewed the results on MyChart.

## 2017-07-02 DIAGNOSIS — R69 Illness, unspecified: Secondary | ICD-10-CM | POA: Diagnosis not present

## 2017-07-10 ENCOUNTER — Encounter: Payer: Self-pay | Admitting: Primary Care

## 2017-07-10 ENCOUNTER — Ambulatory Visit (INDEPENDENT_AMBULATORY_CARE_PROVIDER_SITE_OTHER): Payer: Medicare HMO | Admitting: Primary Care

## 2017-07-10 VITALS — BP 124/82 | HR 83 | Temp 98.2°F | Ht 69.5 in | Wt 259.0 lb

## 2017-07-10 DIAGNOSIS — M545 Low back pain, unspecified: Secondary | ICD-10-CM

## 2017-07-10 LAB — POC URINALSYSI DIPSTICK (AUTOMATED)
BILIRUBIN UA: NEGATIVE
Blood, UA: NEGATIVE
GLUCOSE UA: NEGATIVE
Ketones, UA: NEGATIVE
LEUKOCYTES UA: NEGATIVE
NITRITE UA: NEGATIVE
Spec Grav, UA: >=1.03 — AB (ref 1.010–1.025)
Urobilinogen, UA: 0.2 E.U./dL
pH, UA: 6 (ref 5.0–8.0)

## 2017-07-10 MED ORDER — TIZANIDINE HCL 4 MG PO TABS: 4.0000 mg | ORAL_TABLET | Freq: Three times a day (TID) | ORAL | 0 refills | Status: DC | PRN

## 2017-07-10 NOTE — Patient Instructions (Signed)
Start tizanidine 4 mg tablets for muscle strain. You can take every 8 hours as needed. This may cause drowsiness.  Continue Tylenol, do not exceed 3000 mg in 24 hours.  Use a heating pad 3-4 times daily for 30 minute intervals.  Please call me if no improvement Monday next week.   It was a pleasure to see you today!

## 2017-07-10 NOTE — Progress Notes (Signed)
Subjective:    [name], male    DOB: 06-May-1945, 72 y.o.   MRN: [id number]  HPI  Alan [name] is a 72 year old male who presents today with a chief complaint of left lower back pain.   About one week ago he was mowing his girlfriends yard on a riding mower. He went into a ditch and was thrown from the mower. He landed on his left side and did not hit his head. His lower back pain began two days later (four days ago) which will radiate to his left lower groin. He describes his pain as an intermittent sharp pain, worse with movement, improved with rest.   He denies urinary frequency, dysuria, hematuria, headaches. He's taken Tylenol and one OxyContin (older prescription) without improvement. He has a history of kidney stones at age 12, no stone since.   Review of Systems  Genitourinary: Negative for dysuria, flank pain and hematuria.  Musculoskeletal: Positive for back pain.  Skin:       Bruising to left leg and left upper extremity       Past Medical History:  Diagnosis Date  . Allergic rhinitis, cause unspecified   . Carpal tunnel syndrome   . Chronic airway obstruction, not elsewhere classified   . Esophageal reflux   . Glaucoma (increased eye pressure)   . Lipoma of unspecified site   . Osteoarthrosis, unspecified whether generalized or localized, lower leg   . Other and unspecified hyperlipidemia   . Personal history of colonic polyps   . Personal history of other malignant neoplasm of skin   . Unspecified essential hypertension      Social History   Socioeconomic History  . Marital status: Married    Spouse name: Not on file  . Number of children: 2  . Years of education: Not on file  . Highest education level: Not on file  Occupational History  . 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 Financial resource strain: Not on file  . Food insecurity:    Worry: Not on file    Inability: Not on file  . Transportation needs:   Medical: Not on file    Non-medical: Not on file  Tobacco Use  . Smoking status: Former Smoker    Packs/day: 3.00    Years: 25.00    Pack years: 75.00    Last attempt to quit: 11/23/1996    Years since quitting: 20.6  . Smokeless tobacco: Never Used  Substance and Sexual Activity  . Alcohol use: Yes    Alcohol/week: 0.0 oz    Comment: 4 mixed drinks per week  . Drug use: No  . Sexual activity: Not Currently  Lifestyle  . Physical activity:    Days per week: Not on file    Minutes per session: Not on file  . Stress: Not on file  Relationships  . Social connections:    Talks on phone: Not on file    Gets together: Not on file    Attends religious service: Not on file    Active member of club or organization: Not on file    Attends meetings of clubs or organizations: Not on file    Relationship status: Not on file  . Intimate partner violence:    Fear of current or ex partner: Not on file    Emotionally abused: Not on file    Physically abused: Not on file    Forced sexual activity: Not on file  Other Topics Concern  . Not on file  Social History Narrative   Regular exercise -- no             Past Surgical History:  Procedure Laterality Date  . bone spur  06-2003   right thumb  . KNEE ARTHROSCOPY  09-2007   partial medial mesicecotmy, patellar chonfroplasty  . SHOULDER SURGERY  03-24-2007   removal bone spur  . SHOULDER SURGERY Left 07/2016   [place], Dr. Harmon Pier  . TOTAL KNEE ARTHROPLASTY     03-2009 [facility]  . TOTAL KNEE ARTHROPLASTY Left 12/2012   [facility]    Family History  Problem Relation Age of Onset  . Diabetes Mother   . Hyperlipidemia Mother   . Hypertension Mother   . Heart attack Mother   . Obesity Mother   . Hyperlipidemia Father   . Hypertension Father   . Lung cancer Father   . Colon polyps Father   . Hyperlipidemia Brother   . Hypertension Brother   . Hyperlipidemia Brother   . Hypertension Brother   . Hypertension  Sister   . Hyperlipidemia Sister   . Lung cancer Sister   . Hyperlipidemia Sister   . Drug abuse Daughter     No Known Allergies  Current Outpatient Medications on File Prior to Visit  Medication Sig Dispense Refill  . albuterol (PROVENTIL HFA;VENTOLIN HFA) 108 (90 Base) MCG/ACT inhaler Inhale 2 puffs into the lungs every 4 (four) hours as needed for wheezing or shortness of breath (cough, shortness of breath or wheezing.). 1 Inhaler 1  . amLODipine (NORVASC) 10 MG tablet Take 1 tablet (10 mg total) by mouth daily. 90 tablet 1  . aspirin 81 MG tablet Take 81 mg by mouth daily.      Marland Kitchen atorvastatin (LIPITOR) 80 MG tablet Take 1 tablet (80 mg total) by mouth daily. 90 tablet 3  . benzonatate (TESSALON) 200 MG capsule Take 1 capsule (200 mg total) 3 (three) times daily as needed by mouth for cough. 30 capsule 0  . budesonide-formoterol (SYMBICORT) 160-4.5 MCG/ACT inhaler Inhale 2 puffs into the lungs 2 (two) times daily. 1 Inhaler 3  . fish oil-omega-3 fatty acids 1000 MG capsule Take 2 g by mouth daily.      Marland Kitchen lisinopril (PRINIVIL,ZESTRIL) 40 MG tablet Take 1 tablet (40 mg total) by mouth daily. 90 tablet 1  . meloxicam (MOBIC) 15 MG tablet Take 1 tablet (15 mg total) by mouth daily. 30 tablet 0  . montelukast (SINGULAIR) 10 MG tablet Take 1 tablet (10 mg total) by mouth at bedtime. 90 tablet 3  . Multiple Vitamin (MULTIVITAMIN) tablet Take 1 tablet by mouth daily.      Marland Kitchen omeprazole (PRILOSEC) 20 MG capsule Take 20 mg by mouth daily.      . sertraline (ZOLOFT) 50 MG tablet Take 1 tablet (50 mg total) by mouth daily. 90 tablet 1  . triamcinolone cream (KENALOG) 0.1 % Apply 1 application topically 2 (two) times daily. 30 g 0   No current facility-administered medications on file prior to visit.     BP 124/82   Pulse 83   Temp 98.2 F (36.8 C) (Oral)   Ht 5' 9.5" (1.765 m)   Wt 259 lb (117.5 kg)   SpO2 98%   BMI 37.70 kg/m    Objective:   Physical Exam  Constitutional: He appears  well-nourished.  Cardiovascular: Normal rate.  Pulmonary/Chest: Effort normal and breath sounds normal.  Abdominal: There is  no CVA tenderness.  Musculoskeletal:       Lumbar back: He exhibits decreased range of motion and tenderness.       Back:  Muscle tenderness upon palpation to right lower back.  Decrease in ROM with increase in pain with extension and flexion of lumbar back.   Skin: Skin is warm and dry.  Dark blue and green bruising to left lateral thigh and left lateral upper extremity near elbow, appears to be healing.           Assessment & Plan:  Muscle strain:  Right lower back pain since fall from mower 6 days ago. Exam today with MSK tenderness upon palpation, increased pain with movement. Does not appear uncomfortable in exam room, in no distress.  UA today: Negative for leuks, nitrites, blood.  Suspect more MSK rather than renal stone. Rx for Flexeril course sent to pharmacy. Discussed heating pad, stretching exercises.  Consider xray if symptoms persist.   Pleas Koch, NP

## 2017-07-12 ENCOUNTER — Encounter: Payer: Self-pay | Admitting: Primary Care

## 2017-07-12 NOTE — Telephone Encounter (Signed)
Called and spoken to patient. He stated that he does not feel any better. He cannot stand for long, his back and knee would hurt. The muscle relaxer did not help much. He stated that the only comfort he had is in a recliner. Please advise.

## 2017-07-12 NOTE — Telephone Encounter (Signed)
Patient would like to know what else can be done since muscle relaxer is not helping.

## 2017-07-12 NOTE — Telephone Encounter (Signed)
Alan [name], will you find out what he needs? Thanks!

## 2017-07-15 ENCOUNTER — Ambulatory Visit (INDEPENDENT_AMBULATORY_CARE_PROVIDER_SITE_OTHER)
Admission: RE | Admit: 2017-07-15 | Discharge: 2017-07-15 | Disposition: A | Discharge status: Home / Self Care | Payer: Medicare HMO | Source: Ambulatory Visit | Attending: Primary Care | Admitting: Primary Care

## 2017-07-15 ENCOUNTER — Telehealth: Payer: Self-pay

## 2017-07-15 ENCOUNTER — Encounter: Payer: Self-pay | Admitting: Internal Medicine

## 2017-07-15 ENCOUNTER — Telehealth: Payer: Self-pay | Admitting: Primary Care

## 2017-07-15 ENCOUNTER — Other Ambulatory Visit: Payer: Self-pay | Admitting: Primary Care

## 2017-07-15 DIAGNOSIS — M25552 Pain in left hip: Secondary | ICD-10-CM

## 2017-07-15 DIAGNOSIS — M545 Low back pain, unspecified: Secondary | ICD-10-CM

## 2017-07-15 DIAGNOSIS — S79912A Unspecified injury of left hip, initial encounter: Secondary | ICD-10-CM | POA: Diagnosis not present

## 2017-07-15 NOTE — Telephone Encounter (Signed)
Noted, orders placed. 

## 2017-07-15 NOTE — Telephone Encounter (Signed)
Copied from [place] [id number]. Topic: Quick Communication - Other Results >> Jul 15, 2017  4:17 PM Robina Ade, Helene Kelp D wrote: Patient called and said that he would like a CD copy of his xray so her can take to his chiropracter doctor. Please call patient when its ready for pick up, thanks.

## 2017-07-15 NOTE — Telephone Encounter (Signed)
Attempted to call patient to discuss his last My Chart message, no answer and no voice mail.

## 2017-07-15 NOTE — Telephone Encounter (Addendum)
Pt called and his pain is same or worse than when seen on 07/10/17; no tingling or numbness but sharpe pain in back that radiates down lt hip and knee; pain level now is 10. Allie Bossier NP said for pt to come in for xray of back and hip; pt voiced understanding and will come for xray in 30'. FYI to Gentry Fitz NP.

## 2017-07-16 NOTE — Telephone Encounter (Signed)
Called and spoke with patient. I informed him that his disc was up front at check-in.

## 2017-07-23 ENCOUNTER — Telehealth: Payer: Self-pay

## 2017-07-23 DIAGNOSIS — M545 Low back pain, unspecified: Secondary | ICD-10-CM

## 2017-07-23 NOTE — Telephone Encounter (Signed)
Would he like to see an orthopedist for further evaluation? I think this is the next best step.

## 2017-07-23 NOTE — Telephone Encounter (Signed)
Pt last seen 07/10/17; pt had xrays on 07/15/17 and Alan Fitz NP wanted to know if pt wanted to try PT.

## 2017-07-23 NOTE — Telephone Encounter (Signed)
Copied from [place] [phone] [phone]. Topic: General - Other >> Jul 23, 2017  4:07 PM Valla Leaver wrote: Reason for CRM: Patient requested a call back from NP [facility] about the low back pain he's been having. It hasn't improved at all even after seeing a chiropractor 5 times.

## 2017-07-24 ENCOUNTER — Telehealth: Payer: Self-pay | Admitting: Primary Care

## 2017-07-24 NOTE — Telephone Encounter (Signed)
Pt. Called back and would like the referral to orthopedics as soon as possible.Please call pt. When this is done.

## 2017-07-24 NOTE — Telephone Encounter (Signed)
Patient left message on my VM that he wants to see an Orthopedist for his back pain. Please place Ortho Referral ASAP. He will go to [facility] and wants to be seen ASAP.

## 2017-07-24 NOTE — Telephone Encounter (Signed)
Message left for patient to return my call.  

## 2017-07-24 NOTE — Telephone Encounter (Signed)
Rosaria Ferries if you get a chance before me could you look into this today? Since I am up front. Thank Edrick Kins, RMA

## 2017-07-24 NOTE — Telephone Encounter (Signed)
Noted, referral placed. FYI to Anastasiya.

## 2017-07-24 NOTE — Telephone Encounter (Signed)
Patient called back per [facility] but you were not available, please call back to patient. Thank Edrick Kins, RMA

## 2017-07-24 NOTE — Telephone Encounter (Signed)
Referral placed.

## 2017-07-25 NOTE — Telephone Encounter (Signed)
Ortho Appt made with [facility] for 07/26/17 at 8:00am and patient notified.

## 2017-07-26 DIAGNOSIS — M5136 Other intervertebral disc degeneration, lumbar region: Secondary | ICD-10-CM | POA: Diagnosis not present

## 2017-07-26 DIAGNOSIS — M5416 Radiculopathy, lumbar region: Secondary | ICD-10-CM | POA: Diagnosis not present

## 2017-07-29 ENCOUNTER — Other Ambulatory Visit: Payer: Self-pay | Admitting: Orthopedic Surgery

## 2017-07-29 ENCOUNTER — Other Ambulatory Visit: Payer: Medicare HMO

## 2017-07-29 DIAGNOSIS — M5416 Radiculopathy, lumbar region: Secondary | ICD-10-CM

## 2017-07-30 DIAGNOSIS — M5416 Radiculopathy, lumbar region: Secondary | ICD-10-CM | POA: Insufficient documentation

## 2017-07-31 ENCOUNTER — Ambulatory Visit
Admission: RE | Admit: 2017-07-31 | Discharge: 2017-07-31 | Disposition: A | Discharge status: Home / Self Care | Payer: Medicare HMO | Source: Ambulatory Visit | Attending: Orthopedic Surgery | Admitting: Orthopedic Surgery

## 2017-07-31 DIAGNOSIS — M48061 Spinal stenosis, lumbar region without neurogenic claudication: Secondary | ICD-10-CM | POA: Diagnosis not present

## 2017-07-31 DIAGNOSIS — M5416 Radiculopathy, lumbar region: Secondary | ICD-10-CM

## 2017-08-02 DIAGNOSIS — M5416 Radiculopathy, lumbar region: Secondary | ICD-10-CM | POA: Diagnosis not present

## 2017-08-13 DIAGNOSIS — M5416 Radiculopathy, lumbar region: Secondary | ICD-10-CM | POA: Diagnosis not present

## 2017-08-14 DIAGNOSIS — R32 Unspecified urinary incontinence: Secondary | ICD-10-CM | POA: Diagnosis not present

## 2017-08-14 DIAGNOSIS — R69 Illness, unspecified: Secondary | ICD-10-CM | POA: Diagnosis not present

## 2017-08-14 DIAGNOSIS — K08409 Partial loss of teeth, unspecified cause, unspecified class: Secondary | ICD-10-CM | POA: Diagnosis not present

## 2017-08-14 DIAGNOSIS — J449 Chronic obstructive pulmonary disease, unspecified: Secondary | ICD-10-CM | POA: Diagnosis not present

## 2017-08-14 DIAGNOSIS — K219 Gastro-esophageal reflux disease without esophagitis: Secondary | ICD-10-CM | POA: Diagnosis not present

## 2017-08-14 DIAGNOSIS — E785 Hyperlipidemia, unspecified: Secondary | ICD-10-CM | POA: Diagnosis not present

## 2017-08-14 DIAGNOSIS — J309 Allergic rhinitis, unspecified: Secondary | ICD-10-CM | POA: Diagnosis not present

## 2017-08-14 DIAGNOSIS — I10 Essential (primary) hypertension: Secondary | ICD-10-CM | POA: Diagnosis not present

## 2017-08-14 DIAGNOSIS — Z7951 Long term (current) use of inhaled steroids: Secondary | ICD-10-CM | POA: Diagnosis not present

## 2017-08-14 DIAGNOSIS — E669 Obesity, unspecified: Secondary | ICD-10-CM | POA: Diagnosis not present

## 2017-08-20 DIAGNOSIS — M5416 Radiculopathy, lumbar region: Secondary | ICD-10-CM | POA: Diagnosis not present

## 2017-08-23 DIAGNOSIS — M5416 Radiculopathy, lumbar region: Secondary | ICD-10-CM | POA: Diagnosis not present

## 2017-08-27 DIAGNOSIS — M5416 Radiculopathy, lumbar region: Secondary | ICD-10-CM | POA: Diagnosis not present

## 2017-08-28 DIAGNOSIS — M5416 Radiculopathy, lumbar region: Secondary | ICD-10-CM | POA: Diagnosis not present

## 2017-08-30 ENCOUNTER — Encounter: Payer: Self-pay | Admitting: Internal Medicine

## 2017-09-03 DIAGNOSIS — L814 Other melanin hyperpigmentation: Secondary | ICD-10-CM | POA: Diagnosis not present

## 2017-09-03 DIAGNOSIS — L821 Other seborrheic keratosis: Secondary | ICD-10-CM | POA: Diagnosis not present

## 2017-09-03 DIAGNOSIS — H61002 Unspecified perichondritis of left external ear: Secondary | ICD-10-CM | POA: Diagnosis not present

## 2017-09-03 DIAGNOSIS — Z85828 Personal history of other malignant neoplasm of skin: Secondary | ICD-10-CM | POA: Diagnosis not present

## 2017-09-03 DIAGNOSIS — L570 Actinic keratosis: Secondary | ICD-10-CM | POA: Diagnosis not present

## 2017-09-03 DIAGNOSIS — M5416 Radiculopathy, lumbar region: Secondary | ICD-10-CM | POA: Diagnosis not present

## 2017-09-03 DIAGNOSIS — D225 Melanocytic nevi of trunk: Secondary | ICD-10-CM | POA: Diagnosis not present

## 2017-09-16 ENCOUNTER — Telehealth: Payer: Self-pay | Admitting: Primary Care

## 2017-09-16 NOTE — Telephone Encounter (Signed)
[facility] called and spoke to [place], [employer] officer. I advised the patient called to ask for a refill on his Singulair and asked about the prescription that was sent on 05/02/17 90 day supply/3 refills, he says the patient has 2 profiles and it was filled from the old profile and he has refills available.

## 2017-09-16 NOTE — Telephone Encounter (Signed)
Copied from [place] [id number]. Topic: Inquiry >> Sep 16, 2017  1:07 PM Pricilla Handler wrote: Reason for CRM: Patient called requesting a refill of montelukast (SINGULAIR) 10 MG tablet. Patient has contacted his pharmacy. [facility]'s preferred pharmacy is [id number] - [place], [place] - [address] [phone] (Phone)   [phone] (Fax).       Thank You!!!

## 2017-09-17 DIAGNOSIS — M5416 Radiculopathy, lumbar region: Secondary | ICD-10-CM | POA: Diagnosis not present

## 2017-09-20 ENCOUNTER — Ambulatory Visit (INDEPENDENT_AMBULATORY_CARE_PROVIDER_SITE_OTHER): Payer: Medicare HMO | Admitting: Primary Care

## 2017-09-20 ENCOUNTER — Encounter: Payer: Self-pay | Admitting: Primary Care

## 2017-09-20 VITALS — BP 136/80 | HR 86 | Temp 98.2°F | Ht 69.5 in | Wt 254.5 lb

## 2017-09-20 DIAGNOSIS — I10 Essential (primary) hypertension: Secondary | ICD-10-CM

## 2017-09-20 DIAGNOSIS — K219 Gastro-esophageal reflux disease without esophagitis: Secondary | ICD-10-CM

## 2017-09-20 DIAGNOSIS — R05 Cough: Secondary | ICD-10-CM | POA: Diagnosis not present

## 2017-09-20 DIAGNOSIS — J449 Chronic obstructive pulmonary disease, unspecified: Secondary | ICD-10-CM | POA: Diagnosis not present

## 2017-09-20 DIAGNOSIS — J3089 Other allergic rhinitis: Secondary | ICD-10-CM | POA: Diagnosis not present

## 2017-09-20 DIAGNOSIS — R053 Chronic cough: Secondary | ICD-10-CM

## 2017-09-20 MED ORDER — LOSARTAN POTASSIUM 100 MG PO TABS: ORAL_TABLET | ORAL | 0 refills | Status: DC

## 2017-09-20 MED ORDER — CETIRIZINE HCL 10 MG PO TABS: ORAL_TABLET | ORAL | 0 refills | Status: DC

## 2017-09-20 NOTE — Assessment & Plan Note (Signed)
Suspect this to be contributing to chronic cough. Continue Singulair HS, add in Zyrtec every morning. Discussed drowsiness precautions.

## 2017-09-20 NOTE — Progress Notes (Signed)
Subjective:    Patient ID: ERHARD SENSKE, male    DOB: 04/12/1945, 72 y.o.   MRN: [id number]  HPI  Mr. Spiller is a 72 year old male with a history of COPD, GERD, Allergic Rhinitis, Hypertension managed on ACE who presents today with a chief complaint of chronic cough.  He is currently managed on Symbicort BID, albuterol PRN, Singulair, omeprazole 20 mg. He is managed on lisinopril for hypertension which was held in the past for 2 weeks without improvement in cough. He preferred to continue lisinopril as he has been on this medication for years.   Since his last visit he's been coughing for the last three weeks, worse at night. He will sometimes cough up clear sputum. He's not been taking omeprazole 20 mg. He's been experiencing a lot of rhinorrhea during the day. He describes his cough as a dry tickle at times. He denies esophageal burning/reflux, throat fullness, epigastric pain.  Wt Readings from Last 3 Encounters:  09/20/17 254 lb 8 oz (115.4 kg)  07/10/17 259 lb (117.5 kg)  04/19/17 263 lb 6.4 oz (119.5 kg)   BP Readings from Last 3 Encounters:  09/20/17 136/80  07/10/17 124/82  04/19/17 140/78   He's checking his BP at home 3-4 times weekly and is getting 110's/70's. He denies dizziness.   Review of Systems  Constitutional: Negative for fever.  HENT: Positive for postnasal drip and rhinorrhea. Negative for congestion, sinus pressure and sore throat.   Respiratory: Positive for cough. Negative for shortness of breath and wheezing.   Allergic/Immunologic: Positive for environmental allergies.       Past Medical History:  Diagnosis Date  . Allergic rhinitis, cause unspecified   . Carpal tunnel syndrome   . Chronic airway obstruction, not elsewhere classified   . Esophageal reflux   . Glaucoma (increased eye pressure)   . Lipoma of unspecified site   . Osteoarthrosis, unspecified whether generalized or localized, lower leg   . Other and unspecified hyperlipidemia   .  Personal history of colonic polyps   . Personal history of other malignant neoplasm of skin   . Unspecified essential hypertension      Social History   Socioeconomic History  . Marital status: Married    Spouse name: Not on file  . Number of children: 2  . Years of education: Not on file  . Highest education level: Not on file  Occupational History  . 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 Financial resource strain: Not on file  . Food insecurity:    Worry: Not on file    Inability: Not on file  . Transportation needs:    Medical: Not on file    Non-medical: Not on file  Tobacco Use  . Smoking status: Former Smoker    Packs/day: 3.00    Years: 25.00    Pack years: 75.00    Last attempt to quit: 11/23/1996    Years since quitting: 20.8  . Smokeless tobacco: Never Used  Substance and Sexual Activity  . Alcohol use: Yes    Alcohol/week: 0.0 oz    Comment: 4 mixed drinks per week  . Drug use: No  . Sexual activity: Not Currently  Lifestyle  . Physical activity:    Days per week: Not on file    Minutes per session: Not on file  . Stress: Not on file  Relationships  . Social connections:    Talks on phone: Not on file  Gets together: Not on file    Attends religious service: Not on file    Active member of club or organization: Not on file    Attends meetings of clubs or organizations: Not on file    Relationship status: Not on file  . Intimate partner violence:    Fear of current or ex partner: Not on file    Emotionally abused: Not on file    Physically abused: Not on file    Forced sexual activity: Not on file  Other Topics Concern  . Not on file  Social History Narrative   Regular exercise -- no             Past Surgical History:  Procedure Laterality Date  . bone spur  06-2003   right thumb  . KNEE ARTHROSCOPY  09-2007   partial medial mesicecotmy, patellar chonfroplasty  . SHOULDER SURGERY  03-24-2007   removal bone spur  .  SHOULDER SURGERY Left 07/2016   [place], Dr. Harmon Pier  . TOTAL KNEE ARTHROPLASTY     03-2009 [facility]  . TOTAL KNEE ARTHROPLASTY Left 12/2012   [facility]    Family History  Problem Relation Age of Onset  . Diabetes Mother   . Hyperlipidemia Mother   . Hypertension Mother   . Heart attack Mother   . Obesity Mother   . Hyperlipidemia Father   . Hypertension Father   . Lung cancer Father   . Colon polyps Father   . Hyperlipidemia Brother   . Hypertension Brother   . Hyperlipidemia Brother   . Hypertension Brother   . Hypertension Sister   . Hyperlipidemia Sister   . Lung cancer Sister   . Hyperlipidemia Sister   . Drug abuse Daughter     No Known Allergies  Current Outpatient Medications on File Prior to Visit  Medication Sig Dispense Refill  . albuterol (PROVENTIL HFA;VENTOLIN HFA) 108 (90 Base) MCG/ACT inhaler Inhale 2 puffs into the lungs every 4 (four) hours as needed for wheezing or shortness of breath (cough, shortness of breath or wheezing.). 1 Inhaler 1  . amLODipine (NORVASC) 10 MG tablet Take 1 tablet (10 mg total) by mouth daily. 90 tablet 1  . aspirin 81 MG tablet Take 81 mg by mouth daily.      Marland Kitchen atorvastatin (LIPITOR) 80 MG tablet Take 1 tablet (80 mg total) by mouth daily. 90 tablet 3  . budesonide-formoterol (SYMBICORT) 160-4.5 MCG/ACT inhaler Inhale 2 puffs into the lungs 2 (two) times daily. 1 Inhaler 3  . fish oil-omega-3 fatty acids 1000 MG capsule Take 2 g by mouth daily.      . meloxicam (MOBIC) 15 MG tablet Take 1 tablet (15 mg total) by mouth daily. 30 tablet 0  . montelukast (SINGULAIR) 10 MG tablet Take 1 tablet (10 mg total) by mouth at bedtime. 90 tablet 3  . Multiple Vitamin (MULTIVITAMIN) tablet Take 1 tablet by mouth daily.      Marland Kitchen omeprazole (PRILOSEC) 20 MG capsule Take 20 mg by mouth daily.      . sertraline (ZOLOFT) 50 MG tablet Take 1 tablet (50 mg total) by mouth daily. 90 tablet 1  . triamcinolone cream (KENALOG) 0.1 %  Apply 1 application topically 2 (two) times daily. 30 g 0  . tiZANidine (ZANAFLEX) 4 MG tablet Take 1 tablet (4 mg total) by mouth every 8 (eight) hours as needed for muscle spasms. (Patient not taking: Reported on 09/20/2017) 30 tablet 0   No current  facility-administered medications on file prior to visit.     BP 136/80   Pulse 86   Temp 98.2 F (36.8 C) (Oral)   Ht 5' 9.5" (1.765 m)   Wt 254 lb 8 oz (115.4 kg)   SpO2 98%   BMI 37.04 kg/m    Objective:   Physical Exam  Constitutional: He appears well-nourished. He does not appear ill.  HENT:  Right Ear: Tympanic membrane and ear canal normal.  Left Ear: Tympanic membrane and ear canal normal.  Nose: Mucosal edema present. Right sinus exhibits no maxillary sinus tenderness and no frontal sinus tenderness. Left sinus exhibits no maxillary sinus tenderness and no frontal sinus tenderness.  Mouth/Throat: Oropharynx is clear and moist.  Neck: Neck supple.  Cardiovascular: Normal rate and regular rhythm.  Respiratory: Effort normal and breath sounds normal. He has no wheezes.  Skin: Skin is warm and dry.           Assessment & Plan:

## 2017-09-20 NOTE — Assessment & Plan Note (Signed)
Increased over last three weeks which could very well be due to allergies vs aggravation of ACE.  Switch lisinopril to losartan. Add Zyrtec to Singulair. Consider resuming omeprazole if no improvement. Continue Symbicort.  Follow up in 3 weeks.

## 2017-09-20 NOTE — Assessment & Plan Note (Signed)
Compliant to Symbicort, using albuterol infrequently. Continue same.

## 2017-09-20 NOTE — Patient Instructions (Signed)
Stop taking lisinopril 40 mg tablets for blood pressure.  Start taking losartan 100 mg tablets for blood pressure. Cut the tablet in half if your blood pressure gets at or below 100/60.  Continue your other blood pressure medication.  Continue montelukast (Singulair) tablets for allergies at bedtime. Start taking cetirizine (Zyrtec) tablets for allergies in the morning.   Continue using your inhaler.   Schedule a follow up visit in 3 weeks for blood pressure check.  It was a pleasure to see you today!

## 2017-09-20 NOTE — Assessment & Plan Note (Addendum)
Stable in the office today. Will once and for all discontinue lisinopril and switch to losartan. This is likely part of the cause of his chronic cough. He does agree to this today.  Will have him monitor BP at home and follow up in 3 weeks for BP check. Discussed to cut tablet in half for low BP readings.

## 2017-09-20 NOTE — Assessment & Plan Note (Signed)
Not using omeprazole. Will continue to hold for now to see if ACE is culprit of chronic cough. Will resume if no improvement after removal of ACE as his cough is worse HS.

## 2017-10-05 ENCOUNTER — Other Ambulatory Visit: Payer: Self-pay | Admitting: Primary Care

## 2017-10-05 DIAGNOSIS — F329 Major depressive disorder, single episode, unspecified: Secondary | ICD-10-CM

## 2017-10-05 DIAGNOSIS — F32A Depression, unspecified: Secondary | ICD-10-CM

## 2017-10-05 DIAGNOSIS — I10 Essential (primary) hypertension: Secondary | ICD-10-CM

## 2017-10-08 ENCOUNTER — Other Ambulatory Visit: Payer: Self-pay | Admitting: Primary Care

## 2017-10-08 DIAGNOSIS — F32A Depression, unspecified: Secondary | ICD-10-CM

## 2017-10-08 DIAGNOSIS — E782 Mixed hyperlipidemia: Secondary | ICD-10-CM

## 2017-10-08 DIAGNOSIS — I10 Essential (primary) hypertension: Secondary | ICD-10-CM

## 2017-10-08 DIAGNOSIS — F329 Major depressive disorder, single episode, unspecified: Secondary | ICD-10-CM

## 2017-10-08 MED ORDER — ATORVASTATIN CALCIUM 80 MG PO TABS: 80.0000 mg | ORAL_TABLET | Freq: Every day | ORAL | 1 refills | Status: DC

## 2017-10-08 MED ORDER — AMLODIPINE BESYLATE 10 MG PO TABS: 10.0000 mg | ORAL_TABLET | Freq: Every day | ORAL | 1 refills | Status: DC

## 2017-10-08 MED ORDER — SERTRALINE HCL 50 MG PO TABS: 50.0000 mg | ORAL_TABLET | Freq: Every day | ORAL | 1 refills | Status: DC

## 2017-10-14 ENCOUNTER — Encounter: Payer: Self-pay | Admitting: Primary Care

## 2017-10-14 ENCOUNTER — Ambulatory Visit (INDEPENDENT_AMBULATORY_CARE_PROVIDER_SITE_OTHER): Payer: Medicare HMO | Admitting: Primary Care

## 2017-10-14 DIAGNOSIS — R053 Chronic cough: Secondary | ICD-10-CM

## 2017-10-14 DIAGNOSIS — I10 Essential (primary) hypertension: Secondary | ICD-10-CM

## 2017-10-14 DIAGNOSIS — R05 Cough: Secondary | ICD-10-CM | POA: Diagnosis not present

## 2017-10-14 NOTE — Assessment & Plan Note (Signed)
Improved since switching from lisinopril to losartan. Continue Symbicort, allergy medications, and omeprazole. Continue to monitor.

## 2017-10-14 NOTE — Patient Instructions (Addendum)
Continue taking losartan 100 mg and Amlodipine 10 mg for high blood pressure. Do not take lisinopril 40 mg.   We will see you soon! It was a pleasure to see you today!

## 2017-10-14 NOTE — Assessment & Plan Note (Signed)
Stable on losartan 100 mg and Amlodipine 10 mg, continue same. Check BMP during physical this Friday. Cough has resolved.

## 2017-10-14 NOTE — Progress Notes (Signed)
Subjective:    [name], male    DOB: 1945/08/05, 72 y.o.   MRN: [id number]  HPI  Alan [name] is a 72 year old male who presents today for follow up of hypertension and chronic cough.  He was last evaluated in late June 2019 with reports of continued cough. He is treated with Symbicort for COPD and omeprazole for GERD. His lisinopril was suspected to be the cause so it was switched to losartan.  Since his last visit his cough is better. He denies a cough for the past 2 weeks. He denies chest pain, dizziness. He's checking his BP at home which is running 120-130's/70's.   BP Readings from Last 3 Encounters:  10/14/17 122/68  09/20/17 136/80  07/10/17 124/82     Review of Systems  Eyes: Negative for visual disturbance.  Respiratory: Negative for cough and shortness of breath.   Cardiovascular: Negative for chest pain.  Neurological: Negative for dizziness and headaches.       Past Medical History:  Diagnosis Date  . Allergic rhinitis, cause unspecified   . Carpal tunnel syndrome   . Chronic airway obstruction, not elsewhere classified   . Esophageal reflux   . Glaucoma (increased eye pressure)   . Lipoma of unspecified site   . Osteoarthrosis, unspecified whether generalized or localized, lower leg   . Other and unspecified hyperlipidemia   . Personal history of colonic polyps   . Personal history of other malignant neoplasm of skin   . Unspecified essential hypertension      Social History   Socioeconomic History  . Marital status: Married    Spouse name: Not on file  . Number of children: 2  . Years of education: Not on file  . Highest education level: Not on file  Occupational History  . 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 Financial resource strain: Not on file  . Food insecurity:    Worry: Not on file    Inability: Not on file  . Transportation needs:    Medical: Not on file    Non-medical: Not on file  Tobacco  Use  . Smoking status: Former Smoker    Packs/day: 3.00    Years: 25.00    Pack years: 75.00    Last attempt to quit: 11/23/1996    Years since quitting: 20.9  . Smokeless tobacco: Never Used  Substance and Sexual Activity  . Alcohol use: Yes    Alcohol/week: 0.0 oz    Comment: 4 mixed drinks per week  . Drug use: No  . Sexual activity: Not Currently  Lifestyle  . Physical activity:    Days per week: Not on file    Minutes per session: Not on file  . Stress: Not on file  Relationships  . Social connections:    Talks on phone: Not on file    Gets together: Not on file    Attends religious service: Not on file    Active member of club or organization: Not on file    Attends meetings of clubs or organizations: Not on file    Relationship status: Not on file  . Intimate partner violence:    Fear of current or ex partner: Not on file    Emotionally abused: Not on file    Physically abused: Not on file    Forced sexual activity: Not on file  Other Topics Concern  . Not on file  Social History Narrative  Regular exercise -- no             Past Surgical History:  Procedure Laterality Date  . bone spur  06-2003   right thumb  . KNEE ARTHROSCOPY  09-2007   partial medial mesicecotmy, patellar chonfroplasty  . SHOULDER SURGERY  03-24-2007   removal bone spur  . SHOULDER SURGERY Left 07/2016   [place], Dr. Harmon Pier  . TOTAL KNEE ARTHROPLASTY     03-2009 [facility]  . TOTAL KNEE ARTHROPLASTY Left 12/2012   [facility]    Family History  Problem Relation Age of Onset  . Diabetes Mother   . Hyperlipidemia Mother   . Hypertension Mother   . Heart attack Mother   . Obesity Mother   . Hyperlipidemia Father   . Hypertension Father   . Lung cancer Father   . Colon polyps Father   . Hyperlipidemia Brother   . Hypertension Brother   . Hyperlipidemia Brother   . Hypertension Brother   . Hypertension Sister   . Hyperlipidemia Sister   . Lung cancer Sister     . Hyperlipidemia Sister   . Drug abuse Daughter     No Known Allergies  Current Outpatient Medications on File Prior to Visit  Medication Sig Dispense Refill  . albuterol (PROVENTIL HFA;VENTOLIN HFA) 108 (90 Base) MCG/ACT inhaler Inhale 2 puffs into the lungs every 4 (four) hours as needed for wheezing or shortness of breath (cough, shortness of breath or wheezing.). 1 Inhaler 1  . amLODipine (NORVASC) 10 MG tablet Take 1 tablet (10 mg total) by mouth daily. 90 tablet 1  . aspirin 81 MG tablet Take 81 mg by mouth daily.      Marland Kitchen atorvastatin (LIPITOR) 80 MG tablet Take 1 tablet (80 mg total) by mouth daily. 90 tablet 1  . budesonide-formoterol (SYMBICORT) 160-4.5 MCG/ACT inhaler Inhale 2 puffs into the lungs 2 (two) times daily. 1 Inhaler 3  . cetirizine (ZYRTEC) 10 MG tablet Take 1 tablet by mouth daily as needed for allergies. 90 tablet 0  . fish oil-omega-3 fatty acids 1000 MG capsule Take 2 g by mouth daily.      Marland Kitchen losartan (COZAAR) 100 MG tablet TAKE 1 TABLET BY MOUTH ONCE DAILY FOR BLOOD PRESSURE 90 tablet 1  . meloxicam (MOBIC) 15 MG tablet Take 1 tablet (15 mg total) by mouth daily. 30 tablet 0  . montelukast (SINGULAIR) 10 MG tablet Take 1 tablet (10 mg total) by mouth at bedtime. 90 tablet 3  . Multiple Vitamin (MULTIVITAMIN) tablet Take 1 tablet by mouth daily.      Marland Kitchen omeprazole (PRILOSEC) 20 MG capsule Take 20 mg by mouth daily.      . sertraline (ZOLOFT) 50 MG tablet Take 1 tablet (50 mg total) by mouth daily. 90 tablet 1  . tiZANidine (ZANAFLEX) 4 MG tablet Take 1 tablet (4 mg total) by mouth every 8 (eight) hours as needed for muscle spasms. 30 tablet 0  . triamcinolone cream (KENALOG) 0.1 % Apply 1 application topically 2 (two) times daily. 30 g 0   No current facility-administered medications on file prior to visit.     BP 122/68   Pulse 77   Temp 98.1 F (36.7 C) (Oral)   Ht 5' 9.5" (1.765 m)   Wt 261 lb 8 oz (118.6 kg)   SpO2 98%   BMI 38.06 kg/m    Objective:    Physical Exam  Constitutional: He appears well-nourished.  Neck: Neck supple.  Cardiovascular: Normal rate and regular rhythm.  Respiratory: Effort normal and breath sounds normal.  Skin: Skin is warm and dry.           Assessment & Plan:

## 2017-10-15 ENCOUNTER — Other Ambulatory Visit: Payer: Self-pay | Admitting: Primary Care

## 2017-10-15 DIAGNOSIS — R7303 Prediabetes: Secondary | ICD-10-CM

## 2017-10-15 DIAGNOSIS — I10 Essential (primary) hypertension: Secondary | ICD-10-CM

## 2017-10-15 DIAGNOSIS — Z125 Encounter for screening for malignant neoplasm of prostate: Secondary | ICD-10-CM

## 2017-10-18 ENCOUNTER — Ambulatory Visit (INDEPENDENT_AMBULATORY_CARE_PROVIDER_SITE_OTHER): Payer: Medicare HMO

## 2017-10-18 VITALS — BP 122/76 | HR 86 | Temp 97.9°F | Ht 70.5 in | Wt 264.2 lb

## 2017-10-18 DIAGNOSIS — I10 Essential (primary) hypertension: Secondary | ICD-10-CM

## 2017-10-18 DIAGNOSIS — Z0000 Encounter for general adult medical examination without abnormal findings: Secondary | ICD-10-CM | POA: Diagnosis not present

## 2017-10-18 DIAGNOSIS — R7303 Prediabetes: Secondary | ICD-10-CM | POA: Diagnosis not present

## 2017-10-18 DIAGNOSIS — Z125 Encounter for screening for malignant neoplasm of prostate: Secondary | ICD-10-CM | POA: Diagnosis not present

## 2017-10-18 LAB — BASIC METABOLIC PANEL
BUN: 21 mg/dL (ref 6–23)
CO2: 31 meq/L (ref 19–32)
Calcium: 9.6 mg/dL (ref 8.4–10.5)
Chloride: 102 mEq/L (ref 96–112)
Creatinine, Ser: 0.79 mg/dL (ref 0.40–1.50)
GFR: 102.68 mL/min (ref >60.00)
GLUCOSE: 106 mg/dL — AB (ref 70–99)
POTASSIUM: 4.2 meq/L (ref 3.5–5.1)
SODIUM: 141 meq/L (ref 135–145)

## 2017-10-18 LAB — PSA, MEDICARE: PSA: 1.52 ng/mL (ref 0.10–4.00)

## 2017-10-18 LAB — HEMOGLOBIN A1C: Hgb A1c MFr Bld: 5.7 % (ref 4.6–6.5)

## 2017-10-18 NOTE — Patient Instructions (Addendum)
Alan [name] , Thank you for taking time to come for your Medicare Wellness Visit. I appreciate your ongoing commitment to your health goals. Please review the following plan we discussed and let me know if I can assist you in the future.   These are the goals we discussed: Goals    . Patient Stated     Starting 10/18/2017, I will continue to take medications as prescribed.        This is a list of the screening recommended for you and due dates:  Health Maintenance  Topic Date Due  . Colon Cancer Screening  11/30/2017*  . Flu Shot  10/31/2017  . Tetanus Vaccine  10/16/2026  .  Hepatitis C: One time screening is recommended by Center for Disease Control  (CDC) for  adults born from [birth year] through 1964.   Completed  . Pneumonia vaccines  Completed  *Topic was postponed. The date shown is not the original due date.   Preventive Care for Adults  A healthy lifestyle and preventive care can promote health and wellness. Preventive health guidelines for adults include the following key practices.  . A routine yearly physical is a good way to check with your health care provider about your health and preventive screening. It is a chance to share any concerns and updates on your health and to receive a thorough exam.  . Visit your dentist for a routine exam and preventive care every 6 months. Brush your teeth twice a day and floss once a day. Good oral hygiene prevents tooth decay and gum disease.  . The frequency of eye exams is based on your age, health, family medical history, use  of contact lenses, and other factors. Follow your health care provider's recommendations for frequency of eye exams.  . Eat a healthy diet. Foods like vegetables, fruits, whole grains, low-fat dairy products, and lean protein foods contain the nutrients you need without too many calories. Decrease your intake of foods high in solid fats, added sugars, and salt. Eat the right amount of calories for you. Get  information about a proper diet from your health care provider, if necessary.  . Regular physical exercise is one of the most important things you can do for your health. Most adults should get at least 150 minutes of moderate-intensity exercise (any activity that increases your heart rate and causes you to sweat) each week. In addition, most adults need muscle-strengthening exercises on 2 or more days a week.  Silver Sneakers may be a benefit available to you. To determine eligibility, you may visit the website: www.silversneakers.com or contact program at 539-249-2056 Mon-Fri between 8AM-8PM.   . Maintain a healthy weight. The body mass index (BMI) is a screening tool to identify possible weight problems. It provides an estimate of body fat based on height and weight. Your health care provider can find your BMI and can help you achieve or maintain a healthy weight.   For adults 20 years and older: ? A BMI below 18.5 is considered underweight. ? A BMI of 18.5 to 24.9 is normal. ? A BMI of 25 to 29.9 is considered overweight. ? A BMI of 30 and above is considered obese.   . Maintain normal blood lipids and cholesterol levels by exercising and minimizing your intake of saturated fat. Eat a balanced diet with plenty of fruit and vegetables. Blood tests for lipids and cholesterol should begin at age 27 and be repeated every 5 years. If your lipid or cholesterol levels  are high, you are over 50, or you are at high risk for heart disease, you may need your cholesterol levels checked more frequently. Ongoing high lipid and cholesterol levels should be treated with medicines if diet and exercise are not working.  . If you smoke, find out from your health care provider how to quit. If you do not use tobacco, please do not start.  . If you choose to drink alcohol, please do not consume more than 2 drinks per day. One drink is considered to be 12 ounces (355 mL) of beer, 5 ounces (148 mL) of wine, or 1.5  ounces (44 mL) of liquor.  . If you are 2-63 years old, ask your health care provider if you should take aspirin to prevent strokes.  . Use sunscreen. Apply sunscreen liberally and repeatedly throughout the day. You should seek shade when your shadow is shorter than you. Protect yourself by wearing long sleeves, pants, a wide-brimmed hat, and sunglasses year round, whenever you are outdoors.  . Once a month, do a whole body skin exam, using a mirror to look at the skin on your back. Tell your health care provider of new moles, moles that have irregular borders, moles that are larger than a pencil eraser, or moles that have changed in shape or color.

## 2017-10-18 NOTE — Progress Notes (Signed)
PCP notes:   Health maintenance:  Colonoscopy - future appt scheduled  Abnormal screenings:   Hearing - failed  Hearing Screening   125Hz  250Hz  500Hz  1000Hz  2000Hz  3000Hz  4000Hz  6000Hz  8000Hz   Right ear:   40 40 40  0    Left ear:   40 40 40  0     Patient concerns:   None  Nurse concerns:  None  Next PCP appt:   10/25/17 @ 1020

## 2017-10-18 NOTE — Progress Notes (Signed)
I reviewed health advisor's note, was available for consultation, and agree with documentation and plan.  

## 2017-10-18 NOTE — Progress Notes (Signed)
Subjective:   Alan [name] is a 72 y.o. male who presents for Medicare Annual/Subsequent preventive examination.  Review of Systems:  N/A Cardiac Risk Factors include: advanced age (>7men, >60 women);dyslipidemia;hypertension;obesity (BMI >30kg/m2);male gender     Objective:    Vitals: BP 122/76 (BP Location: Right Arm, Patient Position: Sitting, Cuff Size: Normal)   Pulse 86   Temp 97.9 F (36.6 C) (Oral)   Ht 5' 10.5" (1.791 m) Comment: shoes  Wt 264 lb 4 oz (119.9 kg)   SpO2 97%   BMI 37.38 kg/m   Body mass index is 37.38 kg/m.  Advanced Directives 10/18/2017 10/12/2016  Does Patient Have a Medical Advance Directive? No No  Does patient want to make changes to medical advance directive? - Yes (MAU/Ambulatory/Procedural Areas - Information given)  Would patient like information on creating a medical advance directive? No - Patient declined -    Tobacco Social History   Tobacco Use  Smoking Status Former Smoker  . Packs/day: 3.00  . Years: 25.00  . Pack years: 75.00  . Last attempt to quit: 11/23/1996  . Years since quitting: 20.9  Smokeless Tobacco Never Used     Counseling given: No   Clinical Intake:  Pre-visit preparation completed: Yes  Pain : No/denies pain Pain Score: 0-No pain     Nutritional Status: BMI > 30  Obese Nutritional Risks: None Diabetes: No  How often do you need to have someone help you when you read instructions, pamphlets, or other written materials from your doctor or pharmacy?: 1 - Never What is the last grade level you completed in school?: 12th grade  Interpreter Needed?: No  Comments: pt is divorced and lives alone Information entered by :: LPinson, LPN  Past Medical History:  Diagnosis Date  . Allergic rhinitis, cause unspecified   . Carpal tunnel syndrome   . Chronic airway obstruction, not elsewhere classified   . Esophageal reflux   . Glaucoma (increased eye pressure)   . Lipoma of unspecified site   .  Osteoarthrosis, unspecified whether generalized or localized, lower leg   . Other and unspecified hyperlipidemia   . Personal history of colonic polyps   . Personal history of other malignant neoplasm of skin   . Unspecified essential hypertension    Past Surgical History:  Procedure Laterality Date  . bone spur  06-2003   right thumb  . KNEE ARTHROSCOPY  09-2007   partial medial mesicecotmy, patellar chonfroplasty  . SHOULDER SURGERY  03-24-2007   removal bone spur  . SHOULDER SURGERY Left 07/2016   [place], Dr. Harmon Pier  . TOTAL KNEE ARTHROPLASTY     03-2009 [facility]  . TOTAL KNEE ARTHROPLASTY Left 12/2012   [facility]   Family History  Problem Relation Age of Onset  . Diabetes Mother   . Hyperlipidemia Mother   . Hypertension Mother   . Heart attack Mother   . Obesity Mother   . Hyperlipidemia Father   . Hypertension Father   . Lung cancer Father   . Colon polyps Father   . Hyperlipidemia Brother   . Hypertension Brother   . Hyperlipidemia Brother   . Hypertension Brother   . Hypertension Sister   . Hyperlipidemia Sister   . Lung cancer Sister   . Hyperlipidemia Sister   . Drug abuse Daughter    Social History   Socioeconomic History  . Marital status: Married    Spouse name: Not on file  . Number of children: 2  .  Years of education: Not on file  . Highest education level: Not on file  Occupational History  . 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 Financial resource strain: Not on file  . Food insecurity:    Worry: Not on file    Inability: Not on file  . Transportation needs:    Medical: Not on file    Non-medical: Not on file  Tobacco Use  . Smoking status: Former Smoker    Packs/day: 3.00    Years: 25.00    Pack years: 75.00    Last attempt to quit: 11/23/1996    Years since quitting: 20.9  . Smokeless tobacco: Never Used  Substance and Sexual Activity  . Alcohol use: Yes    Alcohol/week: 2.4 oz    Types: 4  Standard drinks or equivalent per week    Comment: 4 mixed drinks per week  . Drug use: No  . Sexual activity: Not Currently  Lifestyle  . Physical activity:    Days per week: Not on file    Minutes per session: Not on file  . Stress: Not on file  Relationships  . Social connections:    Talks on phone: Not on file    Gets together: Not on file    Attends religious service: Not on file    Active member of club or organization: Not on file    Attends meetings of clubs or organizations: Not on file    Relationship status: Not on file  Other Topics Concern  . Not on file  Social History Narrative   Regular exercise -- no             Outpatient Encounter Medications as of 10/18/2017  Medication Sig  . albuterol (PROVENTIL HFA;VENTOLIN HFA) 108 (90 Base) MCG/ACT inhaler Inhale 2 puffs into the lungs every 4 (four) hours as needed for wheezing or shortness of breath (cough, shortness of breath or wheezing.).  Marland Kitchen amLODipine (NORVASC) 10 MG tablet Take 1 tablet (10 mg total) by mouth daily.  Marland Kitchen aspirin 81 MG tablet Take 81 mg by mouth daily.    Marland Kitchen atorvastatin (LIPITOR) 80 MG tablet Take 1 tablet (80 mg total) by mouth daily.  . budesonide-formoterol (SYMBICORT) 160-4.5 MCG/ACT inhaler Inhale 2 puffs into the lungs 2 (two) times daily.  . cetirizine (ZYRTEC) 10 MG tablet Take 1 tablet by mouth daily as needed for allergies.  . fish oil-omega-3 fatty acids 1000 MG capsule Take 2 g by mouth daily.    Marland Kitchen losartan (COZAAR) 100 MG tablet TAKE 1 TABLET BY MOUTH ONCE DAILY FOR BLOOD PRESSURE  . montelukast (SINGULAIR) 10 MG tablet Take 1 tablet (10 mg total) by mouth at bedtime.  . Multiple Vitamin (MULTIVITAMIN) tablet Take 1 tablet by mouth daily.    Marland Kitchen omeprazole (PRILOSEC) 20 MG capsule Take 20 mg by mouth daily.    . sertraline (ZOLOFT) 50 MG tablet Take 1 tablet (50 mg total) by mouth daily.  Marland Kitchen tiZANidine (ZANAFLEX) 4 MG tablet Take 1 tablet (4 mg total) by mouth every 8 (eight) hours as  needed for muscle spasms.  Marland Kitchen triamcinolone cream (KENALOG) 0.1 % Apply 1 application topically 2 (two) times daily.  . [DISCONTINUED] meloxicam (MOBIC) 15 MG tablet Take 1 tablet (15 mg total) by mouth daily.   No facility-administered encounter medications on file as of 10/18/2017.     Activities of Daily Living In your present state of health, do you have any difficulty performing the following activities:  10/18/2017  Hearing? N  Vision? N  Difficulty concentrating or making decisions? N  Walking or climbing stairs? Y  Dressing or bathing? N  Doing errands, shopping? N  Preparing Food and eating ? N  Using the Toilet? N  In the past six months, have you accidently leaked urine? Y  Do you have problems with loss of bowel control? N  Managing your Medications? N  Managing your Finances? N  Housekeeping or managing your Housekeeping? N  Some recent data might be hidden    Patient Care Team: Pleas Koch, NP as PCP - General (Nurse Practitioner) Warden Fillers, MD as Consulting Physician (Ophthalmology) Garrel Ridgel, [place] as Consulting Physician (Podiatry) Marchia Bond, MD as Consulting Physician (Orthopedic Surgery)   Assessment:   This is a routine wellness examination for Riordan.   Hearing Screening   125Hz  250Hz  500Hz  1000Hz  2000Hz  3000Hz  4000Hz  6000Hz  8000Hz   Right ear:   40 40 40  0    Left ear:   40 40 40  0    Vision Screening Comments: Vision exam in 2019 with Dr. Doyle Askew    Exercise Activities and Dietary recommendations Current Exercise Habits: The patient does not participate in regular exercise at present, Exercise limited by: orthopedic condition(s)  Goals    . Patient Stated     Starting 10/18/2017, I will continue to take medications as prescribed.        Fall Risk Fall Risk  10/18/2017 10/12/2016 05/03/2014 04/27/2013  Falls in the past year? No No No No   Depression Screen PHQ 2/9 Scores 10/18/2017 10/12/2016 05/06/2015 05/03/2014  PHQ -  2 Score 0 0 1 0  PHQ- 9 Score 0 - - -    Cognitive Function MMSE - Mini Mental State Exam 10/18/2017 10/12/2016  Orientation to time 5 5  Orientation to Place 5 5  Registration 3 3  Attention/ Calculation 0 0  Recall 3 1  Language- name 2 objects 0 0  Language- repeat 1 1  Language- follow 3 step command 3 3  Language- read & follow direction 0 0  Write a sentence 0 0  Copy design 0 0  Total score 20 18       PLEASE NOTE: A Mini-Cog screen was completed. Maximum score is 20. A value of 0 denotes this part of Folstein MMSE was not completed or the patient failed this part of the Mini-Cog screening.   Mini-Cog Screening Orientation to Time - Max 5 pts Orientation to Place - Max 5 pts Registration - Max 3 pts Recall - Max 3 pts Language Repeat - Max 1 pts Language Follow 3 Step Command - Max 3 pts   Immunization History  Administered Date(s) Administered  . Influenza Split 03/02/2011, 02/28/2013  . Influenza Whole 01/24/2007, 01/06/2008, 02/02/2009  . Influenza, High Dose Seasonal PF 12/26/2016  . Influenza,inj,Quad PF,6+ Mos 12/02/2015  . Influenza-Unspecified 03/22/2014, 01/05/2015  . Pneumococcal Conjugate-13 05/03/2014  . Pneumococcal Polysaccharide-23 12/05/2011  . Td 06/13/2006  . Tdap 10/15/2016  . Zoster 09/03/2006    Screening Tests Health Maintenance  Topic Date Due  . COLONOSCOPY  11/30/2017 (Originally 08/05/2017)  . INFLUENZA VACCINE  10/31/2017  . TETANUS/TDAP  10/16/2026  . Hepatitis C Screening  Completed  . PNA vac Low Risk Adult  Completed      Plan:     I have personally reviewed, addressed, and noted the following in the patient's chart:  A. Medical and social history B. Use of alcohol,  tobacco or illicit drugs  C. Current medications and supplements D. Functional ability and status E.  Nutritional status F.  Physical activity G. Advance directives H. List of other physicians I.  Hospitalizations, surgeries, and ER visits in previous 12  months J.  [place] to include hearing, vision, cognitive, depression L. Referrals and appointments - none  In addition, I have reviewed and discussed with patient certain preventive protocols, quality metrics, and best practice recommendations. A written personalized care plan for preventive services as well as general preventive health recommendations were provided to patient.  See attached scanned questionnaire for additional information.   Signed,   Lindell Noe, MHA, BS, LPN Health Coach

## 2017-10-25 ENCOUNTER — Encounter: Payer: Self-pay | Admitting: Primary Care

## 2017-10-25 ENCOUNTER — Ambulatory Visit (INDEPENDENT_AMBULATORY_CARE_PROVIDER_SITE_OTHER): Payer: Medicare HMO | Admitting: Primary Care

## 2017-10-25 ENCOUNTER — Ambulatory Visit: Payer: PPO | Admitting: Primary Care

## 2017-10-25 VITALS — BP 120/70 | HR 86 | Temp 98.0°F | Ht 70.5 in | Wt 261.2 lb

## 2017-10-25 DIAGNOSIS — M545 Low back pain, unspecified: Secondary | ICD-10-CM

## 2017-10-25 DIAGNOSIS — J3089 Other allergic rhinitis: Secondary | ICD-10-CM

## 2017-10-25 DIAGNOSIS — J449 Chronic obstructive pulmonary disease, unspecified: Secondary | ICD-10-CM | POA: Diagnosis not present

## 2017-10-25 DIAGNOSIS — G8929 Other chronic pain: Secondary | ICD-10-CM

## 2017-10-25 DIAGNOSIS — K219 Gastro-esophageal reflux disease without esophagitis: Secondary | ICD-10-CM

## 2017-10-25 DIAGNOSIS — F3342 Major depressive disorder, recurrent, in full remission: Secondary | ICD-10-CM

## 2017-10-25 DIAGNOSIS — R053 Chronic cough: Secondary | ICD-10-CM

## 2017-10-25 DIAGNOSIS — I10 Essential (primary) hypertension: Secondary | ICD-10-CM | POA: Diagnosis not present

## 2017-10-25 DIAGNOSIS — Z86010 Personal history of colonic polyps: Secondary | ICD-10-CM | POA: Diagnosis not present

## 2017-10-25 DIAGNOSIS — R05 Cough: Secondary | ICD-10-CM

## 2017-10-25 DIAGNOSIS — Z0000 Encounter for general adult medical examination without abnormal findings: Secondary | ICD-10-CM | POA: Diagnosis not present

## 2017-10-25 DIAGNOSIS — R69 Illness, unspecified: Secondary | ICD-10-CM | POA: Diagnosis not present

## 2017-10-25 DIAGNOSIS — E785 Hyperlipidemia, unspecified: Secondary | ICD-10-CM

## 2017-10-25 DIAGNOSIS — Z0001 Encounter for general adult medical examination with abnormal findings: Secondary | ICD-10-CM | POA: Insufficient documentation

## 2017-10-25 DIAGNOSIS — R7303 Prediabetes: Secondary | ICD-10-CM

## 2017-10-25 NOTE — Assessment & Plan Note (Signed)
LDL in March 2019 at goal. Continue atorvastatin.

## 2017-10-25 NOTE — Assessment & Plan Note (Signed)
Immunizations UTD. Colonoscopy due next week. PSA UTD. Discussed the importance of a healthy diet and regular exercise in order for weight loss, and to reduce the risk of any potential medical problems. Exam unremarkable. Labs stable. Follow up in 1 year for CPE.

## 2017-10-25 NOTE — Assessment & Plan Note (Signed)
Compliant to Symbicort, infrequent use of albuterol inhaler. Lungs clear on exam today.

## 2017-10-25 NOTE — Assessment & Plan Note (Signed)
No recent symptoms. Continue omeprazole 20 mg.

## 2017-10-25 NOTE — Progress Notes (Signed)
Subjective:    [name], male    DOB: 12/28/1945, 71 y.o.   MRN: [id number]  HPI  Alan [name] is a 72 year old male who presents today for complete physical.  Immunizations: -Influenza: Completed last season Tetanus: Completed in 2018 -Pneumonia: Completed in 2016 -Shingles: Completed in 2008  Diet: He endorses a fair diet Breakfast: [place] sausage wrap with cheese Lunch: Skips Dinner: Chicken breast, salad, pasta, steak Snacks: None Desserts: Occasionally frozen yogurt Beverages: Coffee, water, occasional sweet tea  Exercise: He is not exercising Eye exam: Completed in April 2019 Dental exam: Completes annually  Colonoscopy: Completed in 2014, due next month.  PSA: Normal in 2019 Hep C Screen: Negative in 2017  BP Readings from Last 3 Encounters:  10/25/17 120/70  10/18/17 122/76  10/14/17 122/68     Review of Systems  Constitutional: Negative for unexpected weight change.  HENT: Negative for rhinorrhea.   Respiratory: Negative for cough and shortness of breath.   Cardiovascular: Negative for chest pain.  Gastrointestinal: Negative for constipation and diarrhea.  Genitourinary: Negative for difficulty urinating.  Musculoskeletal: Positive for arthralgias and back pain. Negative for myalgias.  Skin: Negative for rash.  Allergic/Immunologic: Positive for environmental allergies.  Neurological: Negative for dizziness, numbness and headaches.  Psychiatric/Behavioral:       Doing well on Zoloft, denies SI/HI       Past Medical History:  Diagnosis Date  . Allergic rhinitis, cause unspecified   . Carpal tunnel syndrome   . Chronic airway obstruction, not elsewhere classified   . Esophageal reflux   . Glaucoma (increased eye pressure)   . Lipoma of unspecified site   . Osteoarthrosis, unspecified whether generalized or localized, lower leg   . Other and unspecified hyperlipidemia   . Personal history of colonic polyps   . Personal history  of other malignant neoplasm of skin   . Unspecified essential hypertension      Social History   Socioeconomic History  . Marital status: Married    Spouse name: Not on file  . Number of children: 2  . Years of education: Not on file  . Highest education level: Not on file  Occupational History  . 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 Financial resource strain: Not on file  . Food insecurity:    Worry: Not on file    Inability: Not on file  . Transportation needs:    Medical: Not on file    Non-medical: Not on file  Tobacco Use  . Smoking status: Former Smoker    Packs/day: 3.00    Years: 25.00    Pack years: 75.00    Last attempt to quit: 11/23/1996    Years since quitting: 20.9  . Smokeless tobacco: Never Used  Substance and Sexual Activity  . Alcohol use: Yes    Alcohol/week: 2.4 oz    Types: 4 Standard drinks or equivalent per week    Comment: 4 mixed drinks per week  . Drug use: No  . Sexual activity: Not Currently  Lifestyle  . Physical activity:    Days per week: Not on file    Minutes per session: Not on file  . Stress: Not on file  Relationships  . Social connections:    Talks on phone: Not on file    Gets together: Not on file    Attends religious service: Not on file    Active member of club or organization: Not on file  Attends meetings of clubs or organizations: Not on file    Relationship status: Not on file  . Intimate partner violence:    Fear of current or ex partner: Not on file    Emotionally abused: Not on file    Physically abused: Not on file    Forced sexual activity: Not on file  Other Topics Concern  . Not on file  Social History Narrative   Regular exercise -- no             Past Surgical History:  Procedure Laterality Date  . bone spur  06-2003   right thumb  . KNEE ARTHROSCOPY  09-2007   partial medial mesicecotmy, patellar chonfroplasty  . SHOULDER SURGERY  03-24-2007   removal bone spur  .  SHOULDER SURGERY Left 07/2016   [facility], Dr. Harmon Pier  . TOTAL KNEE ARTHROPLASTY     03-2009 [facility]  . TOTAL KNEE ARTHROPLASTY Left 12/2012   [facility]    Family History  Problem Relation Age of Onset  . Diabetes Mother   . Hyperlipidemia Mother   . Hypertension Mother   . Heart attack Mother   . Obesity Mother   . Hyperlipidemia Father   . Hypertension Father   . Lung cancer Father   . Colon polyps Father   . Hyperlipidemia Brother   . Hypertension Brother   . Hyperlipidemia Brother   . Hypertension Brother   . Hypertension Sister   . Hyperlipidemia Sister   . Lung cancer Sister   . Hyperlipidemia Sister   . Drug abuse Daughter     No Known Allergies  Current Outpatient Medications on File Prior to Visit  Medication Sig Dispense Refill  . albuterol (PROVENTIL HFA;VENTOLIN HFA) 108 (90 Base) MCG/ACT inhaler Inhale 2 puffs into the lungs every 4 (four) hours as needed for wheezing or shortness of breath (cough, shortness of breath or wheezing.). 1 Inhaler 1  . amLODipine (NORVASC) 10 MG tablet Take 1 tablet (10 mg total) by mouth daily. 90 tablet 1  . aspirin 81 MG tablet Take 81 mg by mouth daily.      Marland Kitchen atorvastatin (LIPITOR) 80 MG tablet Take 1 tablet (80 mg total) by mouth daily. 90 tablet 1  . budesonide-formoterol (SYMBICORT) 160-4.5 MCG/ACT inhaler Inhale 2 puffs into the lungs 2 (two) times daily. 1 Inhaler 3  . cetirizine (ZYRTEC) 10 MG tablet Take 1 tablet by mouth daily as needed for allergies. 90 tablet 0  . fish oil-omega-3 fatty acids 1000 MG capsule Take 2 g by mouth daily.      Marland Kitchen losartan (COZAAR) 100 MG tablet TAKE 1 TABLET BY MOUTH ONCE DAILY FOR BLOOD PRESSURE 90 tablet 1  . montelukast (SINGULAIR) 10 MG tablet Take 1 tablet (10 mg total) by mouth at bedtime. 90 tablet 3  . Multiple Vitamin (MULTIVITAMIN) tablet Take 1 tablet by mouth daily.      Marland Kitchen omeprazole (PRILOSEC) 20 MG capsule Take 20 mg by mouth daily.      . sertraline (ZOLOFT)  50 MG tablet Take 1 tablet (50 mg total) by mouth daily. 90 tablet 1  . tiZANidine (ZANAFLEX) 4 MG tablet Take 1 tablet (4 mg total) by mouth every 8 (eight) hours as needed for muscle spasms. 30 tablet 0  . triamcinolone cream (KENALOG) 0.1 % Apply 1 application topically 2 (two) times daily. 30 g 0   No current facility-administered medications on file prior to visit.     BP 120/70 (BP  Location: Left Arm, Patient Position: Sitting, Cuff Size: Large)   Pulse 86   Temp 98 F (36.7 C) (Oral)   Ht 5' 10.5" (1.791 m)   Wt 261 lb 4 oz (118.5 kg)   SpO2 96%   BMI 36.96 kg/m    Objective:   Physical Exam  Constitutional: He is oriented to person, place, and time. He appears well-nourished.  HENT:  Mouth/Throat: No oropharyngeal exudate.  Eyes: Pupils are equal, round, and reactive to light. EOM are normal.  Neck: Neck supple. No thyromegaly present.  Cardiovascular: Normal rate and regular rhythm.  Respiratory: Effort normal and breath sounds normal.  GI: Soft. Bowel sounds are normal. There is no tenderness.  Musculoskeletal: Normal range of motion.  Neurological: He is alert and oriented to person, place, and time.  Skin: Skin is warm and dry.  Psychiatric: He has a normal mood and affect.           Assessment & Plan:

## 2017-10-25 NOTE — Assessment & Plan Note (Signed)
Due for repeat colonoscopy next month.

## 2017-10-25 NOTE — Assessment & Plan Note (Signed)
Stable in the office today, continue current regimen. BMP unremarkable.  

## 2017-10-25 NOTE — Assessment & Plan Note (Signed)
Recent A1C of 5.7. Continue to monitor. Discussed the importance of a healthy diet and regular exercise in order for weight loss, and to reduce the risk of any potential medical problems.

## 2017-10-25 NOTE — Assessment & Plan Note (Signed)
Doing well, no recent cough. Continue PPI, antihistamines, Symbicort.

## 2017-10-25 NOTE — Assessment & Plan Note (Signed)
Stable overall, continue Singulair and Zyrtec.

## 2017-10-25 NOTE — Assessment & Plan Note (Signed)
Doing well on Zoloft, continue same. 

## 2017-10-25 NOTE — Assessment & Plan Note (Signed)
Intermittent, overall doing better since flare several months ago. Does have moderate osteoarthritis. Continue to work on stretching and exercises.

## 2017-10-25 NOTE — Patient Instructions (Addendum)
Make sure to eat a healthy diet with plenty of vegetables, fruit, whole grains, lean protein.   Ensure you are consuming 64 ounces of water daily.  Start exercising. You should be getting 150 minutes of moderate intensity exercise weekly.  We will see you in 1 year for your annual exam or sooner if needed.  It was a pleasure to see you today!   Preventive Care 71 Years and Older, Male Preventive care refers to lifestyle choices and visits with your health care provider that can promote health and wellness. What does preventive care include?  A yearly physical exam. This is also called an annual well check.  Dental exams once or twice a year.  Routine eye exams. Ask your health care provider how often you should have your eyes checked.  Personal lifestyle choices, including: ? Daily care of your teeth and gums. ? Regular physical activity. ? Eating a healthy diet. ? Avoiding tobacco and drug use. ? Limiting alcohol use. ? Practicing safe sex. ? Taking low doses of aspirin every day. ? Taking vitamin and mineral supplements as recommended by your health care provider. What happens during an annual well check? The services and screenings done by your health care provider during your annual well check will depend on your age, overall health, lifestyle risk factors, and family history of disease. Counseling Your health care provider may ask you questions about your:  Alcohol use.  Tobacco use.  Drug use.  Emotional well-being.  Home and relationship well-being.  Sexual activity.  Eating habits.  History of falls.  Memory and ability to understand (cognition).  Work and work Statistician.  Screening You may have the following tests or measurements:  Height, weight, and BMI.  Blood pressure.  Lipid and cholesterol levels. These may be checked every 5 years, or more frequently if you are over 71 years old.  Skin check.  Lung cancer screening. You may have this  screening every year starting at age 17 if you have a 30-pack-year history of smoking and currently smoke or have quit within the past 15 years.  Fecal occult blood test (FOBT) of the stool. You may have this test every year starting at age 71.  Flexible sigmoidoscopy or colonoscopy. You may have a sigmoidoscopy every 5 years or a colonoscopy every 10 years starting at age 71.  Prostate cancer screening. Recommendations will vary depending on your family history and other risks.  Hepatitis C blood test.  Hepatitis B blood test.  Sexually transmitted disease (STD) testing.  Diabetes screening. This is done by checking your blood sugar (glucose) after you have not eaten for a while (fasting). You may have this done every 1-3 years.  Abdominal aortic aneurysm (AAA) screening. You may need this if you are a current or former smoker.  Osteoporosis. You may be screened starting at age 71 if you are at high risk.  Talk with your health care provider about your test results, treatment options, and if necessary, the need for more tests. Vaccines Your health care provider may recommend certain vaccines, such as:  Influenza vaccine. This is recommended every year.  Tetanus, diphtheria, and acellular pertussis (Tdap, Td) vaccine. You may need a Td booster every 10 years.  Varicella vaccine. You may need this if you have not been vaccinated.  Zoster vaccine. You may need this after age 71.  Measles, mumps, and rubella (MMR) vaccine. You may need at least one dose of MMR if you were born in 1957 or  later. You may also need a second dose.  Pneumococcal 13-valent conjugate (PCV13) vaccine. One dose is recommended after age 71.  Pneumococcal polysaccharide (PPSV23) vaccine. One dose is recommended after age 71.  Meningococcal vaccine. You may need this if you have certain conditions.  Hepatitis A vaccine. You may need this if you have certain conditions or if you travel or work in places where  you may be exposed to hepatitis A.  Hepatitis B vaccine. You may need this if you have certain conditions or if you travel or work in places where you may be exposed to hepatitis B.  Haemophilus influenzae type b (Hib) vaccine. You may need this if you have certain risk factors.  Talk to your health care provider about which screenings and vaccines you need and how often you need them. This information is not intended to replace advice given to you by your health care provider. Make sure you discuss any questions you have with your health care provider. Document Released: 04/15/2015 Document Revised: 12/07/2015 Document Reviewed: 01/18/2015 Elsevier Interactive Patient Education  [employer].

## 2017-10-30 ENCOUNTER — Ambulatory Visit (AMBULATORY_SURGERY_CENTER): Payer: Self-pay | Admitting: *Deleted

## 2017-10-30 VITALS — Ht 72.0 in | Wt 264.2 lb

## 2017-10-30 DIAGNOSIS — Z86010 Personal history of colonic polyps: Secondary | ICD-10-CM

## 2017-10-30 MED ORDER — SUPREP BOWEL PREP KIT 17.5-3.13-1.6 GM/177ML PO SOLN: 1.0000 | Freq: Once | ORAL | 0 refills | Status: AC

## 2017-10-30 NOTE — Progress Notes (Signed)
No egg or soy allergy known to patient  No issues with past sedation with any surgeries  or procedures, no intubation problems  No diet pills per patient No home 02 use per patient  No blood thinners per patient  Pt denies issues with constipation  No A fib or A flutter  EMMI video sent to pt's e mail  

## 2017-10-31 ENCOUNTER — Telehealth: Payer: Self-pay | Admitting: Internal Medicine

## 2017-10-31 ENCOUNTER — Encounter: Payer: Self-pay | Admitting: Internal Medicine

## 2017-10-31 NOTE — Telephone Encounter (Signed)
Called [facility]- prep price is 99.98- I asked about using a PNM $50 coupon- the pharmacist states he attempted this coupon and It was kicked out and he ran it as cash and the price went up to $110.  I called the pt- he states he is on a fixed income and he cannot afford this price-  I instructed pt I have a sample of Suprep he can pick up on 3rd floor at the front desk . He was very thankful   Lelan Pons PV

## 2017-11-06 ENCOUNTER — Telehealth: Payer: Self-pay | Admitting: Primary Care

## 2017-11-06 DIAGNOSIS — R21 Rash and other nonspecific skin eruption: Secondary | ICD-10-CM

## 2017-11-06 MED ORDER — TRIAMCINOLONE ACETONIDE 0.1 % EX CREA: 1.0000 "application " | TOPICAL_CREAM | Freq: Two times a day (BID) | CUTANEOUS | 0 refills | Status: DC

## 2017-11-06 NOTE — Telephone Encounter (Signed)
Copied from [place] [id number]. Topic: Quick Communication - See Telephone Encounter >> Nov 06, 2017 11:32 AM Ahmed Prima L wrote: CRM for notification. See Telephone encounter for: 11/06/17.  Patient is requesting a new script for triamcinolone cream (KENALOG) 0.1 %. He said it takes a long time to use and he just ran out. Lat refill was 2016. Please advise.  [id number] [address], [place] [address] [place] [address]

## 2017-11-06 NOTE — Telephone Encounter (Signed)
Noted, Rx sent to pharmacy. 

## 2017-11-11 DIAGNOSIS — H53469 Homonymous bilateral field defects, unspecified side: Secondary | ICD-10-CM | POA: Diagnosis not present

## 2017-11-11 DIAGNOSIS — H25813 Combined forms of age-related cataract, bilateral: Secondary | ICD-10-CM | POA: Diagnosis not present

## 2017-11-11 DIAGNOSIS — H43811 Vitreous degeneration, right eye: Secondary | ICD-10-CM | POA: Diagnosis not present

## 2017-11-11 DIAGNOSIS — H40013 Open angle with borderline findings, low risk, bilateral: Secondary | ICD-10-CM | POA: Diagnosis not present

## 2017-11-11 DIAGNOSIS — Z961 Presence of intraocular lens: Secondary | ICD-10-CM | POA: Diagnosis not present

## 2017-11-13 ENCOUNTER — Encounter: Payer: Self-pay | Admitting: Internal Medicine

## 2017-11-13 ENCOUNTER — Other Ambulatory Visit: Payer: Self-pay

## 2017-11-13 ENCOUNTER — Ambulatory Visit (AMBULATORY_SURGERY_CENTER): Payer: Medicare HMO | Admitting: Internal Medicine

## 2017-11-13 VITALS — BP 138/81 | HR 72 | Temp 99.1°F | Resp 21 | Ht 72.0 in | Wt 264.0 lb

## 2017-11-13 DIAGNOSIS — D122 Benign neoplasm of ascending colon: Secondary | ICD-10-CM | POA: Diagnosis not present

## 2017-11-13 DIAGNOSIS — D123 Benign neoplasm of transverse colon: Secondary | ICD-10-CM | POA: Diagnosis not present

## 2017-11-13 DIAGNOSIS — D124 Benign neoplasm of descending colon: Secondary | ICD-10-CM

## 2017-11-13 DIAGNOSIS — K635 Polyp of colon: Secondary | ICD-10-CM

## 2017-11-13 DIAGNOSIS — D125 Benign neoplasm of sigmoid colon: Secondary | ICD-10-CM

## 2017-11-13 DIAGNOSIS — Z86010 Personal history of colonic polyps: Secondary | ICD-10-CM

## 2017-11-13 DIAGNOSIS — Z1211 Encounter for screening for malignant neoplasm of colon: Secondary | ICD-10-CM | POA: Diagnosis not present

## 2017-11-13 MED ORDER — SODIUM CHLORIDE 0.9 % IV SOLN: 500.0000 mL | Freq: Once | INTRAVENOUS | Status: DC

## 2017-11-13 NOTE — Progress Notes (Signed)
A and O x3. Report to RN. Tolerated MAC anesthesia well.

## 2017-11-13 NOTE — Progress Notes (Signed)
Called to room to assist during endoscopic procedure.  Patient ID and intended procedure confirmed with present staff. Received instructions for my participation in the procedure from the performing physician.  

## 2017-11-13 NOTE — Patient Instructions (Signed)
Please read handouts on Polyps and Diverticulosis. Continue present medications.    YOU HAD AN ENDOSCOPIC PROCEDURE TODAY AT [facility]:   Refer to the procedure report that was given to you for any specific questions about what was found during the examination.  If the procedure report does not answer your questions, please call your gastroenterologist to clarify.  If you requested that your care partner not be given the details of your procedure findings, then the procedure report has been included in a sealed envelope for you to review at your convenience later.  YOU SHOULD EXPECT: Some feelings of bloating in the abdomen. Passage of more gas than usual.  Walking can help get rid of the air that was put into your GI tract during the procedure and reduce the bloating. If you had a lower endoscopy (such as a colonoscopy or flexible sigmoidoscopy) you may notice spotting of blood in your stool or on the toilet paper. If you underwent a bowel prep for your procedure, you may not have a normal bowel movement for a few days.  Please Note:  You might notice some irritation and congestion in your nose or some drainage.  This is from the oxygen used during your procedure.  There is no need for concern and it should clear up in a day or so.  SYMPTOMS TO REPORT IMMEDIATELY:   Following lower endoscopy (colonoscopy or flexible sigmoidoscopy):  Excessive amounts of blood in the stool  Significant tenderness or worsening of abdominal pains  Swelling of the abdomen that is new, acute  Fever of 100F or higher    For urgent or emergent issues, a gastroenterologist can be reached at any hour by calling [phone].   DIET:  We do recommend a small meal at first, but then you may proceed to your regular diet.  Drink plenty of fluids but you should avoid alcoholic beverages for 24 hours.  ACTIVITY:  You should plan to take it easy for the rest of today and you should NOT DRIVE or use  heavy machinery until tomorrow (because of the sedation medicines used during the test).    FOLLOW UP: Our staff will call the number listed on your records the next business day following your procedure to check on you and address any questions or concerns that you may have regarding the information given to you following your procedure. If we do not reach you, we will leave a message.  However, if you are feeling well and you are not experiencing any problems, there is no need to return our call.  We will assume that you have returned to your regular daily activities without incident.  If any biopsies were taken you will be contacted by phone or by letter within the next 1-3 weeks.  Please call us at [phone] if you have not heard about the biopsies in 3 weeks.    SIGNATURES/CONFIDENTIALITY: You and/or your care partner have signed paperwork which will be entered into your electronic medical record.  These signatures attest to the fact that that the information above on your After Visit Summary has been reviewed and is understood.  Full responsibility of the confidentiality of this discharge information lies with you and/or your care-partner.

## 2017-11-13 NOTE — Progress Notes (Signed)
Pt's states no medical or surgical changes since previsit or office visit. 

## 2017-11-13 NOTE — Op Note (Signed)
[facility] Patient Name: Alan [name] Procedure Date: 11/13/2017 11:21 AM MRN: [id number] Endoscopist: Jerene Bears , MD Age: 72 Referring MD:  Date of Birth: [birth year] Gender: Male Account #: [id number] Procedure:                Colonoscopy Indications:              Surveillance: Personal history of adenomatous                            polyps on last colonoscopy 5 years ago Medicines:                Propofol per Anesthesia Procedure:                Pre-Anesthesia Assessment:                           - Prior to the procedure, a History and Physical                            was performed, and patient medications and                            allergies were reviewed. The patient's tolerance of                            previous anesthesia was also reviewed. The risks                            and benefits of the procedure and the sedation                            options and risks were discussed with the patient.                            All questions were answered, and informed consent                            was obtained. Prior Anticoagulants: The patient has                            taken no previous anticoagulant or antiplatelet                            agents. ASA Grade Assessment: II - A patient with                            mild systemic disease. After reviewing the risks                            and benefits, the patient was deemed in                            satisfactory condition to undergo the procedure.  After obtaining informed consent, the colonoscope                            was passed under direct vision. Throughout the                            procedure, the patient's blood pressure, pulse, and                            oxygen saturations were monitored continuously. The                            Colonoscope was introduced through the anus and                            advanced to the cecum,  identified by appendiceal                            orifice and ileocecal valve. The colonoscopy was                            performed without difficulty. The patient tolerated                            the procedure well. The quality of the bowel                            preparation was good. The ileocecal valve,                            appendiceal orifice, and rectum were photographed. Scope In: 11:34:22 AM Scope Out: 11:46:22 AM Scope Withdrawal Time: 0 hours 9 minutes 47 seconds  Total Procedure Duration: 0 hours 12 minutes 0 seconds  Findings:                 The digital rectal exam was normal.                           A 4 mm polyp was found in the ascending colon. The                            polyp was sessile. The polyp was removed with a                            cold snare. Resection and retrieval were complete.                           A 5 mm polyp was found in the transverse colon. The                            polyp was sessile. The polyp was removed with a  cold snare. Resection and retrieval were complete.                           A 5 mm polyp was found in the descending colon. The                            polyp was sessile. The polyp was removed with a                            cold snare. Resection and retrieval were complete.                           Two sessile polyps were found in the sigmoid colon.                            The polyps were 4 to 5 mm in size. These polyps                            were removed with a cold snare. Resection and                            retrieval were complete.                           A few small-mouthed diverticula were found in the                            sigmoid colon and descending colon.                           The retroflexed view of the distal rectum and anal                            verge was normal and showed no anal or rectal                             abnormalities. Complications:            No immediate complications. Estimated Blood Loss:     Estimated blood loss was minimal. Impression:               - One 4 mm polyp in the ascending colon, removed                            with a cold snare. Resected and retrieved.                           - One 5 mm polyp in the transverse colon, removed                            with a cold snare. Resected and retrieved.                           -  One 5 mm polyp in the descending colon, removed                            with a cold snare. Resected and retrieved.                           - Two 4 to 5 mm polyps in the sigmoid colon,                            removed with a cold snare. Resected and retrieved.                           - Diverticulosis in the sigmoid colon and in the                            descending colon.                           - The distal rectum and anal verge are normal on                            retroflexion view. Recommendation:           - Patient has a contact number available for                            emergencies. The signs and symptoms of potential                            delayed complications were discussed with the                            patient. Return to normal activities tomorrow.                            Written discharge instructions were provided to the                            patient.                           - Resume previous diet.                           - Continue present medications.                           - Await pathology results.                           - Repeat colonoscopy is recommended for                            surveillance. The colonoscopy date will be  determined after pathology results from today's                            exam become available for review. Jerene Bears, MD 11/13/2017 11:49:21 AM This report has been signed electronically.

## 2017-11-14 ENCOUNTER — Telehealth: Payer: Self-pay | Admitting: *Deleted

## 2017-11-14 NOTE — Telephone Encounter (Signed)
  Follow up Call-  Call back number 11/13/2017  Post procedure Call Back phone  # [phone]  Permission to leave phone message Yes  Some recent data might be hidden     Patient questions:  Do you have a fever, pain , or abdominal swelling? No. Pain Score  0 *  Have you tolerated food without any problems? Yes.    Have you been able to return to your normal activities? Yes.    Do you have any questions about your discharge instructions: Diet   No. Medications  No. Follow up visit  No.  Do you have questions or concerns about your Care? No.  Actions: * If pain score is 4 or above: No action needed, pain <4.

## 2017-11-19 ENCOUNTER — Encounter: Payer: Self-pay | Admitting: Internal Medicine

## 2017-11-27 ENCOUNTER — Encounter: Payer: Self-pay | Admitting: Podiatry

## 2017-11-27 ENCOUNTER — Ambulatory Visit: Payer: Medicare HMO | Admitting: Podiatry

## 2017-11-27 DIAGNOSIS — M779 Enthesopathy, unspecified: Principal | ICD-10-CM

## 2017-11-27 DIAGNOSIS — M7752 Other enthesopathy of left foot: Secondary | ICD-10-CM

## 2017-11-27 DIAGNOSIS — M778 Other enthesopathies, not elsewhere classified: Secondary | ICD-10-CM

## 2017-11-27 MED ORDER — TRIAMCINOLONE ACETONIDE 10 MG/ML IJ SUSP: 10.0000 mg | Freq: Once | INTRAMUSCULAR | Status: DC

## 2017-11-27 NOTE — Progress Notes (Signed)
Subjective:   [name], male   DOB: 72 y.o.   MRN: [id number]   HPI Patient presents today with a lot of pain in the left ankle with inflammation fluid buildup stated it was great for 5 months   ROS      Objective:  Physical Exam  Neurovascular status intact with inflammation pain around the left sinus tarsi that is painful when pressed with fluid buildup     Assessment:  Sinus tarsitis left     Plan:  Sterile prep and injected the joint 3 mg Kenalog 5 mg Xylocaine advised on reduced activity and reappoint as needed

## 2017-12-13 ENCOUNTER — Other Ambulatory Visit: Payer: Self-pay | Admitting: Primary Care

## 2017-12-13 DIAGNOSIS — J3089 Other allergic rhinitis: Secondary | ICD-10-CM

## 2017-12-22 DIAGNOSIS — R69 Illness, unspecified: Secondary | ICD-10-CM | POA: Diagnosis not present

## 2018-01-16 DIAGNOSIS — H40013 Open angle with borderline findings, low risk, bilateral: Secondary | ICD-10-CM | POA: Diagnosis not present

## 2018-01-16 DIAGNOSIS — H43812 Vitreous degeneration, left eye: Secondary | ICD-10-CM | POA: Diagnosis not present

## 2018-01-16 DIAGNOSIS — Z961 Presence of intraocular lens: Secondary | ICD-10-CM | POA: Diagnosis not present

## 2018-01-16 DIAGNOSIS — H43811 Vitreous degeneration, right eye: Secondary | ICD-10-CM | POA: Diagnosis not present

## 2018-01-16 DIAGNOSIS — H53461 Homonymous bilateral field defects, right side: Secondary | ICD-10-CM | POA: Diagnosis not present

## 2018-01-16 DIAGNOSIS — H25813 Combined forms of age-related cataract, bilateral: Secondary | ICD-10-CM | POA: Diagnosis not present

## 2018-02-19 ENCOUNTER — Ambulatory Visit: Payer: Medicare HMO | Admitting: Podiatry

## 2018-02-19 ENCOUNTER — Encounter: Payer: Self-pay | Admitting: Podiatry

## 2018-02-19 DIAGNOSIS — M778 Other enthesopathies, not elsewhere classified: Secondary | ICD-10-CM

## 2018-02-19 DIAGNOSIS — M779 Enthesopathy, unspecified: Secondary | ICD-10-CM

## 2018-02-19 MED ORDER — TRIAMCINOLONE ACETONIDE 10 MG/ML IJ SUSP
10.0000 mg | Freq: Once | INTRAMUSCULAR | Status: AC
  Administered 2018-02-19: 10 mg

## 2018-02-20 NOTE — Progress Notes (Signed)
Subjective:   [name], male   DOB: 72 y.o.   MRN: [id number]   HPI Patient presents stating he still has discomfort with some of it being in the ankle area and also the top of the left foot which is bothersome.  Patient states that he feels like he limps at times   ROS      Objective:  Physical Exam  Neurovascular status intact with discomfort in the ankle left that is inflamed in the sinus tarsi but also has quite a bit of extensor tendinitis left which appears to be secondary problem     Assessment:  Dorsal tendinitis left along with ankle capsulitis sinus tarsitis     Plan:  H&P and discussed both problems separately and at this point I am get a focus on both areas and I did do a sterile prep of the left dorsal foot and I injected the extensor complex 3 mg Kenalog 5 mg Xylocaine and then injected the sinus tarsi left 3 mg Kenalog 5 Milgram Xylocaine and advised on physical therapy anti-inflammatories and if symptoms persist may need to consider MRI

## 2018-03-18 ENCOUNTER — Other Ambulatory Visit: Payer: Self-pay | Admitting: Primary Care

## 2018-03-18 DIAGNOSIS — E782 Mixed hyperlipidemia: Secondary | ICD-10-CM

## 2018-03-18 DIAGNOSIS — F32A Depression, unspecified: Secondary | ICD-10-CM

## 2018-03-18 DIAGNOSIS — I10 Essential (primary) hypertension: Secondary | ICD-10-CM

## 2018-03-18 DIAGNOSIS — F329 Major depressive disorder, single episode, unspecified: Secondary | ICD-10-CM

## 2018-03-28 ENCOUNTER — Ambulatory Visit: Payer: Self-pay

## 2018-03-28 ENCOUNTER — Encounter: Payer: Self-pay | Admitting: Sports Medicine

## 2018-03-28 ENCOUNTER — Ambulatory Visit: Payer: Medicare HMO | Admitting: Sports Medicine

## 2018-03-28 VITALS — BP 136/80 | HR 78 | Ht 72.0 in | Wt 267.4 lb

## 2018-03-28 DIAGNOSIS — M79645 Pain in left finger(s): Secondary | ICD-10-CM | POA: Diagnosis not present

## 2018-03-28 MED ORDER — DICLOFENAC SODIUM 1 % TD GEL: TRANSDERMAL | 1 refills | Status: DC

## 2018-03-28 NOTE — Procedures (Signed)
PROCEDURE NOTE:  Ultrasound Guided: Injection: Left hand, Second MCP, Intra-articular Images were obtained and interpreted by myself, Teresa Coombs, DO  Images have been saved and stored to PACS system. Images obtained on: GE S7 Ultrasound machine    ULTRASOUND FINDINGS:  Moderate degree of synovitis.  Small osteophytic spurs.  Slightly narrowed joint space. DESCRIPTION OF PROCEDURE:  The patient's clinical condition is marked by substantial pain and/or significant functional disability. Other conservative therapy has not provided relief, is contraindicated, or not appropriate. There is a reasonable likelihood that injection will significantly improve the patient's pain and/or functional impairment.   After discussing the risks, benefits and expected outcomes of the injection and all questions were reviewed and answered, the patient wished to undergo the above named procedure.  Verbal consent was obtained.  The ultrasound was used to identify the target structure and adjacent neurovascular structures. The skin was then prepped in sterile fashion and the target structure was injected under direct visualization using sterile technique as below:  Single injection performed as below: PREP: Alcohol and Ethel Chloride APPROACH:direct, single injection, 25g 1.5 in. INJECTATE: 0.5 cc 0.5% Marcaine and 0.5 cc 40mg /mL DepoMedrol ASPIRATE: None DRESSING: Band-Aid  Post procedural instructions including recommending icing and warning signs for infection were reviewed.    This procedure was well tolerated and there were no complications.   IMPRESSION: Succesful Ultrasound Guided: Injection

## 2018-03-28 NOTE — Progress Notes (Signed)
Alan [name]. Alan [name], Alan [name] at [place] [facility] - 72 y.o. male MRN [id number]  Date of birth: 1945-12-23  Visit Date: 03/28/2018   PCP: Pleas Koch, NP   Referred by: Pleas Koch, NP   SUBJECTIVE:  Chief Complaint  Patient presents with  . Initial Assessment  . Pain L index finger    HPI: Patient presents with several years of worsening on and off pain of the left index finger MCP.  This is progressively worsened over the past 2 weeks and described as a moderate tight bee sting.  He has no significant warmth or erythema but does have some swelling in the joint does turn red from time to time mildly.  Pain is nonradiating.  He is unable to fully close his fist and this is what is most painful for him does help if he rests.  He has been using BenGay with only minimal improvement.  REVIEW OF SYSTEMS: Reports, Not associated night time disturbances. Denies fevers, chills, or night sweats. Denies unexplained weight loss. Reports, Prior skin cancer personal history of cancer. Denies changes in bowel or bladder habits. Denies recent unreported falls. Denies new or worsening dyspnea or wheezing. Denies headaches or dizziness.  Denies numbness, tingling or weakness  In the extremities.  Denies dizziness or presyncopal episodes Denies lower extremity edema   HISTORY:  Prior history reviewed and updated per electronic medical record.  Social History   Occupational History  . 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 Smoking status: Former Smoker    Packs/day: 3.00    Years: 25.00    Pack years: 75.00    Last attempt to quit: 11/23/1996    Years since quitting: 21.4  . Smokeless tobacco: Never Used  Substance and Sexual Activity  . Alcohol use: Yes    Alcohol/week: 4.0 standard drinks    Types: 4 Standard drinks or equivalent per week    Comment: 4 mixed drinks per week  . Drug  use: No  . Sexual activity: Yes    Partners: Female    Comment: monogamous relationship   Social History   Social History Narrative   Regular exercise -- no              Past Medical History:  Diagnosis Date  . Allergic rhinitis, cause unspecified   . Allergy   . Cancer (HCC)    Basal cell skin cancer,left ear  . Carpal tunnel syndrome   . Chronic airway obstruction, not elsewhere classified   . Esophageal reflux   . Glaucoma (increased eye pressure)   . Lipoma of unspecified site   . Osteoarthrosis, unspecified whether generalized or localized, lower leg   . Other and unspecified hyperlipidemia   . Personal history of colonic polyps   . Personal history of other malignant neoplasm of skin   . Unspecified essential hypertension      Past Surgical History:  Procedure Laterality Date  . bone spur  06-2003   right thumb  . CATARACT EXTRACTION    . KNEE ARTHROSCOPY  09-2007   partial medial mesicecotmy, patellar chonfroplasty  . SHOULDER SURGERY  03-24-2007   removal bone spur  . SHOULDER SURGERY Left 07/2016   [place], Dr. Harmon Pier  . TOTAL KNEE ARTHROPLASTY     03-2009 [facility]  . TOTAL KNEE ARTHROPLASTY Left 12/2012   [facility]    family history includes Colon polyps in  his father; Diabetes in his mother; Drug abuse in his daughter; Heart attack in his mother; Hyperlipidemia in his brother, brother, father, mother, sister, and sister; Hypertension in his brother, brother, father, mother, and sister; Lung cancer in his father and sister; Obesity in his mother. There is no history of Colon cancer, Esophageal cancer, Pancreatic cancer, or Rectal cancer.  DATA OBTAINED & REVIEWED:  Recent Labs    06/27/17 0811 10/18/17 1058  HGBA1C  --  5.7  CALCIUM  --  9.6  AST 25  --   ALT 35  --    No problems updated. No specialty comments available.  OBJECTIVE:  VS:  HT:6' (182.9 cm)   WT:267 lb 6.4 oz (121.3 kg)  BMI:36.26    BP:136/80  HR:78bpm   TEMP: ( )  RESP:95 %   PHYSICAL EXAM: CONSTITUTIONAL: Well-developed, Well-nourished and In no acute distress PSYCHIATRIC : Alert & appropriately interactive. and Not depressed or anxious appearing. RESPIRATORY : No increased work of breathing and Trachea Midline EYES : Pupils are equal., EOM intact without nystagmus. and No scleral icterus.  VASCULAR EXAM : Warm and well perfused NEURO: unremarkable  MSK Exam:  Left hand  No overlying skin changes TTP over: Second MCP Mild swelling Mild effusion Postsurgical changes of the left hand from prior bilateral thumb CMC suspension surgery.  He has no focal pain directly over the first CMC joints.  He has difficult time fully closing his left hand due to swelling and pain at the index finger MCP.  He has pain with ulnar and radial deviation of his index finger MCP joint.  He has full flexion and extension of the IP joints.  Mild pain with axial load of the index finger MCP joint.    ASSESSMENT  1. Pain in finger of left hand     PLAN:  Pertinent additional documentation may be included in corresponding procedure notes, imaging studies, problem based documentation and patient instructions.  Procedures:  US Guided Injection per procedure note  Medications:  Meds ordered this encounter  Medications  . DISCONTD: diclofenac sodium (VOLTAREN) 1 % GEL    Sig: Apply topically to affected area qid    Dispense:  100 g    Refill:  1    Discussion/Instructions: No problem-specific Assessment & Plan notes found for this encounter.  Discussed appropriate use of both heat and ice with the patient today.  Discussed red flag symptoms that warrant earlier emergent evaluation and patient voices understanding. Should do well with this injection.  He had a moderate degree of synovitis however the amount of degenerative spurring was mild.  She should see good improvements with the injection but we also gave him a prescription for Voltaren gel to be  used as needed. If any lack of improvement: consider further diagnostic evaluation with Plain film x-rays  At follow up will plan : to consider repeat corticosteroid injections Return if symptoms worsen or fail to improve.          Gerda Diss, [facility] Physician

## 2018-03-28 NOTE — Patient Instructions (Addendum)

## 2018-04-08 ENCOUNTER — Encounter: Payer: Self-pay | Admitting: Primary Care

## 2018-04-08 ENCOUNTER — Ambulatory Visit (INDEPENDENT_AMBULATORY_CARE_PROVIDER_SITE_OTHER): Payer: Medicare HMO | Admitting: Primary Care

## 2018-04-08 DIAGNOSIS — R69 Illness, unspecified: Secondary | ICD-10-CM | POA: Diagnosis not present

## 2018-04-08 DIAGNOSIS — F1010 Alcohol abuse, uncomplicated: Secondary | ICD-10-CM | POA: Diagnosis not present

## 2018-04-08 DIAGNOSIS — F1011 Alcohol abuse, in remission: Secondary | ICD-10-CM | POA: Insufficient documentation

## 2018-04-08 NOTE — Progress Notes (Signed)
Subjective:    [name], male    DOB: 27-Jun-1945, 73 y.o.   MRN: [id number]  HPI  Alan [name] is a 73 year old male who presents today to discuss alcohol abuse.   He's been drinking alcohol intermittently since his early adult years. Previously drank beer, switched to Vodka at some point. He drinks Vodka (4-5 mixed drinks) daily and has been doing this for the last 5-6 years.  His girlfriend has brought up to him numerous times that he needs to quit, but he hasn't been ready. He has a short temper which is worse when drinking. He sleeps a lot as he doesn't sleep well in general. He also has a bilateral resting tremor for which he's noticed has increased. Given these reasons he is ready to quit.  His last drink of alcohol was last night at 6 pm. He mostly drinks during the day. He denies abdominal pain, nausea, vomiting. He did contact someone from "[facility]" but can't remember what they said to do.  Review of Systems  Constitutional: Negative for diaphoresis and fatigue.  Respiratory: Negative for shortness of breath.   Cardiovascular: Negative for chest pain and palpitations.  Neurological: Positive for tremors.       Past Medical History:  Diagnosis Date  . Allergic rhinitis, cause unspecified   . Allergy   . Cancer (HCC)    Basal cell skin cancer,left ear  . Carpal tunnel syndrome   . Chronic airway obstruction, not elsewhere classified   . Esophageal reflux   . Glaucoma (increased eye pressure)   . Lipoma of unspecified site   . Osteoarthrosis, unspecified whether generalized or localized, lower leg   . Other and unspecified hyperlipidemia   . Personal history of colonic polyps   . Personal history of other malignant neoplasm of skin   . Unspecified essential hypertension      Social History   Socioeconomic History  . Marital status: Divorced    Spouse name: Not on file  . Number of children: 2  . Years of education: Not on file  . Highest  education level: Not on file  Occupational History  . 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 Financial resource strain: Not on file  . Food insecurity:    Worry: Not on file    Inability: Not on file  . Transportation needs:    Medical: Not on file    Non-medical: Not on file  Tobacco Use  . Smoking status: Former Smoker    Packs/day: 3.00    Years: 25.00    Pack years: 75.00    Last attempt to quit: 11/23/1996    Years since quitting: 21.3  . Smokeless tobacco: Never Used  Substance and Sexual Activity  . Alcohol use: Yes    Alcohol/week: 4.0 standard drinks    Types: 4 Standard drinks or equivalent per week    Comment: 4 mixed drinks per week  . Drug use: No  . Sexual activity: Not Currently  Lifestyle  . Physical activity:    Days per week: Not on file    Minutes per session: Not on file  . Stress: Not on file  Relationships  . Social connections:    Talks on phone: Not on file    Gets together: Not on file    Attends religious service: Not on file    Active member of club or organization: Not on file    Attends meetings of clubs  or organizations: Not on file    Relationship status: Not on file  . Intimate partner violence:    Fear of current or ex partner: Not on file    Emotionally abused: Not on file    Physically abused: Not on file    Forced sexual activity: Not on file  Other Topics Concern  . Not on file  Social History Narrative   Regular exercise -- no             Past Surgical History:  Procedure Laterality Date  . bone spur  06-2003   right thumb  . CATARACT EXTRACTION    . KNEE ARTHROSCOPY  09-2007   partial medial mesicecotmy, patellar chonfroplasty  . SHOULDER SURGERY  03-24-2007   removal bone spur  . SHOULDER SURGERY Left 07/2016   [place], Dr. Harmon Pier  . TOTAL KNEE ARTHROPLASTY     03-2009 [facility]  . TOTAL KNEE ARTHROPLASTY Left 12/2012   [facility]    Family History  Problem Relation Age of  Onset  . Diabetes Mother   . Hyperlipidemia Mother   . Hypertension Mother   . Heart attack Mother   . Obesity Mother   . Hyperlipidemia Father   . Hypertension Father   . Lung cancer Father   . Colon polyps Father   . Hyperlipidemia Brother   . Hypertension Brother   . Hyperlipidemia Brother   . Hypertension Brother   . Hypertension Sister   . Hyperlipidemia Sister   . Lung cancer Sister   . Hyperlipidemia Sister   . Drug abuse Daughter   . Colon cancer Neg Hx   . Esophageal cancer Neg Hx   . Pancreatic cancer Neg Hx   . Rectal cancer Neg Hx     No Known Allergies  Current Outpatient Medications on File Prior to Visit  Medication Sig Dispense Refill  . albuterol (PROVENTIL HFA;VENTOLIN HFA) 108 (90 Base) MCG/ACT inhaler Inhale 2 puffs into the lungs every 4 (four) hours as needed for wheezing or shortness of breath (cough, shortness of breath or wheezing.). 1 Inhaler 1  . amLODipine (NORVASC) 10 MG tablet TAKE 1 TABLET BY MOUTH EVERY DAY 90 tablet 1  . aspirin 81 MG tablet Take 81 mg by mouth daily.      Marland Kitchen atorvastatin (LIPITOR) 80 MG tablet TAKE 1 TABLET BY MOUTH EVERY DAY 90 tablet 1  . budesonide-formoterol (SYMBICORT) 160-4.5 MCG/ACT inhaler Inhale 2 puffs into the lungs 2 (two) times daily. 1 Inhaler 3  . cetirizine (ZYRTEC) 10 MG tablet TAKE 1 TABLET BY MOUTH DAILY AS NEEDED FOR ALLERGIES 90 tablet 0  . diclofenac sodium (VOLTAREN) 1 % GEL Apply topically to affected area qid 100 g 1  . fish oil-omega-3 fatty acids 1000 MG capsule Take 2 g by mouth daily.      Marland Kitchen losartan (COZAAR) 100 MG tablet TAKE 1 TABLET BY MOUTH ONCE DAILY FOR BLOOD PRESSURE 90 tablet 1  . montelukast (SINGULAIR) 10 MG tablet Take 1 tablet (10 mg total) by mouth at bedtime. 90 tablet 3  . Multiple Vitamin (MULTIVITAMIN) tablet Take 1 tablet by mouth daily.      Marland Kitchen omeprazole (PRILOSEC) 20 MG capsule Take 20 mg by mouth daily.      . sertraline (ZOLOFT) 50 MG tablet TAKE 1 TABLET BY MOUTH EVERY DAY  90 tablet 1  . tiZANidine (ZANAFLEX) 4 MG tablet Take 1 tablet (4 mg total) by mouth every 8 (eight) hours as needed for  muscle spasms. 30 tablet 0  . traMADol (ULTRAM) 50 MG tablet tramadol 50 mg tablet  TAKE 1 TABLET BY MOUTH THREE TIMES A DAY    . triamcinolone cream (KENALOG) 0.1 % Apply 1 application topically 2 (two) times daily. 30 g 0   Current Facility-Administered Medications on File Prior to Visit  Medication Dose Route Frequency Provider Last Rate Last Dose  . triamcinolone acetonide (KENALOG) 10 MG/ML injection 10 mg  10 mg Other Once Regal, Norman S, DPM        BP 126/82   Pulse 88   Temp 98 F (36.7 C) (Oral)   Ht 6' (1.829 m)   Wt 264 lb 8 oz (120 kg)   SpO2 95%   BMI 35.87 kg/m    Objective:   Physical Exam  Constitutional: He appears well-nourished.  Neck: Neck supple.  Cardiovascular: Normal rate.  Respiratory: Effort normal.  Neurological:  Resting tremor to bilateral hands  Skin: Skin is warm and dry.  Psychiatric: He has a normal mood and affect.           Assessment & Plan:

## 2018-04-08 NOTE — Assessment & Plan Note (Signed)
Chronic for years, ready to quit now.  He appears well and is stable for outpatient treatment. Information provided to patient for outpatient treatment through [facility]. He will contact.

## 2018-04-08 NOTE — Patient Instructions (Signed)
Intensive [facility] for alcohol abuse treatment.  Please contact me if you have any problems getting connected.  It was a pleasure to see you today!

## 2018-04-15 ENCOUNTER — Telehealth (HOSPITAL_COMMUNITY): Payer: Self-pay | Admitting: Psychology

## 2018-04-15 ENCOUNTER — Telehealth (HOSPITAL_COMMUNITY): Payer: Self-pay | Admitting: Licensed Clinical Social Worker

## 2018-04-15 NOTE — Telephone Encounter (Signed)
Called PT to schedule a CCA for intake into CD-IOP. Pt agreed to arrive for paperwork and an appointment w/ [name], LCAS, on Friday 1/17 at 8:30AM. total time spent w/ PT 69min.

## 2018-04-17 ENCOUNTER — Other Ambulatory Visit: Payer: Self-pay | Admitting: Primary Care

## 2018-04-17 DIAGNOSIS — J3089 Other allergic rhinitis: Secondary | ICD-10-CM

## 2018-04-18 ENCOUNTER — Ambulatory Visit (INDEPENDENT_AMBULATORY_CARE_PROVIDER_SITE_OTHER): Payer: Medicare HMO | Admitting: Psychology

## 2018-04-18 ENCOUNTER — Telehealth (HOSPITAL_COMMUNITY): Payer: Self-pay | Admitting: Psychology

## 2018-04-18 ENCOUNTER — Encounter (HOSPITAL_COMMUNITY): Payer: Self-pay | Admitting: Psychology

## 2018-04-18 DIAGNOSIS — R69 Illness, unspecified: Secondary | ICD-10-CM | POA: Diagnosis not present

## 2018-04-18 DIAGNOSIS — F1020 Alcohol dependence, uncomplicated: Secondary | ICD-10-CM | POA: Diagnosis not present
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
## 2018-04-23 ENCOUNTER — Other Ambulatory Visit (HOSPITAL_COMMUNITY): Payer: Medicare HMO | Attending: Psychiatry | Admitting: Psychology

## 2018-04-23 DIAGNOSIS — Z85828 Personal history of other malignant neoplasm of skin: Secondary | ICD-10-CM | POA: Insufficient documentation

## 2018-04-23 DIAGNOSIS — Z8349 Family history of other endocrine, nutritional and metabolic diseases: Secondary | ICD-10-CM | POA: Insufficient documentation

## 2018-04-23 DIAGNOSIS — Z6372 Alcoholism and drug addiction in family: Secondary | ICD-10-CM | POA: Insufficient documentation

## 2018-04-23 DIAGNOSIS — Z9189 Other specified personal risk factors, not elsewhere classified: Secondary | ICD-10-CM | POA: Insufficient documentation

## 2018-04-23 DIAGNOSIS — Z8249 Family history of ischemic heart disease and other diseases of the circulatory system: Secondary | ICD-10-CM | POA: Insufficient documentation

## 2018-04-23 DIAGNOSIS — H409 Unspecified glaucoma: Secondary | ICD-10-CM | POA: Insufficient documentation

## 2018-04-23 DIAGNOSIS — F1994 Other psychoactive substance use, unspecified with psychoactive substance-induced mood disorder: Secondary | ICD-10-CM | POA: Insufficient documentation

## 2018-04-23 DIAGNOSIS — Z833 Family history of diabetes mellitus: Secondary | ICD-10-CM | POA: Insufficient documentation

## 2018-04-23 DIAGNOSIS — F1020 Alcohol dependence, uncomplicated: Secondary | ICD-10-CM

## 2018-04-23 DIAGNOSIS — Z801 Family history of malignant neoplasm of trachea, bronchus and lung: Secondary | ICD-10-CM | POA: Insufficient documentation

## 2018-04-23 DIAGNOSIS — Z79899 Other long term (current) drug therapy: Secondary | ICD-10-CM | POA: Insufficient documentation

## 2018-04-23 DIAGNOSIS — I10 Essential (primary) hypertension: Secondary | ICD-10-CM | POA: Insufficient documentation

## 2018-04-23 DIAGNOSIS — F341 Dysthymic disorder: Secondary | ICD-10-CM | POA: Insufficient documentation

## 2018-04-23 DIAGNOSIS — F329 Major depressive disorder, single episode, unspecified: Secondary | ICD-10-CM | POA: Insufficient documentation

## 2018-04-23 DIAGNOSIS — R7303 Prediabetes: Secondary | ICD-10-CM | POA: Insufficient documentation

## 2018-04-23 DIAGNOSIS — Z7982 Long term (current) use of aspirin: Secondary | ICD-10-CM | POA: Insufficient documentation

## 2018-04-23 DIAGNOSIS — E785 Hyperlipidemia, unspecified: Secondary | ICD-10-CM | POA: Insufficient documentation

## 2018-04-23 DIAGNOSIS — Z96652 Presence of left artificial knee joint: Secondary | ICD-10-CM | POA: Insufficient documentation

## 2018-04-23 DIAGNOSIS — Z87891 Personal history of nicotine dependence: Secondary | ICD-10-CM | POA: Insufficient documentation

## 2018-04-23 DIAGNOSIS — J3089 Other allergic rhinitis: Secondary | ICD-10-CM | POA: Insufficient documentation

## 2018-04-23 DIAGNOSIS — M19079 Primary osteoarthritis, unspecified ankle and foot: Secondary | ICD-10-CM | POA: Insufficient documentation

## 2018-04-23 DIAGNOSIS — J449 Chronic obstructive pulmonary disease, unspecified: Secondary | ICD-10-CM | POA: Insufficient documentation

## 2018-04-23 DIAGNOSIS — R638 Other symptoms and signs concerning food and fluid intake: Secondary | ICD-10-CM | POA: Insufficient documentation

## 2018-04-23 DIAGNOSIS — Z62810 Personal history of physical and sexual abuse in childhood: Secondary | ICD-10-CM | POA: Insufficient documentation

## 2018-04-24 ENCOUNTER — Encounter (HOSPITAL_COMMUNITY): Payer: Self-pay

## 2018-04-24 ENCOUNTER — Other Ambulatory Visit (HOSPITAL_COMMUNITY): Payer: Medicare HMO | Admitting: Psychology

## 2018-04-24 DIAGNOSIS — F1994 Other psychoactive substance use, unspecified with psychoactive substance-induced mood disorder: Secondary | ICD-10-CM | POA: Diagnosis not present

## 2018-04-24 DIAGNOSIS — F341 Dysthymic disorder: Secondary | ICD-10-CM | POA: Diagnosis not present

## 2018-04-24 DIAGNOSIS — F1020 Alcohol dependence, uncomplicated: Secondary | ICD-10-CM | POA: Diagnosis not present

## 2018-04-24 DIAGNOSIS — H409 Unspecified glaucoma: Secondary | ICD-10-CM | POA: Diagnosis not present

## 2018-04-24 DIAGNOSIS — Z62810 Personal history of physical and sexual abuse in childhood: Secondary | ICD-10-CM | POA: Diagnosis not present

## 2018-04-24 DIAGNOSIS — R69 Illness, unspecified: Secondary | ICD-10-CM | POA: Diagnosis not present

## 2018-04-24 DIAGNOSIS — Z8349 Family history of other endocrine, nutritional and metabolic diseases: Secondary | ICD-10-CM | POA: Diagnosis not present

## 2018-04-24 DIAGNOSIS — Z87891 Personal history of nicotine dependence: Secondary | ICD-10-CM | POA: Diagnosis not present

## 2018-04-24 DIAGNOSIS — Z9189 Other specified personal risk factors, not elsewhere classified: Secondary | ICD-10-CM | POA: Diagnosis not present

## 2018-04-24 DIAGNOSIS — Z96652 Presence of left artificial knee joint: Secondary | ICD-10-CM | POA: Diagnosis not present

## 2018-04-24 DIAGNOSIS — Z79899 Other long term (current) drug therapy: Secondary | ICD-10-CM | POA: Diagnosis not present

## 2018-04-24 DIAGNOSIS — R7303 Prediabetes: Secondary | ICD-10-CM | POA: Diagnosis not present

## 2018-04-24 DIAGNOSIS — F329 Major depressive disorder, single episode, unspecified: Secondary | ICD-10-CM | POA: Diagnosis not present

## 2018-04-24 DIAGNOSIS — Z6372 Alcoholism and drug addiction in family: Secondary | ICD-10-CM | POA: Diagnosis not present

## 2018-04-24 DIAGNOSIS — Z8249 Family history of ischemic heart disease and other diseases of the circulatory system: Secondary | ICD-10-CM | POA: Diagnosis not present

## 2018-04-24 DIAGNOSIS — Z801 Family history of malignant neoplasm of trachea, bronchus and lung: Secondary | ICD-10-CM | POA: Diagnosis not present

## 2018-04-24 DIAGNOSIS — R638 Other symptoms and signs concerning food and fluid intake: Secondary | ICD-10-CM | POA: Diagnosis not present

## 2018-04-24 DIAGNOSIS — I10 Essential (primary) hypertension: Secondary | ICD-10-CM | POA: Diagnosis not present

## 2018-04-24 DIAGNOSIS — E785 Hyperlipidemia, unspecified: Secondary | ICD-10-CM | POA: Diagnosis not present

## 2018-04-24 DIAGNOSIS — Z7982 Long term (current) use of aspirin: Secondary | ICD-10-CM | POA: Diagnosis not present

## 2018-04-24 DIAGNOSIS — J449 Chronic obstructive pulmonary disease, unspecified: Secondary | ICD-10-CM | POA: Diagnosis not present

## 2018-04-24 DIAGNOSIS — J3089 Other allergic rhinitis: Secondary | ICD-10-CM | POA: Diagnosis not present

## 2018-04-24 DIAGNOSIS — M19079 Primary osteoarthritis, unspecified ankle and foot: Secondary | ICD-10-CM | POA: Diagnosis not present

## 2018-04-24 DIAGNOSIS — Z833 Family history of diabetes mellitus: Secondary | ICD-10-CM | POA: Diagnosis not present

## 2018-04-24 DIAGNOSIS — Z85828 Personal history of other malignant neoplasm of skin: Secondary | ICD-10-CM | POA: Diagnosis not present

## 2018-04-24 NOTE — Progress Notes (Signed)
    Daily Group Progress Note  Program: CD-IOP   04/24/2018 Alan [name] [id number]  Diagnosis:  Alcohol use disorder, severe, dependence ([place])  Stage of Change: Contemplation, Planning  Sobriety Date: 1/22  Group Time: 1-2:30  Participation Level: Minimal  Behavioral Response: Appropriate and Sharing  Type of Therapy: Process Group  Interventions: CBT  Topic: PTs were active and engaged in group processing session. Counselor facilitated CBT-based and emotion-focused processing questions to help PTs discuss their recovery from mind-altering drugs/alcohol. Emphasis was placed on PTs disclosing their challenges and successes pertaining to their treatment plan goals.  One PT had to leave early due to child sickness.      Group Time: 2:30-4  Participation Level: Minimal  Behavioral Response: Appropriate and Sharing  Type of Therapy: Psycho-education Group  Interventions: CBT and Psychosocial Skills: ADL's  Topic: Patient were active and engaged in psychoeducation session led by Frederich Balding, health educator. She led a didactic presentation on sleep, stress, movement, and diet.     Summary: PT was mostly stoic, attentive, and made good eye contact w/ group members. His affect was blunted. He admitted he was ashamed to tell his girlfriend and his friend about his alcohol problem. PT presents as anxious and states he is interested in medications for cravings and anxiety.    UDS collected: No Results: Pending  AA/NA attended?: No  Sponsor?: No   Wes Swan, LPCA LCASA 04/24/2018 3:00 PM

## 2018-04-25 ENCOUNTER — Encounter (HOSPITAL_COMMUNITY): Payer: Self-pay | Admitting: Psychology

## 2018-04-25 NOTE — Progress Notes (Signed)
    Daily Group Progress Note  Program: CD-IOP   04/25/2018 Alan [name] [id number]  Diagnosis:  Alcohol use disorder, severe, dependence ([place])   Sobriety Date: 04-23-18  Group Time: 1-2:30pm  Participation Level: Active  Behavioral Response: Appropriate  Type of Therapy: Process Group  Interventions: Supportive  Topic: Process: The first part of group began with process. Group members shared their experiences in early recovery since we met last Wednesday. One group member was absent. Two new group members were present and shared about themselves with the group. Drug tests were collected from all present group members. The Medical Director met with two group members to complete one medication check and one initial evaluation.      Group Time: 2:30-4pm  Participation Level: Minimal  Behavioral Response: Appropriate  Type of Therapy: Psycho-education Group  Interventions: Other: Medication Managment  Topic: Psycho-ed: Medication Presentation by Pharmacist; The second half of group was spent in psycho-ed discussing different medications typically used to treat substance use disorders. The Pharmacist lead the group in a discussion about their medications and answered questions the group members had about medication. Group members were active and engaged during the Pharmacist's presentation and found the presentation to be extremely informative.     Summary: This was the patient's first group counseling session. A drug test was collected from the patient. The patient shared with the group about what brought him to CD IOP. The patient shared that his drinking was causing him to "miss out on life." The patient shared that his drinking has caused him to isolate from the people he loves and be emotionally and physically unavailable. The patient shared that his parents were both alcoholics but stopped during his childhood. He shared about the chaos he experienced while both of  his parents drank. The patient has an older brother whom he is not very close with and a sister whom he is very close too. The patient shared that his sister is sick with pneumonia ad has lots of issues with her lungs. He is scared that she may pass soon. The patient's younger brother has recently passed away. The patient shared that he lives alone but his biggest supporter is his girlfriend of 19 years. The patient shared that she supports him going to treatment and getting help.  He wants to be a better partner for her because she has stuck by him. The patient shared that his girlfriend likes to go dancing but his drinking has caused him to be passed out at times when she wanted to go. The patient shared that he was drinking a pint of vodka daily but has done his best to hide the severity of his drinking from people in his life. The patient is very active in the church and talks about them as his second "family." During the Pharmacist's presentation, the patient was actively listening and absorbing the information shared with him.     UDS collected: Yes   AA/NA attended?: No  Sponsor?: No   [name], LCAS 04/25/2018 1:31 PM

## 2018-04-28 ENCOUNTER — Encounter (HOSPITAL_COMMUNITY): Payer: Self-pay | Admitting: Medical

## 2018-04-28 ENCOUNTER — Other Ambulatory Visit (INDEPENDENT_AMBULATORY_CARE_PROVIDER_SITE_OTHER): Payer: Medicare HMO | Admitting: Psychology

## 2018-04-28 DIAGNOSIS — R638 Other symptoms and signs concerning food and fluid intake: Secondary | ICD-10-CM

## 2018-04-28 DIAGNOSIS — T7412XS Child physical abuse, confirmed, sequela: Secondary | ICD-10-CM

## 2018-04-28 DIAGNOSIS — H409 Unspecified glaucoma: Secondary | ICD-10-CM | POA: Insufficient documentation

## 2018-04-28 DIAGNOSIS — R69 Illness, unspecified: Secondary | ICD-10-CM | POA: Diagnosis not present

## 2018-04-28 DIAGNOSIS — Z9189 Other specified personal risk factors, not elsewhere classified: Secondary | ICD-10-CM

## 2018-04-28 DIAGNOSIS — Z6372 Alcoholism and drug addiction in family: Secondary | ICD-10-CM | POA: Diagnosis not present

## 2018-04-28 DIAGNOSIS — F341 Dysthymic disorder: Secondary | ICD-10-CM

## 2018-04-28 DIAGNOSIS — F1020 Alcohol dependence, uncomplicated: Secondary | ICD-10-CM | POA: Diagnosis not present

## 2018-04-28 DIAGNOSIS — Z811 Family history of alcohol abuse and dependence: Secondary | ICD-10-CM

## 2018-04-28 DIAGNOSIS — J449 Chronic obstructive pulmonary disease, unspecified: Secondary | ICD-10-CM

## 2018-04-28 DIAGNOSIS — Z62810 Personal history of physical and sexual abuse in childhood: Secondary | ICD-10-CM | POA: Diagnosis not present

## 2018-04-28 DIAGNOSIS — F1994 Other psychoactive substance use, unspecified with psychoactive substance-induced mood disorder: Secondary | ICD-10-CM

## 2018-04-28 DIAGNOSIS — J3089 Other allergic rhinitis: Secondary | ICD-10-CM

## 2018-04-28 DIAGNOSIS — R7303 Prediabetes: Secondary | ICD-10-CM | POA: Diagnosis not present

## 2018-04-28 MED ORDER — BACLOFEN 10 MG PO TABS: 10.0000 mg | ORAL_TABLET | Freq: Three times a day (TID) | ORAL | 1 refills | Status: DC
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
## 2018-04-29 ENCOUNTER — Telehealth: Payer: Self-pay

## 2018-04-29 ENCOUNTER — Encounter (HOSPITAL_COMMUNITY): Payer: Self-pay | Admitting: Psychology

## 2018-04-29 NOTE — Telephone Encounter (Signed)
Spoke with pt, he would like to hold off on rx for now.

## 2018-04-29 NOTE — Telephone Encounter (Signed)
Per [facility], can fill at [facility] w/ discount card for around $13.25.

## 2018-04-29 NOTE — Progress Notes (Signed)
Daily Group Progress Note  Program: CD-IOP   04/29/2018 Alan [name] [id number]  Diagnosis:  Alcohol use disorder, severe, dependence ([place])  Family history of alcoholism in father  Family history of alcoholism in mother  Non-seasonal allergic rhinitis, unspecified trigger  Chronic obstructive pulmonary disease, unspecified COPD type ([place])  Prediabetes  Recurrent major depressive disorder, in full remission ([place])  Glaucoma, unspecified glaucoma type, unspecified laterality  Substance induced mood disorder ([place])  Abnormal craving   Sobriety Date: 04/23/18  Group Time: 1-2:30pm  Participation Level: Active  Behavioral Response: Sharing  Type of Therapy: Process Group  Interventions: Supportive  Topic: Process: The first half of group was spent in process. Members shared about the past weekend and identified any 'shining moments' or 'speed bumps' in their recovery. The disclosures included step work and attending many meetings. No one 'slipped' over the weekend and no one reported experienced cravings. The medical director met with three group members during group today. Two drug tests were collected and one member was absent.   Group Time: 2:30-4pm  Participation Level: Active  Behavioral Response: Sharing  Type of Therapy: Psycho-education Group  Interventions: Family Systems  Topic: Psycho-Education: Poem "I am from" and Family Roles in Dysfunctional Families. The second half of group was spent in a psycho-ed. Members were provided a partially completed poem and they were asked to "fill-in the blanks". The poem addressed products, smells, family rituals and traditions in their childhoods. Not everyone read them, but they all had painful aspects in their lives. Upon completion, members received another handout identifying the five most common family roles identified in dysfunctional families. They took turns reading from each description and discussing if  they could identify this role in their families. Some appeared visibly upset and were not willing to share.   Summary: The patient reported he had attended two [place] meetings since we last met - both at 12:10 at the [employer]. Another group member noted that she attends the 8 am meeting at the same place and there are lots of people with many years of sobriety. She encouraged him to try it sometime. His weekend was uneventful. His S/O was at the beach, but he still took her brother out for breakfast and then to the grocery store. They usually do this together every Saturday morning. He went to church on Sunday. When asked about any cravings, the patiient shared that he has "thoughts of drinking, but I am afraid of that first one, because I know there will be many to follow".  In the psycho-ed, when asked what the poem had brought up for him, the patient admitted that it "made me think how old I am"! He reported that both of his parents were gone and his daughter had also died. However, the patient recounted good times at [facility] in the [place] part of the state. He reporter they would go down there almost every other weekend and they were clearly fond and joyful memories. He did not want to share his poem with the group. During the discussion on the handout on Family Roles, the patient met for the first time with the medical director. He had previously mentioned wanting something for cravings. The patient returned as the session was concluding. The patient's parents were both alcoholics and his only child, his daughter died from a drug overdose. There is a lot of pain and loss within him and part of our work will be to help him begin to let  that out. He responded well to this intervention.    UDS collected: Yes Results: pending  AA/NA attended?: [place] and Friday  Sponsor?: No, but he is very new to recovery and AA   [name], LCAS 04/29/2018 9:26 AM

## 2018-04-29 NOTE — Telephone Encounter (Signed)
Alan [name] (Key: [id number])   This request has received a Unfavorable outcome. Please note any additional information provided by [facility] at the bottom of this request.

## 2018-04-29 NOTE — Telephone Encounter (Signed)
Voltaren Gel 1% 100 g  CoverMyMeds key - Key: [id number]  PA initiated via [email or web address]

## 2018-04-29 NOTE — Telephone Encounter (Signed)
Alan [name] (Key: [id number])   Your information has been sent to [facility] Part [facility].

## 2018-04-29 NOTE — Addendum Note (Signed)
Addended by: Jasper Loser on: 04/29/2018 02:10 PM   Modules accepted: Orders

## 2018-04-30 ENCOUNTER — Other Ambulatory Visit (HOSPITAL_COMMUNITY): Payer: Medicare HMO | Admitting: Psychology

## 2018-04-30 DIAGNOSIS — F1020 Alcohol dependence, uncomplicated: Secondary | ICD-10-CM

## 2018-05-01 ENCOUNTER — Other Ambulatory Visit (HOSPITAL_COMMUNITY): Payer: Medicare HMO | Admitting: Psychology

## 2018-05-01 DIAGNOSIS — J449 Chronic obstructive pulmonary disease, unspecified: Secondary | ICD-10-CM | POA: Diagnosis not present

## 2018-05-01 DIAGNOSIS — F1994 Other psychoactive substance use, unspecified with psychoactive substance-induced mood disorder: Secondary | ICD-10-CM

## 2018-05-01 DIAGNOSIS — Z6372 Alcoholism and drug addiction in family: Secondary | ICD-10-CM | POA: Diagnosis not present

## 2018-05-01 DIAGNOSIS — J3089 Other allergic rhinitis: Secondary | ICD-10-CM | POA: Diagnosis not present

## 2018-05-01 DIAGNOSIS — F1020 Alcohol dependence, uncomplicated: Secondary | ICD-10-CM | POA: Diagnosis not present

## 2018-05-01 DIAGNOSIS — F341 Dysthymic disorder: Secondary | ICD-10-CM | POA: Diagnosis not present

## 2018-05-01 DIAGNOSIS — Z9189 Other specified personal risk factors, not elsewhere classified: Secondary | ICD-10-CM | POA: Diagnosis not present

## 2018-05-01 DIAGNOSIS — R7303 Prediabetes: Secondary | ICD-10-CM | POA: Diagnosis not present

## 2018-05-01 DIAGNOSIS — R638 Other symptoms and signs concerning food and fluid intake: Secondary | ICD-10-CM | POA: Diagnosis not present

## 2018-05-01 DIAGNOSIS — R69 Illness, unspecified: Secondary | ICD-10-CM | POA: Diagnosis not present

## 2018-05-01 DIAGNOSIS — Z62810 Personal history of physical and sexual abuse in childhood: Secondary | ICD-10-CM | POA: Diagnosis not present

## 2018-05-02 ENCOUNTER — Encounter (HOSPITAL_COMMUNITY): Payer: Self-pay | Admitting: Psychology

## 2018-05-02 NOTE — Progress Notes (Signed)
    Daily Group Progress Note  Program: CD-IOP   05/02/2018 Alan [name] [id number]  Diagnosis:  Alcohol use disorder, severe, dependence ([place])  Substance induced mood disorder ([place])  Stage of Change: Planning, Action  Sobriety Date: 1/22  Group Time: 1-2:30  Participation Level: Active  Behavioral Response: Appropriate and Sharing  Type of Therapy: Process Group  Interventions: CBT  Topic: PTs were active and engaged in group processing session. Counselor facilitated CBT-based and emotion-focused processing questions to help PTs discuss their recovery from mind-altering drugs/alcohol. Emphasis was placed on PTs disclosing their challenges and successes pertaining to their treatment plan goals.     Counselor facilitated topical discussion using recovery-based topics during PT check-ins. PTs responded well to this and talked openly w/ each other.      Group Time: 2:30-4  Participation Level: Active  Behavioral Response: Appropriate and Sharing  Type of Therapy: Psycho-education Group  Interventions: Meditation: Guided Imagery  Topic: Patient were active and engaged in psychoeducation session in which counselor led a mindfulness guided imagery exercise. PTs were instructed to relax their body, focus on breath, and listen to narration of varying seasons and adjusting to change. PTs had various reactions to this, all stated they were able to attempt to follow instructions.     Summary: PT was active, engaged, and upbeat in session. He spoke fondly of attending an [place] meeting that another group member attends also. He talked about picking up his starter chip this week. PT reflected on his past relationships w/ ex wives and the ways he used alcohol to cope w/ negative emotions. PT discussed topic of "inventory". He was encouraging of other members and reminded everyone in group to "stay sober" this upcoming weekend.    UDS collected: No Results: 1/27 positive for  alcohol.  AA/NA attended?: YesThursday  Sponsor?: No   Youlanda Roys, LPCA LCASA 05/02/2018 12:09 PM

## 2018-05-03 ENCOUNTER — Encounter (HOSPITAL_COMMUNITY): Payer: Self-pay | Admitting: Psychology

## 2018-05-05 ENCOUNTER — Encounter (HOSPITAL_COMMUNITY): Payer: Self-pay | Admitting: Medical

## 2018-05-05 ENCOUNTER — Other Ambulatory Visit (HOSPITAL_COMMUNITY): Payer: Medicare HMO | Attending: Psychiatry | Admitting: Psychology

## 2018-05-05 DIAGNOSIS — F1020 Alcohol dependence, uncomplicated: Secondary | ICD-10-CM

## 2018-05-05 DIAGNOSIS — R7303 Prediabetes: Secondary | ICD-10-CM | POA: Diagnosis not present

## 2018-05-05 DIAGNOSIS — Z7982 Long term (current) use of aspirin: Secondary | ICD-10-CM | POA: Diagnosis not present

## 2018-05-05 DIAGNOSIS — Z811 Family history of alcohol abuse and dependence: Secondary | ICD-10-CM

## 2018-05-05 DIAGNOSIS — Z79899 Other long term (current) drug therapy: Secondary | ICD-10-CM | POA: Diagnosis not present

## 2018-05-05 DIAGNOSIS — Z62819 Personal history of unspecified abuse in childhood: Secondary | ICD-10-CM | POA: Insufficient documentation

## 2018-05-05 DIAGNOSIS — H409 Unspecified glaucoma: Secondary | ICD-10-CM

## 2018-05-05 DIAGNOSIS — R69 Illness, unspecified: Secondary | ICD-10-CM | POA: Diagnosis not present

## 2018-05-05 DIAGNOSIS — J3089 Other allergic rhinitis: Secondary | ICD-10-CM

## 2018-05-05 DIAGNOSIS — F341 Dysthymic disorder: Secondary | ICD-10-CM | POA: Insufficient documentation

## 2018-05-05 DIAGNOSIS — T7412XS Child physical abuse, confirmed, sequela: Secondary | ICD-10-CM

## 2018-05-05 DIAGNOSIS — Z6372 Alcoholism and drug addiction in family: Secondary | ICD-10-CM | POA: Diagnosis not present

## 2018-05-05 DIAGNOSIS — F1994 Other psychoactive substance use, unspecified with psychoactive substance-induced mood disorder: Secondary | ICD-10-CM | POA: Insufficient documentation

## 2018-05-05 DIAGNOSIS — J449 Chronic obstructive pulmonary disease, unspecified: Secondary | ICD-10-CM | POA: Insufficient documentation

## 2018-05-05 DIAGNOSIS — R638 Other symptoms and signs concerning food and fluid intake: Secondary | ICD-10-CM

## 2018-05-05 DIAGNOSIS — Z9189 Other specified personal risk factors, not elsewhere classified: Secondary | ICD-10-CM
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
## 2018-05-06 ENCOUNTER — Encounter (HOSPITAL_COMMUNITY): Payer: Self-pay | Admitting: Psychology
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
## 2018-05-07 ENCOUNTER — Encounter (HOSPITAL_COMMUNITY): Payer: Self-pay

## 2018-05-07 ENCOUNTER — Other Ambulatory Visit (HOSPITAL_COMMUNITY): Payer: Medicare HMO | Admitting: Psychology

## 2018-05-07 DIAGNOSIS — Z7982 Long term (current) use of aspirin: Secondary | ICD-10-CM | POA: Diagnosis not present

## 2018-05-07 DIAGNOSIS — F1020 Alcohol dependence, uncomplicated: Secondary | ICD-10-CM

## 2018-05-07 DIAGNOSIS — Z6372 Alcoholism and drug addiction in family: Secondary | ICD-10-CM

## 2018-05-07 DIAGNOSIS — F341 Dysthymic disorder: Secondary | ICD-10-CM | POA: Diagnosis not present

## 2018-05-07 DIAGNOSIS — H409 Unspecified glaucoma: Secondary | ICD-10-CM | POA: Diagnosis not present

## 2018-05-07 DIAGNOSIS — J449 Chronic obstructive pulmonary disease, unspecified: Secondary | ICD-10-CM | POA: Diagnosis not present

## 2018-05-07 DIAGNOSIS — R69 Illness, unspecified: Secondary | ICD-10-CM | POA: Diagnosis not present

## 2018-05-07 DIAGNOSIS — R05 Cough: Secondary | ICD-10-CM

## 2018-05-07 DIAGNOSIS — R7303 Prediabetes: Secondary | ICD-10-CM | POA: Diagnosis not present

## 2018-05-07 DIAGNOSIS — R059 Cough, unspecified: Secondary | ICD-10-CM

## 2018-05-07 DIAGNOSIS — F1994 Other psychoactive substance use, unspecified with psychoactive substance-induced mood disorder: Secondary | ICD-10-CM | POA: Diagnosis not present

## 2018-05-07 DIAGNOSIS — Z811 Family history of alcohol abuse and dependence: Secondary | ICD-10-CM

## 2018-05-07 DIAGNOSIS — Z62819 Personal history of unspecified abuse in childhood: Secondary | ICD-10-CM | POA: Diagnosis not present

## 2018-05-07 DIAGNOSIS — Z9189 Other specified personal risk factors, not elsewhere classified: Secondary | ICD-10-CM

## 2018-05-07 DIAGNOSIS — Z79899 Other long term (current) drug therapy: Secondary | ICD-10-CM | POA: Diagnosis not present

## 2018-05-07 MED ORDER — BUDESONIDE-FORMOTEROL FUMARATE 160-4.5 MCG/ACT IN AERO: 2.0000 | INHALATION_SPRAY | Freq: Two times a day (BID) | RESPIRATORY_TRACT | 3 refills | Status: DC
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
## 2018-05-08 ENCOUNTER — Encounter (HOSPITAL_COMMUNITY): Payer: Self-pay | Admitting: Psychology

## 2018-05-08 ENCOUNTER — Other Ambulatory Visit (HOSPITAL_COMMUNITY): Payer: Medicare HMO | Admitting: Psychology

## 2018-05-08 DIAGNOSIS — R7303 Prediabetes: Secondary | ICD-10-CM | POA: Diagnosis not present

## 2018-05-08 DIAGNOSIS — F1020 Alcohol dependence, uncomplicated: Secondary | ICD-10-CM

## 2018-05-08 DIAGNOSIS — J449 Chronic obstructive pulmonary disease, unspecified: Secondary | ICD-10-CM | POA: Diagnosis not present

## 2018-05-08 DIAGNOSIS — F1994 Other psychoactive substance use, unspecified with psychoactive substance-induced mood disorder: Secondary | ICD-10-CM | POA: Diagnosis not present

## 2018-05-08 DIAGNOSIS — H409 Unspecified glaucoma: Secondary | ICD-10-CM | POA: Diagnosis not present

## 2018-05-08 DIAGNOSIS — Z79899 Other long term (current) drug therapy: Secondary | ICD-10-CM | POA: Diagnosis not present

## 2018-05-08 DIAGNOSIS — F341 Dysthymic disorder: Secondary | ICD-10-CM | POA: Diagnosis not present

## 2018-05-08 DIAGNOSIS — Z9189 Other specified personal risk factors, not elsewhere classified: Secondary | ICD-10-CM

## 2018-05-08 DIAGNOSIS — R69 Illness, unspecified: Secondary | ICD-10-CM | POA: Diagnosis not present

## 2018-05-08 DIAGNOSIS — Z62819 Personal history of unspecified abuse in childhood: Secondary | ICD-10-CM | POA: Diagnosis not present

## 2018-05-08 DIAGNOSIS — Z6372 Alcoholism and drug addiction in family: Secondary | ICD-10-CM | POA: Diagnosis not present

## 2018-05-08 DIAGNOSIS — Z7982 Long term (current) use of aspirin: Secondary | ICD-10-CM | POA: Diagnosis not present

## 2018-05-08 NOTE — Progress Notes (Signed)
    Daily Group Progress Note  Program: CD-IOP   05/08/2018 KACIN DANCY [id number]  Diagnosis: Alcohol Use Disorder, Severe, Chronic PTSD  Sobriety Date: 04/28/18  Group Time: 1-2:30pm  Participation Level: Active  Behavioral Response: Sharing  Type of Therapy: Process Group  Interventions: Supportive  Topic: Process: the first half of group was spent in process. Members shared about their progress in early recovery, including meetings attended, step work and any other activities that promote sobriety. A new group member was present and during this half of group, he introduced himself. The medical director met with one member for discharge plan and another for his initial evaluation. Four drug tests were collected. One group member was absent.  Group Time: 2:30-4pm  Participation Level: Active  Behavioral Response: Sharing  Type of Therapy: Psycho-education Group  Interventions: CBT  Topic: Psycho-Education: "Values Sort". The second half of group was spent in a psychoeducation session. Members were provided with thirty-one slips of paper with a value listed on each slip. Members were 'guided' through the process of gradually reducing the number of these values until they had eliminated all but the three most important ones. The remainder of the session consisted of a discussion on what had led to identifying these final three values. Further, they were invited to share how they can live out these values in their everyday life.   Summary: The patient reported he had attended two [place] meetings since we last met. He shared with this counselor (who has been absent for the last week) that "I had a half of bottle of wine with my S/O and 'it cost me one week of sobriety'". He admitted, "it was a stupid moment" and was confident it would not happen again. The patient talked about his [place] meeting attendance and noted that he would probably be 'attending [place] meetings for the rest of my  life'. In the psycho-ed, the patient identified: 'family, belonging to a group and having fun' as his three values. When the counselor noted his lack of family connection, the patient explained that he was closer to his S/O's wife than his own family. He also reminded the group that "I am a loner". This contradicted his previous admittance that he liked belonging to a group. The patient is very gradually sharing himself with the group, but he is very out of touch with his feelings and is reluctant to even go there. He responded well to this intervention based on his emotional awareness, but we will continue to focus on expanding his awareness of his feelings.   UDS collected: No  AA/NA attended?: [place] and Wednesday  Sponsor?: No   [name], LCAS 05/08/2018 11:28 AM

## 2018-05-10 ENCOUNTER — Encounter: Payer: Self-pay | Admitting: Sports Medicine

## 2018-05-12 ENCOUNTER — Encounter (HOSPITAL_COMMUNITY): Payer: Self-pay | Admitting: Psychology

## 2018-05-12 ENCOUNTER — Encounter (HOSPITAL_COMMUNITY): Payer: Self-pay

## 2018-05-12 ENCOUNTER — Other Ambulatory Visit (HOSPITAL_COMMUNITY): Payer: Medicare HMO | Admitting: Psychology

## 2018-05-12 DIAGNOSIS — F1020 Alcohol dependence, uncomplicated: Secondary | ICD-10-CM

## 2018-05-12 NOTE — Progress Notes (Signed)
    Daily Group Progress Note  Program: CD-IOP   05/12/2018 Alan [name] [id number]  Diagnosis: Alcohol Use Disorder, Severe  Sobriety Date: 04/28/18  Group Time: 1-2:30pm  Participation Level: Active  Behavioral Response: Sharing  Type of Therapy: Process Group  Interventions: Supportive  Topic: Process: The first part of group began with process. Group members shared their experiences in early recovery since we last met on Wednesday.  Four group members were absent due to weather conditions. One group member had to leave early because of the weather. One drug test was collected. One group member graduated.   Group Time: 2:30-4pm  Participation Level: Active  Behavioral Response: Appropriate  Type of Therapy: Psycho-education Group  Interventions: Art Therapy  Topic: Psycho-ed: Masks and Graduation; The second half of group was spent in psycho-ed discussing the "selves" that we share with the world and the "selves" that we are when we are alone. Group members were each given a blank mask and art supplies and told to decorate the front of their mask with the person they portray to the outside world and to decorate the inside with how they really feel about themselves. Group members were given 25 minutes to decorate their masks and then shared their masks with the group afterwards. Group members were engaged and thoughtful during this intervention. The last thirty minutes of group was spent in a graduation ceremony with brownies and passing around the [place]. The patient's father was present for the graduation and it was an emotional time for father and son.    Summary: The patient reported that he had attended one [place] meeting since we last met yesterday on Wednesday. The patient shared that after he left group yesterday, the patient went home and his girlfriend came over and cooked dinner. The patient went to the 8 AM AA meeting this morning and shared that he "loves the 8  am meeting." The patient shared that going to this meeting helps him start his morning off right. During the psycho-ed portion of group, the patient decorated the outside of his mask with the words such as "God" and "Happy." On the inside on the mask, the patient wrote "[name]," "sister," and "family." The patient shared that these individuals are what really matters to him and they are always on his mind.    UDS collected: No  AA/NA attended?: YesThursday  Sponsor?: Yes   [name], [place] 05/12/2018 4:47 PM

## 2018-05-12 NOTE — Progress Notes (Signed)
    Daily Group Progress Note  Program: CD-IOP   05/12/2018 Alan [name] [id number]  Diagnosis:  Alcohol use disorder, severe, dependence ([place])  Stage of Change: Planning, Action  Sobriety Date: 1/27  Group Time: 1-2:30  Participation Level: Active  Behavioral Response: Appropriate and Sharing  Type of Therapy: Process Group  Interventions: CBT  Topic: PTs were active and engaged in group processing session. Counselor facilitated CBT-based and emotion-focused processing questions to help PTs discuss their recovery from mind-altering drugs/alcohol. Emphasis was placed on PTs disclosing their challenges and successes pertaining to their treatment plan goals.     One PT met w/ program director for initial visit. UDS results were returned to some members. UDS samples were collected from 4 members.      Group Time: 2:30-4  Participation Level: Active  Behavioral Response: Appropriate and Sharing  Type of Therapy: Psycho-education Group  Interventions: CBT and Psychosocial Skills: Communication  Topic: Patient were active and engaged in psychoeducation session in which counselor led a 1 hour session on Communicaiton. Counselor led an exercised in which PTs drew geometric patterns and practiced communicating w/ each other. PTs were invited to identify their areas of growth regarding their own communication.     Summary: PT was active, engaged, and reported attending 2 AA meetings. He continues to practice a weekend routine of spending time w/ his partner, taking care of others, and attending church. PT reported his partner is going to the beach this week so he will be spending the week alone. PT was asked if he has a sponsor yet, to which he replied 'yes'. This was confirmed by another group member who has seen them at meetings together.    UDS collected: No Results: 2/3 negative  AA/NA attended?: YesFriday and Saturday  Sponsor?: Yes   Youlanda Roys, [facility],  [place] 05/12/2018 5:27 PM

## 2018-05-14 ENCOUNTER — Encounter (HOSPITAL_COMMUNITY): Payer: Self-pay | Admitting: Medical

## 2018-05-14 ENCOUNTER — Other Ambulatory Visit (HOSPITAL_COMMUNITY): Payer: Medicare HMO | Admitting: Psychology

## 2018-05-14 DIAGNOSIS — T7412XS Child physical abuse, confirmed, sequela: Secondary | ICD-10-CM

## 2018-05-14 DIAGNOSIS — F1020 Alcohol dependence, uncomplicated: Secondary | ICD-10-CM

## 2018-05-14 DIAGNOSIS — H409 Unspecified glaucoma: Secondary | ICD-10-CM

## 2018-05-14 DIAGNOSIS — F341 Dysthymic disorder: Secondary | ICD-10-CM | POA: Diagnosis not present

## 2018-05-14 DIAGNOSIS — R7303 Prediabetes: Secondary | ICD-10-CM

## 2018-05-14 DIAGNOSIS — J449 Chronic obstructive pulmonary disease, unspecified: Secondary | ICD-10-CM | POA: Diagnosis not present

## 2018-05-14 DIAGNOSIS — J3089 Other allergic rhinitis: Secondary | ICD-10-CM

## 2018-05-14 DIAGNOSIS — Z9189 Other specified personal risk factors, not elsewhere classified: Secondary | ICD-10-CM

## 2018-05-14 DIAGNOSIS — Z79899 Other long term (current) drug therapy: Secondary | ICD-10-CM | POA: Diagnosis not present

## 2018-05-14 DIAGNOSIS — Z6372 Alcoholism and drug addiction in family: Secondary | ICD-10-CM

## 2018-05-14 DIAGNOSIS — Z811 Family history of alcohol abuse and dependence: Secondary | ICD-10-CM

## 2018-05-14 DIAGNOSIS — Z7982 Long term (current) use of aspirin: Secondary | ICD-10-CM | POA: Diagnosis not present

## 2018-05-14 DIAGNOSIS — R69 Illness, unspecified: Secondary | ICD-10-CM | POA: Diagnosis not present

## 2018-05-14 DIAGNOSIS — F1994 Other psychoactive substance use, unspecified with psychoactive substance-induced mood disorder: Secondary | ICD-10-CM | POA: Diagnosis not present

## 2018-05-14 DIAGNOSIS — Z62819 Personal history of unspecified abuse in childhood: Secondary | ICD-10-CM | POA: Diagnosis not present

## 2018-05-14 MED ORDER — TRAZODONE HCL 50 MG PO TABS: 50.0000 mg | ORAL_TABLET | Freq: Every evening | ORAL | 2 refills | Status: DC | PRN
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
## 2018-05-15 ENCOUNTER — Other Ambulatory Visit (HOSPITAL_COMMUNITY): Payer: Medicare HMO | Admitting: Psychology

## 2018-05-15 DIAGNOSIS — F1020 Alcohol dependence, uncomplicated: Secondary | ICD-10-CM

## 2018-05-15 DIAGNOSIS — Z9189 Other specified personal risk factors, not elsewhere classified: Secondary | ICD-10-CM

## 2018-05-16 ENCOUNTER — Encounter (HOSPITAL_COMMUNITY): Payer: Self-pay | Admitting: Psychology
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
## 2018-05-19 ENCOUNTER — Other Ambulatory Visit (HOSPITAL_COMMUNITY): Payer: Medicare HMO | Admitting: Psychology

## 2018-05-19 DIAGNOSIS — F1994 Other psychoactive substance use, unspecified with psychoactive substance-induced mood disorder: Secondary | ICD-10-CM

## 2018-05-19 DIAGNOSIS — F1020 Alcohol dependence, uncomplicated: Secondary | ICD-10-CM

## 2018-05-20 ENCOUNTER — Other Ambulatory Visit (HOSPITAL_COMMUNITY): Payer: Self-pay | Admitting: Medical

## 2018-05-20 ENCOUNTER — Encounter (HOSPITAL_COMMUNITY): Payer: Self-pay | Admitting: Psychology

## 2018-05-20 NOTE — Progress Notes (Signed)
Daily Group Progress Note  Program: CD-IOP   05/20/2018 Alan [name] [id number]  Diagnosis: Alcohol Use Disorder, Severe  Sobriety Date: 04/23/18  Group Time: 1-2:30pm  Participation Level: Active  Behavioral Response: Sharing  Type of Therapy: Process Group  Interventions: Supportive  Topic: Process: The first part of group began with process. Group members shared their experiences in early recovery since we last met on Wednesday.  A popsicle stick check-in intervention was used to help group members focus their shares on topics related to recovery. One drug test was collected. All group members were present but one group member left early to pick her children up from school.   Group Time: 2:30-4pm  Participation Level: Active  Behavioral Response: Sharing  Type of Therapy: Psycho-education Group  Interventions: Assertiveness Training  Topic: Psycho-ed: Administrator, arts; The second half of group was spent in psycho-ed practicing different communication styles. Group members watched a short video clip providing examples of the different types of communication. The video clip focused particularly on passive aggressive communication since the group had questions about this communication style. After watching the clip, group members engaged in a role-play exercise where they were given different scenarios and asked to first practice passive or passive aggression communication and then assertive communication.   Summary: The patient reported he had attended one [place] meeting since we last met yesterday on Wednesday. The popsicle stick word the patient received was "loneliness." The patient shared that his girlfriend is out of town this week and he is lonely without her company. The patient shared that he "doesn't have the bottle" now and has been filling his time by cooking his own dinner and watching TV. The patient shared that in active addiction; he would have been  passed out in the evenings and would have never been awake for the evening shows. During the psycho-ed portion of group, the patient enacted a situation where a family member was calling another family member for Charity fundraiser services and being pushy about needing it done today. The patient played the role of the needy family member. He processed the role play with the other group member who practiced assertive communication.    UDS collected: Yes Results: negative  AA/NA attended?: YesThursday  Sponsor?: Yes   [name], LCAS 05/20/2018 8:20 AM

## 2018-05-21 ENCOUNTER — Other Ambulatory Visit (HOSPITAL_COMMUNITY): Payer: Medicare HMO | Admitting: Psychology

## 2018-05-21 DIAGNOSIS — T7412XS Child physical abuse, confirmed, sequela: Secondary | ICD-10-CM

## 2018-05-21 DIAGNOSIS — R7303 Prediabetes: Secondary | ICD-10-CM | POA: Diagnosis not present

## 2018-05-21 DIAGNOSIS — J449 Chronic obstructive pulmonary disease, unspecified: Secondary | ICD-10-CM | POA: Diagnosis not present

## 2018-05-21 DIAGNOSIS — F1020 Alcohol dependence, uncomplicated: Secondary | ICD-10-CM

## 2018-05-21 DIAGNOSIS — H409 Unspecified glaucoma: Secondary | ICD-10-CM | POA: Diagnosis not present

## 2018-05-21 DIAGNOSIS — Z79899 Other long term (current) drug therapy: Secondary | ICD-10-CM | POA: Diagnosis not present

## 2018-05-21 DIAGNOSIS — F1994 Other psychoactive substance use, unspecified with psychoactive substance-induced mood disorder: Secondary | ICD-10-CM | POA: Diagnosis not present

## 2018-05-21 DIAGNOSIS — Z62819 Personal history of unspecified abuse in childhood: Secondary | ICD-10-CM | POA: Diagnosis not present

## 2018-05-21 DIAGNOSIS — Z9189 Other specified personal risk factors, not elsewhere classified: Secondary | ICD-10-CM

## 2018-05-21 DIAGNOSIS — F341 Dysthymic disorder: Secondary | ICD-10-CM | POA: Diagnosis not present

## 2018-05-21 DIAGNOSIS — Z7982 Long term (current) use of aspirin: Secondary | ICD-10-CM | POA: Diagnosis not present

## 2018-05-21 DIAGNOSIS — Z6372 Alcoholism and drug addiction in family: Secondary | ICD-10-CM | POA: Diagnosis not present

## 2018-05-21 DIAGNOSIS — R69 Illness, unspecified: Secondary | ICD-10-CM | POA: Diagnosis not present

## 2018-05-22 ENCOUNTER — Encounter (HOSPITAL_COMMUNITY): Payer: Self-pay | Admitting: Psychology

## 2018-05-22 ENCOUNTER — Other Ambulatory Visit (HOSPITAL_COMMUNITY): Payer: Medicare HMO | Admitting: Psychology

## 2018-05-22 DIAGNOSIS — F1020 Alcohol dependence, uncomplicated: Secondary | ICD-10-CM

## 2018-05-22 DIAGNOSIS — R69 Illness, unspecified: Secondary | ICD-10-CM | POA: Diagnosis not present

## 2018-05-22 DIAGNOSIS — Z6372 Alcoholism and drug addiction in family: Secondary | ICD-10-CM | POA: Diagnosis not present

## 2018-05-22 DIAGNOSIS — H409 Unspecified glaucoma: Secondary | ICD-10-CM | POA: Diagnosis not present

## 2018-05-22 DIAGNOSIS — F341 Dysthymic disorder: Secondary | ICD-10-CM | POA: Diagnosis not present

## 2018-05-22 DIAGNOSIS — F1994 Other psychoactive substance use, unspecified with psychoactive substance-induced mood disorder: Secondary | ICD-10-CM | POA: Diagnosis not present

## 2018-05-22 DIAGNOSIS — Z7982 Long term (current) use of aspirin: Secondary | ICD-10-CM | POA: Diagnosis not present

## 2018-05-22 DIAGNOSIS — R7303 Prediabetes: Secondary | ICD-10-CM | POA: Diagnosis not present

## 2018-05-22 DIAGNOSIS — Z79899 Other long term (current) drug therapy: Secondary | ICD-10-CM | POA: Diagnosis not present

## 2018-05-22 DIAGNOSIS — Z62819 Personal history of unspecified abuse in childhood: Secondary | ICD-10-CM | POA: Diagnosis not present

## 2018-05-22 DIAGNOSIS — T7412XS Child physical abuse, confirmed, sequela: Secondary | ICD-10-CM

## 2018-05-22 DIAGNOSIS — J449 Chronic obstructive pulmonary disease, unspecified: Secondary | ICD-10-CM | POA: Diagnosis not present
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
## 2018-05-23 ENCOUNTER — Encounter (HOSPITAL_COMMUNITY): Payer: Self-pay | Admitting: Psychology

## 2018-05-23 NOTE — Progress Notes (Signed)
    Daily Group Progress Note  Program: CD-IOP   05/23/2018 Alan [name] [id number]  Diagnosis: Alcohol Use Disorder, Severe  Sobriety Date: 04/23/18  Group Time: 1-2:30pm  Participation Level: Active  Behavioral Response: Sharing  Type of Therapy: Process Group  Interventions: Supportive  Topic: Process: the first half of group was spent in process. Members shared about the progress in early recovery. They identified meetings they had attended, working with their sponsor on step work and the ways they are staying healthy and sober. "Shares" also included any challenges or frustrations along the way. Three drug tests were collected. The medical director met with one patient for a med check and follow-up.   Group Time: 2:30-4pm  Participation Level: Active  Behavioral Response: Sharing  Type of Therapy: Psycho-education Group  Interventions: Emotions  Topic: Psycho-Education: Chaplain. The second half of group consisted of a psycho-ed with a chaplain. [name], [facility] Chaplain, led the session on the topic of "Feelings'. They session was poignant with members sharing about current feelings, but also feelings developed or experienced in childhood. Everyone participated in this session.   Summary: The patient reported that 'yesterday was a super day'. He had attended the 8 am AA meeting at the [employer], did some shopping and then met with the men's prayer group at noon at his church. At night, a group of folks 55 plus met at church for a meal. The patient agreed with the counselor that he is living his daily life in a way that is congruent with his faith, instead of getting drunk every afternoon and passing out by dinner time. he noted that his pastor and the associate pastor are both aware of his struggle with alcohol and he has them to talk to anytime he needs additional support. In the psycho-ed, the patient was given an emotion to depict and the group easily  identified the emotion he was portraying. The patient continues to make measurable progress in early recovery. He responded well to this intervention.    UDS collected: Yes Results: pending  AA/NA attended?: YesTuesday  Sponsor?: Yes   [name], [place] 05/23/2018 8:31 AM

## 2018-05-23 NOTE — Progress Notes (Signed)
    Daily Group Progress Note  Program: CD-IOP   05/23/2018 DERIN GRANQUIST [id number]  Diagnosis:  Alcohol use disorder, severe, dependence ([place])  Substance induced mood disorder ([place])  Stage of Change: Planning, Action  Sobriety Date: 1/27  Group Time: 1-2:30  Participation Level: Active  Behavioral Response: Appropriate and Sharing  Type of Therapy: Process Group  Interventions: CBT  Topic: PTs were active and engaged in group processing session. Counselor facilitated CBT-based and emotion-focused processing questions to help PTs discuss their recovery from mind-altering drugs/alcohol. Emphasis was placed on PTs disclosing their challenges and successes pertaining to their treatment plan goals.           Group Time: 2:30-4  Participation Level: Active  Behavioral Response: Appropriate and Sharing  Type of Therapy: Psycho-education Group  Interventions: CBT and Other: Emotion Regulation  Topic: Patient were active and engaged in psychoeducation session in which counselor led a 1 hour session on Emotional Regulation and increasing emotional vocabulary. PTs played a game of [place] w/ emotions written on blocks and were asked to identify feelings and show examples of the feeling.     Summary: PT was active, engaged in session. Reported attending 1 AA meeting over weekend. PT went to house of life-long friend who continues to drink 12pack daily and noticed how unlikable alcohol makes people. PT was offered beer and PT refused. PT's S/O returned from trip to beach. PT completed normal weekend routine. PT shows growing insight into his ability to be present w/ his emotions in group and share them w/ others in group. PT continues pattern of attending AA daily at 8AM during week.    UDS collected: No Results: negative  AA/NA attended?: [facility]  Sponsor?: Yes   Youlanda Roys, [place] 05/23/2018 1:02 PM

## 2018-05-26 ENCOUNTER — Other Ambulatory Visit (HOSPITAL_COMMUNITY): Payer: Medicare HMO | Admitting: Psychology

## 2018-05-26 DIAGNOSIS — F1020 Alcohol dependence, uncomplicated: Secondary | ICD-10-CM | POA: Diagnosis not present

## 2018-05-26 DIAGNOSIS — F1994 Other psychoactive substance use, unspecified with psychoactive substance-induced mood disorder: Secondary | ICD-10-CM | POA: Diagnosis not present

## 2018-05-26 DIAGNOSIS — J449 Chronic obstructive pulmonary disease, unspecified: Secondary | ICD-10-CM | POA: Diagnosis not present

## 2018-05-26 DIAGNOSIS — Z62819 Personal history of unspecified abuse in childhood: Secondary | ICD-10-CM | POA: Diagnosis not present

## 2018-05-26 DIAGNOSIS — H409 Unspecified glaucoma: Secondary | ICD-10-CM | POA: Diagnosis not present

## 2018-05-26 DIAGNOSIS — Z79899 Other long term (current) drug therapy: Secondary | ICD-10-CM | POA: Diagnosis not present

## 2018-05-26 DIAGNOSIS — R69 Illness, unspecified: Secondary | ICD-10-CM | POA: Diagnosis not present

## 2018-05-26 DIAGNOSIS — F341 Dysthymic disorder: Secondary | ICD-10-CM | POA: Diagnosis not present

## 2018-05-26 DIAGNOSIS — Z6372 Alcoholism and drug addiction in family: Secondary | ICD-10-CM | POA: Diagnosis not present

## 2018-05-26 DIAGNOSIS — Z7982 Long term (current) use of aspirin: Secondary | ICD-10-CM | POA: Diagnosis not present

## 2018-05-26 DIAGNOSIS — R7303 Prediabetes: Secondary | ICD-10-CM | POA: Diagnosis not present

## 2018-05-27 ENCOUNTER — Encounter (HOSPITAL_COMMUNITY): Payer: Self-pay | Admitting: Psychology

## 2018-05-27 NOTE — Progress Notes (Signed)
    Daily Group Progress Note  Program: CD-IOP   05/27/2018 Alan [name] [id number]  Diagnosis: Alcohol Use Disorder, Severe  Sobriety Date: 04/28/18  Group Time: 1-2:30pm  Participation Level: Active  Behavioral Response: Sharing  Type of Therapy: Process Group  Interventions: Supportive  Topic: Process: The first part of group of began with process. Group members shared their experiences in early recovery since we last met on Wednesday. All group members were present today.  Group Time: 2:30-4pm  Participation Level: Active  Behavioral Response: Appropriate  Type of Therapy: Psycho-education Group  Interventions: Psychosocial Skills: Vulnerability  Topic: Psycho-ed: Vulnerability Video; The second half of group was spent in psycho-ed discussing the importance of Vulnerability. Group members were shown a TED talk given by Almond Lint on Vulnerability as the key to human connection. After the video was over, group members talked about their thoughts on video. Group members focused on topics such as shame preventing vulnerability, numbing uncomfortable emotions, and learning to accept themselves.   Summary: The patient reported that he had attended one [place] meeting since we last met yesterday on Wednesday. 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 The patient's girlfriend commented "wow you didn't blow up" in reference to the patient's ability to remain calm during this stressful situation. While active in his addiction, the patient shared that he would have became angry if he couldn't find a house on the route. The patient shared that it "made me feel good" to be able handle the situation calmly. During the psycho-ed portion of group, the patient attentively watched the video. The  patient shared that he used to struggle a lot with vulnerability, but it has gotten easier for him to be vulnerable with his girlfriend.    UDS collected: No   AA/NA attended?: YesThursday  Sponsor?: Yes   [name], [place] 05/27/2018 9:41 AM

## 2018-05-27 NOTE — Progress Notes (Signed)
    Daily Group Progress Note  Program: CD-IOP   05/27/2018 ULISES WOLFINGER [id number]  Diagnosis:  Alcohol use disorder, severe, dependence ([place])  Substance induced mood disorder ([place])  Stage of Change: Planning, Action  Sobriety Date: 1/27  Group Time: 1-2:30  Participation Level: Active  Behavioral Response: Appropriate and Sharing  Type of Therapy: Process Group  Interventions: CBT  Topic: PTs were active and engaged in group processing session. Counselor facilitated CBT-based and emotion-focused processing questions to help PTs discuss their recovery from mind-altering drugs/alcohol. Emphasis was placed on PTs disclosing their challenges and successes pertaining to their treatment plan goals.  One new member was present and met w/ medical director to establish care. One member graduated successfully from group and met w/ Market researcher for d/c. UDS samples were collected from 5 group members. One member had relapsed over weekend and felt ashamed. Group offered suppportive feedback.       Group Time: 2:30-4  Participation Level: Active  Behavioral Response: Appropriate and Sharing  Type of Therapy: Psycho-education Group  Interventions: CBT  Topic: Patient were active and engaged in psychoeducation session in which counselor led a 1 hour session on "Realistic Self Talk", provided a handout on improving self talk during crisis and anxiety. PTs shared their favorite responses and practiced out loud.    Summary: Pt was active, engaged, upbeat in session. He was welcoming to a new group member and compared the group to "like a family". PT reported attending 2 AA meetings over weekend. He is helping out at meetings by giving out sober chips. He states he joined a gym and is hoping to attend at least 2 per week to get back into exercise routine.    UDS collected: No Results: negative, most recent 2/19 negative  AA/NA attended?: YesFriday and Saturday  Sponsor?: Yes   Youlanda Roys, [facility], [place] 05/27/2018 3:22 PM

## 2018-05-28 ENCOUNTER — Other Ambulatory Visit (HOSPITAL_COMMUNITY): Payer: Medicare HMO | Admitting: Psychology

## 2018-05-28 DIAGNOSIS — Z79899 Other long term (current) drug therapy: Secondary | ICD-10-CM | POA: Diagnosis not present

## 2018-05-28 DIAGNOSIS — F1994 Other psychoactive substance use, unspecified with psychoactive substance-induced mood disorder: Secondary | ICD-10-CM | POA: Diagnosis not present

## 2018-05-28 DIAGNOSIS — H409 Unspecified glaucoma: Secondary | ICD-10-CM | POA: Diagnosis not present

## 2018-05-28 DIAGNOSIS — F1020 Alcohol dependence, uncomplicated: Secondary | ICD-10-CM | POA: Diagnosis not present

## 2018-05-28 DIAGNOSIS — R69 Illness, unspecified: Secondary | ICD-10-CM | POA: Diagnosis not present

## 2018-05-28 DIAGNOSIS — Z7982 Long term (current) use of aspirin: Secondary | ICD-10-CM | POA: Diagnosis not present

## 2018-05-28 DIAGNOSIS — Z62819 Personal history of unspecified abuse in childhood: Secondary | ICD-10-CM | POA: Diagnosis not present

## 2018-05-28 DIAGNOSIS — Z6372 Alcoholism and drug addiction in family: Secondary | ICD-10-CM | POA: Diagnosis not present

## 2018-05-28 DIAGNOSIS — F341 Dysthymic disorder: Secondary | ICD-10-CM | POA: Diagnosis not present

## 2018-05-28 DIAGNOSIS — Z9189 Other specified personal risk factors, not elsewhere classified: Secondary | ICD-10-CM

## 2018-05-28 DIAGNOSIS — R7303 Prediabetes: Secondary | ICD-10-CM | POA: Diagnosis not present

## 2018-05-28 DIAGNOSIS — J449 Chronic obstructive pulmonary disease, unspecified: Secondary | ICD-10-CM | POA: Diagnosis not present

## 2018-05-29 ENCOUNTER — Other Ambulatory Visit (HOSPITAL_COMMUNITY): Payer: Medicare HMO | Admitting: Psychology

## 2018-05-29 ENCOUNTER — Encounter (HOSPITAL_COMMUNITY): Payer: Self-pay | Admitting: Psychology

## 2018-05-29 DIAGNOSIS — J449 Chronic obstructive pulmonary disease, unspecified: Secondary | ICD-10-CM | POA: Diagnosis not present

## 2018-05-29 DIAGNOSIS — F341 Dysthymic disorder: Secondary | ICD-10-CM | POA: Diagnosis not present

## 2018-05-29 DIAGNOSIS — Z6372 Alcoholism and drug addiction in family: Secondary | ICD-10-CM | POA: Diagnosis not present

## 2018-05-29 DIAGNOSIS — Z79899 Other long term (current) drug therapy: Secondary | ICD-10-CM | POA: Diagnosis not present

## 2018-05-29 DIAGNOSIS — F1020 Alcohol dependence, uncomplicated: Secondary | ICD-10-CM | POA: Diagnosis not present

## 2018-05-29 DIAGNOSIS — R69 Illness, unspecified: Secondary | ICD-10-CM | POA: Diagnosis not present

## 2018-05-29 DIAGNOSIS — H409 Unspecified glaucoma: Secondary | ICD-10-CM | POA: Diagnosis not present

## 2018-05-29 DIAGNOSIS — R7303 Prediabetes: Secondary | ICD-10-CM | POA: Diagnosis not present

## 2018-05-29 DIAGNOSIS — F1994 Other psychoactive substance use, unspecified with psychoactive substance-induced mood disorder: Secondary | ICD-10-CM | POA: Diagnosis not present

## 2018-05-29 DIAGNOSIS — Z7982 Long term (current) use of aspirin: Secondary | ICD-10-CM | POA: Diagnosis not present

## 2018-05-29 DIAGNOSIS — Z62819 Personal history of unspecified abuse in childhood: Secondary | ICD-10-CM | POA: Diagnosis not present

## 2018-05-30 ENCOUNTER — Encounter: Payer: Self-pay | Admitting: Podiatry

## 2018-05-30 ENCOUNTER — Other Ambulatory Visit: Payer: Self-pay | Admitting: Podiatry

## 2018-05-30 ENCOUNTER — Encounter (HOSPITAL_COMMUNITY): Payer: Self-pay | Admitting: Psychology

## 2018-05-30 ENCOUNTER — Ambulatory Visit: Payer: Medicare HMO | Admitting: Podiatry

## 2018-05-30 ENCOUNTER — Ambulatory Visit (INDEPENDENT_AMBULATORY_CARE_PROVIDER_SITE_OTHER): Payer: Medicare HMO

## 2018-05-30 DIAGNOSIS — M7672 Peroneal tendinitis, left leg: Secondary | ICD-10-CM

## 2018-05-30 DIAGNOSIS — M79671 Pain in right foot: Secondary | ICD-10-CM

## 2018-05-30 DIAGNOSIS — M7670 Peroneal tendinitis, unspecified leg: Secondary | ICD-10-CM

## 2018-05-30 DIAGNOSIS — M7671 Peroneal tendinitis, right leg: Secondary | ICD-10-CM

## 2018-05-30 DIAGNOSIS — M79672 Pain in left foot: Principal | ICD-10-CM

## 2018-05-30 MED ORDER — TRIAMCINOLONE ACETONIDE 10 MG/ML IJ SUSP
10.0000 mg | Freq: Once | INTRAMUSCULAR | Status: AC
  Administered 2018-05-30: 10 mg

## 2018-05-30 NOTE — Progress Notes (Signed)
    Daily Group Progress Note  Program: CD-IOP   05/30/2018 Alan [name] [id number]  Diagnosis: Alcohol Use Disorder, Severe  Sobriety Date: 04/28/18  Group Time: 1-2:30pm  Participation Level: Active  Behavioral Response: Appropriate and Sharing  Type of Therapy: Process Group  Interventions: Supportive  Topic: Process: The first half of group was spent in process. Members shared about any challenges ort struggles in early recovery as well as any successes. They discussed meetings they had attended, increased understanding through sponsor and step work along with just gaining more days of sobriety. A nerw group member was present and he introduced himself during this half of group. Three drug tests were collected.   Group Time: 2:30-4pm  Participation Level: Active  Behavioral Response: Appropriate  Type of Therapy: Psycho-education Group  Interventions: Strength-based  Topic: Psycho-education: Neurobiology of Addiction. The second half of group consisted of a brief YouTube and then slide show on the neurobiology of addiction. The biological nature of this disease was emphasized along with the power of the limbic system in overriding the pre-frontal cortex. Members shared in discussion about their own experiences and seemingly "out of control". Others agreed identifying the incongruence of behaviors while impaired and their value system. Members were engaged and active in this session.   Summary: The patient reported he had attended two [place] meetings since we last met and noted he visits with his sponsor either before or after each meeting. Yesterday he attended the men's Prayer group at noon at his church. He will be getting his 30-day chip tomorrow and his sponsor will be giving it to him. The counselor questioned when was the last time he had been alcohol-free for thirty days? The patient stopped for a moment and admitted it was probably in his teens because "I've drank my  whole life". The patient was very welcoming to the new group member present today. He is always very welcoming and validating to his fellow group members. In the psycho-ed the patient was attentive and made some good comments. He continues to make measurable progress in early recovery and responded well to this intervention.    UDS collected: Yes Results: Sample spilled, unable to test  AA/NA attended?: YesTuesday and Wednesday  Sponsor?: Yes   [name], LCAS 05/30/2018 9:49 AM

## 2018-05-31 NOTE — Progress Notes (Signed)
Subjective:   [name], male   DOB: 73 y.o.   MRN: [id number]   HPI Patient presents stating that he is developed a lot of pain in the outside of both feet over the last 6 weeks and that he did stop drinking a month ago and he wonders if that contributes to his issues   ROS      Objective:  Physical Exam  Neurovascular status intact with a lot of discomfort in the peroneal insertion into the base of the fifth metatarsal bilateral with no indication of muscle strength issues     Assessment:  Peroneal tendinitis right bilateral at the insertion base of fifth metatarsal     Plan:  H&P x-rays reviewed and today I did careful sheath injection bilateral 3 mg Kenalog 5 mg Xylocaine and advised on ice therapy and anti-inflammatories and patient will be seen back to recheck again  X-rays indicate that there is no indications of bone pathology with mild arthritis no stress fracture or fracture noted

## 2018-06-02 ENCOUNTER — Other Ambulatory Visit (HOSPITAL_COMMUNITY): Payer: Medicare HMO | Attending: Psychiatry | Admitting: Psychology

## 2018-06-02 DIAGNOSIS — J3089 Other allergic rhinitis: Secondary | ICD-10-CM | POA: Insufficient documentation

## 2018-06-02 DIAGNOSIS — F1020 Alcohol dependence, uncomplicated: Secondary | ICD-10-CM

## 2018-06-02 DIAGNOSIS — F1994 Other psychoactive substance use, unspecified with psychoactive substance-induced mood disorder: Secondary | ICD-10-CM

## 2018-06-02 DIAGNOSIS — R69 Illness, unspecified: Secondary | ICD-10-CM | POA: Diagnosis not present

## 2018-06-02 DIAGNOSIS — Z9189 Other specified personal risk factors, not elsewhere classified: Secondary | ICD-10-CM | POA: Insufficient documentation

## 2018-06-02 DIAGNOSIS — F341 Dysthymic disorder: Secondary | ICD-10-CM | POA: Diagnosis not present

## 2018-06-02 DIAGNOSIS — Z7982 Long term (current) use of aspirin: Secondary | ICD-10-CM | POA: Diagnosis not present

## 2018-06-02 DIAGNOSIS — H409 Unspecified glaucoma: Secondary | ICD-10-CM | POA: Diagnosis not present

## 2018-06-02 DIAGNOSIS — J449 Chronic obstructive pulmonary disease, unspecified: Secondary | ICD-10-CM | POA: Diagnosis not present

## 2018-06-02 DIAGNOSIS — Z91410 Personal history of adult physical and sexual abuse: Secondary | ICD-10-CM | POA: Diagnosis not present

## 2018-06-02 DIAGNOSIS — Z6372 Alcoholism and drug addiction in family: Secondary | ICD-10-CM | POA: Insufficient documentation

## 2018-06-02 DIAGNOSIS — R638 Other symptoms and signs concerning food and fluid intake: Secondary | ICD-10-CM | POA: Insufficient documentation

## 2018-06-02 DIAGNOSIS — Z79899 Other long term (current) drug therapy: Secondary | ICD-10-CM | POA: Diagnosis not present

## 2018-06-02 DIAGNOSIS — R7303 Prediabetes: Secondary | ICD-10-CM | POA: Insufficient documentation

## 2018-06-02 DIAGNOSIS — G4700 Insomnia, unspecified: Secondary | ICD-10-CM | POA: Insufficient documentation

## 2018-06-03 ENCOUNTER — Encounter (HOSPITAL_COMMUNITY): Payer: Self-pay | Admitting: Psychology

## 2018-06-03 NOTE — Progress Notes (Signed)
    Daily Group Progress Note  Program: CD-IOP   06/03/2018 Alan [name] [id number]  Diagnosis: Alcohol Use Disorder, Severe  Sobriety Date: 04/28/18  Group Time: 1-2:30pm  Participation Level: Active  Behavioral Response: Appropriate and Sharing  Type of Therapy: Process Group  Interventions: Supportive  Topic: Process: The first part of group began with process. Group members shared their experiences in early recovery since we last met on Wednesday. One group member was absent because she was attending a funeral. Two drug tests were collected.   Group Time: 2:30-4pm  Participation Level: Active  Behavioral Response: Appropriate  Type of Therapy: Psycho-education  Interventions: CBT  Topic: Psycho-ed: CBT Model; Correcting Irrational Thoughts: The second half of group was spent in psycho-ed discussing the Cognitive Behavioral Therapy model for correcting irrational thoughts. A worksheet was handed out explaining what irrational thoughts are and how they lead to negative emotions and behaviors. Group members then practiced the CBT model by finding alternative thoughts to their irrational thoughts.   Summary: The patient reported that he had attended one [place] meeting since we last met on Wednesday. A drug test was collected from the patient today because his previous drug test came back with insufficient urine. The patient attended an [place] meeting this morning where the topic was the serenity prayer. The patient picked up his 30-day chip this morning and reported feeling "accomplished." The patient reported to the group that he spoke with his sponsor this morning and will be starting the steps soon. The patient is "looking forward" to beginning step work. During the psycho ed portion of group, the patient struggled with the CBT activity at first, but with extra guidance was able to complete the activity. The patient chose to correct negative thoughts around a situation where his  girlfriend has invited extra people out to dinner with the couple when it was supposed to be just the two of them. The patient seemed to understand the benefit of this activity and its usefulness for long term recovery.    UDS collected: Yes, pending  AA/NA attended?: Yes, Thursday morning  Sponsor?: Yes   [name], LCAS 06/03/2018 5:40 PM

## 2018-06-04 ENCOUNTER — Other Ambulatory Visit (HOSPITAL_COMMUNITY): Payer: Medicare HMO | Admitting: Psychology

## 2018-06-04 DIAGNOSIS — R69 Illness, unspecified: Secondary | ICD-10-CM | POA: Diagnosis not present

## 2018-06-04 DIAGNOSIS — T7412XS Child physical abuse, confirmed, sequela: Secondary | ICD-10-CM

## 2018-06-04 DIAGNOSIS — J3089 Other allergic rhinitis: Secondary | ICD-10-CM | POA: Diagnosis not present

## 2018-06-04 DIAGNOSIS — Z811 Family history of alcohol abuse and dependence: Secondary | ICD-10-CM

## 2018-06-04 DIAGNOSIS — Z6372 Alcoholism and drug addiction in family: Secondary | ICD-10-CM | POA: Diagnosis not present

## 2018-06-04 DIAGNOSIS — Z9189 Other specified personal risk factors, not elsewhere classified: Secondary | ICD-10-CM | POA: Diagnosis not present

## 2018-06-04 DIAGNOSIS — F1994 Other psychoactive substance use, unspecified with psychoactive substance-induced mood disorder: Secondary | ICD-10-CM | POA: Diagnosis not present

## 2018-06-04 DIAGNOSIS — R7303 Prediabetes: Secondary | ICD-10-CM

## 2018-06-04 DIAGNOSIS — F1020 Alcohol dependence, uncomplicated: Secondary | ICD-10-CM | POA: Diagnosis not present

## 2018-06-04 DIAGNOSIS — F341 Dysthymic disorder: Secondary | ICD-10-CM

## 2018-06-04 DIAGNOSIS — R638 Other symptoms and signs concerning food and fluid intake: Secondary | ICD-10-CM | POA: Diagnosis not present

## 2018-06-04 DIAGNOSIS — J449 Chronic obstructive pulmonary disease, unspecified: Secondary | ICD-10-CM | POA: Diagnosis not present

## 2018-06-04 DIAGNOSIS — Z91410 Personal history of adult physical and sexual abuse: Secondary | ICD-10-CM | POA: Diagnosis not present

## 2018-06-04 DIAGNOSIS — H409 Unspecified glaucoma: Secondary | ICD-10-CM

## 2018-06-04 MED ORDER — BACLOFEN 10 MG PO TABS: 10.0000 mg | ORAL_TABLET | Freq: Three times a day (TID) | ORAL | 1 refills | Status: DC

## 2018-06-04 NOTE — Progress Notes (Signed)
Alan [name] is a 73 y.o. male patient.  CD IOP Individual Counseling Session  Second session from 4:00 pm to 5:00 pm. The patient reported, "things are going really good." The patient reported feeling grateful for his recovery and is enjoying his sober lifestyle. The patient did not report any depressive or anxious symptoms. No suicidal ideation was reported. The patient was fully oriented and engaged during the session.   The patient presented as calm and open during the counseling session. The patient appeared to experience a full range of affect in the session. The patient reported to the counselor intern the events that lead to to the death of his daughter. The patient also reported that events that lead to the divorce of each of his marriages. The patient appears to be motivated to stay sober by his desire to maintain close relationships.  The patient will continue to engage in activities that are supportive of his long-term recovery. The patient will also continue to include his long-term girlfriend in these activities. The counselor will reassess the patient each session for challenges preventing the patient from achieving his goal of regaining his passions. Maylon Peppers, Counselor Intern          [name], LCAS
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
## 2018-06-05 ENCOUNTER — Encounter (HOSPITAL_COMMUNITY): Payer: Self-pay | Admitting: Psychology

## 2018-06-05 ENCOUNTER — Other Ambulatory Visit (HOSPITAL_COMMUNITY): Payer: Medicare HMO | Admitting: Psychology

## 2018-06-05 DIAGNOSIS — R638 Other symptoms and signs concerning food and fluid intake: Secondary | ICD-10-CM | POA: Diagnosis not present

## 2018-06-05 DIAGNOSIS — R69 Illness, unspecified: Secondary | ICD-10-CM | POA: Diagnosis not present

## 2018-06-05 DIAGNOSIS — F1020 Alcohol dependence, uncomplicated: Secondary | ICD-10-CM

## 2018-06-05 DIAGNOSIS — J3089 Other allergic rhinitis: Secondary | ICD-10-CM | POA: Diagnosis not present

## 2018-06-05 DIAGNOSIS — F341 Dysthymic disorder: Secondary | ICD-10-CM | POA: Diagnosis not present

## 2018-06-05 DIAGNOSIS — Z91410 Personal history of adult physical and sexual abuse: Secondary | ICD-10-CM | POA: Diagnosis not present

## 2018-06-05 DIAGNOSIS — J449 Chronic obstructive pulmonary disease, unspecified: Secondary | ICD-10-CM | POA: Diagnosis not present

## 2018-06-05 DIAGNOSIS — Z9189 Other specified personal risk factors, not elsewhere classified: Secondary | ICD-10-CM | POA: Diagnosis not present

## 2018-06-05 DIAGNOSIS — Z6372 Alcoholism and drug addiction in family: Secondary | ICD-10-CM | POA: Diagnosis not present

## 2018-06-05 DIAGNOSIS — F1994 Other psychoactive substance use, unspecified with psychoactive substance-induced mood disorder: Secondary | ICD-10-CM | POA: Diagnosis not present

## 2018-06-05 DIAGNOSIS — R7303 Prediabetes: Secondary | ICD-10-CM | POA: Diagnosis not present

## 2018-06-05 NOTE — Progress Notes (Addendum)
    Daily Group Progress Note  Program: CD-IOP   06/10/2018 [name] [id number]  Diagnosis:  Alcohol use disorder, severe, dependence ([place])  Stage of Change: Action  Sobriety Date: 1/27  Group Time: 1-2:30  Participation Level: Active  Behavioral Response: Appropriate and Sharing  Type of Therapy: Process Group  Interventions: CBT  Topic: Process: The first part of group began with process. Group members shared their experiences in early recovery since we last met on Wednesday. Two group members were absent. One group member was on vacation while another group member got confused about the days of the week CD-IOP meets. No drug tests were collected.      Group Time: 2:30-4  Participation Level: Active  Behavioral Response: Appropriate and Sharing  Type of Therapy: Psycho-education Group  Interventions: CBT  Topic: Psycho-ed: Internal Triggers; The second part of group was spent in psycho-ed discussing internal triggers for use. Group members were encouraged to first discuss the difference between external and internal triggers. A handout was passed around with a list of emotional states. Group members were instructed to place a check by all the emotions that are internal triggers for using. Group members were instructed to then select their three most triggering emotional states. Group members processed ways of coping with these challenging emotional states with the group.    Summary: The patient reported that they had attended one [place] meeting since we last met on Wednesday. The patient reported that "nothing spectacular" has happened since we met yesterday. The patient shared that he went home and cooked dinner. The patient's girlfriend is out of town and the patient shared that he does not like sitting alone at the dinner and it "feels better" when someone else is there with him. During the psycho-ed portion of group, the patient shared that his biggest internal  triggers are feelings of worry, loneliness, and sadness.    UDS collected: No Results: negative  AA/NA attended?: YesThursday  Sponsor?: Yes  Alan [name], [facility], [place] 06/10/2018 3:16 PM

## 2018-06-05 NOTE — Progress Notes (Signed)
    Daily Group Progress Note  Program: CD-IOP   06/05/2018 WES LEZOTTE [id number]  Diagnosis:  Alcohol use disorder, severe, dependence ([place])  Substance induced mood disorder ([place])  Stage of Change: Action  Sobriety Date: 1/27  Group Time: 1-2:30  Participation Level: Active  Behavioral Response: Appropriate  Type of Therapy: Process Group  Interventions: CBT and Supportive  Topic: PTs were active and engaged in group processing session. Counselor facilitated CBT-based and emotion-focused processing questions to help PTs discuss their recovery from mind-altering drugs/alcohol. Emphasis was placed on PTs disclosing their challenges and successes pertaining to their treatment plan goals.      Group Time: 2:30-4  Participation Level: Active  Behavioral Response: Appropriate and Sharing  Type of Therapy: Psycho-education Group  Interventions: Meditation: Chair Yoga  Topic: Patients were led in a 1 hour chair yoga session. Pts were guided in various positions that can be adapted for relaxation at work, home, and on the go.    Summary: PT was open, sharing, though quiet in session. He participated actively in chair yoga and stated it was relaxing. PT went to 1 AA meeting this morning. He attended a church fundraiser over weeekend. PT shared his S/O was going to the beach this week though he was "not nervous about being alone".    UDS collected: No Results: most recent 3/2 negative  AA/NA attended?: YesMonday  Sponsor?: Yes   Youlanda Roys, [facility], LCASA 06/05/2018 6:30 PM

## 2018-06-06 ENCOUNTER — Other Ambulatory Visit (HOSPITAL_COMMUNITY): Payer: Self-pay | Admitting: Medical

## 2018-06-09 ENCOUNTER — Other Ambulatory Visit (HOSPITAL_COMMUNITY): Payer: Medicare HMO | Admitting: Psychology

## 2018-06-09 ENCOUNTER — Encounter (HOSPITAL_COMMUNITY): Payer: Self-pay | Admitting: Medical

## 2018-06-09 DIAGNOSIS — J3089 Other allergic rhinitis: Secondary | ICD-10-CM | POA: Diagnosis not present

## 2018-06-09 DIAGNOSIS — F341 Dysthymic disorder: Secondary | ICD-10-CM | POA: Diagnosis not present

## 2018-06-09 DIAGNOSIS — J449 Chronic obstructive pulmonary disease, unspecified: Secondary | ICD-10-CM | POA: Diagnosis not present

## 2018-06-09 DIAGNOSIS — Z6372 Alcoholism and drug addiction in family: Secondary | ICD-10-CM | POA: Diagnosis not present

## 2018-06-09 DIAGNOSIS — Z9189 Other specified personal risk factors, not elsewhere classified: Secondary | ICD-10-CM | POA: Diagnosis not present

## 2018-06-09 DIAGNOSIS — Z91410 Personal history of adult physical and sexual abuse: Secondary | ICD-10-CM | POA: Diagnosis not present

## 2018-06-09 DIAGNOSIS — F1020 Alcohol dependence, uncomplicated: Secondary | ICD-10-CM | POA: Diagnosis not present

## 2018-06-09 DIAGNOSIS — R69 Illness, unspecified: Secondary | ICD-10-CM | POA: Diagnosis not present

## 2018-06-09 DIAGNOSIS — R7303 Prediabetes: Secondary | ICD-10-CM | POA: Diagnosis not present

## 2018-06-09 DIAGNOSIS — F1994 Other psychoactive substance use, unspecified with psychoactive substance-induced mood disorder: Secondary | ICD-10-CM | POA: Diagnosis not present

## 2018-06-09 DIAGNOSIS — R638 Other symptoms and signs concerning food and fluid intake: Secondary | ICD-10-CM | POA: Diagnosis not present
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
## 2018-06-11 ENCOUNTER — Other Ambulatory Visit: Payer: Self-pay

## 2018-06-11 ENCOUNTER — Encounter (HOSPITAL_COMMUNITY): Payer: Self-pay | Admitting: Psychology

## 2018-06-11 ENCOUNTER — Other Ambulatory Visit (HOSPITAL_COMMUNITY): Payer: Medicare HMO | Admitting: Psychology

## 2018-06-11 DIAGNOSIS — F1020 Alcohol dependence, uncomplicated: Secondary | ICD-10-CM

## 2018-06-11 DIAGNOSIS — Z9189 Other specified personal risk factors, not elsewhere classified: Secondary | ICD-10-CM | POA: Diagnosis not present

## 2018-06-11 DIAGNOSIS — F1994 Other psychoactive substance use, unspecified with psychoactive substance-induced mood disorder: Secondary | ICD-10-CM | POA: Diagnosis not present

## 2018-06-11 DIAGNOSIS — Z91410 Personal history of adult physical and sexual abuse: Secondary | ICD-10-CM | POA: Diagnosis not present

## 2018-06-11 DIAGNOSIS — R69 Illness, unspecified: Secondary | ICD-10-CM | POA: Diagnosis not present

## 2018-06-11 DIAGNOSIS — Z6372 Alcoholism and drug addiction in family: Secondary | ICD-10-CM | POA: Diagnosis not present

## 2018-06-11 DIAGNOSIS — F341 Dysthymic disorder: Secondary | ICD-10-CM | POA: Diagnosis not present

## 2018-06-11 DIAGNOSIS — R638 Other symptoms and signs concerning food and fluid intake: Secondary | ICD-10-CM | POA: Diagnosis not present

## 2018-06-11 DIAGNOSIS — J3089 Other allergic rhinitis: Secondary | ICD-10-CM | POA: Diagnosis not present

## 2018-06-11 DIAGNOSIS — R7303 Prediabetes: Secondary | ICD-10-CM | POA: Diagnosis not present

## 2018-06-11 DIAGNOSIS — J449 Chronic obstructive pulmonary disease, unspecified: Secondary | ICD-10-CM | POA: Diagnosis not present

## 2018-06-11 NOTE — Progress Notes (Signed)
Daily Group Progress Note  Program: CD-IOP   06/11/2018 Gerber D Szczesny [id number]  Diagnosis:  Alcohol use disorder, severe, dependence (HCC)  Stage of Change: Action  Sobriety Date: 1/27  Group Time: 1-2:30  Participation Level: Active  Behavioral Response: Appropriate and Sharing  Type of Therapy: Process Group  Interventions: CBT  Topic: PTs were active and engaged in group processing session. Counselor facilitated CBT-based and emotion-focused processing questions to help PTs discuss their recovery from mind-altering drugs/alcohol. Emphasis was placed on PTs disclosing their challenges and successes pertaining to their treatment plan goals. Multiple members were out due to sickness. One new member started group and met w/ medical director to establish care. UDS samples were collected from 4 members.       Group Time: 2:30-4  Participation Level: Active  Behavioral Response: Appropriate and Sharing  Type of Therapy: Process Group  Interventions: CBT  Topic: Patients were led in a 1 hour psychoeducation session on how to utilize a breathing technique to calm anxiety/cravings. PTs were instructed to use acronym SOBER; Stop, Observe, Breathe, Expand, Respond. PTs were taught how to use mindfulness to respond to their discomfort, stress. A handout was provided for PTs to read through and take home to practice.     Summary: PT was active, engaged, upbeat, and well groomed for session. He reported he had a good weekend and was happy that his S/O is back from the beach. PT continues to verbalize that he is "done w/ drinking". Counselor confronted this and educated PT on the importance of living "just for today" since the future cannot be predicted. PT shared that he got new laminate floors installed over the weekend and his house feels clean and updated.    UDS collected: No Results: negative  AA/NA attended?: YesFriday and Saturday  Sponsor?: Yes   Wes Swan,  LCMHCA LCASA 06/11/2018 9:33 AM 

## 2018-06-12 ENCOUNTER — Other Ambulatory Visit (HOSPITAL_COMMUNITY): Payer: Medicare HMO | Admitting: Psychology

## 2018-06-12 ENCOUNTER — Encounter (HOSPITAL_COMMUNITY): Payer: Self-pay | Admitting: Psychology

## 2018-06-12 ENCOUNTER — Other Ambulatory Visit: Payer: Self-pay

## 2018-06-12 DIAGNOSIS — J3089 Other allergic rhinitis: Secondary | ICD-10-CM | POA: Diagnosis not present

## 2018-06-12 DIAGNOSIS — R69 Illness, unspecified: Secondary | ICD-10-CM | POA: Diagnosis not present

## 2018-06-12 DIAGNOSIS — Z91410 Personal history of adult physical and sexual abuse: Secondary | ICD-10-CM | POA: Diagnosis not present

## 2018-06-12 DIAGNOSIS — F1020 Alcohol dependence, uncomplicated: Secondary | ICD-10-CM

## 2018-06-12 DIAGNOSIS — Z9189 Other specified personal risk factors, not elsewhere classified: Secondary | ICD-10-CM | POA: Diagnosis not present

## 2018-06-12 DIAGNOSIS — F1994 Other psychoactive substance use, unspecified with psychoactive substance-induced mood disorder: Secondary | ICD-10-CM | POA: Diagnosis not present

## 2018-06-12 DIAGNOSIS — R7303 Prediabetes: Secondary | ICD-10-CM | POA: Diagnosis not present

## 2018-06-12 DIAGNOSIS — J449 Chronic obstructive pulmonary disease, unspecified: Secondary | ICD-10-CM | POA: Diagnosis not present

## 2018-06-12 DIAGNOSIS — R638 Other symptoms and signs concerning food and fluid intake: Secondary | ICD-10-CM | POA: Diagnosis not present

## 2018-06-12 DIAGNOSIS — F341 Dysthymic disorder: Secondary | ICD-10-CM | POA: Diagnosis not present

## 2018-06-12 DIAGNOSIS — Z6372 Alcoholism and drug addiction in family: Secondary | ICD-10-CM | POA: Diagnosis not present

## 2018-06-14 ENCOUNTER — Encounter (HOSPITAL_COMMUNITY): Payer: Self-pay | Admitting: Psychology

## 2018-06-14 NOTE — Progress Notes (Signed)
    Daily Group Progress Note  Program: CD-IOP   06/12/2018 Alan [name] [id number]  Diagnosis: Alcohol Use Disorder, Severe  Sobriety Date: 04/28/18  Group Time: 1-2:30pm  Participation Level: Active  Behavioral Response: Sharing  Type of Therapy: Process Group  Interventions: Supportive  Topic: Process: The first part of group began with process. Group members shared their experiences in early recovery since we last met on Wednesday. Group members participated in a popsicle stick check-in activity. One group member was absent due to being sick. No drug tests were collected.   Group Time: 2:30-4pm  Participation Level: Minimal  Behavioral Response: Appropriate  Type of Therapy: Psycho-Education  Interventions: CBT  Topic: Psycho-ed: Self Compassion & Graduation; The second half of group was spent in psycho-ed watching the Ted Talk by [name] on self-compassion. Group members watched the video together and processed their reactions afterwards. During the latter part of psycho-ed, a graduation ceremony was held for a group member who successfully completed CD-IOP.  Summary: The patient reported that he attended one [place] meeting since we last met. The patient shared he recognized some AA members in his church congregation. The patient processed feelings of happiness for seeing AA members outside of the rooms. The patient shared that he and his girlfriend met with a counselor this morning. The patient shared that the meeting "went well." The patient's popsicle stick word was "grief." The patient processed the many deaths that have occurred in his church the last few weeks and how he handles grief. During the psycho-ed portion of group, the patient attentively watched the Ted Talk video and participated in the group discussion afterwards.    UDS collected: No  AA/NA attended?: Yes, Thursday morning  Sponsor?: Yes   [name], LCAS 06/14/2018 5:20 PM

## 2018-06-14 NOTE — Progress Notes (Signed)
    Daily Group Progress Note  Program: CD-IOP   06/14/2018 HAWKE VILLALPANDO [id number]  Diagnosis:  Alcohol use disorder, severe, dependence ([place])  Stage of Change: Action  Sobriety Date: 1/27  Group Time: 1-2:30  Participation Level: Active  Behavioral Response: Appropriate and Sharing  Type of Therapy: Process Group  Interventions: CBT  Topic: PTs were active and engaged in group processing session. Counselor facilitated CBT-based and emotion-focused processing questions to help PTs discuss their recovery from mind-altering drugs/alcohol. Emphasis was placed on PTs disclosing their challenges and successes pertaining to their treatment plan goals. One member continues to be out due to sickness. One member returned to group after missing for a presceduled trip. Some members met w/ Darlyne Russian, medical director for medication management.       Group Time: 2:30-4  Participation Level: Active  Behavioral Response: Appropriate and Sharing  Type of Therapy: Psycho-education Group  Interventions: Other: Spirituality in recovery discussion  Topic: Patients were led in a 1 hour psychoeducation session on spirituality in recovery. Counselor distinguished differences b/w spirituality and religion. PTs were provided a handout w/ the Serenity Prayer and asked to discuss the meaning of the prayer. PTs were asked to disclose 5 things they can change, 5 things they cannot change.     Summary: PT was active, engaged, upbeat. Continues to report staying sober is "easy". He attended 2 AA meetings since last group. PT continues to report to group on his progress w/ his sponsor and his insights gained in [employer]. PT reported he is unable to control his "past" and his "family members".   UDS collected: Yes Results: From 3/11 negative  AA/NA attended?: YesTuesday and Wednesday  Sponsor?: Yes   Youlanda Roys, [facility], [place] 06/14/2018 11:05 AM

## 2018-06-15 ENCOUNTER — Other Ambulatory Visit: Payer: Self-pay | Admitting: Primary Care

## 2018-06-16 ENCOUNTER — Encounter (HOSPITAL_COMMUNITY): Payer: Self-pay | Admitting: Medical

## 2018-06-16 ENCOUNTER — Other Ambulatory Visit (INDEPENDENT_AMBULATORY_CARE_PROVIDER_SITE_OTHER): Payer: Medicare HMO | Admitting: Medical

## 2018-06-16 ENCOUNTER — Other Ambulatory Visit: Payer: Self-pay

## 2018-06-16 DIAGNOSIS — Z811 Family history of alcohol abuse and dependence: Secondary | ICD-10-CM

## 2018-06-16 DIAGNOSIS — Z6372 Alcoholism and drug addiction in family: Secondary | ICD-10-CM

## 2018-06-16 DIAGNOSIS — R638 Other symptoms and signs concerning food and fluid intake: Secondary | ICD-10-CM | POA: Diagnosis not present

## 2018-06-16 DIAGNOSIS — F1020 Alcohol dependence, uncomplicated: Secondary | ICD-10-CM

## 2018-06-16 DIAGNOSIS — Z9189 Other specified personal risk factors, not elsewhere classified: Secondary | ICD-10-CM | POA: Diagnosis not present

## 2018-06-16 DIAGNOSIS — T7412XS Child physical abuse, confirmed, sequela: Secondary | ICD-10-CM

## 2018-06-16 DIAGNOSIS — F1994 Other psychoactive substance use, unspecified with psychoactive substance-induced mood disorder: Secondary | ICD-10-CM

## 2018-06-16 DIAGNOSIS — J449 Chronic obstructive pulmonary disease, unspecified: Secondary | ICD-10-CM

## 2018-06-16 DIAGNOSIS — Z91410 Personal history of adult physical and sexual abuse: Secondary | ICD-10-CM | POA: Diagnosis not present

## 2018-06-16 DIAGNOSIS — R7303 Prediabetes: Secondary | ICD-10-CM

## 2018-06-16 DIAGNOSIS — F341 Dysthymic disorder: Secondary | ICD-10-CM

## 2018-06-16 DIAGNOSIS — J3089 Other allergic rhinitis: Secondary | ICD-10-CM | POA: Diagnosis not present

## 2018-06-16 DIAGNOSIS — R69 Illness, unspecified: Secondary | ICD-10-CM | POA: Diagnosis not present

## 2018-06-16 DIAGNOSIS — H409 Unspecified glaucoma: Secondary | ICD-10-CM
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
## 2018-06-18 ENCOUNTER — Other Ambulatory Visit: Payer: Self-pay

## 2018-06-18 ENCOUNTER — Other Ambulatory Visit (HOSPITAL_COMMUNITY): Payer: Medicare HMO | Admitting: Psychology

## 2018-06-18 DIAGNOSIS — Z811 Family history of alcohol abuse and dependence: Secondary | ICD-10-CM

## 2018-06-18 DIAGNOSIS — Z9189 Other specified personal risk factors, not elsewhere classified: Secondary | ICD-10-CM

## 2018-06-18 DIAGNOSIS — Z6372 Alcoholism and drug addiction in family: Secondary | ICD-10-CM

## 2018-06-18 DIAGNOSIS — F1020 Alcohol dependence, uncomplicated: Secondary | ICD-10-CM

## 2018-06-18 NOTE — Progress Notes (Signed)
Alan [name] is a 73 y.o. male patient.  CD IOP Family Counseling Session  Family session from 11:30 am - 12:30 am  with the patient and his girlfriend of 17 years. The patient reported, "things are going really good." The patient did not report any depressive or anxious symptoms. The patient was fully oriented and engaged during the session.   The patient presented as calm and open during the family session. The patient appeared to experience a full range of affect in the session. The patient and his girlfriend processed the patient's recovery journey. The counselor used psycho-ed to prepare the patient's girlfriend on how to help the patient if he discloses with her that he is having a craving.     The patient will continue to engage in activities that are supportive of his long-term recovery. The patient will also continue to include his long-term girlfriend in these activities.  The patient's expected graduation date is March 18th.  Maylon Peppers, Counselor Intern         [name], LCAS

## 2018-06-19 ENCOUNTER — Other Ambulatory Visit (HOSPITAL_COMMUNITY): Payer: Medicare HMO
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
## 2018-06-23 ENCOUNTER — Other Ambulatory Visit (HOSPITAL_COMMUNITY): Payer: Medicare HMO

## 2018-06-24 ENCOUNTER — Encounter (HOSPITAL_COMMUNITY): Payer: Self-pay | Admitting: Psychology
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
## 2018-06-25 ENCOUNTER — Other Ambulatory Visit (HOSPITAL_COMMUNITY): Payer: Medicare HMO

## 2018-06-26 ENCOUNTER — Other Ambulatory Visit (HOSPITAL_COMMUNITY): Payer: Medicare HMO

## 2018-06-30 ENCOUNTER — Other Ambulatory Visit (HOSPITAL_COMMUNITY): Payer: Medicare HMO

## 2018-07-02 ENCOUNTER — Other Ambulatory Visit (HOSPITAL_COMMUNITY): Payer: Medicare HMO

## 2018-07-03 ENCOUNTER — Other Ambulatory Visit (HOSPITAL_COMMUNITY): Payer: Medicare HMO

## 2018-07-07 ENCOUNTER — Other Ambulatory Visit (HOSPITAL_COMMUNITY): Payer: Medicare HMO

## 2018-07-09 ENCOUNTER — Other Ambulatory Visit (HOSPITAL_COMMUNITY): Payer: Medicare HMO

## 2018-07-10 ENCOUNTER — Other Ambulatory Visit (HOSPITAL_COMMUNITY): Payer: Medicare HMO

## 2018-07-14 ENCOUNTER — Other Ambulatory Visit (HOSPITAL_COMMUNITY): Payer: Medicare HMO

## 2018-07-16 ENCOUNTER — Other Ambulatory Visit (HOSPITAL_COMMUNITY): Payer: Medicare HMO

## 2018-07-17 ENCOUNTER — Other Ambulatory Visit (HOSPITAL_COMMUNITY): Payer: Medicare HMO

## 2018-07-21 ENCOUNTER — Other Ambulatory Visit (HOSPITAL_COMMUNITY): Payer: Medicare HMO

## 2018-07-23 ENCOUNTER — Other Ambulatory Visit (HOSPITAL_COMMUNITY): Payer: Medicare HMO

## 2018-07-31 ENCOUNTER — Other Ambulatory Visit: Payer: Self-pay | Admitting: Primary Care

## 2018-07-31 DIAGNOSIS — R059 Cough, unspecified: Secondary | ICD-10-CM

## 2018-07-31 DIAGNOSIS — R05 Cough: Secondary | ICD-10-CM

## 2018-09-10 ENCOUNTER — Encounter: Payer: Self-pay | Admitting: Primary Care

## 2018-09-10 DIAGNOSIS — D225 Melanocytic nevi of trunk: Secondary | ICD-10-CM | POA: Diagnosis not present

## 2018-09-10 DIAGNOSIS — L989 Disorder of the skin and subcutaneous tissue, unspecified: Secondary | ICD-10-CM | POA: Diagnosis not present

## 2018-09-10 DIAGNOSIS — L820 Inflamed seborrheic keratosis: Secondary | ICD-10-CM | POA: Diagnosis not present

## 2018-09-10 DIAGNOSIS — L814 Other melanin hyperpigmentation: Secondary | ICD-10-CM | POA: Diagnosis not present

## 2018-09-10 DIAGNOSIS — L281 Prurigo nodularis: Secondary | ICD-10-CM | POA: Diagnosis not present

## 2018-09-10 DIAGNOSIS — L821 Other seborrheic keratosis: Secondary | ICD-10-CM | POA: Diagnosis not present

## 2018-09-10 DIAGNOSIS — L570 Actinic keratosis: Secondary | ICD-10-CM | POA: Diagnosis not present

## 2018-09-10 DIAGNOSIS — Z85828 Personal history of other malignant neoplasm of skin: Secondary | ICD-10-CM | POA: Diagnosis not present

## 2018-09-10 DIAGNOSIS — D229 Melanocytic nevi, unspecified: Secondary | ICD-10-CM | POA: Diagnosis not present

## 2018-09-13 ENCOUNTER — Other Ambulatory Visit: Payer: Self-pay | Admitting: Primary Care

## 2018-09-13 DIAGNOSIS — I10 Essential (primary) hypertension: Secondary | ICD-10-CM

## 2018-09-13 DIAGNOSIS — F329 Major depressive disorder, single episode, unspecified: Secondary | ICD-10-CM

## 2018-09-13 DIAGNOSIS — E782 Mixed hyperlipidemia: Secondary | ICD-10-CM

## 2018-09-13 DIAGNOSIS — F32A Depression, unspecified: Secondary | ICD-10-CM

## 2018-10-23 ENCOUNTER — Telehealth: Payer: Self-pay | Admitting: Primary Care

## 2018-10-23 NOTE — Telephone Encounter (Signed)
lvm asking patient to call office. Need to discuss making 7/30 appt a lab appt only. Patient will need to get ppw at this visit to fill out and bring back for visit with [name].

## 2018-10-30 ENCOUNTER — Other Ambulatory Visit: Payer: Self-pay

## 2018-10-30 ENCOUNTER — Other Ambulatory Visit: Payer: Self-pay | Admitting: Primary Care

## 2018-10-30 ENCOUNTER — Other Ambulatory Visit (INDEPENDENT_AMBULATORY_CARE_PROVIDER_SITE_OTHER): Payer: Medicare HMO

## 2018-10-30 DIAGNOSIS — R7303 Prediabetes: Secondary | ICD-10-CM | POA: Diagnosis not present

## 2018-10-30 DIAGNOSIS — E785 Hyperlipidemia, unspecified: Secondary | ICD-10-CM | POA: Diagnosis not present

## 2018-10-30 DIAGNOSIS — I10 Essential (primary) hypertension: Secondary | ICD-10-CM

## 2018-10-30 LAB — COMPREHENSIVE METABOLIC PANEL
ALT: 25 U/L (ref 0–53)
AST: 16 U/L (ref 0–37)
Albumin: 4.4 g/dL (ref 3.5–5.2)
Alkaline Phosphatase: 69 U/L (ref 39–117)
BUN: 17 mg/dL (ref 6–23)
CO2: 31 mEq/L (ref 19–32)
Calcium: 9.8 mg/dL (ref 8.4–10.5)
Chloride: 103 mEq/L (ref 96–112)
Creatinine, Ser: 0.75 mg/dL (ref 0.40–1.50)
GFR: 102.28 mL/min (ref >60.00)
Glucose, Bld: 128 mg/dL — ABNORMAL HIGH (ref 70–99)
Potassium: 4.8 mEq/L (ref 3.5–5.1)
Sodium: 142 mEq/L (ref 135–145)
Total Bilirubin: 0.5 mg/dL (ref 0.2–1.2)
Total Protein: 6.9 g/dL (ref 6.0–8.3)

## 2018-10-30 LAB — LIPID PANEL
Cholesterol: 141 mg/dL (ref 0–200)
HDL: 52.1 mg/dL (ref >39.00)
LDL Cholesterol: 58 mg/dL (ref 0–99)
NonHDL: 88.96
Total CHOL/HDL Ratio: 3
Triglycerides: 154 mg/dL — ABNORMAL HIGH (ref 0.0–149.0)
VLDL: 30.8 mg/dL (ref 0.0–40.0)

## 2018-10-30 LAB — HEMOGLOBIN A1C: Hgb A1c MFr Bld: 6.3 % (ref 4.6–6.5)

## 2018-10-31 ENCOUNTER — Encounter: Payer: Self-pay | Admitting: Primary Care

## 2018-10-31 ENCOUNTER — Other Ambulatory Visit: Payer: Self-pay

## 2018-10-31 ENCOUNTER — Telehealth: Payer: Self-pay

## 2018-10-31 ENCOUNTER — Ambulatory Visit (INDEPENDENT_AMBULATORY_CARE_PROVIDER_SITE_OTHER): Payer: Medicare HMO | Admitting: Primary Care

## 2018-10-31 VITALS — BP 124/80 | HR 63 | Temp 98.0°F | Ht 72.0 in | Wt 270.5 lb

## 2018-10-31 DIAGNOSIS — R053 Chronic cough: Secondary | ICD-10-CM

## 2018-10-31 DIAGNOSIS — R7303 Prediabetes: Secondary | ICD-10-CM

## 2018-10-31 DIAGNOSIS — K219 Gastro-esophageal reflux disease without esophagitis: Secondary | ICD-10-CM

## 2018-10-31 DIAGNOSIS — I10 Essential (primary) hypertension: Secondary | ICD-10-CM | POA: Diagnosis not present

## 2018-10-31 DIAGNOSIS — R05 Cough: Secondary | ICD-10-CM | POA: Diagnosis not present

## 2018-10-31 DIAGNOSIS — E785 Hyperlipidemia, unspecified: Secondary | ICD-10-CM | POA: Diagnosis not present

## 2018-10-31 DIAGNOSIS — H409 Unspecified glaucoma: Secondary | ICD-10-CM

## 2018-10-31 DIAGNOSIS — F3342 Major depressive disorder, recurrent, in full remission: Secondary | ICD-10-CM

## 2018-10-31 DIAGNOSIS — M25561 Pain in right knee: Secondary | ICD-10-CM | POA: Diagnosis not present

## 2018-10-31 DIAGNOSIS — R69 Illness, unspecified: Secondary | ICD-10-CM | POA: Diagnosis not present

## 2018-10-31 DIAGNOSIS — F1010 Alcohol abuse, uncomplicated: Secondary | ICD-10-CM

## 2018-10-31 DIAGNOSIS — Z0000 Encounter for general adult medical examination without abnormal findings: Secondary | ICD-10-CM

## 2018-10-31 DIAGNOSIS — J449 Chronic obstructive pulmonary disease, unspecified: Secondary | ICD-10-CM | POA: Diagnosis not present

## 2018-10-31 DIAGNOSIS — G8929 Other chronic pain: Secondary | ICD-10-CM

## 2018-10-31 DIAGNOSIS — M25562 Pain in left knee: Secondary | ICD-10-CM

## 2018-10-31 NOTE — Assessment & Plan Note (Signed)
Denies concerns, seems to be doing well. Continue omeprazole daily.

## 2018-10-31 NOTE — Assessment & Plan Note (Signed)
Mostly resolved except for seasonal changes. He will resume Symbicort now due to return in cough. Continue PPI and Zyrtec.

## 2018-10-31 NOTE — Assessment & Plan Note (Signed)
Stable in the office today, BMP unremarkable. Continue amlodipine and losartan.

## 2018-10-31 NOTE — Assessment & Plan Note (Signed)
Underwent treatment per Dr. Harold Hedge. Doing well, has been sober for 6 months, commended him on this!

## 2018-10-31 NOTE — Patient Instructions (Signed)
Start exercising. You should be getting 150 minutes of exercise weekly.  It's important to eat a healthy diet by reducing consumption of fast food, fried food, processed snack foods, sugary drinks. Increase consumption of fresh vegetables and fruits, whole grains, water.  Ensure you are drinking 64 ounces of water daily.  Congratulations on your sobriety!  Schedule a lab only appointment for 6 months to repeat the diabetes test.  It was a pleasure to see you today!   Preventive Care 72 Years and Older, Male Preventive care refers to lifestyle choices and visits with your health care provider that can promote health and wellness. This includes:  A yearly physical exam. This is also called an annual well check.  Regular dental and eye exams.  Immunizations.  Screening for certain conditions.  Healthy lifestyle choices, such as diet and exercise. What can I expect for my preventive care visit? Physical exam Your health care provider will check:  Height and weight. These may be used to calculate body mass index (BMI), which is a measurement that tells if you are at a healthy weight.  Heart rate and blood pressure.  Your skin for abnormal spots. Counseling Your health care provider may ask you questions about:  Alcohol, tobacco, and drug use.  Emotional well-being.  Home and relationship well-being.  Sexual activity.  Eating habits.  History of falls.  Memory and ability to understand (cognition).  Work and work Statistician. What immunizations do I need?  Influenza (flu) vaccine  This is recommended every year. Tetanus, diphtheria, and pertussis (Tdap) vaccine  You may need a Td booster every 10 years. Varicella (chickenpox) vaccine  You may need this vaccine if you have not already been vaccinated. Zoster (shingles) vaccine  You may need this after age 31. Pneumococcal conjugate (PCV13) vaccine  One dose is recommended after age 109. Pneumococcal  polysaccharide (PPSV23) vaccine  One dose is recommended after age 40. Measles, mumps, and rubella (MMR) vaccine  You may need at least one dose of MMR if you were born in 1957 or later. You may also need a second dose. Meningococcal conjugate (MenACWY) vaccine  You may need this if you have certain conditions. Hepatitis A vaccine  You may need this if you have certain conditions or if you travel or work in places where you may be exposed to hepatitis A. Hepatitis B vaccine  You may need this if you have certain conditions or if you travel or work in places where you may be exposed to hepatitis B. Haemophilus influenzae type b (Hib) vaccine  You may need this if you have certain conditions. You may receive vaccines as individual doses or as more than one vaccine together in one shot (combination vaccines). Talk with your health care provider about the risks and benefits of combination vaccines. What tests do I need? Blood tests  Lipid and cholesterol levels. These may be checked every 5 years, or more frequently depending on your overall health.  Hepatitis C test.  Hepatitis B test. Screening  Lung cancer screening. You may have this screening every year starting at age 57 if you have a 30-pack-year history of smoking and currently smoke or have quit within the past 15 years.  Colorectal cancer screening. All adults should have this screening starting at age 24 and continuing until age 66. Your health care provider may recommend screening at age 104 if you are at increased risk. You will have tests every 1-10 years, depending on your results and the  type of screening test.  Prostate cancer screening. Recommendations will vary depending on your family history and other risks.  Diabetes screening. This is done by checking your blood sugar (glucose) after you have not eaten for a while (fasting). You may have this done every 1-3 years.  Abdominal aortic aneurysm (AAA) screening. You  may need this if you are a current or former smoker.  Sexually transmitted disease (STD) testing. Follow these instructions at home: Eating and drinking  Eat a diet that includes fresh fruits and vegetables, whole grains, lean protein, and low-fat dairy products. Limit your intake of foods with high amounts of sugar, saturated fats, and salt.  Take vitamin and mineral supplements as recommended by your health care provider.  Do not drink alcohol if your health care provider tells you not to drink.  If you drink alcohol: ? Limit how much you have to 0-2 drinks a day. ? Be aware of how much alcohol is in your drink. In the [place] one drink equals one 12 oz bottle of beer (355 mL), one 5 oz glass of wine (148 mL), or one 1 oz glass of hard liquor (44 mL). Lifestyle  Take daily care of your teeth and gums.  Stay active. Exercise for at least 30 minutes on 5 or more days each week.  Do not use any products that contain nicotine or tobacco, such as cigarettes, e-cigarettes, and chewing tobacco. If you need help quitting, ask your health care provider.  If you are sexually active, practice safe sex. Use a condom or other form of protection to prevent STIs (sexually transmitted infections).  Talk with your health care provider about taking a low-dose aspirin or statin. What's next?  Visit your health care provider once a year for a well check visit.  Ask your health care provider how often you should have your eyes and teeth checked.  Stay up to date on all vaccines. This information is not intended to replace advice given to you by your health care provider. Make sure you discuss any questions you have with your health care provider. Document Released: 04/15/2015 Document Revised: 03/13/2018 Document Reviewed: 03/13/2018 Elsevier Patient Education  2020 [employer].

## 2018-10-31 NOTE — Assessment & Plan Note (Signed)
Not using either inhaler typically, recently began to use Symbicort due to return in cough.   Discussed to use Symbicort daily, use albuterol only PRN. No cough or wheezing on exam. He will update.

## 2018-10-31 NOTE — Assessment & Plan Note (Signed)
Immunizations UTD. PSA UTD. Colonoscopy UTD. Discussed the importance of a healthy diet and regular exercise in order for weight loss, and to reduce the risk of any potential medical problems. Exam unremarkable, no changes. Labs reviewed.

## 2018-10-31 NOTE — Assessment & Plan Note (Signed)
History of bilateral knee replacements. Will be seeing orthopedics for evaluation on 11/11/2018. Encouraged weight loss.

## 2018-10-31 NOTE — Progress Notes (Signed)
Subjective:    [name], male    DOB: 26-Jan-1946, 73 y.o.   MRN: [id number]  HPI  Alan [name] is a 73 year old male who presents today for complete physical.  Immunizations: -Tetanus: Completed in 2018 -Influenza: Due this season  -Pneumonia: Completed last in 2016 -Shingles: Completed in 2008  Diet: He endorses a healthy diet. He is eating plenty of vegetables, fruit, home cooked meals. Desserts daily. Drinking diet drinks, water. He's been without alcohol for 6 months. Exercise: He is active, no regular exercise.  Eye exam: Due, no recent exam. Dental exam: Due, needs to reschedule Colonoscopy: Completed in 2019 PSA: Completed in 2019, 1.52 Hep C Screen: Negative in 2017  BP Readings from Last 3 Encounters:  10/31/18 124/80  04/08/18 126/82  03/28/18 136/80     Review of Systems  Constitutional: Negative for unexpected weight change.  HENT: Negative for rhinorrhea.   Respiratory: Negative for cough and shortness of breath.   Cardiovascular: Negative for chest pain.  Gastrointestinal: Negative for constipation and diarrhea.  Genitourinary: Negative for difficulty urinating.  Musculoskeletal: Positive for arthralgias.       Chronic bilateral knee pain, increased  Skin: Negative for rash.  Allergic/Immunologic: Negative for environmental allergies.  Neurological: Negative for dizziness, numbness and headaches.  Psychiatric/Behavioral: The patient is not nervous/anxious.        Doing well on Zoloft, denies SI/HI. Sober for 6 months.        Past Medical History:  Diagnosis Date  . Allergic rhinitis, cause unspecified   . Allergy   . Cancer (HCC)    Basal cell skin cancer,left ear  . Carpal tunnel syndrome   . Chronic airway obstruction, not elsewhere classified   . Esophageal reflux   . Glaucoma (increased eye pressure)   . Lipoma of unspecified site   . Osteoarthrosis, unspecified whether generalized or localized, lower leg   . Other and  unspecified hyperlipidemia   . Personal history of colonic polyps   . Personal history of other malignant neoplasm of skin   . Unspecified essential hypertension      Social History   Socioeconomic History  . Marital status: Divorced    Spouse name: Not on file  . Number of children: 2  . Years of education: Not on file  . Highest education level: Not on file  Occupational History  . 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 Financial resource strain: Not on file  . Food insecurity    Worry: Not on file    Inability: Not on file  . Transportation needs    Medical: Not on file    Non-medical: Not on file  Tobacco Use  . Smoking status: Former Smoker    Packs/day: 3.00    Years: 25.00    Pack years: 75.00    Quit date: 11/23/1996    Years since quitting: 21.9  . Smokeless tobacco: Never Used  Substance and Sexual Activity  . Alcohol use: Yes    Alcohol/week: 4.0 standard drinks    Types: 4 Standard drinks or equivalent per week    Comment: 4 mixed drinks per week  . Drug use: No  . Sexual activity: Yes    Partners: Female    Comment: monogamous relationship  Lifestyle  . Physical activity    Days per week: Not on file    Minutes per session: Not on file  . Stress: Not on file  Relationships  . Social  connections    Talks on phone: Not on file    Gets together: Not on file    Attends religious service: Not on file    Active member of club or organization: Not on file    Attends meetings of clubs or organizations: Not on file    Relationship status: Not on file  . Intimate partner violence    Fear of current or ex partner: Not on file    Emotionally abused: Not on file    Physically abused: Not on file    Forced sexual activity: Not on file  Other Topics Concern  . Not on file  Social History Narrative   Regular exercise -- no             Past Surgical History:  Procedure Laterality Date  . bone spur  06-2003   right thumb  . CATARACT  EXTRACTION    . KNEE ARTHROSCOPY  09-2007   partial medial mesicecotmy, patellar chonfroplasty  . SHOULDER SURGERY  03-24-2007   removal bone spur  . SHOULDER SURGERY Left 07/2016   [facility], Dr. Harmon Pier  . TOTAL KNEE ARTHROPLASTY     03-2009 [facility]  . TOTAL KNEE ARTHROPLASTY Left 12/2012   [facility]    Family History  Problem Relation Age of Onset  . Diabetes Mother   . Hyperlipidemia Mother   . Hypertension Mother   . Heart attack Mother   . Obesity Mother   . Hyperlipidemia Father   . Hypertension Father   . Lung cancer Father   . Colon polyps Father   . Hyperlipidemia Brother   . Hypertension Brother   . Hyperlipidemia Brother   . Hypertension Brother   . Hypertension Sister   . Hyperlipidemia Sister   . Lung cancer Sister   . Hyperlipidemia Sister   . Drug abuse Daughter   . Colon cancer Neg Hx   . Esophageal cancer Neg Hx   . Pancreatic cancer Neg Hx   . Rectal cancer Neg Hx     No Known Allergies  Current Outpatient Medications on File Prior to Visit  Medication Sig Dispense Refill  . albuterol (PROVENTIL HFA;VENTOLIN HFA) 108 (90 Base) MCG/ACT inhaler Inhale 2 puffs into the lungs every 4 (four) hours as needed for wheezing or shortness of breath (cough, shortness of breath or wheezing.). 1 Inhaler 1  . amLODipine (NORVASC) 10 MG tablet TAKE 1 TABLET BY MOUTH EVERY DAY 90 tablet 1  . aspirin 81 MG tablet Take 81 mg by mouth daily.      Marland Kitchen atorvastatin (LIPITOR) 80 MG tablet TAKE 1 TABLET BY MOUTH EVERY DAY 90 tablet 1  . cetirizine (ZYRTEC) 10 MG tablet TAKE 1 TABLET BY MOUTH DAILY AS NEEDED FOR ALLERGIES 90 tablet 1  . fish oil-omega-3 fatty acids 1000 MG capsule Take 2 g by mouth daily.      Marland Kitchen losartan (COZAAR) 100 MG tablet TAKE 1 TABLET BY MOUTH EVERY DAY FOR BLOOD PRESSURE 90 tablet 1  . montelukast (SINGULAIR) 10 MG tablet TAKE 1 TABLET BY MOUTH EVERYDAY AT BEDTIME 90 tablet 3  . Multiple Vitamin (MULTIVITAMIN) tablet Take 1 tablet by  mouth daily.      Marland Kitchen omeprazole (PRILOSEC) 20 MG capsule Take 20 mg by mouth daily.      . sertraline (ZOLOFT) 50 MG tablet TAKE 1 TABLET BY MOUTH EVERY DAY 90 tablet 1  . SYMBICORT 160-4.5 MCG/ACT inhaler INHALE 2 PUFFS INTO THE LUNGS TWICE A DAY  30.6 Inhaler 1  . triamcinolone cream (KENALOG) 0.1 % Apply 1 application topically 2 (two) times daily. 30 g 0   Current Facility-Administered Medications on File Prior to Visit  Medication Dose Route Frequency Provider Last Rate Last Dose  . triamcinolone acetonide (KENALOG) 10 MG/ML injection 10 mg  10 mg Other Once Regal, Norman S, DPM        BP 124/80   Pulse 63   Temp 98 F (36.7 C) (Temporal)   Ht 6' (1.829 m)   Wt 270 lb 8 oz (122.7 kg)   SpO2 97%   BMI 36.69 kg/m    Objective:   Physical Exam  Constitutional: He is oriented to person, place, and time. He appears well-nourished.  HENT:  Mouth/Throat: No oropharyngeal exudate.  Eyes: Pupils are equal, round, and reactive to light. EOM are normal.  Neck: Neck supple.  Cardiovascular: Normal rate and regular rhythm.  Respiratory: Effort normal and breath sounds normal.  GI: Soft. Bowel sounds are normal. There is no abdominal tenderness.  Musculoskeletal:     Comments: Ambulates decently in the office.  Neurological: He is alert and oriented to person, place, and time.  Skin: Skin is warm and dry.  Psychiatric: He has a normal mood and affect.           Assessment & Plan:

## 2018-10-31 NOTE — Assessment & Plan Note (Signed)
Doing well on Zoloft 50 mg daily for depression. I also suspect he's better since he's been sober for 6 months. Continue same.

## 2018-10-31 NOTE — Assessment & Plan Note (Signed)
Following with ophthalmology. 

## 2018-10-31 NOTE — Assessment & Plan Note (Signed)
Recent lipid panel with LDL at goal.. Continue atorvastatin.

## 2018-10-31 NOTE — Telephone Encounter (Signed)
Per pts chart pt has already had visit today.

## 2018-10-31 NOTE — Assessment & Plan Note (Signed)
Increase in A1C to 6.3 from 5.7. Encouraged weight loss through exercise and dietary changes. Repeat in 6 months.

## 2018-11-11 DIAGNOSIS — Z96651 Presence of right artificial knee joint: Secondary | ICD-10-CM | POA: Diagnosis not present

## 2018-11-11 DIAGNOSIS — M25561 Pain in right knee: Secondary | ICD-10-CM | POA: Diagnosis not present

## 2018-11-17 ENCOUNTER — Other Ambulatory Visit: Payer: Self-pay | Admitting: Primary Care

## 2018-11-17 DIAGNOSIS — J3089 Other allergic rhinitis: Secondary | ICD-10-CM

## 2018-11-29 DIAGNOSIS — R69 Illness, unspecified: Secondary | ICD-10-CM | POA: Diagnosis not present

## 2018-12-09 DIAGNOSIS — L281 Prurigo nodularis: Secondary | ICD-10-CM | POA: Diagnosis not present

## 2018-12-09 DIAGNOSIS — D485 Neoplasm of uncertain behavior of skin: Secondary | ICD-10-CM | POA: Diagnosis not present

## 2018-12-09 DIAGNOSIS — R208 Other disturbances of skin sensation: Secondary | ICD-10-CM | POA: Diagnosis not present

## 2018-12-09 DIAGNOSIS — B079 Viral wart, unspecified: Secondary | ICD-10-CM | POA: Diagnosis not present

## 2018-12-09 DIAGNOSIS — L299 Pruritus, unspecified: Secondary | ICD-10-CM | POA: Diagnosis not present

## 2019-01-20 DIAGNOSIS — R69 Illness, unspecified: Secondary | ICD-10-CM | POA: Diagnosis not present

## 2019-01-28 DIAGNOSIS — R69 Illness, unspecified: Secondary | ICD-10-CM | POA: Diagnosis not present

## 2019-03-15 DIAGNOSIS — E785 Hyperlipidemia, unspecified: Secondary | ICD-10-CM | POA: Diagnosis not present

## 2019-03-15 DIAGNOSIS — I10 Essential (primary) hypertension: Secondary | ICD-10-CM | POA: Diagnosis not present

## 2019-03-15 DIAGNOSIS — R69 Illness, unspecified: Secondary | ICD-10-CM | POA: Diagnosis not present

## 2019-03-15 DIAGNOSIS — M1990 Unspecified osteoarthritis, unspecified site: Secondary | ICD-10-CM | POA: Diagnosis not present

## 2019-03-15 DIAGNOSIS — E669 Obesity, unspecified: Secondary | ICD-10-CM | POA: Diagnosis not present

## 2019-03-15 DIAGNOSIS — K08409 Partial loss of teeth, unspecified cause, unspecified class: Secondary | ICD-10-CM | POA: Diagnosis not present

## 2019-03-15 DIAGNOSIS — K219 Gastro-esophageal reflux disease without esophagitis: Secondary | ICD-10-CM | POA: Diagnosis not present

## 2019-03-15 DIAGNOSIS — G8929 Other chronic pain: Secondary | ICD-10-CM | POA: Diagnosis not present

## 2019-03-15 DIAGNOSIS — J449 Chronic obstructive pulmonary disease, unspecified: Secondary | ICD-10-CM | POA: Diagnosis not present

## 2019-03-17 DIAGNOSIS — H25811 Combined forms of age-related cataract, right eye: Secondary | ICD-10-CM | POA: Diagnosis not present

## 2019-03-17 DIAGNOSIS — H40013 Open angle with borderline findings, low risk, bilateral: Secondary | ICD-10-CM | POA: Diagnosis not present

## 2019-03-17 DIAGNOSIS — Z961 Presence of intraocular lens: Secondary | ICD-10-CM | POA: Diagnosis not present

## 2019-03-17 DIAGNOSIS — H04123 Dry eye syndrome of bilateral lacrimal glands: Secondary | ICD-10-CM | POA: Diagnosis not present

## 2019-03-17 DIAGNOSIS — H43813 Vitreous degeneration, bilateral: Secondary | ICD-10-CM | POA: Diagnosis not present

## 2019-03-20 ENCOUNTER — Other Ambulatory Visit: Payer: Self-pay | Admitting: Primary Care

## 2019-03-20 DIAGNOSIS — F32A Depression, unspecified: Secondary | ICD-10-CM

## 2019-03-20 DIAGNOSIS — F329 Major depressive disorder, single episode, unspecified: Secondary | ICD-10-CM

## 2019-03-20 DIAGNOSIS — E782 Mixed hyperlipidemia: Secondary | ICD-10-CM

## 2019-03-20 DIAGNOSIS — I10 Essential (primary) hypertension: Secondary | ICD-10-CM

## 2019-04-27 ENCOUNTER — Ambulatory Visit: Payer: Medicare HMO | Attending: Internal Medicine

## 2019-04-27 DIAGNOSIS — Z23 Encounter for immunization: Secondary | ICD-10-CM

## 2019-04-27 NOTE — Progress Notes (Signed)
   Covid-19 Vaccination Clinic  Name:  Alan [name]    MRN: [id number] DOB: [birth year]  04/27/2019  [name] was observed post Covid-19 immunization for 15 minutes without incidence. He was provided with Vaccine Information Sheet and instruction to access the V-Safe system.   [name] was instructed to call 911 with any severe reactions post vaccine: Marland Kitchen Difficulty breathing  . Swelling of your face and throat  . A fast heartbeat  . A bad rash all over your body  . Dizziness and weakness    Immunizations Administered    Name Date Dose VIS Date Route   Pfizer COVID-19 Vaccine 04/27/2019  2:08 PM 0.3 mL 03/13/2019 Intramuscular   Manufacturer: [place]   Lot: [id number]   [place]: [id number]

## 2019-04-28 DIAGNOSIS — H04123 Dry eye syndrome of bilateral lacrimal glands: Secondary | ICD-10-CM | POA: Diagnosis not present

## 2019-04-28 DIAGNOSIS — H43813 Vitreous degeneration, bilateral: Secondary | ICD-10-CM | POA: Diagnosis not present

## 2019-04-28 DIAGNOSIS — Z961 Presence of intraocular lens: Secondary | ICD-10-CM | POA: Diagnosis not present

## 2019-04-28 DIAGNOSIS — H25811 Combined forms of age-related cataract, right eye: Secondary | ICD-10-CM | POA: Diagnosis not present

## 2019-04-28 DIAGNOSIS — H40013 Open angle with borderline findings, low risk, bilateral: Secondary | ICD-10-CM | POA: Diagnosis not present

## 2019-05-02 DIAGNOSIS — H2511 Age-related nuclear cataract, right eye: Secondary | ICD-10-CM | POA: Diagnosis not present

## 2019-05-03 ENCOUNTER — Other Ambulatory Visit: Payer: Self-pay | Admitting: Primary Care

## 2019-05-03 DIAGNOSIS — R7303 Prediabetes: Secondary | ICD-10-CM

## 2019-05-04 ENCOUNTER — Other Ambulatory Visit: Payer: Medicare HMO

## 2019-05-07 DIAGNOSIS — H2511 Age-related nuclear cataract, right eye: Secondary | ICD-10-CM | POA: Diagnosis not present

## 2019-05-18 ENCOUNTER — Other Ambulatory Visit: Payer: Self-pay

## 2019-05-18 ENCOUNTER — Ambulatory Visit: Payer: Medicare HMO | Attending: Internal Medicine

## 2019-05-18 ENCOUNTER — Other Ambulatory Visit (INDEPENDENT_AMBULATORY_CARE_PROVIDER_SITE_OTHER): Payer: Medicare HMO

## 2019-05-18 DIAGNOSIS — R7303 Prediabetes: Secondary | ICD-10-CM | POA: Diagnosis not present

## 2019-05-18 DIAGNOSIS — Z23 Encounter for immunization: Secondary | ICD-10-CM | POA: Insufficient documentation

## 2019-05-18 LAB — POCT GLYCOSYLATED HEMOGLOBIN (HGB A1C): Hemoglobin A1C: 5.9 % — AB (ref 4.0–5.6)

## 2019-05-18 NOTE — Progress Notes (Signed)
   Covid-19 Vaccination Clinic  Name:  ESROM SOY    MRN: [id number] DOB: 15-Sep-1945  05/18/2019  Mr. Hooey was observed post Covid-19 immunization for 15 minutes without incidence. He was provided with Vaccine Information Sheet and instruction to access the V-Safe system.   Mr. Franczyk was instructed to call 911 with any severe reactions post vaccine: Marland Kitchen Difficulty breathing  . Swelling of your face and throat  . A fast heartbeat  . A bad rash all over your body  . Dizziness and weakness    Immunizations Administered    Name Date Dose VIS Date Route   Pfizer COVID-19 Vaccine 05/18/2019 12:06 PM 0.3 mL 03/13/2019 Intramuscular   Manufacturer: [place]   Lot: [id number]   [place]: [id number]

## 2019-06-01 DIAGNOSIS — M25511 Pain in right shoulder: Secondary | ICD-10-CM | POA: Diagnosis not present

## 2019-06-01 DIAGNOSIS — G8929 Other chronic pain: Secondary | ICD-10-CM | POA: Diagnosis not present

## 2019-06-04 ENCOUNTER — Ambulatory Visit (INDEPENDENT_AMBULATORY_CARE_PROVIDER_SITE_OTHER): Payer: Medicare HMO | Admitting: Primary Care

## 2019-06-04 ENCOUNTER — Ambulatory Visit (INDEPENDENT_AMBULATORY_CARE_PROVIDER_SITE_OTHER)
Admission: RE | Admit: 2019-06-04 | Discharge: 2019-06-04 | Disposition: A | Discharge status: Home / Self Care | Payer: Medicare HMO | Source: Ambulatory Visit | Attending: Primary Care | Admitting: Primary Care

## 2019-06-04 ENCOUNTER — Other Ambulatory Visit: Payer: Self-pay

## 2019-06-04 VITALS — BP 120/72 | HR 80 | Temp 96.8°F | Ht 72.0 in | Wt 281.8 lb

## 2019-06-04 DIAGNOSIS — M25511 Pain in right shoulder: Secondary | ICD-10-CM

## 2019-06-04 DIAGNOSIS — G8929 Other chronic pain: Secondary | ICD-10-CM | POA: Diagnosis not present

## 2019-06-04 DIAGNOSIS — R8290 Unspecified abnormal findings in urine: Secondary | ICD-10-CM

## 2019-06-04 DIAGNOSIS — M542 Cervicalgia: Secondary | ICD-10-CM | POA: Diagnosis not present

## 2019-06-04 DIAGNOSIS — R600 Localized edema: Secondary | ICD-10-CM

## 2019-06-04 LAB — POC URINALSYSI DIPSTICK (AUTOMATED)
Bilirubin, UA: NEGATIVE
Blood, UA: NEGATIVE
Glucose, UA: NEGATIVE
Ketones, UA: NEGATIVE
Leukocytes, UA: NEGATIVE
Nitrite, UA: NEGATIVE
Protein, UA: NEGATIVE
Spec Grav, UA: 1.02
Urobilinogen, UA: 0.2 U/dL
pH, UA: 7

## 2019-06-04 LAB — BASIC METABOLIC PANEL
BUN: 19 mg/dL (ref 6–23)
CO2: 32 mEq/L (ref 19–32)
Calcium: 9.8 mg/dL (ref 8.4–10.5)
Chloride: 104 mEq/L (ref 96–112)
Creatinine, Ser: 0.79 mg/dL (ref 0.40–1.50)
GFR: 96.17 mL/min (ref >60.00)
Glucose, Bld: 108 mg/dL — ABNORMAL HIGH (ref 70–99)
Potassium: 4.5 mEq/L (ref 3.5–5.1)
Sodium: 140 mEq/L (ref 135–145)

## 2019-06-04 LAB — BRAIN NATRIURETIC PEPTIDE: Pro B Natriuretic peptide (BNP): 33 pg/mL (ref 0.0–100.0)

## 2019-06-04 NOTE — Patient Instructions (Signed)
Stop by the lab and xray prior to leaving today. I will notify you of your results once received.   Elevate your legs when resting.  Work on weight loss.  Ensure you are consuming 64 ounces of water daily.  It was a pleasure to see you today!

## 2019-06-04 NOTE — Progress Notes (Signed)
Subjective:    [name], male    DOB: 1945-07-08, 74 y.o.   MRN: [id number]  HPI  This visit occurred during the SARS-CoV-2 public health emergency.  Safety protocols were in place, including screening questions prior to the visit, additional usage of staff PPE, and extensive cleaning of exam room while observing appropriate contact time as indicated for disinfecting solutions.   [name] is a 74 year old male with a history of hypertension, COPD, GERD, lumbar radiculopathy, bilateral chronic knee pain, prediabetes, alcohol abuse who presents today with multiple symptoms.  1) Shoulder Pain: Chronic history of arthritis to multiple joints. History of rotator cuff surgery to the left shoulder several years ago. He's noticed symptoms of right shoulder pain with radiation down to right thoracic back, also with lower neck pain. He has also noticed numbness to fingers of right hand.   He denies weakness, color changes, trauma, decrease in ROM to right shoulder. He's been taking Ibuprofen and Tylenol with slight improvement.   2) Pedal Edema: Chronic for years, increased over the last 6 months. He endorses weight gain and has been more sedentary. Several days ago he noticed increased swelling to the top of his feet and at the sockline, had worked for prolonged hours inside of his house for two days, this is a significant increase in activity level from baseline. He denies numbness/tingling to his feet, cough, exertional shortness of breath.   3) Foul Smelling Urine: Dark yellow urine with a foul smell over the last 2 weeks. Also with urinary frequency. He drinks some water during the day, flavored water mostly. He denies dysuria, hematuria.  Wt Readings from Last 3 Encounters:  06/04/19 281 lb 12 oz (127.8 kg)  10/31/18 270 lb 8 oz (122.7 kg)  04/08/18 264 lb 8 oz (120 kg)    BP Readings from Last 3 Encounters:  06/04/19 120/72  10/31/18 124/80  04/08/18 126/82      Review of Systems  Respiratory: Negative for shortness of breath.   Cardiovascular: Positive for leg swelling. Negative for chest pain.  Genitourinary: Positive for frequency. Negative for discharge, dysuria, flank pain and hematuria.       Foul smelling urine       Past Medical History:  Diagnosis Date  . Allergic rhinitis, cause unspecified   . Allergy   . Cancer (HCC)    Basal cell skin cancer,left ear  . Carpal tunnel syndrome   . Chronic airway obstruction, not elsewhere classified   . Esophageal reflux   . Glaucoma (increased eye pressure)   . Lipoma of unspecified site   . Osteoarthrosis, unspecified whether generalized or localized, lower leg   . Other and unspecified hyperlipidemia   . Personal history of colonic polyps   . Personal history of other malignant neoplasm of skin   . Unspecified essential hypertension      Social History   Socioeconomic History  . Marital status: Divorced    Spouse name: Not on file  . Number of children: 2  . Years of education: Not on file  . Highest education level: Not on file  Occupational History  . 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 Smoking status: Former Smoker    Packs/day: 3.00    Years: 25.00    Pack years: 75.00    Quit date: 11/23/1996    Years since quitting: 22.5  . Smokeless tobacco: Never Used  Substance and Sexual Activity  . Alcohol use:  Yes    Alcohol/week: 4.0 standard drinks    Types: 4 Standard drinks or equivalent per week    Comment: 4 mixed drinks per week  . Drug use: No  . Sexual activity: Yes    Partners: Female    Comment: monogamous relationship  Other Topics Concern  . Not on file  Social History Narrative   Regular exercise -- no            Social Determinants of Health   Financial Resource Strain:   . Difficulty of Paying Living Expenses: Not on file  Food Insecurity:   . Worried About Charity fundraiser in the Last Year: Not on file  . Ran Out of  Food in the Last Year: Not on file  Transportation Needs:   . Lack of Transportation (Medical): Not on file  . Lack of Transportation (Non-Medical): Not on file  Physical Activity:   . Days of Exercise per Week: Not on file  . Minutes of Exercise per Session: Not on file  Stress:   . Feeling of Stress : Not on file  Social Connections:   . Frequency of Communication with Friends and Family: Not on file  . Frequency of Social Gatherings with Friends and Family: Not on file  . Attends Religious Services: Not on file  . Active Member of Clubs or Organizations: Not on file  . Attends Archivist Meetings: Not on file  . Marital Status: Not on file  Intimate Partner Violence:   . Fear of Current or Ex-Partner: Not on file  . Emotionally Abused: Not on file  . Physically Abused: Not on file  . Sexually Abused: Not on file    Past Surgical History:  Procedure Laterality Date  . bone spur  06-2003   right thumb  . CATARACT EXTRACTION    . KNEE ARTHROSCOPY  09-2007   partial medial mesicecotmy, patellar chonfroplasty  . SHOULDER SURGERY  03-24-2007   removal bone spur  . SHOULDER SURGERY Left 07/2016   [place], Dr. Harmon Pier  . TOTAL KNEE ARTHROPLASTY     03-2009 [facility]  . TOTAL KNEE ARTHROPLASTY Left 12/2012   [facility]    Family History  Problem Relation Age of Onset  . Diabetes Mother   . Hyperlipidemia Mother   . Hypertension Mother   . Heart attack Mother   . Obesity Mother   . Hyperlipidemia Father   . Hypertension Father   . Lung cancer Father   . Colon polyps Father   . Hyperlipidemia Brother   . Hypertension Brother   . Hyperlipidemia Brother   . Hypertension Brother   . Hypertension Sister   . Hyperlipidemia Sister   . Lung cancer Sister   . Hyperlipidemia Sister   . Drug abuse Daughter   . Colon cancer Neg Hx   . Esophageal cancer Neg Hx   . Pancreatic cancer Neg Hx   . Rectal cancer Neg Hx     No Known Allergies  Current  Outpatient Medications on File Prior to Visit  Medication Sig Dispense Refill  . albuterol (PROVENTIL HFA;VENTOLIN HFA) 108 (90 Base) MCG/ACT inhaler Inhale 2 puffs into the lungs every 4 (four) hours as needed for wheezing or shortness of breath (cough, shortness of breath or wheezing.). 1 Inhaler 1  . amLODipine (NORVASC) 10 MG tablet TAKE 1 TABLET BY MOUTH EVERY DAY 90 tablet 1  . aspirin 81 MG tablet Take 81 mg by mouth daily.      Marland Kitchen  atorvastatin (LIPITOR) 80 MG tablet TAKE 1 TABLET BY MOUTH EVERY DAY 90 tablet 1  . cetirizine (ZYRTEC) 10 MG tablet TAKE 1 TABLET BY MOUTH DAILY AS NEEDED FOR ALLERGIES 90 tablet 2  . fish oil-omega-3 fatty acids 1000 MG capsule Take 2 g by mouth daily.      Marland Kitchen losartan (COZAAR) 100 MG tablet TAKE 1 TABLET BY MOUTH EVERY DAY FOR BLOOD PRESSURE 90 tablet 1  . montelukast (SINGULAIR) 10 MG tablet TAKE 1 TABLET BY MOUTH EVERYDAY AT BEDTIME 90 tablet 3  . Multiple Vitamin (MULTIVITAMIN) tablet Take 1 tablet by mouth daily.      Marland Kitchen omeprazole (PRILOSEC) 20 MG capsule Take 20 mg by mouth daily.      . sertraline (ZOLOFT) 50 MG tablet TAKE 1 TABLET BY MOUTH EVERY DAY 90 tablet 1  . SYMBICORT 160-4.5 MCG/ACT inhaler INHALE 2 PUFFS INTO THE LUNGS TWICE A DAY 30.6 Inhaler 1  . triamcinolone cream (KENALOG) 0.1 % Apply 1 application topically 2 (two) times daily. 30 g 0   Current Facility-Administered Medications on File Prior to Visit  Medication Dose Route Frequency Provider Last Rate Last Admin  . triamcinolone acetonide (KENALOG) 10 MG/ML injection 10 mg  10 mg Other Once Regal, Norman S, DPM        BP 120/72   Pulse 80   Temp (!) 96.8 F (36 C) (Temporal)   Ht 6' (1.829 m)   Wt 281 lb 12 oz (127.8 kg)   SpO2 97%   BMI 38.21 kg/m    Objective:   Physical Exam  Constitutional: He appears well-nourished.  Neck:    Cardiovascular: Normal rate and regular rhythm.  Pulses:      Dorsalis pedis pulses are 2+ on the right side and 2+ on the left side.        Posterior tibial pulses are 2+ on the right side and 2+ on the left side.  Slight pedal and lower extremity edema noted, indention at sock line noted to lower extremities.   Respiratory: Effort normal and breath sounds normal.  Musculoskeletal:     Right shoulder: Pain present. No tenderness. Normal range of motion.     Cervical back: Normal range of motion and neck supple. Muscular tenderness present. No spinous process tenderness.     Comments: 5/5 strength to bilateral upper extremities. Negative empty can test.  Skin: Skin is warm and dry.  Psychiatric: He has a normal mood and affect.           Assessment & Plan:

## 2019-06-04 NOTE — Assessment & Plan Note (Signed)
Noted on exam, good pedal pulses, no color change. Suspect this is secondary to weight gain and sedentary lifestyle with abrupt changes in activity level.  Encouraged weight loss, encouraged regular activity during the day, encouraged elevation of extremities during rest.  Check labs including BMP and BNP.

## 2019-06-04 NOTE — Assessment & Plan Note (Signed)
History of rotator cuff repair to left shoulder. Exam today without weakness or decrease in ROM to right shoulder.  Suspect pain could be originating from cervical spine, especially given numbness to fingers.  Check plain films. Consider PT vs Ortho referral.

## 2019-06-04 NOTE — Assessment & Plan Note (Signed)
Chronic with radiation to right mid thoracic back, also with numbness to fingers. Check plain films.

## 2019-06-04 NOTE — Assessment & Plan Note (Signed)
Potent urine sample provided today, otherwise negative and without blood, infection, etc. Culture sent.  Encouraged to increase water intake.

## 2019-06-05 LAB — URINE CULTURE
MICRO NUMBER:: 10214915
SPECIMEN QUALITY:: ADEQUATE

## 2019-06-29 ENCOUNTER — Other Ambulatory Visit: Payer: Self-pay | Admitting: Primary Care

## 2019-07-10 ENCOUNTER — Ambulatory Visit (INDEPENDENT_AMBULATORY_CARE_PROVIDER_SITE_OTHER): Payer: Medicare HMO

## 2019-07-10 ENCOUNTER — Other Ambulatory Visit: Payer: Self-pay

## 2019-07-10 DIAGNOSIS — Z0000 Encounter for general adult medical examination without abnormal findings: Secondary | ICD-10-CM

## 2019-07-10 NOTE — Progress Notes (Signed)
PCP notes:  Health Maintenance: No gaps noted   Abnormal Screenings: none   Patient concerns: Patient wants to discuss starting on some medication to help with the arthritis in his neck.    Nurse concerns: none   Next PCP appt.: none

## 2019-07-10 NOTE — Patient Instructions (Signed)
Alan [name] , Thank you for taking time to come for your Medicare Wellness Visit. I appreciate your ongoing commitment to your health goals. Please review the following plan we discussed and let me know if I can assist you in the future.   Screening recommendations/referrals: Colonoscopy: Up to date, completed 11/13/2017 Recommended yearly ophthalmology/optometry visit for glaucoma screening and checkup Recommended yearly dental visit for hygiene and checkup  Vaccinations: Influenza vaccine: Fall 2021 Pneumococcal vaccine: Completed series Tdap vaccine: Up to date, completed 10/15/2016 Shingles vaccine: discussed    Advanced directives: Advance directive discussed with you today. Even though you declined this today please call our office should you change your mind and we can give you the proper paperwork for you to fill out.  Conditions/risks identified: hypertension, hyperlipidemia  Next appointment: none  Preventive Care 37 Years and Older, Male Preventive care refers to lifestyle choices and visits with your health care provider that can promote health and wellness. What does preventive care include?  A yearly physical exam. This is also called an annual well check.  Dental exams once or twice a year.  Routine eye exams. Ask your health care provider how often you should have your eyes checked.  Personal lifestyle choices, including:  Daily care of your teeth and gums.  Regular physical activity.  Eating a healthy diet.  Avoiding tobacco and drug use.  Limiting alcohol use.  Practicing safe sex.  Taking low doses of aspirin every day.  Taking vitamin and mineral supplements as recommended by your health care provider. What happens during an annual well check? The services and screenings done by your health care provider during your annual well check will depend on your age, overall health, lifestyle risk factors, and family history of disease. Counseling  Your  health care provider may ask you questions about your:  Alcohol use.  Tobacco use.  Drug use.  Emotional well-being.  Home and relationship well-being.  Sexual activity.  Eating habits.  History of falls.  Memory and ability to understand (cognition).  Work and work Statistician. Screening  You may have the following tests or measurements:  Height, weight, and BMI.  Blood pressure.  Lipid and cholesterol levels. These may be checked every 5 years, or more frequently if you are over 31 years old.  Skin check.  Lung cancer screening. You may have this screening every year starting at age 81 if you have a 30-pack-year history of smoking and currently smoke or have quit within the past 15 years.  Fecal occult blood test (FOBT) of the stool. You may have this test every year starting at age 22.  Flexible sigmoidoscopy or colonoscopy. You may have a sigmoidoscopy every 5 years or a colonoscopy every 10 years starting at age 2.  Prostate cancer screening. Recommendations will vary depending on your family history and other risks.  Hepatitis C blood test.  Hepatitis B blood test.  Sexually transmitted disease (STD) testing.  Diabetes screening. This is done by checking your blood sugar (glucose) after you have not eaten for a while (fasting). You may have this done every 1-3 years.  Abdominal aortic aneurysm (AAA) screening. You may need this if you are a current or former smoker.  Osteoporosis. You may be screened starting at age 33 if you are at high risk. Talk with your health care provider about your test results, treatment options, and if necessary, the need for more tests. Vaccines  Your health care provider may recommend certain vaccines, such as:  Influenza vaccine. This is recommended every year.  Tetanus, diphtheria, and acellular pertussis (Tdap, Td) vaccine. You may need a Td booster every 10 years.  Zoster vaccine. You may need this after age  7.  Pneumococcal 13-valent conjugate (PCV13) vaccine. One dose is recommended after age 54.  Pneumococcal polysaccharide (PPSV23) vaccine. One dose is recommended after age 41. Talk to your health care provider about which screenings and vaccines you need and how often you need them. This information is not intended to replace advice given to you by your health care provider. Make sure you discuss any questions you have with your health care provider. Document Released: 04/15/2015 Document Revised: 12/07/2015 Document Reviewed: 01/18/2015 Elsevier Interactive Patient Education  2017 [place] Prevention in the Home Falls can cause injuries. They can happen to people of all ages. There are many things you can do to make your home safe and to help prevent falls. What can I do on the outside of my home?  Regularly fix the edges of walkways and driveways and fix any cracks.  Remove anything that might make you trip as you walk through a door, such as a raised step or threshold.  Trim any bushes or trees on the path to your home.  Use bright outdoor lighting.  Clear any walking paths of anything that might make someone trip, such as rocks or tools.  Regularly check to see if handrails are loose or broken. Make sure that both sides of any steps have handrails.  Any raised decks and porches should have guardrails on the edges.  Have any leaves, snow, or ice cleared regularly.  Use sand or salt on walking paths during winter.  Clean up any spills in your garage right away. This includes oil or grease spills. What can I do in the bathroom?  Use night lights.  Install grab bars by the toilet and in the tub and shower. Do not use towel bars as grab bars.  Use non-skid mats or decals in the tub or shower.  If you need to sit down in the shower, use a plastic, non-slip stool.  Keep the floor dry. Clean up any water that spills on the floor as soon as it happens.  Remove  soap buildup in the tub or shower regularly.  Attach bath mats securely with double-sided non-slip rug tape.  Do not have throw rugs and other things on the floor that can make you trip. What can I do in the bedroom?  Use night lights.  Make sure that you have a light by your bed that is easy to reach.  Do not use any sheets or blankets that are too big for your bed. They should not hang down onto the floor.  Have a firm chair that has side arms. You can use this for support while you get dressed.  Do not have throw rugs and other things on the floor that can make you trip. What can I do in the kitchen?  Clean up any spills right away.  Avoid walking on wet floors.  Keep items that you use a lot in easy-to-reach places.  If you need to reach something above you, use a strong step stool that has a grab bar.  Keep electrical cords out of the way.  Do not use floor polish or wax that makes floors slippery. If you must use wax, use non-skid floor wax.  Do not have throw rugs and other things on the floor that  can make you trip. What can I do with my stairs?  Do not leave any items on the stairs.  Make sure that there are handrails on both sides of the stairs and use them. Fix handrails that are broken or loose. Make sure that handrails are as long as the stairways.  Check any carpeting to make sure that it is firmly attached to the stairs. Fix any carpet that is loose or worn.  Avoid having throw rugs at the top or bottom of the stairs. If you do have throw rugs, attach them to the floor with carpet tape.  Make sure that you have a light switch at the top of the stairs and the bottom of the stairs. If you do not have them, ask someone to add them for you. What else can I do to help prevent falls?  Wear shoes that:  Do not have high heels.  Have rubber bottoms.  Are comfortable and fit you well.  Are closed at the toe. Do not wear sandals.  If you use a  stepladder:  Make sure that it is fully opened. Do not climb a closed stepladder.  Make sure that both sides of the stepladder are locked into place.  Ask someone to hold it for you, if possible.  Clearly mark and make sure that you can see:  Any grab bars or handrails.  First and last steps.  Where the edge of each step is.  Use tools that help you move around (mobility aids) if they are needed. These include:  Canes.  Walkers.  Scooters.  Crutches.  Turn on the lights when you go into a dark area. Replace any light bulbs as soon as they burn out.  Set up your furniture so you have a clear path. Avoid moving your furniture around.  If any of your floors are uneven, fix them.  If there are any pets around you, be aware of where they are.  Review your medicines with your doctor. Some medicines can make you feel dizzy. This can increase your chance of falling. Ask your doctor what other things that you can do to help prevent falls. This information is not intended to replace advice given to you by your health care provider. Make sure you discuss any questions you have with your health care provider. Document Released: 01/13/2009 Document Revised: 08/25/2015 Document Reviewed: 04/23/2014 Elsevier Interactive Patient Education  2017 [employer].

## 2019-07-10 NOTE — Progress Notes (Signed)
Subjective:   RODDERICK ORSAK is a 74 y.o. male who presents for Medicare Annual/Subsequent preventive examination.  Review of Systems: N/A   This visit is being conducted through telemedicine via telephone at the nurse health advisor's home address due to the COVID-19 pandemic. This patient has given me verbal consent via doximity to conduct this visit, patient states they are participating from their home address. Patient and myself are on the telephone call. There is no referral for this visit. Some vital signs may be absent or patient reported.    Patient identification: identified by name, DOB, and current address   Cardiac Risk Factors include: advanced age (>6men, >67 women);male gender;hypertension;dyslipidemia     Objective:    Vitals: There were no vitals taken for this visit.  There is no height or weight on file to calculate BMI.  Advanced Directives 07/10/2019 10/18/2017 10/12/2016  Does Patient Have a Medical Advance Directive? No No No  Does patient want to make changes to medical advance directive? - - Yes (MAU/Ambulatory/Procedural Areas - Information given)  Would patient like information on creating a medical advance directive? No - Patient declined No - Patient declined -    Tobacco Social History   Tobacco Use  Smoking Status Former Smoker  . Packs/day: 3.00  . Years: 25.00  . Pack years: 75.00  . Quit date: 11/23/1996  . Years since quitting: 22.6  Smokeless Tobacco Never Used     Counseling given: Not Answered   Clinical Intake:  Pre-visit preparation completed: Yes  Pain : No/denies pain     Nutritional Risks: None Diabetes: No  How often do you need to have someone help you when you read instructions, pamphlets, or other written materials from your doctor or pharmacy?: 1 - Never What is the last grade level you completed in school?: 12th  Interpreter Needed?: No  Information entered by :: CJohnson, LPN  Past Medical History:    Diagnosis Date  . Allergic rhinitis, cause unspecified   . Allergy   . Cancer (HCC)    Basal cell skin cancer,left ear  . Carpal tunnel syndrome   . Chronic airway obstruction, not elsewhere classified   . Esophageal reflux   . Glaucoma (increased eye pressure)   . Lipoma of unspecified site   . Osteoarthrosis, unspecified whether generalized or localized, lower leg   . Other and unspecified hyperlipidemia   . Personal history of colonic polyps   . Personal history of other malignant neoplasm of skin   . Unspecified essential hypertension    Past Surgical History:  Procedure Laterality Date  . bone spur  06-2003   right thumb  . CATARACT EXTRACTION    . KNEE ARTHROSCOPY  09-2007   partial medial mesicecotmy, patellar chonfroplasty  . SHOULDER SURGERY  03-24-2007   removal bone spur  . SHOULDER SURGERY Left 07/2016   [place], Dr. Harmon Pier  . TOTAL KNEE ARTHROPLASTY     03-2009 [facility]  . TOTAL KNEE ARTHROPLASTY Left 12/2012   [facility]   Family History  Problem Relation Age of Onset  . Diabetes Mother   . Hyperlipidemia Mother   . Hypertension Mother   . Heart attack Mother   . Obesity Mother   . Hyperlipidemia Father   . Hypertension Father   . Lung cancer Father   . Colon polyps Father   . Hyperlipidemia Brother   . Hypertension Brother   . Hyperlipidemia Brother   . Hypertension Brother   .  Hypertension Sister   . Hyperlipidemia Sister   . Lung cancer Sister   . Hyperlipidemia Sister   . Drug abuse Daughter   . Colon cancer Neg Hx   . Esophageal cancer Neg Hx   . Pancreatic cancer Neg Hx   . Rectal cancer Neg Hx    Social History   Socioeconomic History  . Marital status: Divorced    Spouse name: Not on file  . Number of children: 2  . Years of education: Not on file  . Highest education level: Not on file  Occupational History  . 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 Smoking status: Former Smoker    Packs/day:  3.00    Years: 25.00    Pack years: 75.00    Quit date: 11/23/1996    Years since quitting: 22.6  . Smokeless tobacco: Never Used  Substance and Sexual Activity  . Alcohol use: Not Currently    Alcohol/week: 0.0 standard drinks  . Drug use: No  . Sexual activity: Yes    Partners: Female    Comment: monogamous relationship  Other Topics Concern  . Not on file  Social History Narrative   Regular exercise -- no            Social Determinants of Health   Financial Resource Strain: Low Risk   . Difficulty of Paying Living Expenses: Not hard at all  Food Insecurity: No Food Insecurity  . Worried About Charity fundraiser in the Last Year: Never true  . Ran Out of Food in the Last Year: Never true  Transportation Needs: No Transportation Needs  . Lack of Transportation (Medical): No  . Lack of Transportation (Non-Medical): No  Physical Activity: Sufficiently Active  . Days of Exercise per Week: 3 days  . Minutes of Exercise per Session: 50 min  Stress: No Stress Concern Present  . Feeling of Stress : Not at all  Social Connections:   . Frequency of Communication with Friends and Family:   . Frequency of Social Gatherings with Friends and Family:   . Attends Religious Services:   . Active Member of Clubs or Organizations:   . Attends Archivist Meetings:   Marland Kitchen Marital Status:     Outpatient Encounter Medications as of 07/10/2019  Medication Sig  . albuterol (PROVENTIL HFA;VENTOLIN HFA) 108 (90 Base) MCG/ACT inhaler Inhale 2 puffs into the lungs every 4 (four) hours as needed for wheezing or shortness of breath (cough, shortness of breath or wheezing.).  Marland Kitchen amLODipine (NORVASC) 10 MG tablet TAKE 1 TABLET BY MOUTH EVERY DAY  . aspirin 81 MG tablet Take 81 mg by mouth daily.    Marland Kitchen atorvastatin (LIPITOR) 80 MG tablet TAKE 1 TABLET BY MOUTH EVERY DAY  . cetirizine (ZYRTEC) 10 MG tablet TAKE 1 TABLET BY MOUTH DAILY AS NEEDED FOR ALLERGIES  . fish oil-omega-3 fatty acids 1000  MG capsule Take 2 g by mouth daily.    Marland Kitchen losartan (COZAAR) 100 MG tablet TAKE 1 TABLET BY MOUTH EVERY DAY FOR BLOOD PRESSURE  . montelukast (SINGULAIR) 10 MG tablet TAKE 1 TABLET BY MOUTH EVERYDAY AT BEDTIME  . Multiple Vitamin (MULTIVITAMIN) tablet Take 1 tablet by mouth daily.    Marland Kitchen omeprazole (PRILOSEC) 20 MG capsule Take 20 mg by mouth daily.    . sertraline (ZOLOFT) 50 MG tablet TAKE 1 TABLET BY MOUTH EVERY DAY  . SYMBICORT 160-4.5 MCG/ACT inhaler INHALE 2 PUFFS INTO THE LUNGS TWICE A DAY  .  triamcinolone cream (KENALOG) 0.1 % Apply 1 application topically 2 (two) times daily.   Facility-Administered Encounter Medications as of 07/10/2019  Medication  . triamcinolone acetonide (KENALOG) 10 MG/ML injection 10 mg    Activities of Daily Living In your present state of health, do you have any difficulty performing the following activities: 07/10/2019  Hearing? N  Vision? N  Difficulty concentrating or making decisions? N  Walking or climbing stairs? N  Dressing or bathing? N  Doing errands, shopping? N  Preparing Food and eating ? N  Using the Toilet? N  In the past six months, have you accidently leaked urine? N  Do you have problems with loss of bowel control? N  Managing your Medications? N  Managing your Finances? N  Housekeeping or managing your Housekeeping? N  Some recent data might be hidden    Patient Care Team: Pleas Koch, NP as PCP - General (Nurse Practitioner) Warden Fillers, MD as Consulting Physician (Ophthalmology) Garrel Ridgel, [place] as Consulting Physician (Podiatry) Marchia Bond, MD as Consulting Physician (Orthopedic Surgery) Gerda Diss, DO as Consulting Physician (Sports Medicine)   Assessment:   This is a routine wellness examination for Kiing.  Exercise Activities and Dietary recommendations Current Exercise Habits: Structured exercise class, Type of exercise: strength training/weights, Time (Minutes): 45, Frequency (Times/Week): 3,  Weekly Exercise (Minutes/Week): 135, Intensity: Moderate, Exercise limited by: None identified  Goals    . Increase physical activity (pt-stated)     Starting 10/12/2016, I will resume my previous exercise regimen of going to the gym 3 days weekly.    . Patient Stated     Starting 10/18/2017, I will continue to take medications as prescribed.     . Patient Stated     07/10/2019, I will continue to go to the gym 3 days a week for 45 minutes.        Fall Risk Fall Risk  07/10/2019 10/18/2017 10/12/2016 05/03/2014 04/27/2013  Falls in the past year? 0 No No No No  Number falls in past yr: 0 - - - -  Injury with Fall? 0 - - - -  Risk for fall due to : Medication side effect - - - -  Follow up Falls prevention discussed;Falls evaluation completed - - - -   Is the patient's home free of loose throw rugs in walkways, pet beds, electrical cords, etc?   yes      Grab bars in the bathroom? no      Handrails on the stairs?   yes      Adequate lighting?   yes  Timed Get Up and Go Performed: N/A  Depression Screen PHQ 2/9 Scores 07/10/2019 10/18/2017 10/12/2016 05/06/2015  PHQ - 2 Score 0 0 0 1  PHQ- 9 Score 0 0 - -  Some encounter information is confidential and restricted. Go to Review Flowsheets activity to see all data.    Cognitive Function MMSE - Mini Mental State Exam 07/10/2019 10/18/2017 10/12/2016  Orientation to time 5 5 5   Orientation to Place 5 5 5   Registration 3 3 3   Attention/ Calculation 5 0 0  Recall 3 3 1   Language- name 2 objects - 0 0  Language- repeat 1 1 1   Language- follow 3 step command - 3 3  Language- read & follow direction - 0 0  Write a sentence - 0 0  Copy design - 0 0  Total score - 20 18  Mini Cog  Mini-Cog screen was  completed. Maximum score is 22. A value of 0 denotes this part of the MMSE was not completed or the patient failed this part of the Mini-Cog screening.       Immunization History  Administered Date(s) Administered  . Influenza Split 03/02/2011,  02/28/2013  . Influenza Whole 01/24/2007, 01/06/2008, 02/02/2009  . Influenza, High Dose Seasonal PF 12/26/2016, 12/22/2017  . Influenza,inj,Quad PF,6+ Mos 12/02/2015  . Influenza-Unspecified 03/22/2014, 01/05/2015  . PFIZER SARS-COV-2 Vaccination 04/27/2019, 05/18/2019  . Pneumococcal Conjugate-13 05/03/2014  . Pneumococcal Polysaccharide-23 12/05/2011  . Td 06/13/2006  . Tdap 10/15/2016  . Zoster 09/03/2006    Qualifies for Shingles Vaccine: Yes  Screening Tests Health Maintenance  Topic Date Due  . INFLUENZA VACCINE  11/01/2019  . COLONOSCOPY  11/13/2020  . TETANUS/TDAP  10/16/2026  . Hepatitis C Screening  Completed  . PNA vac Low Risk Adult  Completed   Cancer Screenings: Lung: Low Dose CT Chest recommended if Age 1-80 years, 30 pack-year currently smoking OR have quit w/in 15 years. Patient does not qualify. Colorectal: completed 11/13/2017  Additional Screenings:  Hepatitis C Screening: 12/02/2015      Plan:   Patient will continue to go to the gym 3 days a week for 45 minutes.     I have personally reviewed and noted the following in the patient's chart:   . Medical and social history . Use of alcohol, tobacco or illicit drugs  . Current medications and supplements . Functional ability and status . Nutritional status . Physical activity . Advanced directives . List of other physicians . Hospitalizations, surgeries, and ER visits in previous 12 months . Vitals . Screenings to include cognitive, depression, and falls . Referrals and appointments  In addition, I have reviewed and discussed with patient certain preventive protocols, quality metrics, and best practice recommendations. A written personalized care plan for preventive services as well as general preventive health recommendations were provided to patient.     Andrez Grime, LPN  [id number]

## 2019-07-29 DIAGNOSIS — R69 Illness, unspecified: Secondary | ICD-10-CM | POA: Diagnosis not present

## 2019-08-18 DIAGNOSIS — M545 Low back pain: Secondary | ICD-10-CM | POA: Diagnosis not present

## 2019-08-18 DIAGNOSIS — S39012A Strain of muscle, fascia and tendon of lower back, initial encounter: Secondary | ICD-10-CM | POA: Diagnosis not present

## 2019-09-14 ENCOUNTER — Other Ambulatory Visit: Payer: Self-pay | Admitting: Primary Care

## 2019-09-14 DIAGNOSIS — E782 Mixed hyperlipidemia: Secondary | ICD-10-CM

## 2019-09-14 DIAGNOSIS — F32A Depression, unspecified: Secondary | ICD-10-CM

## 2019-09-14 DIAGNOSIS — I10 Essential (primary) hypertension: Secondary | ICD-10-CM

## 2019-09-24 ENCOUNTER — Encounter: Payer: Self-pay | Admitting: Primary Care

## 2019-09-24 ENCOUNTER — Ambulatory Visit (INDEPENDENT_AMBULATORY_CARE_PROVIDER_SITE_OTHER): Payer: Medicare HMO | Admitting: Primary Care

## 2019-09-24 ENCOUNTER — Other Ambulatory Visit: Payer: Self-pay

## 2019-09-24 VITALS — BP 136/84 | HR 65 | Temp 96.8°F | Ht 72.0 in | Wt 274.5 lb

## 2019-09-24 DIAGNOSIS — R7303 Prediabetes: Secondary | ICD-10-CM

## 2019-09-24 LAB — POCT GLYCOSYLATED HEMOGLOBIN (HGB A1C): Hemoglobin A1C: 6.1 % — AB (ref 4.0–5.6)

## 2019-09-24 NOTE — Progress Notes (Signed)
Subjective:    [name], male    DOB: December 23, 1945, 74 y.o.   MRN: [id number]  HPI  This visit occurred during the SARS-CoV-2 public health emergency.  Safety protocols were in place, including screening questions prior to the visit, additional usage of staff PPE, and extensive cleaning of exam room while observing appropriate contact time as indicated for disinfecting solutions.   Alan [name] is a 74 year old male with a history of prediabetes, COPD, GERD, hypertension, hyperlipidemia, obesity, alcohol abuse, chronic pain who presents today for follow up of prediabetes.   He has been sober from alcohol for one year and five months.   Diet currently consists of:  Breakfast: [place] sausage patty sandwich  Lunch: Sandwich sometimes, sometimes skips Dinner: Meat, vegetable, starch; salads Snacks: No snacking  Desserts: Infrequent  Beverages: Water, diet soda  Exercise: He is active in his yard several days weekly.    BP Readings from Last 3 Encounters:  09/24/19 136/84  06/04/19 120/72  10/31/18 124/80   Wt Readings from Last 3 Encounters:  09/24/19 274 lb 8 oz (124.5 kg)  06/04/19 281 lb 12 oz (127.8 kg)  10/31/18 270 lb 8 oz (122.7 kg)     Review of Systems  Respiratory: Negative for shortness of breath.   Cardiovascular: Negative for chest pain.  Neurological: Negative for dizziness and numbness.       Past Medical History:  Diagnosis Date  . Allergic rhinitis, cause unspecified   . Allergy   . Cancer (HCC)    Basal cell skin cancer,left ear  . Carpal tunnel syndrome   . Chronic airway obstruction, not elsewhere classified   . Esophageal reflux   . Glaucoma (increased eye pressure)   . Lipoma of unspecified site   . Osteoarthrosis, unspecified whether generalized or localized, lower leg   . Other and unspecified hyperlipidemia   . Personal history of colonic polyps   . Personal history of other malignant neoplasm of skin   . Unspecified  essential hypertension      Social History   Socioeconomic History  . Marital status: Divorced    Spouse name: Not on file  . Number of children: 2  . Years of education: Not on file  . Highest education level: Not on file  Occupational History  . 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 Smoking status: Former Smoker    Packs/day: 3.00    Years: 25.00    Pack years: 75.00    Quit date: 11/23/1996    Years since quitting: 22.8  . Smokeless tobacco: Never Used  Vaping Use  . Vaping Use: Never used  Substance and Sexual Activity  . Alcohol use: Not Currently    Alcohol/week: 0.0 standard drinks  . Drug use: No  . Sexual activity: Yes    Partners: Female    Comment: monogamous relationship  Other Topics Concern  . Not on file  Social History Narrative   Regular exercise -- no            Social Determinants of Health   Financial Resource Strain: Low Risk   . Difficulty of Paying Living Expenses: Not hard at all  Food Insecurity: No Food Insecurity  . Worried About Charity fundraiser in the Last Year: Never true  . Ran Out of Food in the Last Year: Never true  Transportation Needs: No Transportation Needs  . Lack of Transportation (Medical): No  . Lack of Transportation (Non-Medical):  No  Physical Activity: Sufficiently Active  . Days of Exercise per Week: 3 days  . Minutes of Exercise per Session: 50 min  Stress: No Stress Concern Present  . Feeling of Stress : Not at all  Social Connections:   . Frequency of Communication with Friends and Family:   . Frequency of Social Gatherings with Friends and Family:   . Attends Religious Services:   . Active Member of Clubs or Organizations:   . Attends Archivist Meetings:   Marland Kitchen Marital Status:   Intimate Partner Violence: Not At Risk  . Fear of Current or Ex-Partner: No  . Emotionally Abused: No  . Physically Abused: No  . Sexually Abused: No    Past Surgical History:  Procedure  Laterality Date  . bone spur  06-2003   right thumb  . CATARACT EXTRACTION    . KNEE ARTHROSCOPY  09-2007   partial medial mesicecotmy, patellar chonfroplasty  . SHOULDER SURGERY  03-24-2007   removal bone spur  . SHOULDER SURGERY Left 07/2016   [place], Dr. Harmon Pier  . TOTAL KNEE ARTHROPLASTY     03-2009 [facility]  . TOTAL KNEE ARTHROPLASTY Left 12/2012   [facility]    Family History  Problem Relation Age of Onset  . Diabetes Mother   . Hyperlipidemia Mother   . Hypertension Mother   . Heart attack Mother   . Obesity Mother   . Hyperlipidemia Father   . Hypertension Father   . Lung cancer Father   . Colon polyps Father   . Hyperlipidemia Brother   . Hypertension Brother   . Hyperlipidemia Brother   . Hypertension Brother   . Hypertension Sister   . Hyperlipidemia Sister   . Lung cancer Sister   . Hyperlipidemia Sister   . Drug abuse Daughter   . Colon cancer Neg Hx   . Esophageal cancer Neg Hx   . Pancreatic cancer Neg Hx   . Rectal cancer Neg Hx     No Known Allergies  Current Outpatient Medications on File Prior to Visit  Medication Sig Dispense Refill  . albuterol (PROVENTIL HFA;VENTOLIN HFA) 108 (90 Base) MCG/ACT inhaler Inhale 2 puffs into the lungs every 4 (four) hours as needed for wheezing or shortness of breath (cough, shortness of breath or wheezing.). 1 Inhaler 1  . amLODipine (NORVASC) 10 MG tablet TAKE 1 TABLET BY MOUTH EVERY DAY 90 tablet 1  . aspirin 81 MG tablet Take 81 mg by mouth daily.      Marland Kitchen atorvastatin (LIPITOR) 80 MG tablet TAKE 1 TABLET BY MOUTH EVERY DAY 90 tablet 1  . cetirizine (ZYRTEC) 10 MG tablet TAKE 1 TABLET BY MOUTH DAILY AS NEEDED FOR ALLERGIES 90 tablet 2  . fish oil-omega-3 fatty acids 1000 MG capsule Take 2 g by mouth daily.      Marland Kitchen losartan (COZAAR) 100 MG tablet TAKE 1 TABLET BY MOUTH EVERY DAY FOR BLOOD PRESSURE 90 tablet 1  . montelukast (SINGULAIR) 10 MG tablet TAKE 1 TABLET BY MOUTH EVERYDAY AT BEDTIME 90  tablet 3  . Multiple Vitamin (MULTIVITAMIN) tablet Take 1 tablet by mouth daily.      Marland Kitchen omeprazole (PRILOSEC) 20 MG capsule Take 20 mg by mouth daily.      . sertraline (ZOLOFT) 50 MG tablet TAKE 1 TABLET BY MOUTH EVERY DAY 90 tablet 1  . SYMBICORT 160-4.5 MCG/ACT inhaler INHALE 2 PUFFS INTO THE LUNGS TWICE A DAY 30.6 Inhaler 1  . triamcinolone cream (  KENALOG) 0.1 % Apply 1 application topically 2 (two) times daily. 30 g 0   Current Facility-Administered Medications on File Prior to Visit  Medication Dose Route Frequency Provider Last Rate Last Admin  . triamcinolone acetonide (KENALOG) 10 MG/ML injection 10 mg  10 mg Other Once Regal, Norman S, DPM        BP 136/84   Pulse 65   Temp (!) 96.8 F (36 C) (Temporal)   Ht 6' (1.829 m)   Wt 274 lb 8 oz (124.5 kg)   SpO2 98%   BMI 37.23 kg/m    Objective:   Physical Exam  Cardiovascular: Normal rate and regular rhythm.  Respiratory: Effort normal and breath sounds normal.  Neurological: He is alert.  Skin: Skin is warm and dry.  Psychiatric: Mood normal.           Assessment & Plan:

## 2019-09-24 NOTE — Patient Instructions (Addendum)
It's important to improve your diet by reducing consumption of fast food, fried food, processed snack foods, sugary drinks. Increase consumption of fresh vegetables and fruits, whole grains, water.  Ensure you are drinking 64 ounces of water daily.  Continue exercising. You should be getting 150 minutes of moderate intensity exercise weekly.  Please schedule a physical with me for on or after September 24th.   It was a pleasure to see you today!   Prediabetes Eating Plan Prediabetes is a condition that causes blood sugar (glucose) levels to be higher than normal. This increases the risk for developing diabetes. In order to prevent diabetes from developing, your health care provider may recommend a diet and other lifestyle changes to help you:  Control your blood glucose levels.  Improve your cholesterol levels.  Manage your blood pressure. Your health care provider may recommend working with a diet and nutrition specialist (dietitian) to make a meal plan that is best for you. What are tips for following this plan? Lifestyle  Set weight loss goals with the help of your health care team. It is recommended that most people with prediabetes lose 7% of their current body weight.  Exercise for at least 30 minutes at least 5 days a week.  Attend a support group or seek ongoing support from a mental health counselor.  Take over-the-counter and prescription medicines only as told by your health care provider. Reading food labels  Read food labels to check the amount of fat, salt (sodium), and sugar in prepackaged foods. Avoid foods that have: ? Saturated fats. ? Trans fats. ? Added sugars.  Avoid foods that have more than 300 milligrams (mg) of sodium per serving. Limit your daily sodium intake to less than 2,300 mg each day. Shopping  Avoid buying pre-made and processed foods. Cooking  Cook with olive oil. Do not use butter, lard, or ghee.  Bake, broil, grill, or boil foods.  Avoid frying. Meal planning   Work with your dietitian to develop an eating plan that is right for you. This may include: ? Tracking how many calories you take in. Use a food diary, notebook, or mobile application to track what you eat at each meal. ? Using the glycemic index (GI) to plan your meals. The index tells you how quickly a food will raise your blood glucose. Choose low-GI foods. These foods take a longer time to raise blood glucose.  Consider following a Mediterranean diet. This diet includes: ? Several servings each day of fresh fruits and vegetables. ? Eating fish at least twice a week. ? Several servings each day of whole grains, beans, nuts, and seeds. ? Using olive oil instead of other fats. ? Moderate alcohol consumption. ? Eating small amounts of red meat and whole-fat dairy.  If you have high blood pressure, you may need to limit your sodium intake or follow a diet such as the DASH eating plan. DASH is an eating plan that aims to lower high blood pressure. What foods are recommended? The items listed below may not be a complete list. Talk with your dietitian about what dietary choices are best for you. Grains Whole grains, such as whole-wheat or whole-grain breads, crackers, cereals, and pasta. Unsweetened oatmeal. Bulgur. Barley. Quinoa. Brown rice. Corn or whole-wheat flour tortillas or taco shells. Vegetables Lettuce. Spinach. Peas. Beets. Cauliflower. Cabbage. Broccoli. Carrots. Tomatoes. Squash. Eggplant. Herbs. Peppers. Onions. Cucumbers. Brussels sprouts. Fruits Berries. Bananas. Apples. Oranges. Grapes. Papaya. Mango. Pomegranate. Kiwi. Grapefruit. Cherries. Meats and other protein foods  Seafood. Poultry without skin. Lean cuts of pork and beef. Tofu. Eggs. Nuts. Beans. Dairy Low-fat or fat-free dairy products, such as yogurt, cottage cheese, and cheese. Beverages Water. Tea. Coffee. Sugar-free or diet soda. Seltzer water. Lowfat or no-fat milk. Milk  alternatives, such as soy or almond milk. Fats and oils Olive oil. Canola oil. Sunflower oil. Grapeseed oil. Avocado. Walnuts. Sweets and desserts Sugar-free or low-fat pudding. Sugar-free or low-fat ice cream and other frozen treats. Seasoning and other foods Herbs. Sodium-free spices. Mustard. Relish. Low-fat, low-sugar ketchup. Low-fat, low-sugar barbecue sauce. Low-fat or fat-free mayonnaise. What foods are not recommended? The items listed below may not be a complete list. Talk with your dietitian about what dietary choices are best for you. Grains Refined white flour and flour products, such as bread, pasta, snack foods, and cereals. Vegetables Canned vegetables. Frozen vegetables with butter or cream sauce. Fruits Fruits canned with syrup. Meats and other protein foods Fatty cuts of meat. Poultry with skin. Breaded or fried meat. Processed meats. Dairy Full-fat yogurt, cheese, or milk. Beverages Sweetened drinks, such as sweet iced tea and soda. Fats and oils Butter. Lard. Ghee. Sweets and desserts Baked goods, such as cake, cupcakes, pastries, cookies, and cheesecake. Seasoning and other foods Spice mixes with added salt. Ketchup. Barbecue sauce. Mayonnaise. Summary  To prevent diabetes from developing, you may need to make diet and other lifestyle changes to help control blood sugar, improve cholesterol levels, and manage your blood pressure.  Set weight loss goals with the help of your health care team. It is recommended that most people with prediabetes lose 7 percent of their current body weight.  Consider following a Mediterranean diet that includes plenty of fresh fruits and vegetables, whole grains, beans, nuts, seeds, fish, lean meat, low-fat dairy, and healthy oils. This information is not intended to replace advice given to you by your health care provider. Make sure you discuss any questions you have with your health care provider. Document Revised: 07/11/2018  Document Reviewed: 05/23/2016 Elsevier Patient Education  2020 [employer].

## 2019-09-24 NOTE — Assessment & Plan Note (Signed)
A1C of 5.9 in February 2021, A1C of 6.1 today.  Encouraged to continue with regular activity, improving his diet. Handout provided.   Continue to monitor.

## 2019-10-09 ENCOUNTER — Other Ambulatory Visit: Payer: Self-pay | Admitting: Primary Care

## 2019-10-09 DIAGNOSIS — I10 Essential (primary) hypertension: Secondary | ICD-10-CM

## 2019-11-17 ENCOUNTER — Ambulatory Visit (INDEPENDENT_AMBULATORY_CARE_PROVIDER_SITE_OTHER): Payer: Medicare HMO | Admitting: Family Medicine

## 2019-11-17 ENCOUNTER — Encounter: Payer: Self-pay | Admitting: Family Medicine

## 2019-11-17 ENCOUNTER — Other Ambulatory Visit: Payer: Self-pay

## 2019-11-17 VITALS — BP 122/66 | HR 75 | Temp 97.4°F | Ht 72.0 in | Wt 262.1 lb

## 2019-11-17 DIAGNOSIS — R350 Frequency of micturition: Secondary | ICD-10-CM | POA: Diagnosis not present

## 2019-11-17 DIAGNOSIS — R351 Nocturia: Secondary | ICD-10-CM | POA: Insufficient documentation

## 2019-11-17 DIAGNOSIS — R202 Paresthesia of skin: Secondary | ICD-10-CM | POA: Insufficient documentation

## 2019-11-17 DIAGNOSIS — M5416 Radiculopathy, lumbar region: Secondary | ICD-10-CM | POA: Diagnosis not present

## 2019-11-17 DIAGNOSIS — G8929 Other chronic pain: Secondary | ICD-10-CM

## 2019-11-17 DIAGNOSIS — R109 Unspecified abdominal pain: Secondary | ICD-10-CM

## 2019-11-17 DIAGNOSIS — M545 Low back pain, unspecified: Secondary | ICD-10-CM

## 2019-11-17 HISTORY — DX: Paresthesia of skin: R20.2

## 2019-11-17 LAB — POC URINALSYSI DIPSTICK (AUTOMATED)
Bilirubin, UA: 1
Blood, UA: NEGATIVE
Glucose, UA: NEGATIVE
Ketones, UA: 5
Leukocytes, UA: NEGATIVE
Nitrite, UA: NEGATIVE
Protein, UA: POSITIVE — AB
Spec Grav, UA: 1.025 (ref 1.010–1.025)
Urobilinogen, UA: 0.2 E.U./dL
pH, UA: 6 (ref 5.0–8.0)

## 2019-11-17 NOTE — Assessment & Plan Note (Signed)
L flank pain- that traveled into low abd -now resolved  Suspect he passed a kidney stone Rev ua- disc imp of better water intake Will work on that  Urine culture sent as well to r/o infection  Also had some urinary frequency

## 2019-11-17 NOTE — Patient Instructions (Addendum)
Please aim for 64 oz of water per day if you can  Other fluids are ok - but we want mostly water   We will culture urine  I think you passed a stone    I placed a referral to orthopedics for your back  The office will call   Here are some stretches to try  Ice/heat are also a good idea

## 2019-11-17 NOTE — Assessment & Plan Note (Signed)
Now with more radicular symptoms  Ref done to f/u at [facility]  May need an injection

## 2019-11-17 NOTE — Assessment & Plan Note (Addendum)
Suspect this is the cause of his low back pain and R thigh tingling  Plans to f/u with orthopedics Rev last note from [facility] Epidural injections helped in the past  Ref done  Will use ice/heat  Handout for stretches given also

## 2019-11-17 NOTE — Assessment & Plan Note (Signed)
Suspect from radiculopathy

## 2019-11-17 NOTE — Progress Notes (Signed)
Subjective:    [name], male    DOB: [birth year], 75 y.o.   MRN: [id number]  This visit occurred during the SARS-CoV-2 public health emergency.  Safety protocols were in place, including screening questions prior to the visit, additional usage of staff PPE, and extensive cleaning of exam room while observing appropriate contact time as indicated for disinfecting solutions.    HPI Pt presents with back and flank pain/ urinary frequency  Dizziness L leg pain   Wt Readings from Last 3 Encounters:  11/17/19 262 lb 2 oz (118.9 kg)  09/24/19 274 lb 8 oz (124.5 kg)  06/04/19 281 lb 12 oz (127.8 kg)   35.55 kg/m   Saturday night- L flank pain-extended into his bladder  Very uncomfortable  Improved on Sunday  Did not see a stone  (has had kidney stone in distantly) No blood in urine Has been going frequently  Trying to drink a bunch of water   Also having L leg burning  Top of thigh -feels asleep /tingling or burning  Last night burned a lot   Has had L4-5 disc surgery -1989 Known lumbar deg disc dz  Had 2 injections - about a year ago  May be due for another       ua -dark Protein, ketones, bili  Results for orders placed or performed in visit on 11/17/19  POCT Urinalysis Dipstick (Automated)  Result Value Ref Range   Color, UA Dark Yellow    Clarity, UA Clear    Glucose, UA Negative Negative   Bilirubin, UA 1 mg/dL    Ketones, UA 5 mg/dL    Spec Grav, UA 1.025 1.010 - 1.025   Blood, UA Negative    pH, UA 6.0 5.0 - 8.0   Protein, UA Positive (A) Negative   Urobilinogen, UA 0.2 0.2 or 1.0 E.U./dL   Nitrite, UA Negative    Leukocytes, UA Negative Negative    For the past 2 days- getting 3 (16 oz) servings of water  Also drinks diet lime soda - 2 per day  1 cup of coffee in am    Patient Active Problem List   Diagnosis Date Noted  . Paresthesia of left leg 11/17/2019  . Flank pain 11/17/2019  . Urine frequency 11/17/2019  . Bilateral  lower extremity edema 06/04/2019  . Foul smelling urine 06/04/2019  . Chronic neck pain 06/04/2019  . Chronic right shoulder pain 06/04/2019  . Glaucoma (increased eye pressure) 04/28/2018  . Alcohol use disorder, severe, dependence ([place]) 04/18/2018  . Alcohol abuse 04/08/2018  . Preventative health care 10/25/2017  . Lumbar radiculopathy 07/30/2017  . Tremor 10/19/2016  . Chronic cough 05/25/2016  . Obesity (BMI 30.0-34.9) 01/10/2016  . Medicare annual wellness visit, subsequent 05/06/2015  . Depression 05/06/2015  . Prediabetes 09/01/2014  . Chronic back pain 09/20/2009  . VENTRAL HERNIA 10/27/2008  . Bilateral chronic knee pain 07/23/2008  . COPD (chronic obstructive pulmonary disease) ([place]) 10/02/2007  . Allergic rhinitis 07/21/2007  . Lipoma 12/24/2006  . Hyperlipidemia 09/30/2006  . CARPAL TUNNEL SYNDROME, BILATERAL 09/30/2006  . Essential hypertension 09/30/2006  . GERD 09/30/2006  . SKIN CANCER, HX OF 09/30/2006   Past Medical History:  Diagnosis Date  . Allergic rhinitis, cause unspecified   . Allergy   . Cancer (HCC)    Basal cell skin cancer,left ear  . Carpal tunnel syndrome   . Chronic airway obstruction, not elsewhere classified   . Esophageal reflux   . Glaucoma (  increased eye pressure)   . Lipoma of unspecified site   . Osteoarthrosis, unspecified whether generalized or localized, lower leg   . Other and unspecified hyperlipidemia   . Personal history of colonic polyps   . Personal history of other malignant neoplasm of skin   . Unspecified essential hypertension    Past Surgical History:  Procedure Laterality Date  . bone spur  06-2003   right thumb  . CATARACT EXTRACTION    . KNEE ARTHROSCOPY  09-2007   partial medial mesicecotmy, patellar chonfroplasty  . SHOULDER SURGERY  03-24-2007   removal bone spur  . SHOULDER SURGERY Left 07/2016   [place], Dr. Harmon Pier  . TOTAL KNEE ARTHROPLASTY     03-2009 [facility]  . TOTAL KNEE  ARTHROPLASTY Left 12/2012   [facility]   Social History   Tobacco Use  . Smoking status: Former Smoker    Packs/day: 3.00    Years: 25.00    Pack years: 75.00    Quit date: 11/23/1996    Years since quitting: 22.9  . Smokeless tobacco: Never Used  Vaping Use  . Vaping Use: Never used  Substance Use Topics  . Alcohol use: Not Currently    Alcohol/week: 0.0 standard drinks  . Drug use: No   Family History  Problem Relation Age of Onset  . Diabetes Mother   . Hyperlipidemia Mother   . Hypertension Mother   . Heart attack Mother   . Obesity Mother   . Hyperlipidemia Father   . Hypertension Father   . Lung cancer Father   . Colon polyps Father   . Hyperlipidemia Brother   . Hypertension Brother   . Hyperlipidemia Brother   . Hypertension Brother   . Hypertension Sister   . Hyperlipidemia Sister   . Lung cancer Sister   . Hyperlipidemia Sister   . Drug abuse Daughter   . Colon cancer Neg Hx   . Esophageal cancer Neg Hx   . Pancreatic cancer Neg Hx   . Rectal cancer Neg Hx    No Known Allergies Current Outpatient Medications on File Prior to Visit  Medication Sig Dispense Refill  . albuterol (PROVENTIL HFA;VENTOLIN HFA) 108 (90 Base) MCG/ACT inhaler Inhale 2 puffs into the lungs every 4 (four) hours as needed for wheezing or shortness of breath (cough, shortness of breath or wheezing.). 1 Inhaler 1  . amLODipine (NORVASC) 10 MG tablet TAKE 1 TABLET BY MOUTH EVERY DAY 90 tablet 1  . aspirin 81 MG tablet Take 81 mg by mouth daily.      Marland Kitchen atorvastatin (LIPITOR) 80 MG tablet TAKE 1 TABLET BY MOUTH EVERY DAY 90 tablet 1  . cetirizine (ZYRTEC) 10 MG tablet TAKE 1 TABLET BY MOUTH DAILY AS NEEDED FOR ALLERGIES 90 tablet 2  . fish oil-omega-3 fatty acids 1000 MG capsule Take 2 g by mouth daily.      Marland Kitchen losartan (COZAAR) 100 MG tablet TAKE 1 TABLET BY MOUTH EVERY DAY FOR BLOOD PRESSURE 90 tablet 1  . montelukast (SINGULAIR) 10 MG tablet TAKE 1 TABLET BY MOUTH EVERYDAY AT BEDTIME 90  tablet 3  . Multiple Vitamin (MULTIVITAMIN) tablet Take 1 tablet by mouth daily.      Marland Kitchen omeprazole (PRILOSEC) 20 MG capsule Take 20 mg by mouth daily.      . sertraline (ZOLOFT) 50 MG tablet TAKE 1 TABLET BY MOUTH EVERY DAY 90 tablet 1  . SYMBICORT 160-4.5 MCG/ACT inhaler INHALE 2 PUFFS INTO THE LUNGS TWICE A  DAY 30.6 Inhaler 1  . triamcinolone cream (KENALOG) 0.1 % Apply 1 application topically 2 (two) times daily. 30 g 0   Current Facility-Administered Medications on File Prior to Visit  Medication Dose Route Frequency Provider Last Rate Last Admin  . triamcinolone acetonide (KENALOG) 10 MG/ML injection 10 mg  10 mg Other Once Wallene Huh, DPM         Review of Systems  Constitutional: Negative for activity change, appetite change, fatigue, fever and unexpected weight change.  HENT: Negative for congestion, rhinorrhea, sore throat and trouble swallowing.   Eyes: Negative for pain, redness, itching and visual disturbance.  Respiratory: Negative for cough, chest tightness, shortness of breath and wheezing.   Cardiovascular: Negative for chest pain and palpitations.  Gastrointestinal: Negative for abdominal pain, blood in stool, constipation, diarrhea and nausea.  Endocrine: Negative for cold intolerance, heat intolerance, polydipsia and polyuria.  Genitourinary: Positive for frequency. Negative for difficulty urinating, dysuria, hematuria and urgency.  Musculoskeletal: Positive for back pain. Negative for arthralgias, joint swelling and myalgias.  Skin: Negative for pallor and rash.  Neurological: Positive for numbness. Negative for dizziness, tremors, weakness and headaches.  Hematological: Negative for adenopathy. Does not bruise/bleed easily.  Psychiatric/Behavioral: Negative for decreased concentration and dysphoric mood. The patient is not nervous/anxious.        Objective:   Physical Exam Constitutional:      General: He is not in acute distress.    Appearance: Normal  appearance. He is well-developed. He is obese. He is not ill-appearing or diaphoretic.  HENT:     Head: Normocephalic and atraumatic.     Mouth/Throat:     Mouth: Mucous membranes are moist.  Eyes:     General: No scleral icterus.    Conjunctiva/sclera: Conjunctivae normal.     Pupils: Pupils are equal, round, and reactive to light.  Neck:     Thyroid: No thyromegaly.     Vascular: No carotid bruit or JVD.  Cardiovascular:     Rate and Rhythm: Normal rate and regular rhythm.     Heart sounds: Normal heart sounds. No gallop.   Pulmonary:     Effort: Pulmonary effort is normal. No respiratory distress.     Breath sounds: Normal breath sounds. No wheezing or rales.  Abdominal:     General: Bowel sounds are normal. There is no distension or abdominal bruit.     Palpations: Abdomen is soft. There is no mass.     Tenderness: There is no abdominal tenderness. There is no right CVA tenderness, left CVA tenderness, guarding or rebound.     Hernia: No hernia is present.  Musculoskeletal:     Cervical back: Normal range of motion and neck supple.     Lumbar back: Spasms and tenderness present. No deformity. Decreased range of motion. Positive left straight leg raise test. Negative right straight leg raise test.     Comments: LS- pain to extend and bend laterally to L  Pain is worse with SLR on L  Some lower spinal tenderness as well as L lumbar spasm and muscular tenderness    Lymphadenopathy:     Cervical: No cervical adenopathy.  Skin:    General: Skin is warm and dry.     Findings: No rash.  Neurological:     Mental Status: He is alert.     Motor: No weakness.     Coordination: Coordination normal.     Gait: Gait normal.     Deep Tendon Reflexes: Reflexes  are normal and symmetric. Reflexes normal.  Psychiatric:        Mood and Affect: Mood normal.           Assessment & Plan:   Problem List Items Addressed This Visit      Nervous and Auditory   Lumbar radiculopathy -  Primary    Suspect this is the cause of his low back pain and R thigh tingling  Plans to f/u with orthopedics Rev last note from [facility] Epidural injections helped in the past  Ref done  Will use ice/heat  Handout for stretches given also      Relevant Orders   Ambulatory referral to Orthopedic Surgery     Other   Chronic back pain    Now with more radicular symptoms  Ref done to f/u at [facility]  May need an injection       Paresthesia of left leg    Suspect from radiculopathy      Flank pain    L flank pain- that traveled into low abd -now resolved  Suspect he passed a kidney stone Rev ua- disc imp of better water intake Will work on that  Urine culture sent as well to r/o infection  Also had some urinary frequency      Relevant Orders   Urine Culture   Urine frequency   Relevant Orders   Urine Culture    Other Visit Diagnoses    Urinary frequency       Relevant Orders   POCT Urinalysis Dipstick (Automated) (Completed)

## 2019-11-18 LAB — URINE CULTURE
MICRO NUMBER:: 10836619
SPECIMEN QUALITY:: ADEQUATE

## 2019-11-24 DIAGNOSIS — M5416 Radiculopathy, lumbar region: Secondary | ICD-10-CM | POA: Diagnosis not present

## 2019-12-03 DIAGNOSIS — M5416 Radiculopathy, lumbar region: Secondary | ICD-10-CM | POA: Diagnosis not present

## 2019-12-14 ENCOUNTER — Other Ambulatory Visit: Payer: Self-pay

## 2019-12-14 ENCOUNTER — Encounter: Payer: Self-pay | Admitting: Family Medicine

## 2019-12-14 ENCOUNTER — Ambulatory Visit (INDEPENDENT_AMBULATORY_CARE_PROVIDER_SITE_OTHER): Payer: Medicare HMO | Admitting: Family Medicine

## 2019-12-14 VITALS — BP 120/60 | HR 74 | Temp 97.9°F | Ht 72.0 in | Wt 264.2 lb

## 2019-12-14 DIAGNOSIS — M79642 Pain in left hand: Secondary | ICD-10-CM

## 2019-12-14 DIAGNOSIS — M19042 Primary osteoarthritis, left hand: Secondary | ICD-10-CM

## 2019-12-14 DIAGNOSIS — Z23 Encounter for immunization: Secondary | ICD-10-CM | POA: Diagnosis not present

## 2019-12-14 DIAGNOSIS — G8929 Other chronic pain: Secondary | ICD-10-CM | POA: Diagnosis not present

## 2019-12-14 MED ORDER — METHYLPREDNISOLONE ACETATE 40 MG/ML IJ SUSP
20.0000 mg | Freq: Once | INTRAMUSCULAR | Status: AC
  Administered 2019-12-14: 20 mg via INTRA_ARTICULAR

## 2019-12-14 NOTE — Patient Instructions (Signed)
OSTEOARTHRITIS: Over the counter  Tylenol: 2 tablets up to 3-4 times a day Regular NSAIDS are helpful (avoid in kidney disease and ulcers)  Supplements: Tart cherry juice and Curcumin (Turmeric extract) have good scientific evidence  - get the concentrated capsules or gelcaps over the counter so you do not get the calories from the juice.  Volaren 1% gel. Over the counter You can apply up to 4 times a day Minimal is absorbed in the bloodstream Cost is about 9 dollars

## 2019-12-14 NOTE — Addendum Note (Signed)
Addended by: Carter Kitten on: 12/14/2019 09:49 AM   Modules accepted: Orders

## 2019-12-14 NOTE — Progress Notes (Signed)
Alan Zeiter T. [name], MD, [place]  Primary Care and Sports Medicine [facility] at [facility] [place], [address]  Phone: [phone]  FAX: [phone]  CASE VASSELL - 74 y.o. male  MRN [id number]  Date of Birth: 02/15/1946  Date: 12/14/2019  PCP: Pleas Koch, NP  Referral: Pleas Koch, NP  Chief Complaint  Patient presents with  . Hand Pain    Left Index Finger    This visit occurred during the SARS-CoV-2 public health emergency.  Safety protocols were in place, including screening questions prior to the visit, additional usage of staff PPE, and extensive cleaning of exam room while observing appropriate contact time as indicated for disinfecting solutions.   Subjective:   Alan [name] is a 74 y.o. very pleasant male patient with Body mass index is 35.84 kg/m. who presents with the following:  Alan [name] is here with some ongoing left hand pain.  L 2nd digit: MCP and PIP joint, but the MCP joint is really bothering him. 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 PULM: Breathing comfortably in no respiratory distress PSYCH: Normally interactive.    L hand with diffuse OA changes, with focal pain at the 2nd MCP and PIP joints.  Worse at the MCP pain.  Radiology: No results found.  Assessment and Plan:     ICD-10-CM   1. OA (osteoarthritis) of finger, left  M19.042   2. Chronic hand pain, left  M79.642    G89.29   3. Need for influenza vaccination  Z23 Flu Vaccine QUAD High Dose(Fluad)   Exacerbation of finger OA  Aspiration/Injection  Procedure Note DURANTE VIOLETT [birth year] Date of procedure: 12/14/2019  Procedure: Small Joint Aspiration / Injection of L 2nd MCP Joint Indications: Pain  Procedure Details Verbal consent obtained from patient. Risks (including rare risk of infection, potential risk of bleeding, rare risk of tendon injury), benefits, and alternatives reviewed. Prepped with Chloraprep. Ethyl Chloride for anesthesia. Great toe held in traction. Under sterile conditions, needle inserted perpendicularly and 1/2 cc of Lidocaine 1% and Depo-Medrol 20 mg injected. No resistance met. No blood on aspiration. Decreased pain post-injection. Medication: Depo-Medrol 20 mg   Follow-up: No follow-ups on file.  No orders of the defined types were placed in this encounter.  There are no discontinued medications. Orders Placed This Encounter  Procedures  . Flu Vaccine QUAD High Dose(Fluad)    Signed,  Tramain Gershman T. Diallo Ponder, MD   Outpatient Encounter Medications as of 12/14/2019  Medication Sig  . albuterol (PROVENTIL HFA;VENTOLIN HFA) 108 (90 Base) MCG/ACT inhaler Inhale 2 puffs into the lungs every 4 (four) hours as needed for wheezing or shortness of breath (cough, shortness of breath or wheezing.).  Marland Kitchen amLODipine (NORVASC) 10 MG tablet TAKE 1 TABLET BY MOUTH EVERY DAY  . aspirin 81 MG tablet Take 81 mg by mouth daily.    Marland Kitchen atorvastatin (LIPITOR) 80 MG tablet TAKE 1 TABLET BY MOUTH EVERY DAY  . cetirizine (ZYRTEC) 10 MG tablet TAKE  1 TABLET BY MOUTH DAILY AS NEEDED FOR ALLERGIES  . fish oil-omega-3 fatty acids 1000 MG capsule Take 2 g by mouth daily.    Marland Kitchen losartan (COZAAR) 100 MG tablet TAKE 1 TABLET BY MOUTH EVERY DAY FOR BLOOD PRESSURE  . montelukast (SINGULAIR) 10 MG tablet TAKE 1 TABLET BY MOUTH EVERYDAY AT BEDTIME  . Multiple Vitamin (MULTIVITAMIN) tablet Take 1 tablet by mouth daily.    Marland Kitchen omeprazole (PRILOSEC) 20 MG capsule Take 20 mg by mouth daily.    . sertraline (ZOLOFT) 50 MG tablet TAKE 1 TABLET BY  MOUTH EVERY DAY  . SYMBICORT 160-4.5 MCG/ACT inhaler INHALE 2 PUFFS INTO THE LUNGS TWICE A DAY  . triamcinolone cream (KENALOG) 0.1 % Apply 1 application topically 2 (two) times daily.   Facility-Administered Encounter Medications as of 12/14/2019  Medication  . triamcinolone acetonide (KENALOG) 10 MG/ML injection 10 mg

## 2020-01-12 DIAGNOSIS — D1801 Hemangioma of skin and subcutaneous tissue: Secondary | ICD-10-CM | POA: Diagnosis not present

## 2020-01-12 DIAGNOSIS — L308 Other specified dermatitis: Secondary | ICD-10-CM | POA: Diagnosis not present

## 2020-01-12 DIAGNOSIS — L820 Inflamed seborrheic keratosis: Secondary | ICD-10-CM | POA: Diagnosis not present

## 2020-01-12 DIAGNOSIS — L821 Other seborrheic keratosis: Secondary | ICD-10-CM | POA: Diagnosis not present

## 2020-01-12 DIAGNOSIS — D225 Melanocytic nevi of trunk: Secondary | ICD-10-CM | POA: Diagnosis not present

## 2020-01-12 DIAGNOSIS — C44722 Squamous cell carcinoma of skin of right lower limb, including hip: Secondary | ICD-10-CM | POA: Diagnosis not present

## 2020-01-12 DIAGNOSIS — L814 Other melanin hyperpigmentation: Secondary | ICD-10-CM | POA: Diagnosis not present

## 2020-01-12 DIAGNOSIS — B078 Other viral warts: Secondary | ICD-10-CM | POA: Diagnosis not present

## 2020-01-12 DIAGNOSIS — D485 Neoplasm of uncertain behavior of skin: Secondary | ICD-10-CM | POA: Diagnosis not present

## 2020-01-28 DIAGNOSIS — B078 Other viral warts: Secondary | ICD-10-CM | POA: Diagnosis not present

## 2020-01-28 DIAGNOSIS — C44722 Squamous cell carcinoma of skin of right lower limb, including hip: Secondary | ICD-10-CM | POA: Diagnosis not present

## 2020-02-04 ENCOUNTER — Ambulatory Visit (INDEPENDENT_AMBULATORY_CARE_PROVIDER_SITE_OTHER): Payer: Medicare HMO | Admitting: Family Medicine

## 2020-02-04 ENCOUNTER — Other Ambulatory Visit: Payer: Self-pay

## 2020-02-04 ENCOUNTER — Encounter: Payer: Self-pay | Admitting: Family Medicine

## 2020-02-04 VITALS — BP 120/70 | HR 70 | Temp 97.8°F | Ht 72.0 in | Wt 263.0 lb

## 2020-02-04 DIAGNOSIS — M67952 Unspecified disorder of synovium and tendon, left thigh: Secondary | ICD-10-CM | POA: Diagnosis not present

## 2020-02-04 DIAGNOSIS — M48062 Spinal stenosis, lumbar region with neurogenic claudication: Secondary | ICD-10-CM

## 2020-02-04 DIAGNOSIS — M25552 Pain in left hip: Secondary | ICD-10-CM

## 2020-02-04 DIAGNOSIS — G8929 Other chronic pain: Secondary | ICD-10-CM | POA: Diagnosis not present

## 2020-02-04 MED ORDER — TRIAMCINOLONE ACETONIDE 40 MG/ML IJ SUSP
40.0000 mg | Freq: Once | INTRAMUSCULAR | Status: AC
  Administered 2020-02-04: 40 mg via INTRA_ARTICULAR

## 2020-02-04 NOTE — Progress Notes (Signed)
Alan Leu T. Jenese Mischke, MD, [place]  Primary Care and Sports Medicine [facility] at [facility] [place], [address]  Phone: [phone]  FAX: [phone]  Alan [name] - 74 y.o. male  MRN [id number]  Date of Birth: 02-Oct-1945  Date: 02/04/2020  PCP: Pleas Koch, NP  Referral: Pleas Koch, NP  Chief Complaint  Patient presents with  . Hip Pain    Left    This visit occurred during the SARS-CoV-2 public health emergency.  Safety protocols were in place, including screening questions prior to the visit, additional usage of staff PPE, and extensive cleaning of exam room while observing appropriate contact time as indicated for disinfecting solutions.   Subjective:   Alan [name] is a 74 y.o. very pleasant male patient with Body mass index is 35.67 kg/m. who presents with the following:  L posterior pelvis pain: He presents with pain in the posterior lateral pelvis, and this approaches the hip, but he denies groin pain, and he does not have any pain with manipulation. He does have pain approaching the caudal aspect of the femur as well. He has not had any specific trauma or injury. He has had problems with his back for years. On review of the patient's MRI:  This is of the lumbar spine approximately 2 years ago the patient did have significant spondyloarthropathy as well as spinal stenosis and foraminal stenosis.  Also pulled up the patient's hip radiographs from April 2019, and on my independent review there is no evidence of any joint space narrowing. No significant osteoarthritic changes whatsoever.  Some numbness at the lateral and posterior hip.   inj glen insertion on the l  Review of Systems is noted in the HPI, as appropriate   Objective:   BP 120/70   Pulse 70   Temp 97.8 F (36.6 C) (Temporal)   Ht 6' (1.829 m)   Wt 263 lb (119.3 kg)   SpO2 97%   BMI 35.67 kg/m    HIP EXAM: SIDE:  Left ROM: Abduction, Flexion, Internal and External range of motion: Full range of motion in all directions Pain with terminal IROM and EROM: Full range of motion in all directions GTB: Mildly tender to palpation He does have tenderness around the posterior pelvis on the left. This is also present in the caudal insertion at the femur with the gluteus medius and minimus. SLR: NEG Knees: No effusion FABER: NT REVERSE FABER: NT, neg Piriformis: NT at direct palpation Str: flexion: 5/5 abduction: 5/5, this does cause some pain adduction: 5/5   Radiology: No results found.  Assessment and Plan:     ICD-10-CM   1. Tendinopathy of left gluteus medius  M67.952   2. Chronic hip pain, left  M25.552 triamcinolone acetonide (KENALOG-40) injection 40 mg   G89.29   3. Spinal stenosis of lumbar region with neurogenic claudication  M48.062    He does not have primary intra-articular hip pathology. He does have some tendinopathy of the gluteus medius and minimus, and this is almost certainly caused by his chronic back pain and chronic spinal stenosis. Acute flare.  Basic activity and range of motion. I am also going to do at corticosteroid injection at his caudal and inferior insertions of the gluteus medius.  Aspiration/Injection Procedure Note GIONNI [name] 01-12-1946 Date of procedure: 02/04/2020  Procedure: Large Joint Aspiration / Injection of Hip, posterior pelvis, left Indications: Pain  Procedure Details Verbal consent obtained. Risks (  including infection, potential atrophy), benefits, and alternatives reviewed. Greater trochanter sterilely prepped with Chloraprep. Ethyl Chloride used for anesthesia. 9 cc of Lidocaine 1% injected with 1 mL of Kenalog 40 mg was fanned in the caudal and inferior attachment of the gluteus medius and minimus bursa. No bleeding and no complications. Decreased pain after injection. Needle: 22 gauge spinal needle, 3-1/2 inch Medication: 1 mL of Kenalog 40  mg   Meds ordered this encounter  Medications  . triamcinolone acetonide (KENALOG-40) injection 40 mg   There are no discontinued medications. No orders of the defined types were placed in this encounter.   Follow-up: No follow-ups on file.  Signed,  Maud Deed. Aodhan Scheidt, MD   Outpatient Encounter Medications as of 02/04/2020  Medication Sig  . albuterol (PROVENTIL HFA;VENTOLIN HFA) 108 (90 Base) MCG/ACT inhaler Inhale 2 puffs into the lungs every 4 (four) hours as needed for wheezing or shortness of breath (cough, shortness of breath or wheezing.).  Marland Kitchen amLODipine (NORVASC) 10 MG tablet TAKE 1 TABLET BY MOUTH EVERY DAY  . aspirin 81 MG tablet Take 81 mg by mouth daily.    Marland Kitchen atorvastatin (LIPITOR) 80 MG tablet TAKE 1 TABLET BY MOUTH EVERY DAY  . cetirizine (ZYRTEC) 10 MG tablet TAKE 1 TABLET BY MOUTH DAILY AS NEEDED FOR ALLERGIES  . fish oil-omega-3 fatty acids 1000 MG capsule Take 2 g by mouth daily.    Marland Kitchen losartan (COZAAR) 100 MG tablet TAKE 1 TABLET BY MOUTH EVERY DAY FOR BLOOD PRESSURE  . montelukast (SINGULAIR) 10 MG tablet TAKE 1 TABLET BY MOUTH EVERYDAY AT BEDTIME  . Multiple Vitamin (MULTIVITAMIN) tablet Take 1 tablet by mouth daily.    Marland Kitchen omeprazole (PRILOSEC) 20 MG capsule Take 20 mg by mouth daily.    . sertraline (ZOLOFT) 50 MG tablet TAKE 1 TABLET BY MOUTH EVERY DAY  . SYMBICORT 160-4.5 MCG/ACT inhaler INHALE 2 PUFFS INTO THE LUNGS TWICE A DAY  . triamcinolone cream (KENALOG) 0.1 % Apply 1 application topically 2 (two) times daily.   Facility-Administered Encounter Medications as of 02/04/2020  Medication  . triamcinolone acetonide (KENALOG) 10 MG/ML injection 10 mg  . [COMPLETED] triamcinolone acetonide (KENALOG-40) injection 40 mg

## 2020-02-09 DIAGNOSIS — R69 Illness, unspecified: Secondary | ICD-10-CM | POA: Diagnosis not present

## 2020-02-24 ENCOUNTER — Ambulatory Visit
Admission: EM | Admit: 2020-02-24 | Discharge: 2020-02-24 | Discharge status: Absent without leave | Payer: Medicare HMO | Attending: Family Medicine | Admitting: Family Medicine

## 2020-03-10 ENCOUNTER — Other Ambulatory Visit: Payer: Self-pay | Admitting: Primary Care

## 2020-03-10 DIAGNOSIS — F32A Depression, unspecified: Secondary | ICD-10-CM

## 2020-03-10 DIAGNOSIS — I10 Essential (primary) hypertension: Secondary | ICD-10-CM

## 2020-03-10 DIAGNOSIS — E782 Mixed hyperlipidemia: Secondary | ICD-10-CM

## 2020-04-04 ENCOUNTER — Other Ambulatory Visit: Payer: Self-pay | Admitting: Primary Care

## 2020-04-04 DIAGNOSIS — I10 Essential (primary) hypertension: Secondary | ICD-10-CM

## 2020-04-29 DIAGNOSIS — B078 Other viral warts: Secondary | ICD-10-CM | POA: Diagnosis not present

## 2020-04-29 DIAGNOSIS — L905 Scar conditions and fibrosis of skin: Secondary | ICD-10-CM | POA: Diagnosis not present

## 2020-04-29 DIAGNOSIS — Z85828 Personal history of other malignant neoplasm of skin: Secondary | ICD-10-CM | POA: Diagnosis not present

## 2020-05-17 ENCOUNTER — Telehealth: Payer: Self-pay | Admitting: Family Medicine

## 2020-05-17 DIAGNOSIS — G8929 Other chronic pain: Secondary | ICD-10-CM

## 2020-05-17 DIAGNOSIS — M19042 Primary osteoarthritis, left hand: Secondary | ICD-10-CM

## 2020-05-17 DIAGNOSIS — M79642 Pain in left hand: Secondary | ICD-10-CM

## 2020-05-17 NOTE — Telephone Encounter (Signed)
Patient called. Patient received a cortisone shot for index finger on left hand.  Patient said the cortisone shot didn't help at all.  Patient would like to be referred to the hand specialist at [place]. Patient can go anytime.

## 2020-05-18 NOTE — Telephone Encounter (Signed)
Left message for Shanon Brow with below information from Dr. Lorelei Pont.  I ask that he call us back to let us know if he prefers [facility] or [place] location.

## 2020-05-18 NOTE — Telephone Encounter (Signed)
Can you call  I was looking at my notes, and I think he should see an actual hand surgeon.  [facility] does not have one.  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  Would he rather go to [place] or [place]?

## 2020-05-18 NOTE — Telephone Encounter (Signed)
Pt called back in and wanted to let Butch Penny know he wants to go [facility] in [place] [address] [phone]

## 2020-06-10 DIAGNOSIS — M79642 Pain in left hand: Secondary | ICD-10-CM | POA: Diagnosis not present

## 2020-08-08 ENCOUNTER — Telehealth: Payer: Self-pay | Admitting: Primary Care

## 2020-08-08 DIAGNOSIS — J3089 Other allergic rhinitis: Secondary | ICD-10-CM

## 2020-08-08 NOTE — Telephone Encounter (Signed)
Patient is due for CPE/follow up now, this will be required prior to any further refills.  Please schedule.   

## 2020-08-09 DIAGNOSIS — M5416 Radiculopathy, lumbar region: Secondary | ICD-10-CM | POA: Diagnosis not present

## 2020-08-24 DIAGNOSIS — M5416 Radiculopathy, lumbar region: Secondary | ICD-10-CM | POA: Diagnosis not present

## 2020-08-24 DIAGNOSIS — M79645 Pain in left finger(s): Secondary | ICD-10-CM | POA: Diagnosis not present

## 2020-08-30 ENCOUNTER — Telehealth: Payer: Self-pay | Admitting: Primary Care

## 2020-08-30 NOTE — Telephone Encounter (Signed)
LM with patient's friend Dedra Skeens to have pt rtn my call.

## 2020-08-30 NOTE — Progress Notes (Signed)
    Korrina Zern T. Natividad Halls, MD, [place]  Primary Care and Sports Medicine [facility] at [facility] [place], [address]  Phone: [phone]  FAX: [phone]  Alan [name] - 75 y.o. male  MRN [id number]  Date of Birth: 1945-12-17  Date: 08/31/2020  PCP: Pleas Koch, NP  Referral: Pleas Koch, NP  Chief Complaint  Patient presents with  . joint injection    This visit occurred during the SARS-CoV-2 public health emergency.  Safety protocols were in place, including screening questions prior to the visit, additional usage of staff PPE, and extensive cleaning of exam room while observing appropriate contact time as indicated for disinfecting solutions.   Subjective:   Alan [name] is a 75 y.o. very pleasant male patient with Body mass index is 35.94 kg/m. who presents with the following:  L second digit concerns:  12/2019, I did a 2nd digit MCP joint injection.  Voltaren gel.  2nd MCP injection on the Left.  He did see Dr. Caralyn Guile in 05/2020.  Procedure only.  Aspiration/Injection Procedure Note Alan [name] 1946-02-21 Date of procedure: 08/31/2020  Procedure: Small Joint Aspiration / Injection of MCP joint, L 2nd Indications: Pain  Procedure Details Verbal consent was obtained. Risks benefits, and alternatives were discussed. Prepped with Chloraprep and Ethyl Chloride used for anesthesia. Under sterile conditions, patient injected into 2nd MCP joint. Inserted perpendicularly with traction placed on finger. Aspiration yields no blood. Decreased pain post injection. No complications. Needle size: 25 gauge Injection: 1/2 cc of Lidocaine 1% and Kenalog 20 mg Medication: 1/2 cc of Kenalog 40 mg (equaling Kenalog 20 mg)     ICD-10-CM   1. OA (osteoarthritis) of finger, left  M19.042 triamcinolone acetonide (KENALOG-40) injection 20 mg    Meds ordered this encounter  Medications  . triamcinolone  acetonide (KENALOG-40) injection 20 mg   Signed,  Fina Heizer T. Darline Faith, MD

## 2020-08-31 ENCOUNTER — Ambulatory Visit: Payer: Medicare HMO | Admitting: Family Medicine

## 2020-08-31 ENCOUNTER — Other Ambulatory Visit: Payer: Self-pay

## 2020-08-31 ENCOUNTER — Encounter: Payer: Self-pay | Admitting: Family Medicine

## 2020-08-31 VITALS — BP 118/62 | HR 69 | Temp 97.4°F | Ht 72.0 in | Wt 265.0 lb

## 2020-08-31 DIAGNOSIS — M19042 Primary osteoarthritis, left hand: Secondary | ICD-10-CM

## 2020-08-31 MED ORDER — TRIAMCINOLONE ACETONIDE 40 MG/ML IJ SUSP
20.0000 mg | Freq: Once | INTRAMUSCULAR | Status: AC
  Administered 2020-08-31: 20 mg via INTRA_ARTICULAR

## 2020-08-31 NOTE — Patient Instructions (Signed)
Alleve 2 tabs by mouth two times a day over the counter: Take at least for 2 - 3 weeks. This is equal to a prescripton strength dose (GENERIC CHEAPER EQUIVALENT IS NAPROXEN SODIUM)    

## 2020-09-01 NOTE — Telephone Encounter (Signed)
Left detailed message per dpr to call and schedule cpe with same day labs

## 2020-09-02 ENCOUNTER — Telehealth: Payer: Self-pay | Admitting: Primary Care

## 2020-09-02 NOTE — Telephone Encounter (Signed)
Called patient to discuss cpe scheduling. While on the phone with the patient he had asked about a prescription that he was supposed to get called in for him after his most recent visit with Dr Lorelei Pont on 08/31/20. I asked if he received the AVS and he said no. In looking into this request I asked him the name of the medicine that he was supposed to get so I could send a message. According to AVS he was to take ALLEVE over the counter. I advised that was all that I saw and I didn't see any other prescriptions written. Patient was not very happy with his recent visit in the "lack of communication " . Patient was very upset and thinking about changing practices as he is not happy. EM

## 2020-09-02 NOTE — Telephone Encounter (Signed)
Completed/ EM

## 2020-09-03 ENCOUNTER — Other Ambulatory Visit: Payer: Self-pay | Admitting: Primary Care

## 2020-09-03 DIAGNOSIS — J3089 Other allergic rhinitis: Secondary | ICD-10-CM

## 2020-09-15 ENCOUNTER — Other Ambulatory Visit: Payer: Self-pay | Admitting: Primary Care

## 2020-09-15 DIAGNOSIS — F32A Depression, unspecified: Secondary | ICD-10-CM

## 2020-09-15 DIAGNOSIS — E782 Mixed hyperlipidemia: Secondary | ICD-10-CM

## 2020-09-15 DIAGNOSIS — I10 Essential (primary) hypertension: Secondary | ICD-10-CM

## 2020-09-28 ENCOUNTER — Other Ambulatory Visit: Payer: Self-pay | Admitting: Primary Care

## 2020-09-28 DIAGNOSIS — I10 Essential (primary) hypertension: Secondary | ICD-10-CM

## 2020-10-05 NOTE — Telephone Encounter (Signed)
Noted, I have left a message through my chart offering to discuss this further with patient and offering my apologies for any perceived less than excellent care.  Patient can contact the office back and ask to speak with me if he would like to discuss further.

## 2020-10-05 NOTE — Telephone Encounter (Signed)
Patient called in a few weeks ago and expressed some dissatisfaction with his recent office visit.  He was not asking for a call back but I did want to check in with patient and offer to discuss any concerns that he may have and let him know that we always strive to be right here with our patients in whatever they need.

## 2020-10-17 ENCOUNTER — Other Ambulatory Visit: Payer: Self-pay | Admitting: Primary Care

## 2020-10-17 DIAGNOSIS — I10 Essential (primary) hypertension: Secondary | ICD-10-CM

## 2020-10-17 DIAGNOSIS — F32A Depression, unspecified: Secondary | ICD-10-CM

## 2020-10-17 DIAGNOSIS — E782 Mixed hyperlipidemia: Secondary | ICD-10-CM

## 2020-10-20 ENCOUNTER — Encounter: Payer: Medicare HMO | Admitting: Primary Care

## 2020-10-21 ENCOUNTER — Encounter: Payer: Self-pay | Admitting: Primary Care

## 2020-10-21 ENCOUNTER — Other Ambulatory Visit: Payer: Self-pay

## 2020-10-21 ENCOUNTER — Ambulatory Visit (INDEPENDENT_AMBULATORY_CARE_PROVIDER_SITE_OTHER): Payer: Medicare HMO | Admitting: Primary Care

## 2020-10-21 VITALS — BP 120/62 | HR 69 | Temp 97.8°F | Ht 72.0 in | Wt 261.0 lb

## 2020-10-21 DIAGNOSIS — F1011 Alcohol abuse, in remission: Secondary | ICD-10-CM

## 2020-10-21 DIAGNOSIS — R351 Nocturia: Secondary | ICD-10-CM

## 2020-10-21 DIAGNOSIS — I10 Essential (primary) hypertension: Secondary | ICD-10-CM

## 2020-10-21 DIAGNOSIS — Z0000 Encounter for general adult medical examination without abnormal findings: Secondary | ICD-10-CM

## 2020-10-21 DIAGNOSIS — Z125 Encounter for screening for malignant neoplasm of prostate: Secondary | ICD-10-CM

## 2020-10-21 DIAGNOSIS — E785 Hyperlipidemia, unspecified: Secondary | ICD-10-CM

## 2020-10-21 DIAGNOSIS — J3089 Other allergic rhinitis: Secondary | ICD-10-CM

## 2020-10-21 DIAGNOSIS — F3342 Major depressive disorder, recurrent, in full remission: Secondary | ICD-10-CM

## 2020-10-21 DIAGNOSIS — R7303 Prediabetes: Secondary | ICD-10-CM | POA: Diagnosis not present

## 2020-10-21 DIAGNOSIS — K219 Gastro-esophageal reflux disease without esophagitis: Secondary | ICD-10-CM | POA: Diagnosis not present

## 2020-10-21 DIAGNOSIS — J449 Chronic obstructive pulmonary disease, unspecified: Secondary | ICD-10-CM

## 2020-10-21 DIAGNOSIS — G8929 Other chronic pain: Secondary | ICD-10-CM

## 2020-10-21 DIAGNOSIS — R053 Chronic cough: Secondary | ICD-10-CM | POA: Diagnosis not present

## 2020-10-21 DIAGNOSIS — M545 Low back pain, unspecified: Secondary | ICD-10-CM

## 2020-10-21 DIAGNOSIS — M25562 Pain in left knee: Secondary | ICD-10-CM

## 2020-10-21 DIAGNOSIS — M25561 Pain in right knee: Secondary | ICD-10-CM

## 2020-10-21 LAB — LIPID PANEL
Cholesterol: 140 mg/dL (ref 0–200)
HDL: 52.9 mg/dL (ref >39.00)
LDL Cholesterol: 68 mg/dL (ref 0–99)
NonHDL: 87.5
Total CHOL/HDL Ratio: 3
Triglycerides: 100 mg/dL (ref 0.0–149.0)
VLDL: 20 mg/dL (ref 0.0–40.0)

## 2020-10-21 LAB — HEMOGLOBIN A1C: Hgb A1c MFr Bld: 6.1 % (ref 4.6–6.5)

## 2020-10-21 LAB — COMPREHENSIVE METABOLIC PANEL
ALT: 18 U/L (ref 0–53)
AST: 12 U/L (ref 0–37)
Albumin: 4.2 g/dL (ref 3.5–5.2)
Alkaline Phosphatase: 66 U/L (ref 39–117)
BUN: 17 mg/dL (ref 6–23)
CO2: 31 mEq/L (ref 19–32)
Calcium: 9.4 mg/dL (ref 8.4–10.5)
Chloride: 104 mEq/L (ref 96–112)
Creatinine, Ser: 0.82 mg/dL (ref 0.40–1.50)
GFR: 86.66 mL/min (ref >60.00)
Glucose, Bld: 109 mg/dL — ABNORMAL HIGH (ref 70–99)
Potassium: 4.4 mEq/L (ref 3.5–5.1)
Sodium: 141 mEq/L (ref 135–145)
Total Bilirubin: 0.5 mg/dL (ref 0.2–1.2)
Total Protein: 6.6 g/dL (ref 6.0–8.3)

## 2020-10-21 LAB — CBC
HCT: 40.6 % (ref 39.0–52.0)
Hemoglobin: 13.6 g/dL (ref 13.0–17.0)
MCHC: 33.4 g/dL (ref 30.0–36.0)
MCV: 87 fl (ref 78.0–100.0)
Platelets: 137 10*3/uL — ABNORMAL LOW (ref 150.0–400.0)
RBC: 4.67 Mil/uL (ref 4.22–5.81)
RDW: 13.9 % (ref 11.5–15.5)
WBC: 5.3 10*3/uL (ref 4.0–10.5)

## 2020-10-21 LAB — PSA, MEDICARE: PSA: 1.78 ng/ml (ref 0.10–4.00)

## 2020-10-21 MED ORDER — TAMSULOSIN HCL 0.4 MG PO CAPS: 0.4000 mg | ORAL_CAPSULE | Freq: Every day | ORAL | 0 refills | Status: DC

## 2020-10-21 NOTE — Assessment & Plan Note (Signed)
Stable. Denies concerns today. Encouraged weight loss.

## 2020-10-21 NOTE — Assessment & Plan Note (Signed)
Repeat A1C pending.  Discussed the importance of a healthy diet and regular exercise in order for weight loss, and to reduce the risk of further co-morbidity.  

## 2020-10-21 NOTE — Assessment & Plan Note (Signed)
Compliant to atorvastatin 80 mg, continue same.  Repeat lipid panel pending.

## 2020-10-21 NOTE — Assessment & Plan Note (Signed)
Well controlled in the office today.  Continue losartan 100 mg, amlodipine 10 mg.  Labs pending.

## 2020-10-21 NOTE — Assessment & Plan Note (Signed)
Denies concerns today. No cough on exam.

## 2020-10-21 NOTE — Assessment & Plan Note (Signed)
Immunizations UTD. PSA due and pending. Colonoscopy due, provided patient with phone number to schedule.  Discussed the importance of a healthy diet and regular exercise in order for weight loss, and to reduce the risk of further co-morbidity.  Exam today stable Labs pending.

## 2020-10-21 NOTE — Assessment & Plan Note (Signed)
Stable, doing well overall. Taking Singulair PRN. Continue same. No concerns today.

## 2020-10-21 NOTE — Assessment & Plan Note (Signed)
Stable, no concerns today. 

## 2020-10-21 NOTE — Patient Instructions (Signed)
Start tamsulosin 0.4 mg once daily for urine flow and frequency. Please update me in 3-4 weeks if no improvement.  Stop by the lab prior to leaving today. I will notify you of your results once received.   Call the GI office to schedule your colonoscopy.  It was a pleasure to see you today!  Preventive Care 75 Years and Older, Male Preventive care refers to lifestyle choices and visits with your health care provider that can promote health and wellness. This includes: A yearly physical exam. This is also called an annual wellness visit. Regular dental and eye exams. Immunizations. Screening for certain conditions. Healthy lifestyle choices, such as: Eating a healthy diet. Getting regular exercise. Not using drugs or products that contain nicotine and tobacco. Limiting alcohol use. What can I expect for my preventive care visit? Physical exam Your health care provider will check your: Height and weight. These may be used to calculate your BMI (body mass index). BMI is a measurement that tells if you are at a healthy weight. Heart rate and blood pressure. Body temperature. Skin for abnormal spots. Counseling Your health care provider may ask you questions about your: Past medical problems. Family's medical history. Alcohol, tobacco, and drug use. Emotional well-being. Home life and relationship well-being. Sexual activity. Diet, exercise, and sleep habits. History of falls. Memory and ability to understand (cognition). Work and work Statistician. Access to firearms. What immunizations do I need?  Vaccines are usually given at various ages, according to a schedule. Your health care provider will recommend vaccines for you based on your age, medicalhistory, and lifestyle or other factors, such as travel or where you work. What tests do I need? Blood tests Lipid and cholesterol levels. These may be checked every 5 years, or more often depending on your overall  health. Hepatitis C test. Hepatitis B test. Screening Lung cancer screening. You may have this screening every year starting at age 36 if you have a 30-pack-year history of smoking and currently smoke or have quit within the past 15 years. Colorectal cancer screening. All adults should have this screening starting at age 100 and continuing until age 64. Your health care provider may recommend screening at age 86 if you are at increased risk. You will have tests every 1-10 years, depending on your results and the type of screening test. Prostate cancer screening. Recommendations will vary depending on your family history and other risks. Genital exam to check for testicular cancer or hernias. Diabetes screening. This is done by checking your blood sugar (glucose) after you have not eaten for a while (fasting). You may have this done every 1-3 years. Abdominal aortic aneurysm (AAA) screening. You may need this if you are a current or former smoker. STD (sexually transmitted disease) testing, if you are at risk. Follow these instructions at home: Eating and drinking  Eat a diet that includes fresh fruits and vegetables, whole grains, lean protein, and low-fat dairy products. Limit your intake of foods with high amounts of sugar, saturated fats, and salt. Take vitamin and mineral supplements as recommended by your health care provider. Do not drink alcohol if your health care provider tells you not to drink. If you drink alcohol: Limit how much you have to 0-2 drinks a day. Be aware of how much alcohol is in your drink. In the [place] one drink equals one 12 oz bottle of beer (355 mL), one 5 oz glass of wine (148 mL), or one 1 oz glass of hard liquor (  44 mL).  Lifestyle Take daily care of your teeth and gums. Brush your teeth every morning and night with fluoride toothpaste. Floss one time each day. Stay active. Exercise for at least 30 minutes 5 or more days each week. Do not use any products  that contain nicotine or tobacco, such as cigarettes, e-cigarettes, and chewing tobacco. If you need help quitting, ask your health care provider. Do not use drugs. If you are sexually active, practice safe sex. Use a condom or other form of protection to prevent STIs (sexually transmitted infections). Talk with your health care provider about taking a low-dose aspirin or statin. Find healthy ways to cope with stress, such as: Meditation, yoga, or listening to music. Journaling. Talking to a trusted person. Spending time with friends and family. Safety Always wear your seat belt while driving or riding in a vehicle. Do not drive: If you have been drinking alcohol. Do not ride with someone who has been drinking. When you are tired or distracted. While texting. Wear a helmet and other protective equipment during sports activities. If you have firearms in your house, make sure you follow all gun safety procedures. What's next? Visit your health care provider once a year for an annual wellness visit. Ask your health care provider how often you should have your eyes and teeth checked. Stay up to date on all vaccines. This information is not intended to replace advice given to you by your health care provider. Make sure you discuss any questions you have with your healthcare provider. Document Revised: 12/16/2018 Document Reviewed: 03/13/2018 Elsevier Patient Education  2022 [employer].

## 2020-10-21 NOTE — Assessment & Plan Note (Signed)
Doing well on sertraline 50 mg, continue same.

## 2020-10-21 NOTE — Progress Notes (Signed)
Subjective:    [name], male    DOB: [birth year], 75 y.o.   MRN: [id number]  HPI  Alan [name] is a very pleasant 75 y.o. male who presents today for complete physical and follow up of chronic conditions.  Chronic urinary frequency with nocturia. Was on Flomax previously years ago, didn't notice improvement. Is now getting up to use the bathroom 3 times nightly, decreased flow during the day which comes and goes. He does snore lightly, has been told by prior people. He lives alone mostly.   Immunizations: -Tetanus: 2018 -Influenza: Due this season  -Covid-19: 4 vaccines -Shingles: Shingrix -Pneumonia: Prevnar 13 in 2016 and Pneumovax in 2013  Diet: [place].  Exercise: No regular exercise. Active in the garden.   Eye exam: Completes annually  Dental exam: Completes semi-annually   Colonoscopy: Completed in 2019, due August 2022 PSA: Due  BP Readings from Last 3 Encounters:  10/21/20 120/62  08/31/20 118/62  02/04/20 120/70       Review of Systems  Constitutional:  Negative for unexpected weight change.  HENT:  Negative for rhinorrhea.   Respiratory:  Negative for cough and shortness of breath.   Cardiovascular:  Negative for chest pain.  Gastrointestinal:  Negative for constipation and diarrhea.  Genitourinary:  Positive for difficulty urinating.       Nocturia  Musculoskeletal:  Positive for arthralgias. Negative for myalgias.  Skin:  Negative for rash.  Allergic/Immunologic: Positive for environmental allergies.  Neurological:  Negative for dizziness and headaches.  Psychiatric/Behavioral:  The patient is not nervous/anxious.         Past Medical History:  Diagnosis Date   Allergic rhinitis, cause unspecified    Allergy    Cancer ([place])    Basal cell skin cancer,left ear   Carpal tunnel syndrome    Chronic airway obstruction, not elsewhere classified    Esophageal reflux    Glaucoma (increased eye pressure)    Lipoma of  unspecified site    Osteoarthrosis, unspecified whether generalized or localized, lower leg    Other and unspecified hyperlipidemia    Personal history of colonic polyps    Personal history of other malignant neoplasm of skin    Unspecified essential hypertension     Social History   Socioeconomic History   Marital status: Divorced    Spouse name: Not on file   Number of children: 2   Years of education: Not on file   Highest education level: Not on file  Occupational History   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  Tobacco Use   Smoking status: Former    Packs/day: 3.00    Years: 25.00    Pack years: 75.00    Types: Cigarettes    Quit date: 11/23/1996    Years since quitting: 23.9   Smokeless tobacco: Never  Vaping Use   Vaping Use: Never used  Substance and Sexual Activity   Alcohol use: Not Currently    Alcohol/week: 0.0 standard drinks   Drug use: No   Sexual activity: Yes    Partners: Female    Comment: monogamous relationship  Other Topics Concern   Not on file  Social History Narrative   Regular exercise -- no            Social Determinants of Health   Financial Resource Strain: Not on file  Food Insecurity: Not on file  Transportation Needs: Not on file  Physical Activity: Not on file  Stress: Not on file  Social Connections: Not on file  Intimate Partner Violence: Not on file    Past Surgical History:  Procedure Laterality Date   bone spur  06-2003   right thumb   CATARACT EXTRACTION     KNEE ARTHROSCOPY  09-2007   partial medial mesicecotmy, patellar chonfroplasty   SHOULDER SURGERY  03-24-2007   removal bone spur   SHOULDER SURGERY Left 07/2016   [facility], Dr. Harmon Pier   TOTAL KNEE ARTHROPLASTY     03-2009 [facility]   TOTAL KNEE ARTHROPLASTY Left 12/2012   [facility]    Family History  Problem Relation Age of Onset   Diabetes Mother    Hyperlipidemia Mother    Hypertension Mother    Heart attack Mother    Obesity  Mother    Hyperlipidemia Father    Hypertension Father    Lung cancer Father    Colon polyps Father    Hyperlipidemia Brother    Hypertension Brother    Hyperlipidemia Brother    Hypertension Brother    Hypertension Sister    Hyperlipidemia Sister    Lung cancer Sister    Hyperlipidemia Sister    Drug abuse Daughter    Colon cancer Neg Hx    Esophageal cancer Neg Hx    Pancreatic cancer Neg Hx    Rectal cancer Neg Hx     No Known Allergies  Current Outpatient Medications on File Prior to Visit  Medication Sig Dispense Refill   albuterol (PROVENTIL HFA;VENTOLIN HFA) 108 (90 Base) MCG/ACT inhaler Inhale 2 puffs into the lungs every 4 (four) hours as needed for wheezing or shortness of breath (cough, shortness of breath or wheezing.). 1 Inhaler 1   amLODipine (NORVASC) 10 MG tablet TAKE 1 TABLET BY MOUTH EVERY DAY FOR BLOOD PRESSURE 30 tablet 0   atorvastatin (LIPITOR) 80 MG tablet TAKE 1 TABLET BY MOUTH EVERY DAY FOR CHOLESTEROL 30 tablet 0   cetirizine (ZYRTEC) 10 MG tablet TAKE 1 TABLET BY MOUTH DAILY AS NEEDED FOR ALLERGIES 90 tablet 2   fish oil-omega-3 fatty acids 1000 MG capsule Take 2 g by mouth daily.     losartan (COZAAR) 100 MG tablet TAKE 1 TABLET BY MOUTH EVERY DAY FOR BLOOD PRESSURE 30 tablet 0   montelukast (SINGULAIR) 10 MG tablet TAKE 1 TABLET (10 MG TOTAL) BY MOUTH AT BEDTIME. FOR ALLERGIES 90 tablet 0   Multiple Vitamin (MULTIVITAMIN) tablet Take 1 tablet by mouth daily.     omeprazole (PRILOSEC) 20 MG capsule Take 20 mg by mouth daily.     sertraline (ZOLOFT) 50 MG tablet TAKE 1 TABLET BY MOUTH EVERY DAY FOR DEPRESSION 90 tablet 0   SYMBICORT 160-4.5 MCG/ACT inhaler INHALE 2 PUFFS INTO THE LUNGS TWICE A DAY (Patient taking differently: As needed) 30.6 Inhaler 1   triamcinolone cream (KENALOG) 0.1 % Apply 1 application topically 2 (two) times daily. 30 g 0   Current Facility-Administered Medications on File Prior to Visit  Medication Dose Route Frequency Provider  Last Rate Last Admin   triamcinolone acetonide (KENALOG) 10 MG/ML injection 10 mg  10 mg Other Once Regal, Tamala Fothergill, DPM        BP 120/62   Pulse 69   Temp 97.8 F (36.6 C) (Temporal)   Ht 6' (1.829 m)   Wt 261 lb (118.4 kg)   SpO2 97%   BMI 35.40 kg/m  Objective:   Physical Exam HENT:     Right Ear: Tympanic membrane and ear canal normal.  Left Ear: Tympanic membrane and ear canal normal.     Nose: Nose normal.     Right Sinus: No maxillary sinus tenderness or frontal sinus tenderness.     Left Sinus: No maxillary sinus tenderness or frontal sinus tenderness.  Eyes:     Conjunctiva/sclera: Conjunctivae normal.  Neck:     Thyroid: No thyromegaly.     Vascular: No carotid bruit.  Cardiovascular:     Rate and Rhythm: Normal rate and regular rhythm.     Heart sounds: Normal heart sounds.  Pulmonary:     Effort: Pulmonary effort is normal.     Breath sounds: Normal breath sounds. No wheezing or rales.  Abdominal:     General: Bowel sounds are normal.     Palpations: Abdomen is soft.     Tenderness: There is no abdominal tenderness.  Musculoskeletal:        General: Normal range of motion.     Cervical back: Neck supple.  Skin:    General: Skin is warm and dry.  Neurological:     Mental Status: He is alert and oriented to person, place, and time.     Cranial Nerves: No cranial nerve deficit.     Deep Tendon Reflexes: Reflexes are normal and symmetric.  Psychiatric:        Mood and Affect: Mood normal.          Assessment & Plan:      This visit occurred during the SARS-CoV-2 public health emergency.  Safety protocols were in place, including screening questions prior to the visit, additional usage of staff PPE, and extensive cleaning of exam room while observing appropriate contact time as indicated for disinfecting solutions.

## 2020-10-21 NOTE — Assessment & Plan Note (Signed)
No recent use of Symbicort or albuterol inhalers. No concerns today, lungs clear.  Discussed to use Symbicort daily if needed, use albuterol PRN if needed. Will keep on medication list.

## 2020-10-21 NOTE — Assessment & Plan Note (Addendum)
Denies concerns today. Continue to monitor. No use of omeprazole.

## 2020-10-21 NOTE — Assessment & Plan Note (Signed)
Sober almost 2 years! Commended him on this!

## 2020-10-21 NOTE — Assessment & Plan Note (Signed)
Chronic for years, also now with decreased urine flow intermittently.   PSA pending. Will trial tamsulosin 0.4 mg daily. If no improvement consider other treatment, consider sleep study.  He will update.

## 2020-10-30 ENCOUNTER — Other Ambulatory Visit: Payer: Self-pay | Admitting: Primary Care

## 2020-10-30 DIAGNOSIS — I10 Essential (primary) hypertension: Secondary | ICD-10-CM

## 2020-11-07 ENCOUNTER — Telehealth: Payer: Self-pay

## 2020-11-07 ENCOUNTER — Emergency Department (HOSPITAL_COMMUNITY): Payer: Medicare HMO

## 2020-11-07 ENCOUNTER — Other Ambulatory Visit: Payer: Self-pay

## 2020-11-07 ENCOUNTER — Encounter (HOSPITAL_COMMUNITY): Payer: Self-pay

## 2020-11-07 ENCOUNTER — Emergency Department (HOSPITAL_COMMUNITY)
Admission: EM | Admit: 2020-11-07 | Discharge: 2020-11-07 | Disposition: A | Discharge status: Home / Self Care | Payer: Medicare HMO | Attending: Emergency Medicine | Admitting: Emergency Medicine

## 2020-11-07 DIAGNOSIS — R42 Dizziness and giddiness: Secondary | ICD-10-CM | POA: Insufficient documentation

## 2020-11-07 DIAGNOSIS — Z85828 Personal history of other malignant neoplasm of skin: Secondary | ICD-10-CM | POA: Diagnosis not present

## 2020-11-07 DIAGNOSIS — Z96652 Presence of left artificial knee joint: Secondary | ICD-10-CM | POA: Insufficient documentation

## 2020-11-07 DIAGNOSIS — H538 Other visual disturbances: Secondary | ICD-10-CM | POA: Diagnosis not present

## 2020-11-07 DIAGNOSIS — I10 Essential (primary) hypertension: Secondary | ICD-10-CM | POA: Insufficient documentation

## 2020-11-07 DIAGNOSIS — Z79899 Other long term (current) drug therapy: Secondary | ICD-10-CM | POA: Diagnosis not present

## 2020-11-07 DIAGNOSIS — R519 Headache, unspecified: Secondary | ICD-10-CM

## 2020-11-07 DIAGNOSIS — J449 Chronic obstructive pulmonary disease, unspecified: Secondary | ICD-10-CM | POA: Insufficient documentation

## 2020-11-07 DIAGNOSIS — Z87891 Personal history of nicotine dependence: Secondary | ICD-10-CM | POA: Diagnosis not present

## 2020-11-07 LAB — COMPREHENSIVE METABOLIC PANEL
ALT: 25 U/L (ref 0–44)
AST: 16 U/L (ref 15–41)
Albumin: 3.8 g/dL (ref 3.5–5.0)
Alkaline Phosphatase: 62 U/L (ref 38–126)
Anion gap: 6 (ref 5–15)
BUN: 14 mg/dL (ref 8–23)
CO2: 28 mmol/L (ref 22–32)
Calcium: 9.4 mg/dL (ref 8.9–10.3)
Chloride: 106 mmol/L (ref 98–111)
Creatinine, Ser: 0.73 mg/dL (ref 0.61–1.24)
GFR, Estimated: >60 mL/min (ref >60)
Glucose, Bld: 122 mg/dL — ABNORMAL HIGH (ref 70–99)
Potassium: 4.1 mmol/L (ref 3.5–5.1)
Sodium: 140 mmol/L (ref 135–145)
Total Bilirubin: 0.6 mg/dL (ref 0.3–1.2)
Total Protein: 7.2 g/dL (ref 6.5–8.1)

## 2020-11-07 LAB — CBC
HCT: 42.5 % (ref 39.0–52.0)
Hemoglobin: 13.7 g/dL (ref 13.0–17.0)
MCH: 28.9 pg (ref 26.0–34.0)
MCHC: 32.2 g/dL (ref 30.0–36.0)
MCV: 89.7 fL (ref 80.0–100.0)
Platelets: 159 10*3/uL (ref 150–400)
RBC: 4.74 MIL/uL (ref 4.22–5.81)
RDW: 12.9 % (ref 11.5–15.5)
WBC: 4.6 10*3/uL (ref 4.0–10.5)
nRBC: 0 % (ref 0.0–0.2)

## 2020-11-07 LAB — DIFFERENTIAL
Abs Immature Granulocytes: 0.01 10*3/uL (ref 0.00–0.07)
Basophils Absolute: 0 10*3/uL (ref 0.0–0.1)
Basophils Relative: 0 %
Eosinophils Absolute: 0.3 10*3/uL (ref 0.0–0.5)
Eosinophils Relative: 5 %
Immature Granulocytes: 0 %
Lymphocytes Relative: 37 %
Lymphs Abs: 1.7 10*3/uL (ref 0.7–4.0)
Monocytes Absolute: 0.4 10*3/uL (ref 0.1–1.0)
Monocytes Relative: 8 %
Neutro Abs: 2.3 10*3/uL (ref 1.7–7.7)
Neutrophils Relative %: 50 %

## 2020-11-07 LAB — PROTIME-INR
INR: 1 (ref 0.8–1.2)
Prothrombin Time: 13.1 seconds (ref 11.4–15.2)

## 2020-11-07 LAB — APTT: aPTT: 29 seconds (ref 24–36)

## 2020-11-07 MED ORDER — FLUORESCEIN SODIUM 1 MG OP STRP: 1.0000 | ORAL_STRIP | Freq: Once | OPHTHALMIC | Status: DC

## 2020-11-07 MED ORDER — TETRACAINE HCL 0.5 % OP SOLN: 1.0000 [drp] | Freq: Once | OPHTHALMIC | Status: DC

## 2020-11-07 MED ORDER — SODIUM CHLORIDE 0.9% FLUSH: 3.0000 mL | Freq: Once | INTRAVENOUS | Status: DC

## 2020-11-07 NOTE — ED Provider Notes (Signed)
Emergency Medicine Provider Triage Evaluation Note  Alan [name] , a 75 y.o. male  was evaluated in triage.  Pt complains of who presents with 1 week of lightheadedness, disequilibrium, and "swimmy headedness".  States he went to bed at 11 PM last night and we woke up today he had a large field of his vision that was cut, states it was "covered with a black cloud".  This resolved after a few hours but he does have some residual blurry vision and cloudiness in the nasal corner of his left visual field he is not anticoagulated..  Review of Systems  Positive: Vision loss, lightheadedness, disequilibrium Negative: Slurred speech, weakness, falls, syncope  Physical Exam  BP (!) 142/71   Pulse 64   Temp 98.5 F (36.9 C) (Oral)   Resp 14   SpO2 95%  Gen:   Awake, no distress   Resp:  Normal effort  MSK:   Moves extremities without difficulty  Other:  PERRL, EOMI.  Visual fields appear intact at this time.  No focal deficit on neurologic exam.  Medical Decision Making  Medically screening exam initiated at 12:19 PM.  Appropriate orders placed.  Alan [name] was informed that the remainder of the evaluation will be completed by another provider, this initial triage assessment does not replace that evaluation, and the importance of remaining in the ED until their evaluation is complete.  We will proceed with stroke work-up.  Patient does not meet VAN criteria at this time, will not activate code stroke.  This chart was dictated using voice recognition software, Dragon. Despite the best efforts of this provider to proofread and correct errors, errors may still occur which can change documentation meaning.    Alan [name] 11/07/20 1220    Alan Pick, MD 11/07/20 [phone]

## 2020-11-07 NOTE — Telephone Encounter (Signed)
Pt called asking to make an OV with a provider today. States he is having dizziness and lightheaded when he gets up or bends over. I offered a 4pm with Dr Silvio Pate and that if he worsens before that to go to the ER. He then said he will go on to the ER now. FYI ro PCP.

## 2020-11-07 NOTE — Telephone Encounter (Signed)
Noted, patient is currently at [facility].

## 2020-11-07 NOTE — ED Triage Notes (Signed)
Patient complains of 1 week of light headedness and dizziness. Patient denies cp and denies sob. This am awoke with cloud over left eye, still reports area in corner that is blurry

## 2020-11-07 NOTE — ED Provider Notes (Signed)
[facility] EMERGENCY DEPARTMENT Provider Note   CSN: [id number] Arrival date & time: 11/07/20  1142     History No chief complaint on file.   MANJINDER CARAWAY is a 75 y.o. male.  HPI  Patient is a 75 year old male with a past medical history of Hypertension, HLD, COPD that is presenting for a headache and lightheadedness.  Patient states that he has been having a headache on the left side of his head off and on for the last week.  Patient states that he has been helping his girlfriend move this week and when he bends down and looks up he becomes lightheaded.  He denies any weakness or numbness.  He currently feels at his mental baseline.  He denies any headache currently.    He states that this morning he woke up and had a visual disturbance in his left eye.  He states that the top half of his visual field went black and then he laid down.  When he got back up he said that the lower half of his visual field went black. When he got to the ED his vision returned to normal. He has no eye pain or visual disturbance at this time.      Past Medical History:  Diagnosis Date   Allergic rhinitis, cause unspecified    Allergy    Cancer ([place])    Basal cell skin cancer,left ear   Carpal tunnel syndrome    Chronic airway obstruction, not elsewhere classified    Esophageal reflux    Glaucoma (increased eye pressure)    Lipoma of unspecified site    Osteoarthrosis, unspecified whether generalized or localized, lower leg    Other and unspecified hyperlipidemia    Personal history of colonic polyps    Personal history of other malignant neoplasm of skin    Unspecified essential hypertension     Patient Active Problem List   Diagnosis Date Noted   Pain of left hand 06/10/2020   Paresthesia of left leg 11/17/2019   Nocturia 11/17/2019   Bilateral lower extremity edema 06/04/2019   Chronic neck pain 06/04/2019   Chronic right shoulder pain 06/04/2019   Glaucoma  (increased eye pressure) 04/28/2018   Glaucoma 04/28/2018   History of alcohol abuse 04/08/2018   Preventative health care 10/25/2017   Lumbar radiculopathy 07/30/2017   Tremor 10/19/2016   Chronic cough 05/25/2016   Obesity (BMI 30.0-34.9) 01/10/2016   Medicare annual wellness visit, subsequent 05/06/2015   Depression 05/06/2015   Prediabetes 09/01/2014   Chronic back pain 09/20/2009   VENTRAL HERNIA 10/27/2008   Bilateral chronic knee pain 07/23/2008   COPD (chronic obstructive pulmonary disease) ([place]) 10/02/2007   Allergic rhinitis 07/21/2007   Lipoma 12/24/2006   Hyperlipidemia 09/30/2006   CARPAL TUNNEL SYNDROME, BILATERAL 09/30/2006   Essential hypertension 09/30/2006   GERD 09/30/2006   SKIN CANCER, HX OF 09/30/2006    Past Surgical History:  Procedure Laterality Date   bone spur  06-2003   right thumb   CATARACT EXTRACTION     KNEE ARTHROSCOPY  09-2007   partial medial mesicecotmy, patellar chonfroplasty   SHOULDER SURGERY  03-24-2007   removal bone spur   SHOULDER SURGERY Left 07/2016   [facility], Dr. Harmon Pier   TOTAL KNEE ARTHROPLASTY     03-2009 [facility]   TOTAL KNEE ARTHROPLASTY Left 12/2012   [facility]       Family History  Problem Relation Age of Onset   Diabetes Mother  Hyperlipidemia Mother    Hypertension Mother    Heart attack Mother    Obesity Mother    Hyperlipidemia Father    Hypertension Father    Lung cancer Father    Colon polyps Father    Hyperlipidemia Brother    Hypertension Brother    Hyperlipidemia Brother    Hypertension Brother    Hypertension Sister    Hyperlipidemia Sister    Lung cancer Sister    Hyperlipidemia Sister    Drug abuse Daughter    Colon cancer Neg Hx    Esophageal cancer Neg Hx    Pancreatic cancer Neg Hx    Rectal cancer Neg Hx     Social History   Tobacco Use   Smoking status: Former    Packs/day: 3.00    Years: 25.00    Pack years: 75.00    Types: Cigarettes    Quit date:  11/23/1996    Years since quitting: 23.9   Smokeless tobacco: Never  Vaping Use   Vaping Use: Never used  Substance Use Topics   Alcohol use: Not Currently    Alcohol/week: 0.0 standard drinks   Drug use: No    Home Medications Prior to Admission medications   Medication Sig Start Date End Date Taking? Authorizing Provider  albuterol (PROVENTIL HFA;VENTOLIN HFA) 108 (90 Base) MCG/ACT inhaler Inhale 2 puffs into the lungs every 4 (four) hours as needed for wheezing or shortness of breath (cough, shortness of breath or wheezing.). 03/30/16   Elby Beck, FNP  amLODipine (NORVASC) 10 MG tablet TAKE 1 TABLET BY MOUTH EVERY DAY FOR BLOOD PRESSURE 10/17/20   Lesleigh Noe, MD  atorvastatin (LIPITOR) 80 MG tablet TAKE 1 TABLET BY MOUTH EVERY DAY FOR CHOLESTEROL 10/17/20   Lesleigh Noe, MD  cetirizine (ZYRTEC) 10 MG tablet TAKE 1 TABLET BY MOUTH DAILY AS NEEDED FOR ALLERGIES 11/18/18   Pleas Koch, NP  fish oil-omega-3 fatty acids 1000 MG capsule Take 2 g by mouth daily.    [provider]  losartan (COZAAR) 100 MG tablet TAKE 1 TABLET BY MOUTH EVERY DAY FOR BLOOD PRESSURE 10/30/20   Pleas Koch, NP  montelukast (SINGULAIR) 10 MG tablet TAKE 1 TABLET (10 MG TOTAL) BY MOUTH AT BEDTIME. FOR ALLERGIES 09/03/20   Pleas Koch, NP  Multiple Vitamin (MULTIVITAMIN) tablet Take 1 tablet by mouth daily.    [provider]  sertraline (ZOLOFT) 50 MG tablet TAKE 1 TABLET BY MOUTH EVERY DAY FOR DEPRESSION 10/17/20   Lesleigh Noe, MD  SYMBICORT 160-4.5 MCG/ACT inhaler INHALE 2 PUFFS INTO THE LUNGS TWICE A DAY Patient taking differently: As needed 07/31/18   Pleas Koch, NP  tamsulosin (FLOMAX) 0.4 MG CAPS capsule Take 1 capsule (0.4 mg total) by mouth daily. For urine flow and urine frequency 10/21/20   Pleas Koch, NP  triamcinolone cream (KENALOG) 0.1 % Apply 1 application topically 2 (two) times daily. 11/06/17   Pleas Koch, NP    Allergies     Patient has no known allergies.  Review of Systems   Review of Systems  Constitutional:  Negative for chills, diaphoresis, fatigue and fever.  HENT:  Negative for congestion, dental problem, ear pain, facial swelling, hearing loss, nosebleeds, postnasal drip, rhinorrhea, sore throat and trouble swallowing.   Eyes:  Negative for photophobia, pain and visual disturbance.  Respiratory:  Negative for apnea, cough, choking, chest tightness, shortness of breath, wheezing and stridor.   Cardiovascular:  Negative  for chest pain, palpitations and leg swelling.  Gastrointestinal:  Negative for abdominal distention, abdominal pain, constipation, diarrhea, nausea and vomiting.  Endocrine: Negative for polydipsia and polyuria.  Genitourinary:  Negative for difficulty urinating, dysuria, flank pain, frequency, hematuria and urgency.  Musculoskeletal:  Negative for gait problem, myalgias, neck pain and neck stiffness.  Skin:  Negative for rash and wound.  Allergic/Immunologic: Negative for environmental allergies and food allergies.  Neurological:  Positive for light-headedness and headaches. Negative for dizziness, tremors, seizures, syncope, facial asymmetry, speech difficulty and numbness.  Psychiatric/Behavioral:  Negative for behavioral problems and confusion.   All other systems reviewed and are negative.  Physical Exam Updated Vital Signs BP 108/88   Pulse 60   Temp 98 F (36.7 C) (Oral)   Resp 14   SpO2 98%   Physical Exam Vitals and nursing note reviewed.  Constitutional:      General: He is not in acute distress.    Appearance: Normal appearance. He is normal weight.  HENT:     Head: Normocephalic and atraumatic.     Right Ear: External ear normal.     Left Ear: External ear normal.     Nose: Nose normal. No congestion.     Mouth/Throat:     Mouth: Mucous membranes are moist.     Pharynx: Oropharynx is clear. No oropharyngeal exudate or posterior oropharyngeal erythema.  Eyes:      General: No visual field deficit.    Extraocular Movements: Extraocular movements intact.     Conjunctiva/sclera: Conjunctivae normal.     Pupils: Pupils are equal, round, and reactive to light.  Cardiovascular:     Rate and Rhythm: Normal rate and regular rhythm.     Pulses: Normal pulses.     Heart sounds: Normal heart sounds. No murmur heard.   No friction rub. No gallop.  Pulmonary:     Effort: Pulmonary effort is normal. No respiratory distress.     Breath sounds: Normal breath sounds. No stridor. No wheezing, rhonchi or rales.  Chest:     Chest wall: No tenderness.  Abdominal:     General: Abdomen is flat. Bowel sounds are normal. There is no distension.     Palpations: Abdomen is soft.     Tenderness: There is no abdominal tenderness. There is no right CVA tenderness, left CVA tenderness, guarding or rebound.  Musculoskeletal:        General: No swelling or tenderness. Normal range of motion.     Cervical back: Normal range of motion and neck supple. No rigidity, tenderness or bony tenderness.     Thoracic back: Normal. No tenderness or bony tenderness.     Lumbar back: Normal. No tenderness or bony tenderness.     Right lower leg: No edema.     Left lower leg: No edema.  Skin:    General: Skin is warm and dry.  Neurological:     General: No focal deficit present.     Mental Status: He is alert and oriented to person, place, and time. Mental status is at baseline.     Cranial Nerves: Cranial nerves are intact. No cranial nerve deficit, dysarthria or facial asymmetry.     Sensory: Sensation is intact. No sensory deficit.     Motor: Motor function is intact. No weakness.     Coordination: Coordination is intact. Finger-Nose-Finger Test normal.     Gait: Gait is intact. Gait normal.  Psychiatric:        Mood and Affect:  Mood normal.        Behavior: Behavior normal.        Thought Content: Thought content normal.        Judgment: Judgment normal.    ED Results /  Procedures / Treatments   Labs (all labs ordered are listed, but only abnormal results are displayed) Labs Reviewed  COMPREHENSIVE METABOLIC PANEL - Abnormal; Notable for the following components:      Result Value   Glucose, Bld 122 (*)    All other components within normal limits  PROTIME-INR  APTT  CBC  DIFFERENTIAL    EKG EKG Interpretation  Date/Time:  Monday November 07 2020 12:06:32 EDT Ventricular Rate:  65 PR Interval:  198 QRS Duration: 88 QT Interval:  394 QTC Calculation: 409 R Axis:   9 Text Interpretation: Normal sinus rhythm Normal ECG No significant change since last tracing Confirmed by Isla Pence [phone]) on 11/07/2020 7:32:30 PM  Radiology CT HEAD WO CONTRAST  Result Date: 11/07/2020 CLINICAL DATA:  Nonspecific dizziness. EXAM: CT HEAD WITHOUT CONTRAST TECHNIQUE: Contiguous axial images were obtained from the base of the skull through the vertex without intravenous contrast. COMPARISON:  03/20/2012 FINDINGS: Brain: The brain shows a normal appearance without evidence of malformation, atrophy, old or acute small or large vessel infarction, mass lesion, hemorrhage, hydrocephalus or extra-axial collection. Vascular: There is atherosclerotic calcification of the major vessels at the base of the brain. Skull: Normal.  No traumatic finding.  No focal bone lesion. Sinuses/Orbits: Sinuses are clear. Orbits appear normal. Mastoids are clear. Other: None significant IMPRESSION: Normal head CT for age.  No abnormality seen to explain dizziness. Electronically Signed   By: Nelson Chimes M.D.   On: 11/07/2020 14:06    Procedures Procedures   Medications Ordered in ED Medications  sodium chloride flush (NS) 0.9 % injection 3 mL (has no administration in time range)  fluorescein ophthalmic strip 1 strip (has no administration in time range)  tetracaine (PONTOCAINE) 0.5 % ophthalmic solution 1 drop (has no administration in time range)    ED Course  I have reviewed the  triage vital signs and the nursing notes.  Pertinent labs & imaging results that were available during my care of the patient were reviewed by me and considered in my medical decision making (see chart for details).    MDM Rules/Calculators/A&P                         XIOMAR ROERING is a 75 y.o. male ith a past medical history of Hypertension, HLD, COPD that is presenting for a headache and lightheadedness.  Patient is hemodynamically stable and in no acute distress.  Patient endorsed visual disturbances this morning that has since resolved.  His neurological exam is notable for no focal neurodeficits.  He has no change in motor or sensation.  Patient denies any weakness. He is able to ambulate without complications.  Patient states that he is at his mental baseline at this time.  Patient's visual acuity is intact.  He has no pain with extraocular movements.  He denies any eye drainage.  Patient denies any chest pain or shortness of breath.  Patient's labs are unremarkable.  CT head showed no acute intracranial abnormality.  EKG showed no acute signs of ischemia.  Patient does endorses strenuous activity and decreased p.o. intake this week.  His lightheadedness is most likely in the setting of dehydration.  Patient is going to follow up with  Dr. Katy Fitch, his ophthalmologist and PCP.   Patient states compliance and understanding of the plan. I explained labs and imaging to the patient. No further questions at this time from the patient.  The patient is safe and stable for discharge at this time with return precautions provided and a plan for follow-up care in place as needed.  The plan for this patient was discussed with Dr. Gilford Raid, who voiced agreement and who oversaw evaluation and treatment of this patient.   Final Clinical Impression(s) / ED Diagnoses Final diagnoses:  Lightheadedness  Nonintractable headache, unspecified chronicity pattern, unspecified headache type    Rx / DC  Orders ED Discharge Orders     None        Doretha Sou, MD 11/07/20 2029    Isla Pence, MD 11/07/20 2032

## 2020-11-07 NOTE — Discharge Instructions (Addendum)
Please follow up with your PCP in the next week and Dr. Katy Fitch for reevaluation. Thank you for letting us care for you today. Please return to the ED if you have vision loss, chest pain, shortness of breath or continued lightheadedness.

## 2020-11-08 ENCOUNTER — Telehealth: Payer: Self-pay | Admitting: Primary Care

## 2020-11-08 DIAGNOSIS — H43813 Vitreous degeneration, bilateral: Secondary | ICD-10-CM | POA: Diagnosis not present

## 2020-11-08 DIAGNOSIS — H34212 Partial retinal artery occlusion, left eye: Secondary | ICD-10-CM | POA: Diagnosis not present

## 2020-11-08 DIAGNOSIS — Z961 Presence of intraocular lens: Secondary | ICD-10-CM | POA: Diagnosis not present

## 2020-11-08 DIAGNOSIS — H539 Unspecified visual disturbance: Secondary | ICD-10-CM

## 2020-11-08 DIAGNOSIS — G453 Amaurosis fugax: Secondary | ICD-10-CM | POA: Diagnosis not present

## 2020-11-08 DIAGNOSIS — R42 Dizziness and giddiness: Secondary | ICD-10-CM

## 2020-11-08 DIAGNOSIS — H40013 Open angle with borderline findings, low risk, bilateral: Secondary | ICD-10-CM | POA: Diagnosis not present

## 2020-11-08 DIAGNOSIS — H16223 Keratoconjunctivitis sicca, not specified as Sjogren's, bilateral: Secondary | ICD-10-CM | POA: Diagnosis not present

## 2020-11-08 NOTE — Telephone Encounter (Signed)
Summer called in for Alan [name] stated that he has vision changes in his left eye, went to [facility] and he had some testing and to follow up with Dr. 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 Dr. Katy Fitch recommends a carotid ultrasound , ekg, and consider doing a CTA.  And Dr. Katy Fitch provided his cell phone number just in case [name] wanted to discuss it [phone]

## 2020-11-08 NOTE — Telephone Encounter (Signed)
Spoke with Dr. 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 Given patient symptoms coupled with findings it's possible he's had several prior strokes. ECG with NSR in ED from yesterday.   We will obtain CTA head and neck for evaluation as CT head was negative. Also need stat Echocardiogram. Need this done stat. Orders placed for [place] [facility].  Joellen, call patient, let him know that I spoke with his eye doctor, I have ordered testing that is important to have done. Is he taking aspirin 81 mg? If not then needs to start.   Ashtyn, can we get these CT scans done ASAP?

## 2020-11-08 NOTE — Addendum Note (Signed)
Addended by: Pleas Koch on: 11/08/2020 06:15 PM   Modules accepted: Orders

## 2020-11-09 ENCOUNTER — Other Ambulatory Visit: Payer: Self-pay | Admitting: Primary Care

## 2020-11-09 DIAGNOSIS — R42 Dizziness and giddiness: Secondary | ICD-10-CM

## 2020-11-09 DIAGNOSIS — H539 Unspecified visual disturbance: Secondary | ICD-10-CM

## 2020-11-09 DIAGNOSIS — H34212 Partial retinal artery occlusion, left eye: Secondary | ICD-10-CM

## 2020-11-09 NOTE — Telephone Encounter (Signed)
Working on precerts  Should be able to scan today or tomorrow if approved   Will follow up shortly

## 2020-11-09 NOTE — Telephone Encounter (Signed)
Called patient informed of information. He is not on Asprin but will start 81 mg Asprin  as advised. Informed him that he will be getting a call with times of CT. He has agreed to testing as advised.

## 2020-11-10 NOTE — Telephone Encounter (Signed)
Patient scanned last night Nothing further needed.

## 2020-11-11 ENCOUNTER — Other Ambulatory Visit: Payer: Self-pay

## 2020-11-11 ENCOUNTER — Telehealth: Payer: Self-pay | Admitting: Family Medicine

## 2020-11-11 ENCOUNTER — Ambulatory Visit
Admission: RE | Admit: 2020-11-11 | Discharge: 2020-11-11 | Disposition: A | Discharge status: Home / Self Care | Payer: Medicare HMO | Source: Ambulatory Visit | Attending: Primary Care | Admitting: Primary Care

## 2020-11-11 DIAGNOSIS — H34212 Partial retinal artery occlusion, left eye: Secondary | ICD-10-CM

## 2020-11-11 DIAGNOSIS — H539 Unspecified visual disturbance: Secondary | ICD-10-CM

## 2020-11-11 DIAGNOSIS — R42 Dizziness and giddiness: Secondary | ICD-10-CM | POA: Diagnosis not present

## 2020-11-11 DIAGNOSIS — H547 Unspecified visual loss: Secondary | ICD-10-CM

## 2020-11-11 NOTE — Telephone Encounter (Signed)
Noted.  Per report no new/ongoing sx now.  Thanks.

## 2020-11-11 NOTE — Telephone Encounter (Signed)
See my note below re: referral and update patient.  Thanks.

## 2020-11-11 NOTE — Telephone Encounter (Signed)
Precerts done for both  Both are pending with [employer] - clinical Review Approval most likely not until Monday 11/14/20 Once approval is received we can schedule the patient.   I have attempted to call the patient twice and I am unable to reach  "Unrecognized number" - I think since I am remote and calling from my personal number that its blocking it on the patients end.   Please notify the patient of that status of his precerts per Dr Damita Dunnings. Thanks.

## 2020-11-11 NOTE — Telephone Encounter (Signed)
Please notify pt that his insurance has delayed his care.  We have done all we can to get his CT approved.  I support him preemptively going to the ER to get imaging done.  That is the best option I can give at this point.  Thanks.

## 2020-11-11 NOTE — Telephone Encounter (Signed)
Patient called and informed him that if he has any neurological symptoms or any vision changes that he is to go to the emergency room, the patient stated unless he was dying he would then go since he was there a couple days ago but was there for 9 hours and was not helpful. Patient stated understanding and appreciation for the call.

## 2020-11-11 NOTE — Telephone Encounter (Signed)
Noted.  I will defer to patient.  And I'll await the outpatient imaging results, pending approval from his insurance.

## 2020-11-11 NOTE — Telephone Encounter (Signed)
Call pt.  D/w Dr. Nevada Crane with radiology.  Abnormal L carotid artery.    If any persistent or worsening neurologic/visual change, then go to ER ASAP.  If no symptoms at all, then needs stat CT neck and head to eval vessels for partial occlusion.   I put in the order for the scan.  Please check with referrals about getting this scheduled.   Needs neurology referral, I put in the order.  Thanks.   Routed to PCP as FYI.

## 2020-11-11 NOTE — Telephone Encounter (Signed)
This has to be precerted Will update as soon as this is approved.   Both CTA Head and Neck are pending clinical review with [employer].  Once approval is received we will schedule his appts

## 2020-11-11 NOTE — Telephone Encounter (Addendum)
It was recommended to see neurology.  I stand by that recommendation.  He may need vascular referral but I would get the imaging done first.  We have been working throughout the morning to arrange that.  His insurance company's precert process has delayed his scheduling.    I will defer any vascular referral to PCP/after imaging is done.  If we sent him to vascular with stat referral, he would still need imaging done so I would get that done first.    If any new neuro/vision sx, then to ER ASAP.  I can't comment on his approval of [place].  I have worked diligently on his behalf this AM.  Thanks.

## 2020-11-11 NOTE — Telephone Encounter (Signed)
Pt called back to ask what his limitations are with the carotid artery. I told I did not think he has any "limitations" but don't go out getting really hot and worked up but don't wait at home not doing anything. I read what the message was from Dr Damita Dunnings, again. He said he would like a referral to vascular surgeon ASAP and would not go to neurology until the carotid artery issue is addressed. He went on to say he was not Happy with [place] but would address that on another day.

## 2020-11-11 NOTE — Telephone Encounter (Signed)
I called and spoke with the patent and informed him that the insurance is delaying his CT scan and he could go to the ER for the scan and the patient stated he did not want to go to the ER, he has reached out to his insurance and is working on it and he would rather wait until Monday if necessary.  Alan [name],cma

## 2020-11-12 ENCOUNTER — Other Ambulatory Visit: Payer: Self-pay | Admitting: Family Medicine

## 2020-11-12 DIAGNOSIS — E782 Mixed hyperlipidemia: Secondary | ICD-10-CM

## 2020-11-12 DIAGNOSIS — I10 Essential (primary) hypertension: Secondary | ICD-10-CM

## 2020-11-14 ENCOUNTER — Telehealth: Payer: Self-pay | Admitting: Family Medicine

## 2020-11-14 ENCOUNTER — Ambulatory Visit (HOSPITAL_COMMUNITY)
Admission: RE | Admit: 2020-11-14 | Discharge: 2020-11-14 | Disposition: A | Discharge status: Home / Self Care | Payer: Medicare HMO | Source: Ambulatory Visit | Attending: Primary Care | Admitting: Primary Care

## 2020-11-14 ENCOUNTER — Other Ambulatory Visit: Payer: Self-pay

## 2020-11-14 DIAGNOSIS — H34212 Partial retinal artery occlusion, left eye: Secondary | ICD-10-CM | POA: Insufficient documentation

## 2020-11-14 DIAGNOSIS — I6522 Occlusion and stenosis of left carotid artery: Secondary | ICD-10-CM

## 2020-11-14 DIAGNOSIS — R42 Dizziness and giddiness: Secondary | ICD-10-CM | POA: Insufficient documentation

## 2020-11-14 DIAGNOSIS — H539 Unspecified visual disturbance: Secondary | ICD-10-CM | POA: Insufficient documentation

## 2020-11-14 NOTE — Progress Notes (Signed)
VASCULAR LAB    Carotid duplex has been performed.  See CV proc for preliminary results.   Dennies Coate, RVT 11/14/2020, 1:45 PM

## 2020-11-14 NOTE — Telephone Encounter (Signed)
Called on call at [facility] in [place] given findings on carotid US and Hollenhorst plaque in eye.   Stat referral placed.   Attempted to call patient to notify of referral and results but got voicemail. Left voicemail advising he check mychart.   Will also route to referral coordinator to see if CTA can be done now with more information available.

## 2020-11-15 ENCOUNTER — Other Ambulatory Visit: Payer: Medicare HMO

## 2020-11-15 ENCOUNTER — Inpatient Hospital Stay: Admission: RE | Admit: 2020-11-15 | Payer: Medicare HMO | Source: Ambulatory Visit

## 2020-11-15 NOTE — Telephone Encounter (Signed)
Will leave for PCP to discuss.    Unfortunately insurance has been not helpful resulting in different tests and delay in appropriate testing.   With his clinical findings standard of care is an MRI.   The MRI found an abnormal blood vessel and standard of care is the CTA Neck/head to evaluate further. Unfortunately insurance did not want do this so an ultrasound was performed. This test is not as accurate and in discussion with the oncall vascular surgeon they felt getting the CTA neck asap would help decision making and next steps for his office visit.   It does seem reasonable for him to wait for his vascular appointment, however, this could just delay decisions as they may still advise getting the CTA neck.   Appreciate the hard work of Teaching laboratory technician.

## 2020-11-15 NOTE — Telephone Encounter (Signed)
Patient is scheduled for CTA Head/Neck 11/15/20 at 2:30, arrive by 2:15 No food 2 hours prior. Drink lots of water all morning up to the appointment

## 2020-11-15 NOTE — Telephone Encounter (Signed)
Referral sent to [facility] - they schedule their own appts.  Pt can call [phone] if not heard anything before the end of this week.  [facility] [place] was approved Ready to schedule

## 2020-11-15 NOTE — Telephone Encounter (Signed)
Left general message on answering machine asking patient to please call me back in the am as office is now closed.   I did review care path with Dr. Einar Pheasant and am very worried about patient.  He needs urgent care and attention for his high CVA risk.  Will discuss further with patient when he calls back.  Appears not only has he cancelled his CTA but he has also cancelled his appointment with [facility].

## 2020-11-15 NOTE — Telephone Encounter (Signed)
Called patient to discuss appt information and referral.   Gave referral information - he is aware that [facility] have the referral and will call him to schedule as soon as they review it. Patient given [facility] [place] phone number to call if he has not heard anything within 24hrs.   While giving the CTA Head/Neck information the patient started hollering and cursing - not understanding why he is needing all these scans - states that he does not want it because he feels like it is not needed. He stated what he needs is the appt with [facility] - which is being coordinated.   Pt states that he has already had a CT Head and MRI and is not understanding why we are wanting him to have yet another scan. I tried to explain the difference between the tests and that certain tests are recommended based on findings from another test.  Patient went on stating that he "is done with [facility] and once he gets this issue figured out he is done". He states that he is not happy with his care provided and that he feels like "the right hand doesn't know what the left hand is doing."  I apologized for any confusion he is feeling and if he feels information has been left out, I reassured him that all Providers involved have his best interest at heart and are working hard and also by the recommendations of the Radiologist to get appropriate scanning and treatment.   Appt cancelled for today for CTA Head/Neck Pt is requesting a call ASAP to discuss everything - pt wants to understand why he needs all these tests.   Pt did apologize for raising his voice.   Will send to PCP to address    [place]

## 2020-11-16 ENCOUNTER — Other Ambulatory Visit: Payer: Self-pay

## 2020-11-16 ENCOUNTER — Encounter: Payer: Self-pay | Admitting: Vascular Surgery

## 2020-11-16 ENCOUNTER — Ambulatory Visit (INDEPENDENT_AMBULATORY_CARE_PROVIDER_SITE_OTHER): Payer: Medicare HMO | Admitting: Vascular Surgery

## 2020-11-16 VITALS — BP 138/83 | HR 65 | Temp 98.0°F | Resp 20 | Ht 72.0 in | Wt 261.0 lb

## 2020-11-16 DIAGNOSIS — I6522 Occlusion and stenosis of left carotid artery: Secondary | ICD-10-CM | POA: Diagnosis not present

## 2020-11-16 NOTE — Telephone Encounter (Signed)
Patient returned my call.  He states that he is considering changing doctor's offices as he feels "after 1 week, I am still in the same place I was last week, I don't think the right hand knows what the left hand is doing".    I did apologize that this was his perception and did attempt to explain to him in a summed up version why Dr.Duncan, [name] and Dr. Einar Pheasant have all played a part in getting him much needed care.  I informed him that everyone worked together to be sure he got the best, timeliest care for his very serious, life threatening health problem.  Reiterating that our top concern was getting him the care he needed as quick as possible. I did mention to him that things were a bit more complicated with his PCP [place] but that is why the other providers worked together to ensure that he received follow through in a very timely manner.   I, also, explained to him that his insurance company was to blame for delaying his care by dictating what tests he could have without care for what was the urgent, best test to be performed as suggested by the specialist and physician team.    Patient did confirm that he saw [facility] this am and that they have set him up for a CTA tomorrow.    In closing, he did mention that once he was through with this problem, he would be seeking care outside of [place] as he was "done" with this.  He did not appear to care for any explanation that I had to give today but was calm and cooperative throughout the conversation.   He also mentioned issues with getting his rx refills for his atorvastatin.  I did review this request that was pending in Allie Bossier, NP's basket and did go ahead and fill for 90 days based on rx refill protocol.

## 2020-11-16 NOTE — Telephone Encounter (Addendum)
My understanding is that he doesn't have any outstanding care issues for me to address so I will sign off.  The vascular surgery notes from today suggest getting the same study that I ordered last week.  The imaging I ordered was delayed by his insurance company.  It should be noted in the chart that we addressed his abnormal imaging quickly on Friday morning upon call report and we (esp referral department) worked diligently to get him care in spite of his insurance company's failure.

## 2020-11-16 NOTE — Telephone Encounter (Signed)
Sent a message back to [place] with [facility] making them aware of the CTA Head/Neck insurance approval. Below is the message sent as FYI to the Provider at [facility].  "IF needed, his CTA Head/Neck is already approved by insurance. The PCP [name] Friendly, NP recommended he get a CTA Head/Neck done, we had him scheduled and he declined -- wanted to see [facility]. It was explained to him the importance of getting the scan done given his symptoms and also the recommendations from the Radiologist on previous scans. He still declined. Just wanted you all to know so that if the Doctor still wants to pursue the scans that all that has to be done is just schedule the patient - insurance already approved."

## 2020-11-16 NOTE — Telephone Encounter (Unsigned)
Patient called wanting to know why his medications have been denied.    I informed him that we have not denied them but they are pending for provider approval and she is out of the office.   Based on fill guidelines, patient was seen on 10/21/20 for his annual and is rescheduled to follow up with Allie Bossier, NP in October regarding labs.  He is compliant with care and appointments.  Will refill X 1 per protocol.   Patient states he usually gets 90 days supply as this is cheaper through insurance.  I will refill X 90 days with no additional to carry through his follow up.

## 2020-11-16 NOTE — Telephone Encounter (Signed)
I sent a message to [facility] team member Kenney Houseman regarding the STAT referral To clarify: The patient was never scheduled and has not cancelled any appts  They have routed the referral to the Nurse to review and she will call the patient to schedule him today.

## 2020-11-16 NOTE — Telephone Encounter (Addendum)
Received a message from [facility] with [facility] - pt was seen today by [facility] and they have recommended him have CTA Head/Neck. Their office is working on scheduling this for him asap  Patient is scheduled with [facility] on 11/23/20 for ASAP 1 wk f/u CTA Head/Neck

## 2020-11-16 NOTE — Telephone Encounter (Addendum)
In follow up today, I did confirm that he did refuse to go to the CTA that was scheduled yesterday by referral coordinator, Ashtyn.  It does appear; however, that the appointments cancelled for 8/22 and 8/23 with [facility] have been rescheduled to today for patient from their office.  Hopefully he will be seen today for continued care support.    Will wait for patient to return call at his convenience as needed to discuss his concerns.    Thanks to all involved.

## 2020-11-16 NOTE — Progress Notes (Addendum)
ASSESSMENT & PLAN   LEFT CAROTID STENOSIS: The carotid duplex scan that was done elsewhere suggest a left carotid occlusion or near occlusion.  The MRI likewise confirms possible occlusion of the left internal carotid artery.  Fortunately the right carotid is widely patent with no evidence of stenosis.  Both vertebral arteries are widely patent.  I have recommended that we obtain a CT angiogram of the head neck to determine if the left carotid is occluded.  If it is occluded and there would be nothing to offer from a surgical standpoint.  If it is not occluded then this would help [place] evaluate him for possible intervention either carotid endarterectomy or trams carotid stenting.  I will see him back after the study to discuss these results.  He is on a statin.  I have asked him to begin taking 81 mg of aspirin daily.  If the CT angiogram confirms an occlusion and I think he should also be on Plavix so that he is on dual antiplatelet therapy.  If the CT angiogram shows that he is at candidate for carotid endarterectomy we would consider this.  If if the carotid is not occluded but is not surgically accessible he might be a candidate for trams carotid stenting in which case he would also have to be on Plavix prior to the procedure.  I will see him back after his CT angiogram to discuss these results and determine the best course of action.  REASON FOR CONSULT:    Left carotid stenosis.  The consult is requested by [name] Friendly, NP.  HPI:   Alan [name] is a 75 y.o. male who noted the gradual onset of dizziness about 3 weeks ago.  This occurs mostly when he is bending over.  He subsequently had an episode of transient loss of vision in the left eye.  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  His work-up has included an MRI and a CT of the head.  These results are discussed below.  His carotid duplex scan done elsewhere showed a near occlusion of  the left internal carotid artery.  He was sent for urgent vascular consultation.  He tells me that his vision has improved significantly on the left.  He denies any focal weakness or paresthesias.  He denies any expressive or receptive aphasia.  He has had no previous history of stroke.  His risk factors for peripheral vascular disease include borderline diabetes, hypertension, hypercholesterolemia, and a remote history of tobacco use.  He quit in 1998.  He denies any family history of premature cardiovascular disease.    He does have COPD.  He denies any history of myocardial infarction or history of congestive heart failure.  He has had no recent chest pain.  He does admit to dyspnea on exertion.   Past Medical History:  Diagnosis Date   Allergic rhinitis, cause unspecified    Allergy    Cancer ([place])    Basal cell skin cancer,left ear   Carpal tunnel syndrome    Chronic airway obstruction, not elsewhere classified    Esophageal reflux    Glaucoma (increased eye pressure)    Lipoma of unspecified site    Osteoarthrosis, unspecified whether generalized or localized, lower leg    Other and unspecified hyperlipidemia    Personal history of colonic polyps    Personal history of other malignant neoplasm of skin    Unspecified essential hypertension  Family History  Problem Relation Age of Onset   Diabetes Mother    Hyperlipidemia Mother    Hypertension Mother    Heart attack Mother    Obesity Mother    Hyperlipidemia Father    Hypertension Father    Lung cancer Father    Colon polyps Father    Hyperlipidemia Brother    Hypertension Brother    Hyperlipidemia Brother    Hypertension Brother    Hypertension Sister    Hyperlipidemia Sister    Lung cancer Sister    Hyperlipidemia Sister    Drug abuse Daughter    Colon cancer Neg Hx    Esophageal cancer Neg Hx    Pancreatic cancer Neg Hx    Rectal cancer Neg Hx     SOCIAL HISTORY: Social History   Tobacco Use    Smoking status: Former    Packs/day: 3.00    Years: 25.00    Pack years: 75.00    Types: Cigarettes    Quit date: 11/23/1996    Years since quitting: 23.9   Smokeless tobacco: Never  Substance Use Topics   Alcohol use: Not Currently    Alcohol/week: 0.0 standard drinks    No Known Allergies  Current Outpatient Medications  Medication Sig Dispense Refill   albuterol (PROVENTIL HFA;VENTOLIN HFA) 108 (90 Base) MCG/ACT inhaler Inhale 2 puffs into the lungs every 4 (four) hours as needed for wheezing or shortness of breath (cough, shortness of breath or wheezing.). 1 Inhaler 1   amLODipine (NORVASC) 10 MG tablet TAKE 1 TABLET BY MOUTH EVERY DAY FOR BLOOD PRESSURE 30 tablet 0   atorvastatin (LIPITOR) 80 MG tablet TAKE 1 TABLET BY MOUTH EVERY DAY FOR CHOLESTEROL 30 tablet 0   cetirizine (ZYRTEC) 10 MG tablet TAKE 1 TABLET BY MOUTH DAILY AS NEEDED FOR ALLERGIES 90 tablet 2   fish oil-omega-3 fatty acids 1000 MG capsule Take 2 g by mouth daily.     losartan (COZAAR) 100 MG tablet TAKE 1 TABLET BY MOUTH EVERY DAY FOR BLOOD PRESSURE 90 tablet 3   montelukast (SINGULAIR) 10 MG tablet TAKE 1 TABLET (10 MG TOTAL) BY MOUTH AT BEDTIME. FOR ALLERGIES 90 tablet 0   Multiple Vitamin (MULTIVITAMIN) tablet Take 1 tablet by mouth daily.     sertraline (ZOLOFT) 50 MG tablet TAKE 1 TABLET BY MOUTH EVERY DAY FOR DEPRESSION 90 tablet 0   SYMBICORT 160-4.5 MCG/ACT inhaler INHALE 2 PUFFS INTO THE LUNGS TWICE A DAY (Patient taking differently: As needed) 30.6 Inhaler 1   tamsulosin (FLOMAX) 0.4 MG CAPS capsule Take 1 capsule (0.4 mg total) by mouth daily. For urine flow and urine frequency 90 capsule 0   triamcinolone cream (KENALOG) 0.1 % Apply 1 application topically 2 (two) times daily. 30 g 0   Current Facility-Administered Medications  Medication Dose Route Frequency Provider Last Rate Last Admin   triamcinolone acetonide (KENALOG) 10 MG/ML injection 10 mg  10 mg Other Once Wallene Huh, DPM         REVIEW OF SYSTEMS:  '[X]'$  denotes positive finding, '[ ]'$  denotes negative finding Cardiac  Comments:  Chest pain or chest pressure:    Shortness of breath upon exertion:    Short of breath when lying flat:    Irregular heart rhythm:        Vascular    Pain in calf, thigh, or hip brought on by ambulation:    Pain in feet at night that wakes you up from your sleep:  Blood clot in your veins:    Leg swelling:         Pulmonary    Oxygen at home:    Productive cough:     Wheezing:         Neurologic    Sudden weakness in arms or legs:     Sudden numbness in arms or legs:     Sudden onset of difficulty speaking or slurred speech:    Temporary loss of vision in one eye:  x Left eye  Problems with dizziness:         Gastrointestinal    Blood in stool:     Vomited blood:         Genitourinary    Burning when urinating:     Blood in urine:        Psychiatric    Major depression:         Hematologic    Bleeding problems:    Problems with blood clotting too easily:        Skin    Rashes or ulcers:        Constitutional    Fever or chills:    -  PHYSICAL EXAM:   Vitals:   11/16/20 1010 11/16/20 1012  BP: 138/84 138/83  Pulse: 65   Resp: 20   Temp: 98 F (36.7 C)   SpO2: 97%   Weight: 261 lb (118.4 kg)   Height: 6' (1.829 m)    Body mass index is 35.4 kg/m.  GENERAL: The patient is a well-nourished male, in no acute distress. The vital signs are documented above. CARDIAC: There is a regular rate and rhythm.  VASCULAR: I do not detect carotid bruits. He has palpable femoral, popliteal, dorsalis pedis, and posterior tibial pulses bilaterally. He has no significant lower extremity swelling. PULMONARY: There is good air exchange bilaterally without wheezing or rales. ABDOMEN: Soft and non-tender with normal pitched bowel sounds.  I do not palpate an aneurysm although it is difficult to assess because of his body habitus. MUSCULOSKELETAL: There are no major  deformities. NEUROLOGIC: I do not detect any focal weakness or paresthesias on exam. SKIN: There are no ulcers or rashes noted. PSYCHIATRIC: The patient has a normal affect.  DATA:    CAROTID DUPLEX: I reviewed the carotid duplex that was done on 11/14/2020.   On the right side there was a less than 39% stenosis.  The right vertebral artery is patent with antegrade flow.  On the left side there was minimal diastolic flow in the internal carotid artery suggesting a near occlusion.  The left vertebral artery was patent with antegrade flow.  MRI BRAIN: I reviewed the MRI of the brain that was done on 11/11/2020.  This showed evidence of poor flow or occlusion of the left ICA.  It was felt that flow reconstituted at the ICA terminus.  There was no evidence of associated left hemispheric ischemia.  There was some evidence of small vessel disease.  Radiology recommended a CT angiogram of the head and neck.  CT HEAD: I reviewed the CT of the head that was done on 11/07/2020.  There were no acute findings.  LABS: I have reviewed the labs from 11/07/2020.  Creatinine is 0.73.  GFR is greater than 60.  Hemoglobin 13.7.  Platelets 159,000.  Deitra Mayo [facility]

## 2020-11-18 ENCOUNTER — Other Ambulatory Visit: Payer: Self-pay

## 2020-11-18 ENCOUNTER — Ambulatory Visit (HOSPITAL_COMMUNITY)
Admission: RE | Admit: 2020-11-18 | Discharge: 2020-11-18 | Disposition: A | Discharge status: Home / Self Care | Payer: Medicare HMO | Source: Ambulatory Visit | Attending: Vascular Surgery | Admitting: Vascular Surgery

## 2020-11-18 DIAGNOSIS — I6522 Occlusion and stenosis of left carotid artery: Secondary | ICD-10-CM | POA: Insufficient documentation

## 2020-11-18 DIAGNOSIS — I672 Cerebral atherosclerosis: Secondary | ICD-10-CM | POA: Diagnosis not present

## 2020-11-18 DIAGNOSIS — I6523 Occlusion and stenosis of bilateral carotid arteries: Secondary | ICD-10-CM | POA: Diagnosis not present

## 2020-11-18 MED ORDER — IOHEXOL 350 MG/ML SOLN
80.0000 mL | Freq: Once | INTRAVENOUS | Status: AC | PRN
  Administered 2020-11-18: 80 mL via INTRAVENOUS

## 2020-11-19 NOTE — Telephone Encounter (Signed)
Thank you to all who were involved in this patient's care; each of you acted promptly and efficiently and I couldn't be more grateful.   Very unfortunate to hear about the delays with his insurance and also his misperception of our excellent care. In review of his scans and vascular notes, it looks like we may have prevented a serious stroke or other serious condition.   He has been on a high intensity statin for years (atorvastatin 80 mg - LDL of 68 in July 2022), and I recommended he start 81 mg of aspirin on 11/08/20 (as did [facility] on 11/16/20) so everyone acted accordingly. He seems to be in the hands of vascular services now.   If he wishes to part ways with our clinic then I support him in this decision. Dr's Damita Dunnings and Einar Pheasant, myself, Ashtyn, and [place] did everything in our power to ensure he was cared for promptly and efficiently. Thank you team!

## 2020-11-21 ENCOUNTER — Ambulatory Visit (HOSPITAL_COMMUNITY): Payer: Medicare HMO

## 2020-11-22 ENCOUNTER — Encounter: Payer: Medicare HMO | Admitting: Vascular Surgery

## 2020-11-23 ENCOUNTER — Encounter: Payer: Self-pay | Admitting: Vascular Surgery

## 2020-11-23 ENCOUNTER — Ambulatory Visit (INDEPENDENT_AMBULATORY_CARE_PROVIDER_SITE_OTHER): Payer: Medicare HMO | Admitting: Vascular Surgery

## 2020-11-23 ENCOUNTER — Other Ambulatory Visit: Payer: Self-pay

## 2020-11-23 VITALS — BP 143/84 | HR 57 | Temp 97.7°F | Resp 20 | Ht 72.0 in | Wt 263.0 lb

## 2020-11-23 DIAGNOSIS — I6522 Occlusion and stenosis of left carotid artery: Secondary | ICD-10-CM

## 2020-11-23 NOTE — Progress Notes (Signed)
Patient name: ILYA [name] MRN: [id number] DOB: 1945-08-25 Sex: male  REASON FOR VISIT:   Follow-up after CT angiogram of the neck  HPI:   Alan [name] is a pleasant 75 y.o. male who I last saw on 11/16/2020 with a left carotid stenosis.  A carotid duplex scan done elsewhere suggested that the left carotid was occluded or nearly occluded.  MRI likely confirm possible occlusion of the left internal carotid artery.  The right carotid artery was widely patent without evidence of stenosis.  The vertebral arteries were patent.  I recommended a CT angiogram of the neck to determine if the left carotid is occluded.  If it was occluded there would be nothing to offer from a surgical standpoint.  If it were not we could evaluate him for possible intervention.  He was on a statin and I asked him to begin taking 81 mg of aspirin daily.  Since I saw him last, he denies any history of stroke, TIAs, expressive or receptive aphasia, or amaurosis fugax.  He does complain of some dizziness when he sits up quickly.  Current Outpatient Medications  Medication Sig Dispense Refill   albuterol (PROVENTIL HFA;VENTOLIN HFA) 108 (90 Base) MCG/ACT inhaler Inhale 2 puffs into the lungs every 4 (four) hours as needed for wheezing or shortness of breath (cough, shortness of breath or wheezing.). 1 Inhaler 1   amLODipine (NORVASC) 10 MG tablet TAKE 1 TABLET BY MOUTH EVERY DAY FOR BLOOD PRESSURE 90 tablet 0   atorvastatin (LIPITOR) 80 MG tablet TAKE 1 TABLET BY MOUTH EVERY DAY FOR CHOLESTEROL 90 tablet 0   cetirizine (ZYRTEC) 10 MG tablet TAKE 1 TABLET BY MOUTH DAILY AS NEEDED FOR ALLERGIES 90 tablet 2   fish oil-omega-3 fatty acids 1000 MG capsule Take 2 g by mouth daily.     losartan (COZAAR) 100 MG tablet TAKE 1 TABLET BY MOUTH EVERY DAY FOR BLOOD PRESSURE 90 tablet 3   montelukast (SINGULAIR) 10 MG tablet TAKE 1 TABLET (10 MG TOTAL) BY MOUTH AT BEDTIME. FOR ALLERGIES 90 tablet 0   Multiple Vitamin (MULTIVITAMIN)  tablet Take 1 tablet by mouth daily.     sertraline (ZOLOFT) 50 MG tablet TAKE 1 TABLET BY MOUTH EVERY DAY FOR DEPRESSION 90 tablet 0   SYMBICORT 160-4.5 MCG/ACT inhaler INHALE 2 PUFFS INTO THE LUNGS TWICE A DAY (Patient taking differently: As needed) 30.6 Inhaler 1   tamsulosin (FLOMAX) 0.4 MG CAPS capsule Take 1 capsule (0.4 mg total) by mouth daily. For urine flow and urine frequency 90 capsule 0   triamcinolone cream (KENALOG) 0.1 % Apply 1 application topically 2 (two) times daily. 30 g 0   Current Facility-Administered Medications  Medication Dose Route Frequency Provider Last Rate Last Admin   triamcinolone acetonide (KENALOG) 10 MG/ML injection 10 mg  10 mg Other Once Regal, Norman S, DPM        REVIEW OF SYSTEMS:  '[X]'$  denotes positive finding, '[ ]'$  denotes negative finding Vascular    Leg swelling    Cardiac    Chest pain or chest pressure:    Shortness of breath upon exertion:    Short of breath when lying flat:    Irregular heart rhythm:    Constitutional    Fever or chills:     PHYSICAL EXAM:   Vitals:   11/23/20 1600 11/23/20 1603  BP: (!) 149/79 (!) 143/84  Pulse: (!) 57   Resp: 20   Temp: 97.7 F (36.5 C)   SpO2:  96%   Weight: 263 lb (119.3 kg)   Height: 6' (1.829 m)     GENERAL: The patient is a well-nourished male, in no acute distress. The vital signs are documented above. CARDIOVASCULAR: There is a regular rate and rhythm. PULMONARY: There is good air exchange bilaterally without wheezing or rales. VASCULAR: I do not detect carotid bruits.  DATA:   CT ANGIO NECK: I have reviewed the images of his CT angiogram of the neck.  This shows that the left common carotid artery is patent to just proximal to the bifurcation.  There is plaque in the distal common carotid artery and then severe calcific plaque at the bifurcation and proximal internal carotid artery.  The artery appears to be occluded at this point and the ICA appears to be occluded.  CT angio of the  head shows no acute intracranial abnormality.  Of note the right ICA has a less than 50% stenosis.  MEDICAL ISSUES:   OCCLUDED LEFT INTERNAL CAROTID ARTERY: His CT angiogram, MRI both confirmed left internal carotid artery occlusion.  He has no significant stenosis on the right and his vertebral arteries are patent.  Thus there is nothing to offer from a surgical standpoint for his left carotid occlusion.  I think we do need to follow the mild right carotid stenosis closely and have ordered a follow-up carotid duplex scan in 6 months and I will see him at that time.  He knows to call sooner if he has problems.  In the meantime he will remain on his aspirin and statin.  Deitra Mayo [facility] [place] [phone]

## 2020-11-24 ENCOUNTER — Other Ambulatory Visit: Payer: Self-pay

## 2020-11-24 DIAGNOSIS — I6522 Occlusion and stenosis of left carotid artery: Secondary | ICD-10-CM

## 2020-12-02 ENCOUNTER — Other Ambulatory Visit: Payer: Self-pay

## 2020-12-02 ENCOUNTER — Ambulatory Visit (INDEPENDENT_AMBULATORY_CARE_PROVIDER_SITE_OTHER): Payer: Medicare HMO | Admitting: Primary Care

## 2020-12-02 ENCOUNTER — Encounter: Payer: Self-pay | Admitting: Primary Care

## 2020-12-02 VITALS — BP 144/80 | HR 89 | Temp 98.1°F | Ht 72.0 in | Wt 262.0 lb

## 2020-12-02 DIAGNOSIS — R42 Dizziness and giddiness: Secondary | ICD-10-CM

## 2020-12-02 DIAGNOSIS — Z23 Encounter for immunization: Secondary | ICD-10-CM | POA: Diagnosis not present

## 2020-12-02 DIAGNOSIS — I6523 Occlusion and stenosis of bilateral carotid arteries: Secondary | ICD-10-CM | POA: Diagnosis not present

## 2020-12-02 DIAGNOSIS — I6529 Occlusion and stenosis of unspecified carotid artery: Secondary | ICD-10-CM | POA: Insufficient documentation

## 2020-12-02 HISTORY — DX: Dizziness and giddiness: R42

## 2020-12-02 NOTE — Assessment & Plan Note (Addendum)
Left common nearly occluded, left ICA occluded. Right side <50%. Evaluated by vascular services.   Will place referral for second opinion regarding his stenosis.   Continue aspirin 81 mg and atorvastatin 80 mg. LDL from July is at goal of <70.  All notes from vascular services and ED reviewed. All imaging reviewed.

## 2020-12-02 NOTE — Progress Notes (Signed)
Subjective:    [name], male    DOB: 1945-11-03, 75 y.o.   MRN: [id number]  HPI  Alan [name] is a very pleasant 75 y.o. 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   Symptoms of blurred vision and dizziness for the last month. He presented to the ED about one month ago for same, CT head and ECG negative. He then presented to his optometrist who found a Hollenhorst plaque to the left eye. He underwent MR brain, CT angio head and neck which revealed bilateral carotid artery stenosis, left nearly occluded.   He was referred to vascular services, evaluated in mid and late August per Dr. Scot Dock, nothing to offer from a surgical standpoint. Due for repeat imaging and follow up around February 2023.  He continues to notice dizziness, only when standing up from a seated position and standing up from bending forward. This began about one month ago, about the same now. He denies exertional dizziness, chest pain, shortness of breath.   He is checking his BP at home which is running 110's/50's-60's mostly. He is compliant to his losartan 100 mg, aspirin 81 mg and atorvastatin 80 mg daily. LDL of 68 in July 2022. His father had bilateral carotid stenosis and underwent surgical intervention to both sides.   He would like a second opinion regarding his left common and internal carotid occlusions. He would like to proceed with surgery but was told that he didn't need surgery. This has him very concerned given his father's history and his postural dizziness.   BP Readings from Last 3 Encounters:  12/02/20 (!) 144/80  11/23/20 (!) 143/84  11/16/20 138/83      Review of Systems  Respiratory:  Negative for shortness of breath.   Cardiovascular:  Negative for chest pain.  Neurological:  Positive for dizziness.       See HPI        Past Medical History:   Diagnosis Date   Allergic rhinitis, cause unspecified    Allergy    Cancer ([place])    Basal cell skin cancer,left ear   Carpal tunnel syndrome    Chronic airway obstruction, not elsewhere classified    Esophageal reflux    Glaucoma (increased eye pressure)    Lipoma of unspecified site    Osteoarthrosis, unspecified whether generalized or localized, lower leg    Other and unspecified hyperlipidemia    Personal history of colonic polyps    Personal history of other malignant neoplasm of skin    Unspecified essential hypertension     Social History   Socioeconomic History   Marital status: Divorced    Spouse name: Not on file   Number of children: 2   Years of education: Not on file   Highest education level: Not on file  Occupational History   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  Tobacco Use   Smoking status: Former    Packs/day: 3.00    Years: 25.00    Pack years: 75.00    Types: Cigarettes    Quit date: 11/23/1996    Years since quitting: 24.0   Smokeless tobacco: Never  Vaping Use   Vaping Use: Never used  Substance and Sexual Activity   Alcohol use: Not Currently    Alcohol/week: 0.0 standard drinks   Drug use: No   Sexual activity: Yes    Partners: Female  Comment: monogamous relationship  Other Topics Concern   Not on file  Social History Narrative   Regular exercise -- no            Social Determinants of Health   Financial Resource Strain: Not on file  Food Insecurity: Not on file  Transportation Needs: Not on file  Physical Activity: Not on file  Stress: Not on file  Social Connections: Not on file  Intimate Partner Violence: Not on file    Past Surgical History:  Procedure Laterality Date   bone spur  06-2003   right thumb   CATARACT EXTRACTION     KNEE ARTHROSCOPY  09-2007   partial medial mesicecotmy, patellar chonfroplasty   SHOULDER SURGERY  03-24-2007   removal bone spur   SHOULDER SURGERY Left 07/2016   [facility], Dr. Harmon Pier   TOTAL KNEE ARTHROPLASTY     03-2009 [facility]   TOTAL KNEE ARTHROPLASTY Left 12/2012   [facility]    Family History  Problem Relation Age of Onset   Diabetes Mother    Hyperlipidemia Mother    Hypertension Mother    Heart attack Mother    Obesity Mother    Hyperlipidemia Father    Hypertension Father    Lung cancer Father    Colon polyps Father    Hyperlipidemia Brother    Hypertension Brother    Hyperlipidemia Brother    Hypertension Brother    Hypertension Sister    Hyperlipidemia Sister    Lung cancer Sister    Hyperlipidemia Sister    Drug abuse Daughter    Colon cancer Neg Hx    Esophageal cancer Neg Hx    Pancreatic cancer Neg Hx    Rectal cancer Neg Hx     No Known Allergies  Current Outpatient Medications on File Prior to Visit  Medication Sig Dispense Refill   albuterol (PROVENTIL HFA;VENTOLIN HFA) 108 (90 Base) MCG/ACT inhaler Inhale 2 puffs into the lungs every 4 (four) hours as needed for wheezing or shortness of breath (cough, shortness of breath or wheezing.). 1 Inhaler 1   amLODipine (NORVASC) 10 MG tablet TAKE 1 TABLET BY MOUTH EVERY DAY FOR BLOOD PRESSURE 90 tablet 0   aspirin EC 81 MG tablet Take 81 mg by mouth daily. Swallow whole.     atorvastatin (LIPITOR) 80 MG tablet TAKE 1 TABLET BY MOUTH EVERY DAY FOR CHOLESTEROL 90 tablet 0   cetirizine (ZYRTEC) 10 MG tablet TAKE 1 TABLET BY MOUTH DAILY AS NEEDED FOR ALLERGIES 90 tablet 2   fish oil-omega-3 fatty acids 1000 MG capsule Take 2 g by mouth daily.     losartan (COZAAR) 100 MG tablet TAKE 1 TABLET BY MOUTH EVERY DAY FOR BLOOD PRESSURE 90 tablet 3   montelukast (SINGULAIR) 10 MG tablet TAKE 1 TABLET (10 MG TOTAL) BY MOUTH AT BEDTIME. FOR ALLERGIES 90 tablet 0   Multiple Vitamin (MULTIVITAMIN) tablet Take 1 tablet by mouth daily.     sertraline (ZOLOFT) 50 MG tablet TAKE 1 TABLET BY MOUTH EVERY DAY FOR DEPRESSION 90 tablet 0   SYMBICORT 160-4.5 MCG/ACT inhaler INHALE 2 PUFFS INTO THE  LUNGS TWICE A DAY (Patient taking differently: As needed) 30.6 Inhaler 1   tamsulosin (FLOMAX) 0.4 MG CAPS capsule Take 1 capsule (0.4 mg total) by mouth daily. For urine flow and urine frequency 90 capsule 0   Current Facility-Administered Medications on File Prior to Visit  Medication Dose Route Frequency Provider Last Rate Last Admin   triamcinolone  acetonide (KENALOG) 10 MG/ML injection 10 mg  10 mg Other Once Regal, Norman S, DPM        BP (!) 144/80   Pulse 89   Temp 98.1 F (36.7 C) (Temporal)   Ht 6' (1.829 m)   Wt 262 lb (118.8 kg)   SpO2 96%   BMI 35.53 kg/m  Objective:   Physical Exam Cardiovascular:     Rate and Rhythm: Normal rate and regular rhythm.  Pulmonary:     Effort: Pulmonary effort is normal.     Breath sounds: Normal breath sounds. No wheezing or rales.  Musculoskeletal:     Cervical back: Neck supple.  Skin:    General: Skin is warm and dry.  Neurological:     Mental Status: He is alert and oriented to person, place, and time.          Assessment & Plan:      This visit occurred during the SARS-CoV-2 public health emergency.  Safety protocols were in place, including screening questions prior to the visit, additional usage of staff PPE, and extensive cleaning of exam room while observing appropriate contact time as indicated for disinfecting solutions.

## 2020-12-02 NOTE — Patient Instructions (Addendum)
Stop taking tamsulosin medication for urinating at night.  You will be contacted regarding your referral to the vascular doctor.  Please let us know if you have not been contacted within two weeks.   It was a pleasure to see you today!         Influenza (Flu) Vaccine (Inactivated or Recombinant): What You Need to Know 1. Why get vaccinated? Influenza vaccine can prevent influenza (flu). Flu is a contagious disease that spreads around the [place] every year, usually between October and May. Anyone can get the flu, but it is more dangerous for some people. Infants and young children, people 6 years and older, pregnant people, and people with certain health conditions or a weakened immune system are at greatest risk of flu complications. Pneumonia, bronchitis, sinus infections, and ear infections are examples of flu-related complications. If you have a medical condition, such as heart disease, cancer, or diabetes, flu can make it worse. Flu can cause fever and chills, sore throat, muscle aches, fatigue, cough, headache, and runny or stuffy nose. Some people may have vomiting and diarrhea, though this is more common in children than adults. In an average year, thousands of people in the [place] die from flu, and many more are hospitalized. Flu vaccine prevents millions of illnesses and flu-related visits to the doctor each year. 2. Influenza vaccines CDC recommends everyone 6 months and older get vaccinated every flu season. Children 6 months through 72 years of age may need 2 doses during a single flu season. Everyone else needs only 1 dose each flu season. It takes about 2 weeks for protection to develop after vaccination. There are many flu viruses, and they are always changing. Each year a new flu vaccine is made to protect against the influenza viruses believed to be likely to cause disease in the upcoming flu season. Even when the vaccine doesn't exactly match these viruses, it  may still provide some protection. Influenza vaccine does not cause flu. Influenza vaccine may be given at the same time as other vaccines. 3. Talk with your health care provider Tell your vaccination provider if the person getting the vaccine: Has had an allergic reaction after a previous dose of influenza vaccine, or has any severe, life-threatening allergies Has ever had Guillain-Barr Syndrome (also called "GBS") In some cases, your health care provider may decide to postpone influenza vaccination until a future visit. Influenza vaccine can be administered at any time during pregnancy. People who are or will be pregnant during influenza season should receive inactivated influenza vaccine. People with minor illnesses, such as a cold, may be vaccinated. People who are moderately or severely ill should usually wait until they recover before getting influenza vaccine. Your health care provider can give you more information. 4. Risks of a vaccine reaction Soreness, redness, and swelling where the shot is given, fever, muscle aches, and headache can happen after influenza vaccination. There may be a very small increased risk of Guillain-Barr Syndrome (GBS) after inactivated influenza vaccine (the flu shot). Young children who get the flu shot along with pneumococcal vaccine (PCV13) and/or DTaP vaccine at the same time might be slightly more likely to have a seizure caused by fever. Tell your health care provider if a child who is getting flu vaccine has ever had a seizure. People sometimes faint after medical procedures, including vaccination. Tell your provider if you feel dizzy or have vision changes or ringing in the ears. As with any medicine, there is a very remote  chance of a vaccine causing a severe allergic reaction, other serious injury, or death. 5. What if there is a serious problem? An allergic reaction could occur after the vaccinated person leaves the clinic. If you see signs of a  severe allergic reaction (hives, swelling of the face and throat, difficulty breathing, a fast heartbeat, dizziness, or weakness), call 9-1-1 and get the person to the nearest hospital. For other signs that concern you, call your health care provider. Adverse reactions should be reported to the Vaccine Adverse Event Reporting System (VAERS). Your health care provider will usually file this report, or you can do it yourself. Visit the VAERS website at www.vaers.SamedayNews.es or call 930-813-3024. VAERS is only for reporting reactions, and VAERS staff members do not give medical advice. 6. The National Vaccine Injury Compensation Program The [employer] Vaccine Injury Compensation Program (VICP) is a federal program that was created to compensate people who may have been injured by certain vaccines. Claims regarding alleged injury or death due to vaccination have a time limit for filing, which may be as short as two years. Visit the VICP website at GoldCloset.com.ee or call 613-176-4318 to learn about the program and about filing a claim. 7. How can I learn more? Ask your health care provider. Call your local or state health department. Visit the website of the Food and Drug Administration (FDA) for vaccine package inserts and additional information at TraderRating.uy. Contact the Centers for Disease Control and Prevention (CDC): Call 986 645 6499 (1-800-CDC-INFO) or Visit CDC's website at https://gibson.com/. Vaccine Information Statement Inactivated Influenza Vaccine (11/06/2019) This information is not intended to replace advice given to you by your health care provider. Make sure you discuss any questions you have with your health care provider. Document Revised: 12/24/2019 Document Reviewed: 12/24/2019 Elsevier Patient Education  2022 [employer].

## 2020-12-02 NOTE — Assessment & Plan Note (Signed)
Seems postural as this only occurs with some positional changes.   Exam today benign.   Positive orthostatic vitals from laying to standing. He has done well historically on losartan.  Will discontinue tamsulosin as this was initiated for nocturia in late July during his last visit for CPE. He's seen no improvement in nocturia.   Continue losartan 100 mg. Stop tamsulosin.   He will update.  Discussed to rise and change positions slowly. ECG from August ED visit reviewed.

## 2020-12-12 ENCOUNTER — Other Ambulatory Visit: Payer: Self-pay

## 2020-12-12 ENCOUNTER — Ambulatory Visit (INDEPENDENT_AMBULATORY_CARE_PROVIDER_SITE_OTHER): Payer: Medicare HMO | Admitting: Vascular Surgery

## 2020-12-12 ENCOUNTER — Encounter (INDEPENDENT_AMBULATORY_CARE_PROVIDER_SITE_OTHER): Payer: Self-pay | Admitting: Vascular Surgery

## 2020-12-12 VITALS — BP 120/74 | HR 67 | Ht 72.0 in | Wt 260.0 lb

## 2020-12-12 DIAGNOSIS — E785 Hyperlipidemia, unspecified: Secondary | ICD-10-CM

## 2020-12-12 DIAGNOSIS — I10 Essential (primary) hypertension: Secondary | ICD-10-CM

## 2020-12-12 DIAGNOSIS — J449 Chronic obstructive pulmonary disease, unspecified: Secondary | ICD-10-CM

## 2020-12-12 DIAGNOSIS — I6523 Occlusion and stenosis of bilateral carotid arteries: Secondary | ICD-10-CM | POA: Diagnosis not present

## 2020-12-12 NOTE — Progress Notes (Signed)
MRN : [id number]  Alan [name] is a 75 y.o. (1946-02-13) male who presents with chief complaint of check carotid artery.  History of Present Illness:   The patient is seen for evaluation of carotid stenosis. The carotid stenosis was identified after he experienced left eye visual loss.  Subsequently both CTA and MRI were obtained which are reported as possible ICA occlusion.  The patient denies new episodes of amaurosis fugax and states his vision has actually improved. There is no recent history of TIA symptoms or focal motor deficits.   There is no history of migraine headaches. There is no history of seizures.  The patient is taking enteric-coated aspirin 81 mg daily.  The patient has a history of coronary artery disease, no recent episodes of angina or shortness of breath. The patient denies PAD or claudication symptoms. There is a history of hyperlipidemia which is being treated with a statin.  I have personally reviewed both the CT scan dated November 18, 2020 as well as the MRI dated 11/11/2020 and I believe they are consistent with a occlusion of the ICA.  My read is this is conclusive.    Current Meds  Medication Sig   albuterol (PROVENTIL HFA;VENTOLIN HFA) 108 (90 Base) MCG/ACT inhaler Inhale 2 puffs into the lungs every 4 (four) hours as needed for wheezing or shortness of breath (cough, shortness of breath or wheezing.).   amLODipine (NORVASC) 10 MG tablet TAKE 1 TABLET BY MOUTH EVERY DAY FOR BLOOD PRESSURE   aspirin EC 81 MG tablet Take 81 mg by mouth daily. Swallow whole.   atorvastatin (LIPITOR) 80 MG tablet TAKE 1 TABLET BY MOUTH EVERY DAY FOR CHOLESTEROL   cetirizine (ZYRTEC) 10 MG tablet TAKE 1 TABLET BY MOUTH DAILY AS NEEDED FOR ALLERGIES   fish oil-omega-3 fatty acids 1000 MG capsule Take 2 g by mouth daily.   losartan (COZAAR) 100 MG tablet TAKE 1 TABLET BY MOUTH EVERY DAY FOR BLOOD PRESSURE   montelukast (SINGULAIR) 10 MG tablet TAKE 1 TABLET (10 MG TOTAL) BY  MOUTH AT BEDTIME. FOR ALLERGIES   Multiple Vitamin (MULTIVITAMIN) tablet Take 1 tablet by mouth daily.   sertraline (ZOLOFT) 50 MG tablet TAKE 1 TABLET BY MOUTH EVERY DAY FOR DEPRESSION   SYMBICORT 160-4.5 MCG/ACT inhaler INHALE 2 PUFFS INTO THE LUNGS TWICE A DAY (Patient taking differently: As needed)   Current Facility-Administered Medications for the 12/12/20 encounter (Office Visit) with Delana Meyer, [name], MD  Medication   triamcinolone acetonide (KENALOG) 10 MG/ML injection 10 mg    Past Medical History:  Diagnosis Date   Allergic rhinitis, cause unspecified    Allergy    Cancer ([place])    Basal cell skin cancer,left ear   Carpal tunnel syndrome    Chronic airway obstruction, not elsewhere classified    Esophageal reflux    Glaucoma (increased eye pressure)    Lipoma of unspecified site    Osteoarthrosis, unspecified whether generalized or localized, lower leg    Other and unspecified hyperlipidemia    Personal history of colonic polyps    Personal history of other malignant neoplasm of skin    Unspecified essential hypertension     Past Surgical History:  Procedure Laterality Date   bone spur  06-2003   right thumb   CATARACT EXTRACTION     KNEE ARTHROSCOPY  09-2007   partial medial mesicecotmy, patellar chonfroplasty   SHOULDER SURGERY  03-24-2007   removal bone spur   SHOULDER SURGERY Left 07/2016  [facility], Dr. Harmon Pier   TOTAL KNEE ARTHROPLASTY     03-2009 [facility]   TOTAL KNEE ARTHROPLASTY Left 12/2012   [facility]    Social History Social History   Tobacco Use   Smoking status: Former    Packs/day: 3.00    Years: 25.00    Pack years: 75.00    Types: Cigarettes    Quit date: 11/23/1996    Years since quitting: 24.0   Smokeless tobacco: Never  Vaping Use   Vaping Use: Never used  Substance Use Topics   Alcohol use: Not Currently    Alcohol/week: 0.0 standard drinks   Drug use: No    Family History Family History  Problem Relation  Age of Onset   Diabetes Mother    Hyperlipidemia Mother    Hypertension Mother    Heart attack Mother    Obesity Mother    Hyperlipidemia Father    Hypertension Father    Lung cancer Father    Colon polyps Father    Hyperlipidemia Brother    Hypertension Brother    Hyperlipidemia Brother    Hypertension Brother    Hypertension Sister    Hyperlipidemia Sister    Lung cancer Sister    Hyperlipidemia Sister    Drug abuse Daughter    Colon cancer Neg Hx    Esophageal cancer Neg Hx    Pancreatic cancer Neg Hx    Rectal cancer Neg Hx     No Known Allergies   REVIEW OF SYSTEMS (Negative unless checked)  Constitutional: '[]'$ Weight loss  '[]'$ Fever  '[]'$ Chills Cardiac: '[]'$ Chest pain   '[]'$ Chest pressure   '[]'$ Palpitations   '[]'$ Shortness of breath when laying flat   '[]'$ Shortness of breath with exertion. Vascular:  '[]'$ Pain in legs with walking   '[]'$ Pain in legs at rest  '[]'$ History of DVT   '[]'$ Phlebitis   '[]'$ Swelling in legs   '[]'$ Varicose veins   '[]'$ Non-healing ulcers Pulmonary:   '[]'$ Uses home oxygen   '[]'$ Productive cough   '[]'$ Hemoptysis   '[]'$ Wheeze  '[]'$ COPD   '[]'$ Asthma Neurologic:  '[]'$ Dizziness   '[]'$ Seizures   '[]'$ History of stroke   '[]'$ History of TIA  '[]'$ Aphasia   '[]'$ Vissual changes   '[]'$ Weakness or numbness in arm   '[]'$ Weakness or numbness in leg Musculoskeletal:   '[]'$ Joint swelling   '[]'$ Joint pain   '[]'$ Low back pain Hematologic:  '[]'$ Easy bruising  '[]'$ Easy bleeding   '[]'$ Hypercoagulable state   '[]'$ Anemic Gastrointestinal:  '[]'$ Diarrhea   '[]'$ Vomiting  '[]'$ Gastroesophageal reflux/heartburn   '[]'$ Difficulty swallowing. Genitourinary:  '[]'$ Chronic kidney disease   '[]'$ Difficult urination  '[]'$ Frequent urination   '[]'$ Blood in urine Skin:  '[]'$ Rashes   '[]'$ Ulcers  Psychological:  '[]'$ History of anxiety   '[]'$  History of major depression.  Physical Examination  Vitals:   12/12/20 0936  BP: 120/74  Pulse: 67  Weight: 260 lb (117.9 kg)  Height: 6' (1.829 m)   Body mass index is 35.26 kg/m. Gen: WD/WN, NAD Head: [place]/AT, No temporalis wasting.   Ear/Nose/Throat: Hearing grossly intact, nares w/o erythema or drainage Eyes: PER, EOMI, sclera nonicteric.  Neck: Supple, no masses.  No bruit or JVD.  Pulmonary:  Good air movement, no audible wheezing, no use of accessory muscles.  Cardiac: RRR, normal S1, S2, no Murmurs. Vascular:   no carotid bruit noted Vessel Right Left  Radial Palpable Palpable  Carotid Palpable Palpable  Gastrointestinal: soft, non-distended. No guarding/no peritoneal signs.  Musculoskeletal: M/S 5/5 throughout.  No visible deformity.  Neurologic: CN 2-12 intact. Pain and light touch intact  in extremities.  Symmetrical.  Speech is fluent. Motor exam as listed above. Psychiatric: Judgment intact, Mood & affect appropriate for pt's clinical situation. Dermatologic: No rashes or ulcers noted.  No changes consistent with cellulitis.   CBC Lab Results  Component Value Date   WBC 4.6 11/07/2020   HGB 13.7 11/07/2020   HCT 42.5 11/07/2020   MCV 89.7 11/07/2020   PLT 159 11/07/2020    BMET    Component Value Date/Time   NA 140 11/07/2020 1218   NA 136 01/16/2013 0616   K 4.1 11/07/2020 1218   K 3.5 01/16/2013 0616   CL 106 11/07/2020 1218   CL 105 01/16/2013 0616   CO2 28 11/07/2020 1218   CO2 26 01/16/2013 0616   GLUCOSE 122 (H) 11/07/2020 1218   GLUCOSE 126 (H) 01/16/2013 0616   BUN 14 11/07/2020 1218   BUN 10 01/16/2013 0616   CREATININE 0.73 11/07/2020 1218   CREATININE 0.69 01/16/2013 0616   CALCIUM 9.4 11/07/2020 1218   CALCIUM 9.0 01/16/2013 0616   GFRNONAA >60 11/07/2020 1218   GFRNONAA >60 01/16/2013 0616   GFRAA >60 01/16/2013 0616   CrCl cannot be calculated (Patient's most recent lab result is older than the maximum 21 days allowed.).  COAG Lab Results  Component Value Date   INR 1.0 11/07/2020   INR 1.0 12/24/2012    Radiology CT ANGIO HEAD W OR WO CONTRAST  Result Date: 11/18/2020 CLINICAL DATA:  Carotid stenosis, left; dizziness, blurred vision in left eye, mild headache  EXAM: CT ANGIOGRAPHY HEAD AND NECK TECHNIQUE: Multidetector CT imaging of the head and neck was performed using the standard protocol during bolus administration of intravenous contrast. Multiplanar CT image reconstructions and MIPs were obtained to evaluate the vascular anatomy. Carotid stenosis measurements (when applicable) are obtained utilizing NASCET criteria, using the distal internal carotid diameter as the denominator. CONTRAST:  37m OMNIPAQUE IOHEXOL 350 MG/ML SOLN COMPARISON:  None. FINDINGS: CT HEAD Brain: There is no acute intracranial hemorrhage, mass effect, or edema. Gray-white differentiation is preserved. There is no extra-axial fluid collection. Ventricles and sulci are within normal limits in size and configuration. Minimal patchy hypoattenuation in the supratentorial white matter is nonspecific but may reflect minor chronic microvascular ischemic changes. Encephalomalacia along the anterior inferior falx. Vascular: Better evaluated on CTA portion. Skull: Calvarium is unremarkable. Sinuses/Orbits: No acute finding. Other: None. Review of the MIP images confirms the above findings CTA NECK Aortic arch: Mild calcified plaque along the arch. Great vessel origins are patent. Right carotid system: Patent. Calcified plaque along the common carotid with minimal stenosis. Calcified plaque at the ICA origin causing less than 50% stenosis. Left carotid system: Common carotid is patent to just proximal to the bifurcation but demonstrates decreased enhancement. There is eccentric mixed plaque along the mid common carotid causing approximately 40% stenosis. Mixed plaque is present along the distal common carotid with subsequent marked calcified plaque at the bifurcation and proximal internal carotid. There is likely near occlusion just proximal to the bifurcation. The ICA is essentially occluded with possible minimal enhancement centrally. Vertebral arteries: Patent and codominant. Skeleton: Degenerative  changes of the cervical spine. Other neck: Unremarkable. Upper chest: No apical lung mass. Review of the MIP images confirms the above findings CTA HEAD Anterior circulation: Intracranial right internal carotid artery is patent with mild calcified plaque. Minimal enhancement centrally of the petrous and cavernous left ICA. Reconstitution at the level of posterior communicating artery origin. Decreased or absent left ophthalmic artery enhancement.  Anterior and middle cerebral arteries are patent. Posterior circulation: Intracranial vertebral arteries, basilar artery, and posterior cerebral arteries are patent. Venous sinuses: Patent as allowed by contrast bolus timing. Review of the MIP images confirms the above findings IMPRESSION: No acute intracranial abnormality. Likely near occlusion of the left common carotid just proximal to the bifurcation. ICA is essentially occluded in the neck with possible minimal enhancement centrally. Intracranially, there is again probable minimal enhancement of the central petrous and cavernous left ICA. Reconstitution is present at the level of the posterior communicating artery origin. Diminished left ophthalmic artery enhancement. Plaque at the right ICA origin causes less than 50% stenosis. Electronically Signed   By: Macy Mis M.D.   On: 11/18/2020 18:40   CT ANGIO NECK W OR WO CONTRAST  Result Date: 11/18/2020 CLINICAL DATA:  Carotid stenosis, left; dizziness, blurred vision in left eye, mild headache EXAM: CT ANGIOGRAPHY HEAD AND NECK TECHNIQUE: Multidetector CT imaging of the head and neck was performed using the standard protocol during bolus administration of intravenous contrast. Multiplanar CT image reconstructions and MIPs were obtained to evaluate the vascular anatomy. Carotid stenosis measurements (when applicable) are obtained utilizing NASCET criteria, using the distal internal carotid diameter as the denominator. CONTRAST:  38m OMNIPAQUE IOHEXOL 350 MG/ML  SOLN COMPARISON:  None. FINDINGS: CT HEAD Brain: There is no acute intracranial hemorrhage, mass effect, or edema. Gray-white differentiation is preserved. There is no extra-axial fluid collection. Ventricles and sulci are within normal limits in size and configuration. Minimal patchy hypoattenuation in the supratentorial white matter is nonspecific but may reflect minor chronic microvascular ischemic changes. Encephalomalacia along the anterior inferior falx. Vascular: Better evaluated on CTA portion. Skull: Calvarium is unremarkable. Sinuses/Orbits: No acute finding. Other: None. Review of the MIP images confirms the above findings CTA NECK Aortic arch: Mild calcified plaque along the arch. Great vessel origins are patent. Right carotid system: Patent. Calcified plaque along the common carotid with minimal stenosis. Calcified plaque at the ICA origin causing less than 50% stenosis. Left carotid system: Common carotid is patent to just proximal to the bifurcation but demonstrates decreased enhancement. There is eccentric mixed plaque along the mid common carotid causing approximately 40% stenosis. Mixed plaque is present along the distal common carotid with subsequent marked calcified plaque at the bifurcation and proximal internal carotid. There is likely near occlusion just proximal to the bifurcation. The ICA is essentially occluded with possible minimal enhancement centrally. Vertebral arteries: Patent and codominant. Skeleton: Degenerative changes of the cervical spine. Other neck: Unremarkable. Upper chest: No apical lung mass. Review of the MIP images confirms the above findings CTA HEAD Anterior circulation: Intracranial right internal carotid artery is patent with mild calcified plaque. Minimal enhancement centrally of the petrous and cavernous left ICA. Reconstitution at the level of posterior communicating artery origin. Decreased or absent left ophthalmic artery enhancement. Anterior and middle cerebral  arteries are patent. Posterior circulation: Intracranial vertebral arteries, basilar artery, and posterior cerebral arteries are patent. Venous sinuses: Patent as allowed by contrast bolus timing. Review of the MIP images confirms the above findings IMPRESSION: No acute intracranial abnormality. Likely near occlusion of the left common carotid just proximal to the bifurcation. ICA is essentially occluded in the neck with possible minimal enhancement centrally. Intracranially, there is again probable minimal enhancement of the central petrous and cavernous left ICA. Reconstitution is present at the level of the posterior communicating artery origin. Diminished left ophthalmic artery enhancement. Plaque at the right ICA origin causes less than 50%  stenosis. Electronically Signed   By: Macy Mis M.D.   On: 11/18/2020 18:40   VAS US CAROTID  Result Date: 11/14/2020 Carotid Arterial Duplex Study Patient Name:  ARVILLE HEXT  Date of Exam:   11/14/2020 Medical Rec #: [id number]          Accession #:    [id number] Date of Birth: 1945/08/08           Patient Gender: M Patient Age:   27 years Exam Location:  [facility] Procedure:      VAS US CAROTID Referring Phys: [name] Friendly --------------------------------------------------------------------------------  Indications:       Visual disturbance and Dizziness. 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 Poor/low flow in the left ICA by                    MR. Risk Factors:      Hypertension, hyperlipidemia. Limitations        Today's exam was limited due to heavy calcification and the                    resulting shadowing. Comparison Study:  No prior study Performing Technologist: Sharion Dove RVS  Examination Guidelines: A complete evaluation includes B-mode imaging, spectral Doppler, color Doppler, and power Doppler as needed of all accessible portions of each vessel. Bilateral testing is considered an integral part of  a complete examination. Limited examinations for reoccurring indications may be performed as noted.  Right Carotid Findings: +----------+--------+--------+--------+------------------+--------+           PSV cm/sEDV cm/sStenosisPlaque DescriptionComments +----------+--------+--------+--------+------------------+--------+ CCA Prox  103     29                                         +----------+--------+--------+--------+------------------+--------+ CCA Mid   119     34              calcific                   +----------+--------+--------+--------+------------------+--------+ CCA Distal93      32                                         +----------+--------+--------+--------+------------------+--------+ ICA Prox  95      29              calcific                   +----------+--------+--------+--------+------------------+--------+ ICA Distal106     36                                         +----------+--------+--------+--------+------------------+--------+ ECA       123     22              calcific                   +----------+--------+--------+--------+------------------+--------+ +----------+--------+-------+--------+-------------------+           PSV cm/sEDV cmsDescribeArm Pressure (mmHG) +----------+--------+-------+--------+-------------------+ [id number]                                        +----------+--------+-------+--------+-------------------+ +---------+--------+--+--------+--+  VertebralPSV cm/s50EDV cm/s15 +---------+--------+--+--------+--+  Left Carotid Findings: +----------+--------+--------+--------+------------------+-----------------+           PSV cm/sEDV cm/sStenosisPlaque DescriptionComments          +----------+--------+--------+--------+------------------+-----------------+ CCA Prox  60      1               heterogenous      abnormal waveform  +----------+--------+--------+--------+------------------+-----------------+ CCA Distal20      2               heterogenous      abnormal waveform +----------+--------+--------+--------+------------------+-----------------+ ICA Prox  17      6               calcific                            +----------+--------+--------+--------+------------------+-----------------+ ECA       160     19                                                  +----------+--------+--------+--------+------------------+-----------------+ +----------+--------+--------+--------+-------------------+           PSV cm/sEDV cm/sDescribeArm Pressure (mmHG) +----------+--------+--------+--------+-------------------+ Subclavian102                                         +----------+--------+--------+--------+-------------------+ +---------+--------+--+--------+--+ VertebralPSV cm/s78EDV cm/s23 +---------+--------+--+--------+--+   Summary: Right Carotid: Velocities in the right ICA are consistent with a 1-39% stenosis. Left         Near occlusion of the proximal ICA. Flow reconstituted through the Carotid:     ECA. Vertebrals:  Bilateral vertebral arteries demonstrate antegrade flow. Subclavians: Normal flow hemodynamics were seen in bilateral subclavian              arteries. *See table(s) above for measurements and observations.  Electronically signed by Monica Martinez MD on 11/14/2020 at 4:10:26 PM.    Final      Assessment/Plan 1. Bilateral carotid artery stenosis Recommend:  Given the patient's asymptomatic right ICA stenosis and the occlusion of the LICA no further invasive testing or surgery at this time.  I  have personally reviewed both the CT scan dated November 18, 2020 as well as the MRI dated 11/11/2020 and I believe they are consistent with a occlusion of the ICA.  My read is this is conclusive  Continue antiplatelet therapy as prescribed Continue management of CAD, HTN and  Hyperlipidemia Healthy heart diet,  encouraged exercise at least 4 times per week Follow up in 6 months with duplex ultrasound and physical exam    - VAS US CAROTID; Future  2. Essential hypertension Continue antihypertensive medications as already ordered, these medications have been reviewed and there are no changes at this time.   3. Chronic obstructive pulmonary disease, unspecified COPD type ([place]) Continue pulmonary medications and aerosols as already ordered, these medications have been reviewed and there are no changes at this time.    4. Hyperlipidemia, unspecified hyperlipidemia type Continue statin as ordered and reviewed, no changes at this time     Hortencia Pilar, MD  12/12/2020 9:49 AM

## 2020-12-15 ENCOUNTER — Encounter (INDEPENDENT_AMBULATORY_CARE_PROVIDER_SITE_OTHER): Payer: Self-pay | Admitting: Vascular Surgery

## 2020-12-15 ENCOUNTER — Telehealth (INDEPENDENT_AMBULATORY_CARE_PROVIDER_SITE_OTHER): Payer: Self-pay | Admitting: Vascular Surgery

## 2020-12-15 MED ORDER — CLOPIDOGREL BISULFATE 75 MG PO TABS: 75.0000 mg | ORAL_TABLET | Freq: Every day | ORAL | 11 refills | Status: DC

## 2020-12-15 NOTE — Telephone Encounter (Signed)
Patient called stating that he was seen 12/12/2020 (new patient with [facility]) and provider stated that he would send over a Rx of Plavix for patient. Patient states he hasn't heard anything from the pharmacy and was calling to check the status.  Please advise.   [phone] [address] in [place]

## 2020-12-16 NOTE — Telephone Encounter (Signed)
I made the doctor aware of the pt's Rx needs and he put the Rx in and the pt has picked it up from his pharmacy.

## 2020-12-29 DIAGNOSIS — M5416 Radiculopathy, lumbar region: Secondary | ICD-10-CM | POA: Diagnosis not present

## 2021-01-16 ENCOUNTER — Other Ambulatory Visit: Payer: Self-pay | Admitting: Family Medicine

## 2021-01-16 ENCOUNTER — Other Ambulatory Visit: Payer: Self-pay | Admitting: Primary Care

## 2021-01-16 DIAGNOSIS — R351 Nocturia: Secondary | ICD-10-CM

## 2021-01-16 DIAGNOSIS — F32A Depression, unspecified: Secondary | ICD-10-CM

## 2021-01-17 ENCOUNTER — Other Ambulatory Visit: Payer: Self-pay | Admitting: Primary Care

## 2021-01-17 ENCOUNTER — Encounter: Payer: Self-pay | Admitting: Internal Medicine

## 2021-01-17 DIAGNOSIS — D696 Thrombocytopenia, unspecified: Secondary | ICD-10-CM

## 2021-01-19 DIAGNOSIS — M5416 Radiculopathy, lumbar region: Secondary | ICD-10-CM | POA: Diagnosis not present

## 2021-01-19 DIAGNOSIS — M7062 Trochanteric bursitis, left hip: Secondary | ICD-10-CM | POA: Diagnosis not present

## 2021-01-23 ENCOUNTER — Ambulatory Visit: Payer: Medicare HMO

## 2021-01-24 ENCOUNTER — Telehealth: Payer: Self-pay | Admitting: *Deleted

## 2021-01-24 ENCOUNTER — Other Ambulatory Visit: Payer: Medicare HMO

## 2021-01-24 NOTE — Telephone Encounter (Signed)
Dr. Hilarie Fredrickson,  This pt is coming in on 02-21-21 for PV.  His recall colonoscopy is 03-07-21.  Would you please review his chart?  He recently had episodes of dizziness and blurry vision in his left eye.  He was seen in the ED on 11-07-20 for this.   I spoke with him today and he states he was started on Plavix d/t left carotid artery blockage but only took one pill d/t side effects.  He is on ASA 81 mg daily.  I asked him if his prescribing MD knows this and he states he "sent them a message but never heard from them."  No further issues with blurry vision or dizziness; he states blurry vision "was from a piece of plaque that broke lose I was told."  Are you ok proceeding with his colonoscopy?  Please advise.  Thanks, [employer]

## 2021-01-24 NOTE — Telephone Encounter (Signed)
Opened in error

## 2021-01-24 NOTE — Telephone Encounter (Signed)
While prepping pt's chart, noted he has started Plavix.  Attempted to reach pt to verify if this is correct.  LMOM to call office back

## 2021-01-24 NOTE — Telephone Encounter (Signed)
Pt scheduled for OV with Alan [name] 02-21-21 at 8:30 .  Pt is aware this will be in person. Informed pt to call prescribing MD regarding stopping his Plavix

## 2021-01-24 NOTE — Telephone Encounter (Signed)
Thanks for the message I recommend office visit with me or APP before colonoscopy given the issues you highlighted I agree he should be in touch with the prescribing provider of the plavix to see if this or some alternative anti-plt medication remains recommended JMP

## 2021-01-27 ENCOUNTER — Other Ambulatory Visit: Payer: Self-pay | Admitting: Primary Care

## 2021-01-27 DIAGNOSIS — I10 Essential (primary) hypertension: Secondary | ICD-10-CM

## 2021-02-10 ENCOUNTER — Other Ambulatory Visit: Payer: Self-pay | Admitting: Primary Care

## 2021-02-10 DIAGNOSIS — E782 Mixed hyperlipidemia: Secondary | ICD-10-CM

## 2021-02-21 ENCOUNTER — Ambulatory Visit (INDEPENDENT_AMBULATORY_CARE_PROVIDER_SITE_OTHER): Payer: Medicare HMO | Admitting: Gastroenterology

## 2021-02-21 ENCOUNTER — Encounter: Payer: Self-pay | Admitting: Gastroenterology

## 2021-02-21 ENCOUNTER — Telehealth: Payer: Self-pay | Admitting: *Deleted

## 2021-02-21 VITALS — BP 114/50 | HR 73 | Ht 72.0 in | Wt 249.0 lb

## 2021-02-21 DIAGNOSIS — Z860100 Personal history of colon polyps, unspecified: Secondary | ICD-10-CM | POA: Insufficient documentation

## 2021-02-21 MED ORDER — NA SULFATE-K SULFATE-MG SULF 17.5-3.13-1.6 GM/177ML PO SOLN: 1.0000 | Freq: Once | ORAL | 0 refills | Status: AC

## 2021-02-21 NOTE — Progress Notes (Signed)
02/21/2021 Alan [name] [id number] Jul 07, 1945   HISTORY OF PRESENT ILLNESS: This is a 75 year old male who is a patient of Dr. Vena Rua.  He is due for a surveillance colonoscopy.  His last colonoscopy was in August 2019 as listed below.  He had several small adenomatous polyps removed at that time and repeat was recommended at a 3-year interval.  He had some issues with dizziness and blurry vision back in August.  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  He is on aspirin 81 mg daily was started on Plavix as well.  He has not had any further symptoms since around that time.  He says that he actually only took the Plavix for couple days and it had too many side effects, so has not been taking it.  He does not have any GI complaints.  Colonoscopy 10/2017:  - One 4 mm polyp in the ascending colon, removed with a cold snare. Resected and retrieved. - One 5 mm polyp in the transverse colon, removed with a cold snare. Resected and retrieved. - One 5 mm polyp in the descending colon, removed with a cold snare. Resected and retrieved. - Two 4 to 5 mm polyps in the sigmoid colon, removed with a cold snare. Resected and retrieved. - Diverticulosis in the sigmoid colon and in the descending colon. - The distal rectum and anal verge are normal on retroflexion view.  Pathology showed adenomatous and inflammatory polyps.  Past Medical History:  Diagnosis Date   Allergic rhinitis, cause unspecified    Allergy    Cancer ([place])    Basal cell skin cancer,left ear   Carotid artery stenosis    left   Carpal tunnel syndrome    Chronic airway obstruction, not elsewhere classified    Esophageal reflux    Glaucoma (increased eye pressure)    Lipoma of unspecified site    Osteoarthrosis, unspecified whether generalized or localized, lower leg    Other and unspecified hyperlipidemia    Personal history of colonic  polyps    Personal history of other malignant neoplasm of skin    Unspecified essential hypertension    Past Surgical History:  Procedure Laterality Date   bone spur  06-2003   right thumb   CATARACT EXTRACTION     KNEE ARTHROSCOPY  09-2007   partial medial mesicecotmy, patellar chonfroplasty   SHOULDER SURGERY  03-24-2007   removal bone spur   SHOULDER SURGERY Left 07/2016   [facility], Dr. Harmon Pier   TOTAL KNEE ARTHROPLASTY     03-2009 [facility]   TOTAL KNEE ARTHROPLASTY Left 12/2012   [facility]    reports that he quit smoking about 24 years ago. His smoking use included cigarettes. He has a 75.00 pack-year smoking history. He has never used smokeless tobacco. He reports that he does not currently use alcohol. He reports that he does not use drugs. family history includes Colon polyps in his father; Diabetes in his mother; Drug abuse in his daughter; Heart attack in his mother; Hyperlipidemia in his brother, brother, father, mother, sister, and sister; Hypertension in his brother, brother, father, mother, and sister; Lung cancer in his father and sister; Obesity in his mother. No Known Allergies    Outpatient Encounter Medications as of 02/21/2021  Medication Sig   albuterol (PROVENTIL HFA;VENTOLIN HFA) 108 (90 Base) MCG/ACT inhaler Inhale 2 puffs into the lungs every 4 (four)  hours as needed for wheezing or shortness of breath (cough, shortness of breath or wheezing.).   amLODipine (NORVASC) 10 MG tablet TAKE 1 TABLET BY MOUTH EVERY DAY FOR BLOOD PRESSURE   aspirin EC 81 MG tablet Take 81 mg by mouth daily. Swallow whole.   atorvastatin (LIPITOR) 80 MG tablet TAKE 1 TABLET BY MOUTH EVERY DAY FOR CHOLESTEROL   cetirizine (ZYRTEC) 10 MG tablet TAKE 1 TABLET BY MOUTH DAILY AS NEEDED FOR ALLERGIES   clopidogrel (PLAVIX) 75 MG tablet Take 1 tablet (75 mg total) by mouth daily.   fish oil-omega-3 fatty acids 1000 MG capsule Take 2 g by mouth daily.   losartan (COZAAR) 100 MG  tablet TAKE 1 TABLET BY MOUTH EVERY DAY FOR BLOOD PRESSURE   montelukast (SINGULAIR) 10 MG tablet TAKE 1 TABLET (10 MG TOTAL) BY MOUTH AT BEDTIME. FOR ALLERGIES   Multiple Vitamin (MULTIVITAMIN) tablet Take 1 tablet by mouth daily.   sertraline (ZOLOFT) 50 MG tablet TAKE 1 TABLET BY MOUTH EVERY DAY FOR DEPRESSION   SYMBICORT 160-4.5 MCG/ACT inhaler INHALE 2 PUFFS INTO THE LUNGS TWICE A DAY (Patient taking differently: As needed)   Facility-Administered Encounter Medications as of 02/21/2021  Medication   triamcinolone acetonide (KENALOG) 10 MG/ML injection 10 mg     REVIEW OF SYSTEMS  : All other systems reviewed and negative except where noted in the History of Present Illness.   PHYSICAL EXAM: BP (!) 114/50   Pulse 73   Ht 6' (1.829 m)   Wt 249 lb (112.9 kg)   BMI 33.77 kg/m  General: Well developed white male in no acute distress Head: Normocephalic and atraumatic Eyes:  Sclerae anicteric, conjunctiva pink. Ears: Normal auditory acuity Lungs: Clear throughout to auscultation; no W/R/R. Heart: Regular rate and rhythm; no M/R/G. Abdomen: Soft, non-distended.  BS present.  Non-tender. Rectal:  Will be done at the time of colonoscopy. Musculoskeletal: Symmetrical with no gross deformities  Skin: No lesions on visible extremities Extremities: No edema  Neurological: Alert oriented x 4, grossly non-focal Psychological:  Alert and cooperative. Normal mood and affect  ASSESSMENT AND PLAN: *Personal history of adenomatous colon polyps: Last colonoscopy August 2019 with several small adenomatous polyps removed.  was due for 3-year colonoscopy recall.  He is listed to be on Plavix, but he says he is not taking it, only took it for a couple of days and said it had too many side effects.  He is on aspirin 81 mg daily.  He did not have a stroke, but may be a TIA and does have 100% carotid occlusion.  I think he is fine to proceed with colonoscopy as those events were almost 4 months ago.   We will send a message to his vascular physician for clearance just to be safe, but we will plan to schedule with Dr. Hilarie Fredrickson.  The risks, benefits, and alternatives to colonoscopy were discussed with the patient and he consents to proceed.   CC:  Pleas Koch, NP

## 2021-02-21 NOTE — Patient Instructions (Signed)
You have been scheduled for a colonoscopy. Please follow written instructions given to you at your visit today.  Please pick up your prep supplies at the pharmacy within the next 1-3 days. If you use inhalers (even only as needed), please bring them with you on the day of your procedure.  If you are age 74 or older, your body mass index should be between 23-30. Your Body mass index is 33.77 kg/m. If this is out of the aforementioned range listed, please consider follow up with your Primary Care Provider.  If you are age 34 or younger, your body mass index should be between 19-25. Your Body mass index is 33.77 kg/m. If this is out of the aformentioned range listed, please consider follow up with your Primary Care Provider.   ________________________________________________________  The [facility] providers would like to encourage you to use [facility] to communicate with providers for non-urgent requests or questions.  Due to long hold times on the telephone, sending your provider a message by [facility] may be a faster and more efficient way to get a response.  Please allow 48 business hours for a response.  Please remember that this is for non-urgent requests.  _______________________________________________________

## 2021-02-21 NOTE — Telephone Encounter (Signed)
Hi Heather,  Dr. Delana Meyer is not with our group [facility]) and it does not look like we have ever seen this patient. Looks like Dr. Delana Meyer is with [facility].  Thank you! Grace Haggart

## 2021-02-21 NOTE — Telephone Encounter (Addendum)
[facility] [address] [place], [place]  [address] Phone:  [phone]   Fax:  [phone]   Alan [name] DOB: 1945/09/14 MRN: [id number]  Dear: Dr. Delana Meyer:   The patient above is schedule for a colonoscopy in the near future under general anesthesia (Propofol). Patient states he has stopped his Plavix.    Please fax or route a note of Medical Clearance to [phone], Attn: Caryl Asp, [place].   Please advise if this patient will require an office visit or further medical work-up before clearance can be given.   Thank you,   Caryl Asp, [facility]

## 2021-02-26 NOTE — Progress Notes (Deleted)
Subjective:   Alan [name] is a 75 y.o. male who presents for Medicare Annual/Subsequent preventive examination.  I connected with Calogero Geisen today by telephone and verified that I am speaking with the correct person using two identifiers. Location patient: home Location provider: work Persons participating in the virtual visit: patient, Marine scientist.    I discussed the limitations, risks, security and privacy concerns of performing an evaluation and management service by telephone and the availability of in person appointments. I also discussed with the patient that there may be a patient responsible charge related to this service. The patient expressed understanding and verbally consented to this telephonic visit.    Interactive audio and video telecommunications were attempted between this provider and patient, however failed, due to patient having technical difficulties OR patient did not have access to video capability.  We continued and completed visit with audio only.  Some vital signs may be absent or patient reported.   Time Spent with patient on telephone encounter: *** minutes  Review of Systems           Objective:    There were no vitals filed for this visit. There is no height or weight on file to calculate BMI.  Advanced Directives 11/07/2020 07/10/2019 10/18/2017 10/12/2016  Does Patient Have a Medical Advance Directive? No No No No  Does patient want to make changes to medical advance directive? - - - Yes (MAU/Ambulatory/Procedural Areas - Information given)  Would patient like information on creating a medical advance directive? No - Patient declined No - Patient declined No - Patient declined -    Current Medications (verified) Outpatient Encounter Medications as of 02/27/2021  Medication Sig   albuterol (PROVENTIL HFA;VENTOLIN HFA) 108 (90 Base) MCG/ACT inhaler Inhale 2 puffs into the lungs every 4 (four) hours as needed for wheezing or shortness of breath  (cough, shortness of breath or wheezing.).   amLODipine (NORVASC) 10 MG tablet TAKE 1 TABLET BY MOUTH EVERY DAY FOR BLOOD PRESSURE   aspirin EC 81 MG tablet Take 81 mg by mouth daily. Swallow whole.   atorvastatin (LIPITOR) 80 MG tablet TAKE 1 TABLET BY MOUTH EVERY DAY FOR CHOLESTEROL   cetirizine (ZYRTEC) 10 MG tablet TAKE 1 TABLET BY MOUTH DAILY AS NEEDED FOR ALLERGIES   clopidogrel (PLAVIX) 75 MG tablet Take 1 tablet (75 mg total) by mouth daily.   fish oil-omega-3 fatty acids 1000 MG capsule Take 2 g by mouth daily.   losartan (COZAAR) 100 MG tablet TAKE 1 TABLET BY MOUTH EVERY DAY FOR BLOOD PRESSURE   montelukast (SINGULAIR) 10 MG tablet TAKE 1 TABLET (10 MG TOTAL) BY MOUTH AT BEDTIME. FOR ALLERGIES   Multiple Vitamin (MULTIVITAMIN) tablet Take 1 tablet by mouth daily.   sertraline (ZOLOFT) 50 MG tablet TAKE 1 TABLET BY MOUTH EVERY DAY FOR DEPRESSION   SYMBICORT 160-4.5 MCG/ACT inhaler INHALE 2 PUFFS INTO THE LUNGS TWICE A DAY (Patient taking differently: As needed)   Facility-Administered Encounter Medications as of 02/27/2021  Medication   triamcinolone acetonide (KENALOG) 10 MG/ML injection 10 mg    Allergies (verified) Patient has no known allergies.   History: Past Medical History:  Diagnosis Date   Allergic rhinitis, cause unspecified    Allergy    Cancer ([place])    Basal cell skin cancer,left ear   Carotid artery stenosis    left   Carpal tunnel syndrome    Chronic airway obstruction, not elsewhere classified    Esophageal reflux    Glaucoma (increased  eye pressure)    Lipoma of unspecified site    Osteoarthrosis, unspecified whether generalized or localized, lower leg    Other and unspecified hyperlipidemia    Personal history of colonic polyps    Personal history of other malignant neoplasm of skin    Unspecified essential hypertension    Past Surgical History:  Procedure Laterality Date   bone spur  06-2003   right thumb   CATARACT EXTRACTION     KNEE  ARTHROSCOPY  09-2007   partial medial mesicecotmy, patellar chonfroplasty   SHOULDER SURGERY  03-24-2007   removal bone spur   SHOULDER SURGERY Left 07/2016   [facility], Dr. Harmon Pier   TOTAL KNEE ARTHROPLASTY     03-2009 [facility]   TOTAL KNEE ARTHROPLASTY Left 12/2012   [facility]   Family History  Problem Relation Age of Onset   Diabetes Mother    Hyperlipidemia Mother    Hypertension Mother    Heart attack Mother    Obesity Mother    Hyperlipidemia Father    Hypertension Father    Lung cancer Father    Colon polyps Father    Hyperlipidemia Brother    Hypertension Brother    Hyperlipidemia Brother    Hypertension Brother    Hypertension Sister    Hyperlipidemia Sister    Lung cancer Sister    Hyperlipidemia Sister    Drug abuse Daughter    Colon cancer Neg Hx    Esophageal cancer Neg Hx    Pancreatic cancer Neg Hx    Rectal cancer Neg Hx    Social History   Socioeconomic History   Marital status: Divorced    Spouse name: Not on file   Number of children: 2   Years of education: Not on file   Highest education level: Not on file  Occupational History   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  Tobacco Use   Smoking status: Former    Packs/day: 3.00    Years: 25.00    Pack years: 75.00    Types: Cigarettes    Quit date: 11/23/1996    Years since quitting: 24.2   Smokeless tobacco: Never  Vaping Use   Vaping Use: Never used  Substance and Sexual Activity   Alcohol use: Not Currently    Alcohol/week: 0.0 standard drinks   Drug use: No   Sexual activity: Yes    Partners: Female    Comment: monogamous relationship  Other Topics Concern   Not on file  Social History Narrative   Regular exercise -- no            Social Determinants of Health   Financial Resource Strain: Not on file  Food Insecurity: Not on file  Transportation Needs: Not on file  Physical Activity: Not on file  Stress: Not on file  Social Connections: Not on file     Tobacco Counseling Counseling given: Not Answered   Clinical Intake:                 Diabetic?No         Activities of Daily Living In your present state of health, do you have any difficulty performing the following activities: 08/31/2020  Hearing? N  Vision? N  Difficulty concentrating or making decisions? N  Walking or climbing stairs? N  Dressing or bathing? N  Doing errands, shopping? N  Some recent data might be hidden    Patient Care Team: Pleas Koch, NP as PCP - General (Nurse Practitioner)  Warden Fillers, MD as Consulting Physician (Ophthalmology) Garrel Ridgel, [place] as Consulting Physician (Podiatry) Marchia Bond, MD as Consulting Physician (Orthopedic Surgery) Gerda Diss, DO as Consulting Physician (Sports Medicine) Iran Planas, MD as Consulting Physician (Orthopedic Surgery)  Indicate any recent Medical Services you may have received from other than [facility] providers in the past year (date may be approximate).     Assessment:   This is a routine wellness examination for Deontrae.  Hearing/Vision screen No results found.  Dietary issues and exercise activities discussed:     Goals Addressed   None    Depression Screen PHQ 2/9 Scores 10/21/2020 08/31/2020 07/10/2019 10/18/2017 10/12/2016 05/06/2015 05/03/2014  PHQ - 2 Score 0 0 0 0 0 1 0  PHQ- 9 Score 0 0 0 0 - - -  Some encounter information is confidential and restricted. Go to Review Flowsheets activity to see all data.    Fall Risk Fall Risk  08/31/2020 07/10/2019 10/18/2017 10/12/2016 05/03/2014  Falls in the past year? 0 0 No No No  Number falls in past yr: - 0 - - -  Injury with Fall? - 0 - - -  Risk for fall due to : - Medication side effect - - -  Follow up - Falls prevention discussed;Falls evaluation completed - - -    FALL RISK PREVENTION PERTAINING TO THE HOME:  Any stairs in or around the home? {YES/NO:21197} If so, are there any without handrails?  {YES/NO:21197} Home free of loose throw rugs in walkways, pet beds, electrical cords, etc? {YES/NO:21197} Adequate lighting in your home to reduce risk of falls? {YES/NO:21197}  ASSISTIVE DEVICES UTILIZED TO PREVENT FALLS:  Life alert? {YES/NO:21197} Use of a cane, walker or w/c? {YES/NO:21197} Grab bars in the bathroom? {YES/NO:21197} Shower chair or bench in shower? {YES/NO:21197} Elevated toilet seat or a handicapped toilet? {YES/NO:21197}  TIMED UP AND GO:  Was the test performed? No , visit completed over the phone.   Cognitive Function: MMSE - Mini Mental State Exam 07/10/2019 10/18/2017 10/12/2016  Orientation to time 5 5 5   Orientation to Place 5 5 5   Registration 3 3 3   Attention/ Calculation 5 0 0  Recall 3 3 1   Language- name 2 objects - 0 0  Language- repeat 1 1 1   Language- follow 3 step command - 3 3  Language- read & follow direction - 0 0  Write a sentence - 0 0  Copy design - 0 0  Total score - 20 18        Immunizations Immunization History  Administered Date(s) Administered   Fluad Quad(high Dose 65+) 12/14/2019, 12/02/2020   Influenza Split 03/02/2011, 02/28/2013   Influenza Whole 01/24/2007, 01/06/2008, 02/02/2009   Influenza, High Dose Seasonal PF 12/26/2016, 12/22/2017   Influenza,inj,Quad PF,6+ Mos 12/02/2015   Influenza-Unspecified 03/22/2014, 01/05/2015   PFIZER Comirnaty(Gray Top)Covid-19 Tri-Sucrose Vaccine 08/08/2020   PFIZER(Purple Top)SARS-COV-2 Vaccination 04/27/2019, 05/18/2019, 01/21/2020   Pneumococcal Conjugate-13 05/03/2014   Pneumococcal Polysaccharide-23 12/05/2011   Td 06/13/2006   Tdap 10/15/2016   Zoster Recombinat (Shingrix) 02/05/2020, 05/03/2020   Zoster, Live 09/03/2006    TDAP status: Up to date  Flu Vaccine status: Up to date  Pneumococcal vaccine status: Up to date  Covid-19 vaccine status: Completed vaccines  Qualifies for Shingles Vaccine? Yes   Zostavax completed Yes   Shingrix Completed?: Yes  Screening  Tests Health Maintenance  Topic Date Due   COLONOSCOPY (Pts 45-38yrs Insurance coverage will need to be confirmed)  11/13/2020  TETANUS/TDAP  10/16/2026   Pneumonia Vaccine 25+ Years old  Completed   INFLUENZA VACCINE  Completed   COVID-19 Vaccine  Completed   Hepatitis C Screening  Completed   Zoster Vaccines- Shingrix  Completed   HPV VACCINES  Aged Out    Health Maintenance  Health Maintenance Due  Topic Date Due   COLONOSCOPY (Pts 45-16yrs Insurance coverage will need to be confirmed)  11/13/2020    {Colorectal cancer screening:2101809}  Lung Cancer Screening: (Low Dose CT Chest recommended if Age 97-80 years, 30 pack-year currently smoking OR have quit w/in 15years.) does qualify.   Lung Cancer Screening Referral: ***  Additional Screening:  Hepatitis C Screening: does qualify; Completed 12/02/15  Vision Screening: Recommended annual ophthalmology exams for early detection of glaucoma and other disorders of the eye. Is the patient up to date with their annual eye exam?  {YES/NO:21197} Who is the provider or what is the name of the office in which the patient attends annual eye exams? *** If pt is not established with a provider, would they like to be referred to a provider to establish care? {YES/NO:21197}.   Dental Screening: Recommended annual dental exams for proper oral hygiene  Community Resource Referral / Chronic Care Management: CRR required this visit?  {YES/NO:21197}  CCM required this visit?  {YES/NO:21197}     Plan:     I have personally reviewed and noted the following in the patient's chart:   Medical and social history Use of alcohol, tobacco or illicit drugs  Current medications and supplements including opioid prescriptions. {Opioid Prescriptions:[phone]} Functional ability and status Nutritional status Physical activity Advanced directives List of other physicians Hospitalizations, surgeries, and ER visits in previous 12  months Vitals Screenings to include cognitive, depression, and falls Referrals and appointments  In addition, I have reviewed and discussed with patient certain preventive protocols, quality metrics, and best practice recommendations. A written personalized care plan for preventive services as well as general preventive health recommendations were provided to patient.   Due to this being a telephonic visit, the after visit summary with patients personalized plan was offered to patient via mail or my-chart. ***Patient declined at this time./ Patient would like to access on my-chart/ per request, patient was mailed a copy of AVS./ Patient preferred to pick up at office at next visit.   Loma Messing, LPN   [id number]   Nurse Health Advisor  Nurse Notes: none

## 2021-02-27 ENCOUNTER — Telehealth: Payer: Self-pay

## 2021-02-27 ENCOUNTER — Ambulatory Visit: Payer: Medicare HMO

## 2021-02-27 NOTE — Progress Notes (Signed)
Addendum: Reviewed and agree with assessment and management plan. Maykel Reitter M, MD  

## 2021-02-27 NOTE — Telephone Encounter (Signed)
Left patient a VM in regards to an AWVS for medicare today. Patient had a 9:45am telephone appointment to complete the visit. Attempts x3. Attempts were made on patients home and mobile number. Advised to contact office to reschedule this appointment. TM

## 2021-02-28 NOTE — Telephone Encounter (Signed)
Faxed request to Dr. Delana Meyer office.

## 2021-03-01 ENCOUNTER — Telehealth: Payer: Self-pay | Admitting: Internal Medicine

## 2021-03-02 NOTE — Telephone Encounter (Signed)
Return patient's call but unable to leave message as voicemail not set up.

## 2021-03-03 NOTE — Telephone Encounter (Signed)
I requested cardiac clearance on this patient from Dr. Delana Meyer. I faxed the request and sent the request via Epic and did not receive a response. Patient did not start his Plavix. Are you okay to proceed with his colonoscopy on 03/07/21 or do you want to reschedule? Please advise.

## 2021-03-03 NOTE — Telephone Encounter (Signed)
Called patient to review instructions. Unable to leave message as voicemail is not setup.

## 2021-03-06 NOTE — Telephone Encounter (Signed)
Left message for patient to call office.  

## 2021-03-07 ENCOUNTER — Ambulatory Visit (AMBULATORY_SURGERY_CENTER): Payer: Medicare HMO | Admitting: Internal Medicine

## 2021-03-07 ENCOUNTER — Encounter: Payer: Self-pay | Admitting: Internal Medicine

## 2021-03-07 ENCOUNTER — Other Ambulatory Visit: Payer: Self-pay

## 2021-03-07 VITALS — BP 129/82 | HR 65 | Temp 96.6°F | Resp 13 | Ht 72.0 in | Wt 249.0 lb

## 2021-03-07 DIAGNOSIS — I10 Essential (primary) hypertension: Secondary | ICD-10-CM | POA: Diagnosis not present

## 2021-03-07 DIAGNOSIS — J449 Chronic obstructive pulmonary disease, unspecified: Secondary | ICD-10-CM | POA: Diagnosis not present

## 2021-03-07 DIAGNOSIS — Z86010 Personal history of colonic polyps: Secondary | ICD-10-CM | POA: Diagnosis not present

## 2021-03-07 MED ORDER — SODIUM CHLORIDE 0.9 % IV SOLN: 500.0000 mL | Freq: Once | INTRAVENOUS | Status: DC

## 2021-03-07 NOTE — Op Note (Addendum)
[place] Patient Name: Alan [name] Procedure Date: 03/07/2021 8:22 AM MRN: [id number] Endoscopist: Jerene Bears , MD Age: 75 Referring MD:  Date of Birth: [birth year] Gender: Male Account #: [id number] Procedure:                Colonoscopy Indications:              High risk colon cancer surveillance: Personal                            history of multiple (3 or more) adenomas at last                            exam, Last colonoscopy: August 2019 Medicines:                Monitored Anesthesia Care Procedure:                Pre-Anesthesia Assessment:                           - Prior to the procedure, a History and Physical                            was performed, and patient medications and                            allergies were reviewed. The patient's tolerance of                            previous anesthesia was also reviewed. The risks                            and benefits of the procedure and the sedation                            options and risks were discussed with the patient.                            All questions were answered, and informed consent                            was obtained. Prior Anticoagulants: The patient has                            taken no previous anticoagulant or antiplatelet                            agents. ASA Grade Assessment: III - A patient with                            severe systemic disease. After reviewing the risks                            and benefits, the patient was deemed in  satisfactory condition to undergo the procedure.                           After obtaining informed consent, the colonoscope                            was passed under direct vision. Throughout the                            procedure, the patient's blood pressure, pulse, and                            oxygen saturations were monitored continuously. The                            CF [id number] #[id number] was  introduced through the anus                            and advanced to the terminal ileum. The colonoscopy                            was performed without difficulty. The patient                            tolerated the procedure well. The quality of the                            bowel preparation was excellent. The terminal                            ileum, ileocecal valve, appendiceal orifice, and                            rectum were photographed. Scope In: 8:36:10 AM Scope Out: 0:35:46 AM Scope Withdrawal Time: 0 hours 8 minutes 27 seconds  Total Procedure Duration: 0 hours 10 minutes 38 seconds  Findings:                 The digital rectal exam was normal.                           The terminal ileum appeared normal.                           Scattered small and large-mouthed diverticula were                            found in the sigmoid colon, descending colon and                            transverse colon.                           External hemorrhoids were found during  retroflexion. The hemorrhoids were small. Complications:            No immediate complications. Estimated Blood Loss:     Estimated blood loss: none. Impression:               - The examined portion of the ileum was normal.                           - Diverticulosis in the sigmoid colon, in the                            descending colon and in the transverse colon.                           - Small external hemorrhoids.                           - No specimens collected. Recommendation:           - Patient has a contact number available for                            emergencies. The signs and symptoms of potential                            delayed complications were discussed with the                            patient. Return to normal activities tomorrow.                            Written discharge instructions were provided to the                            patient.                            - Resume previous diet.                           - Continue present medications.                           - No repeat colonoscopy due to age at next                            surveillance interval and the absence of colonic                            polyps on today's exam. Jerene Bears, MD 03/07/2021 8:49:45 AM This report has been signed electronically.

## 2021-03-07 NOTE — Progress Notes (Signed)
Report to PACU, RN, vss, BBS= Clear.  

## 2021-03-07 NOTE — Progress Notes (Signed)
Patient here for colonoscopy for surveillance See office note for recent interaction in details Patient remains appropriate for [place] colonoscopy today  He is not on Plavix as he was worried about side effects  The nature of the procedure, as well as the risks, benefits, and alternatives were carefully and thoroughly reviewed with the patient. Ample time for discussion and questions allowed. The patient understood, was satisfied, and agreed to proceed.

## 2021-03-07 NOTE — Telephone Encounter (Signed)
Pt colonoscopy completed, he is not on Plavix as he was concerned about side-effects.  I asked he discuss this with his PCP

## 2021-03-07 NOTE — Patient Instructions (Signed)
YOU HAD AN ENDOSCOPIC PROCEDURE TODAY AT THE [facility]:   Refer to the procedure report that was given to you for any specific questions about what was found during the examination.  If the procedure report does not answer your questions, please call your gastroenterologist to clarify.  If you requested that your care partner not be given the details of your procedure findings, then the procedure report has been included in a sealed envelope for you to review at your convenience later.  YOU SHOULD EXPECT: Some feelings of bloating in the abdomen. Passage of more gas than usual.  Walking can help get rid of the air that was put into your GI tract during the procedure and reduce the bloating. If you had a lower endoscopy (such as a colonoscopy or flexible sigmoidoscopy) you may notice spotting of blood in your stool or on the toilet paper. If you underwent a bowel prep for your procedure, you may not have a normal bowel movement for a few days.  Please Note:  You might notice some irritation and congestion in your nose or some drainage.  This is from the oxygen used during your procedure.  There is no need for concern and it should clear up in a day or so.  SYMPTOMS TO REPORT IMMEDIATELY:  Following lower endoscopy (colonoscopy or flexible sigmoidoscopy):  Excessive amounts of blood in the stool  Significant tenderness or worsening of abdominal pains  Swelling of the abdomen that is new, acute  Fever of 100F or higher   For urgent or emergent issues, a gastroenterologist can be reached at any hour by calling [phone]. Do not use MyChart messaging for urgent concerns.    DIET:  We do recommend a small meal at first, but then you may proceed to your regular diet.  Drink plenty of fluids but you should avoid alcoholic beverages for 24 hours.  MEDICATIONS:  Continue present medications.  Please see handouts given to you by your recovery nurse.  Thank you for allowing us to  provide for your healthcare needs today.  ACTIVITY:  You should plan to take it easy for the rest of today and you should NOT DRIVE or use heavy machinery until tomorrow (because of the sedation medicines used during the test).    FOLLOW UP: Our staff will call the number listed on your records 48-72 hours following your procedure to check on you and address any questions or concerns that you may have regarding the information given to you following your procedure. If we do not reach you, we will leave a message.  We will attempt to reach you two times.  During this call, we will ask if you have developed any symptoms of COVID 19. If you develop any symptoms (ie: fever, flu-like symptoms, shortness of breath, cough etc.) before then, please call [phone].  If you test positive for Covid 19 in the 2 weeks post procedure, please call and report this information to us.    If any biopsies were taken you will be contacted by phone or by letter within the next 1-3 weeks.  Please call us at [phone] if you have not heard about the biopsies in 3 weeks.    SIGNATURES/CONFIDENTIALITY: You and/or your care partner have signed paperwork which will be entered into your electronic medical record.  These signatures attest to the fact that that the information above on your After Visit Summary has been reviewed and is understood.  Full responsibility of the   confidentiality of this discharge information lies with you and/or your care-partner.  

## 2021-03-09 ENCOUNTER — Telehealth: Payer: Self-pay

## 2021-03-09 DIAGNOSIS — Z08 Encounter for follow-up examination after completed treatment for malignant neoplasm: Secondary | ICD-10-CM | POA: Diagnosis not present

## 2021-03-09 DIAGNOSIS — C44722 Squamous cell carcinoma of skin of right lower limb, including hip: Secondary | ICD-10-CM | POA: Diagnosis not present

## 2021-03-09 DIAGNOSIS — R238 Other skin changes: Secondary | ICD-10-CM | POA: Diagnosis not present

## 2021-03-09 DIAGNOSIS — L218 Other seborrheic dermatitis: Secondary | ICD-10-CM | POA: Diagnosis not present

## 2021-03-09 DIAGNOSIS — L814 Other melanin hyperpigmentation: Secondary | ICD-10-CM | POA: Diagnosis not present

## 2021-03-09 DIAGNOSIS — D225 Melanocytic nevi of trunk: Secondary | ICD-10-CM | POA: Diagnosis not present

## 2021-03-09 DIAGNOSIS — L821 Other seborrheic keratosis: Secondary | ICD-10-CM | POA: Diagnosis not present

## 2021-03-09 DIAGNOSIS — Z85828 Personal history of other malignant neoplasm of skin: Secondary | ICD-10-CM | POA: Diagnosis not present

## 2021-03-09 DIAGNOSIS — B078 Other viral warts: Secondary | ICD-10-CM | POA: Diagnosis not present

## 2021-03-09 NOTE — Telephone Encounter (Signed)
  Follow up Call-  Call back number 03/07/2021  Post procedure Call Back phone  # [phone]  Permission to leave phone message Yes  Some recent data might be hidden     Patient questions:  Do you have a fever, pain , or abdominal swelling? No. Pain Score  0 *  Have you tolerated food without any problems? Yes.    Have you been able to return to your normal activities? Yes.    Do you have any questions about your discharge instructions: Diet   No. Medications  No. Follow up visit  No.  Do you have questions or concerns about your Care? No.  Actions: * If pain score is 4 or above: No action needed, pain <4.   Have you developed a fever since your procedure? No   2.   Have you had an respiratory symptoms (SOB or cough) since your procedure? No   3.   Have you tested positive for COVID 19 since your procedure no   4.   Have you had any family members/close contacts diagnosed with the COVID 19 since your procedure?  No    If yes to any of these questions please route to Joylene John, RN and Joella Prince, RN

## 2021-05-24 ENCOUNTER — Other Ambulatory Visit: Payer: Self-pay

## 2021-05-24 ENCOUNTER — Ambulatory Visit: Payer: Medicare HMO | Admitting: Primary Care

## 2021-05-24 ENCOUNTER — Ambulatory Visit (INDEPENDENT_AMBULATORY_CARE_PROVIDER_SITE_OTHER): Payer: Medicare HMO | Admitting: Family Medicine

## 2021-05-24 ENCOUNTER — Encounter: Payer: Self-pay | Admitting: Family Medicine

## 2021-05-24 VITALS — BP 120/68 | HR 65 | Temp 98.3°F | Ht 72.0 in | Wt 258.2 lb

## 2021-05-24 DIAGNOSIS — M19042 Primary osteoarthritis, left hand: Secondary | ICD-10-CM | POA: Diagnosis not present

## 2021-05-24 MED ORDER — TRIAMCINOLONE ACETONIDE 40 MG/ML IJ SUSP
20.0000 mg | Freq: Once | INTRAMUSCULAR | Status: AC
  Administered 2021-05-24: 20 mg via INTRA_ARTICULAR

## 2021-05-24 NOTE — Progress Notes (Signed)
° ° °  Miriah Maruyama T. Dhruti Ghuman, MD, [place] [facility] [place], [address]  Phone: [phone]   FAX: [phone]  Alan [name] - 76 y.o. male   MRN [id number]   Date of Birth: [birth year]  Date: 05/24/2021   PCP: Pleas Koch, NP   Referral: Pleas Koch, NP  Chief Complaint  Patient presents with   Hand Pain    Left Index Finger     This visit occurred during the SARS-CoV-2 public health emergency.  Safety protocols were in place, including screening questions prior to the visit, additional usage of staff PPE, and extensive cleaning of exam room while observing appropriate contact time as indicated for disinfecting solutions.   Subjective:   Alan [name] is a 76 y.o. very pleasant male patient with Body mass index is 35.02 kg/m. who presents with the following:  L 2nd MCP joint, Proc only  Aspiration/Injection Procedure Note [name] 06-24-1945 Date of procedure: 05/24/2021  Procedure: Small Joint Aspiration / Injection of L 2nd MCP joint Indications: Pain  Procedure Details Verbal consent was obtained. Risks benefits, and alternatives were discussed. Prepped with Chloraprep and Ethyl Chloride used for anesthesia. Under sterile conditions, patient injected into 2nd MCP joint at joint line in apex of snuffbox inserting perpendicularly with traction placed on thumb. Aspiration yields no blood. Decreased pain post injection. No complications. Needle size: 25 gauge Injection: 1/2 cc of Lidocaine 1% and Kenalog 20 mg Medication: 1/2 cc of Kenalog 40 mg (equaling Kenalog 20 mg)     ICD-10-CM   1. 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

## 2021-05-30 ENCOUNTER — Emergency Department (HOSPITAL_COMMUNITY): Payer: Medicare HMO

## 2021-05-30 ENCOUNTER — Observation Stay (HOSPITAL_COMMUNITY)
Admission: EM | Admit: 2021-05-30 | Discharge: 2021-05-31 | Disposition: A | Discharge status: Home / Self Care | Payer: Medicare HMO | Attending: Internal Medicine | Admitting: Internal Medicine

## 2021-05-30 ENCOUNTER — Other Ambulatory Visit: Payer: Self-pay

## 2021-05-30 ENCOUNTER — Encounter (HOSPITAL_COMMUNITY): Payer: Self-pay

## 2021-05-30 DIAGNOSIS — Z8349 Family history of other endocrine, nutritional and metabolic diseases: Secondary | ICD-10-CM | POA: Diagnosis not present

## 2021-05-30 DIAGNOSIS — Z20822 Contact with and (suspected) exposure to covid-19: Secondary | ICD-10-CM | POA: Diagnosis not present

## 2021-05-30 DIAGNOSIS — H532 Diplopia: Principal | ICD-10-CM | POA: Insufficient documentation

## 2021-05-30 DIAGNOSIS — I6782 Cerebral ischemia: Secondary | ICD-10-CM | POA: Diagnosis not present

## 2021-05-30 DIAGNOSIS — F32A Depression, unspecified: Secondary | ICD-10-CM | POA: Diagnosis not present

## 2021-05-30 DIAGNOSIS — H543 Unqualified visual loss, both eyes: Secondary | ICD-10-CM | POA: Insufficient documentation

## 2021-05-30 DIAGNOSIS — T45526A Underdosing of antithrombotic drugs, initial encounter: Secondary | ICD-10-CM | POA: Diagnosis not present

## 2021-05-30 DIAGNOSIS — I639 Cerebral infarction, unspecified: Secondary | ICD-10-CM | POA: Diagnosis not present

## 2021-05-30 DIAGNOSIS — J309 Allergic rhinitis, unspecified: Secondary | ICD-10-CM | POA: Insufficient documentation

## 2021-05-30 DIAGNOSIS — I10 Essential (primary) hypertension: Secondary | ICD-10-CM | POA: Diagnosis not present

## 2021-05-30 DIAGNOSIS — R519 Headache, unspecified: Secondary | ICD-10-CM | POA: Insufficient documentation

## 2021-05-30 DIAGNOSIS — G9389 Other specified disorders of brain: Secondary | ICD-10-CM | POA: Diagnosis not present

## 2021-05-30 DIAGNOSIS — Z7951 Long term (current) use of inhaled steroids: Secondary | ICD-10-CM | POA: Diagnosis not present

## 2021-05-30 DIAGNOSIS — I739 Peripheral vascular disease, unspecified: Secondary | ICD-10-CM | POA: Insufficient documentation

## 2021-05-30 DIAGNOSIS — Z66 Do not resuscitate: Secondary | ICD-10-CM | POA: Insufficient documentation

## 2021-05-30 DIAGNOSIS — Z96652 Presence of left artificial knee joint: Secondary | ICD-10-CM | POA: Diagnosis not present

## 2021-05-30 DIAGNOSIS — Z85828 Personal history of other malignant neoplasm of skin: Secondary | ICD-10-CM | POA: Insufficient documentation

## 2021-05-30 DIAGNOSIS — Z91128 Patient's intentional underdosing of medication regimen for other reason: Secondary | ICD-10-CM | POA: Diagnosis not present

## 2021-05-30 DIAGNOSIS — Z6835 Body mass index (BMI) 35.0-35.9, adult: Secondary | ICD-10-CM | POA: Diagnosis not present

## 2021-05-30 DIAGNOSIS — M1990 Unspecified osteoarthritis, unspecified site: Secondary | ICD-10-CM | POA: Diagnosis not present

## 2021-05-30 DIAGNOSIS — E785 Hyperlipidemia, unspecified: Secondary | ICD-10-CM | POA: Insufficient documentation

## 2021-05-30 DIAGNOSIS — J441 Chronic obstructive pulmonary disease with (acute) exacerbation: Secondary | ICD-10-CM | POA: Diagnosis not present

## 2021-05-30 DIAGNOSIS — Z87891 Personal history of nicotine dependence: Secondary | ICD-10-CM | POA: Diagnosis not present

## 2021-05-30 DIAGNOSIS — R29818 Other symptoms and signs involving the nervous system: Secondary | ICD-10-CM | POA: Insufficient documentation

## 2021-05-30 DIAGNOSIS — Z8249 Family history of ischemic heart disease and other diseases of the circulatory system: Secondary | ICD-10-CM | POA: Insufficient documentation

## 2021-05-30 DIAGNOSIS — I6523 Occlusion and stenosis of bilateral carotid arteries: Secondary | ICD-10-CM | POA: Insufficient documentation

## 2021-05-30 DIAGNOSIS — Z7982 Long term (current) use of aspirin: Secondary | ICD-10-CM | POA: Insufficient documentation

## 2021-05-30 DIAGNOSIS — Z79899 Other long term (current) drug therapy: Secondary | ICD-10-CM | POA: Diagnosis not present

## 2021-05-30 DIAGNOSIS — H40013 Open angle with borderline findings, low risk, bilateral: Secondary | ICD-10-CM | POA: Diagnosis not present

## 2021-05-30 DIAGNOSIS — Z7902 Long term (current) use of antithrombotics/antiplatelets: Secondary | ICD-10-CM | POA: Insufficient documentation

## 2021-05-30 DIAGNOSIS — K219 Gastro-esophageal reflux disease without esophagitis: Secondary | ICD-10-CM | POA: Insufficient documentation

## 2021-05-30 DIAGNOSIS — H5021 Vertical strabismus, right eye: Secondary | ICD-10-CM | POA: Diagnosis not present

## 2021-05-30 DIAGNOSIS — I6522 Occlusion and stenosis of left carotid artery: Secondary | ICD-10-CM | POA: Diagnosis not present

## 2021-05-30 LAB — COMPREHENSIVE METABOLIC PANEL
ALT: 22 U/L (ref 0–44)
AST: 18 U/L (ref 15–41)
Albumin: 4.2 g/dL (ref 3.5–5.0)
Alkaline Phosphatase: 56 U/L (ref 38–126)
Anion gap: 10 (ref 5–15)
BUN: 19 mg/dL (ref 8–23)
CO2: 25 mmol/L (ref 22–32)
Calcium: 9.4 mg/dL (ref 8.9–10.3)
Chloride: 103 mmol/L (ref 98–111)
Creatinine, Ser: 1 mg/dL (ref 0.61–1.24)
GFR, Estimated: >60 mL/min (ref >60)
Glucose, Bld: 114 mg/dL — ABNORMAL HIGH (ref 70–99)
Potassium: 4.2 mmol/L (ref 3.5–5.1)
Sodium: 138 mmol/L (ref 135–145)
Total Bilirubin: 0.5 mg/dL (ref 0.3–1.2)
Total Protein: 7.1 g/dL (ref 6.5–8.1)

## 2021-05-30 LAB — PROTIME-INR
INR: 1 (ref 0.8–1.2)
Prothrombin Time: 13.6 seconds (ref 11.4–15.2)

## 2021-05-30 LAB — DIFFERENTIAL
Abs Immature Granulocytes: 0.04 10*3/uL (ref 0.00–0.07)
Basophils Absolute: 0 10*3/uL (ref 0.0–0.1)
Basophils Relative: 0 %
Eosinophils Absolute: 0.1 10*3/uL (ref 0.0–0.5)
Eosinophils Relative: 1 %
Immature Granulocytes: 0 %
Lymphocytes Relative: 20 %
Lymphs Abs: 1.8 10*3/uL (ref 0.7–4.0)
Monocytes Absolute: 0.6 10*3/uL (ref 0.1–1.0)
Monocytes Relative: 7 %
Neutro Abs: 6.6 10*3/uL (ref 1.7–7.7)
Neutrophils Relative %: 72 %

## 2021-05-30 LAB — I-STAT CHEM 8, ED
BUN: 23 mg/dL (ref 8–23)
Calcium, Ion: 1.22 mmol/L (ref 1.15–1.40)
Chloride: 105 mmol/L (ref 98–111)
Creatinine, Ser: 1 mg/dL (ref 0.61–1.24)
Glucose, Bld: 106 mg/dL — ABNORMAL HIGH (ref 70–99)
HCT: 41 % (ref 39.0–52.0)
Hemoglobin: 13.9 g/dL (ref 13.0–17.0)
Potassium: 4.2 mmol/L (ref 3.5–5.1)
Sodium: 142 mmol/L (ref 135–145)
TCO2: 28 mmol/L (ref 22–32)

## 2021-05-30 LAB — CBG MONITORING, ED: Glucose-Capillary: 104 mg/dL — ABNORMAL HIGH (ref 70–99)

## 2021-05-30 LAB — CBC
HCT: 42.1 % (ref 39.0–52.0)
Hemoglobin: 13.4 g/dL (ref 13.0–17.0)
MCH: 28.6 pg (ref 26.0–34.0)
MCHC: 31.8 g/dL (ref 30.0–36.0)
MCV: 89.8 fL (ref 80.0–100.0)
Platelets: 172 10*3/uL (ref 150–400)
RBC: 4.69 MIL/uL (ref 4.22–5.81)
RDW: 12.4 % (ref 11.5–15.5)
WBC: 9.3 10*3/uL (ref 4.0–10.5)
nRBC: 0 % (ref 0.0–0.2)

## 2021-05-30 LAB — APTT: aPTT: 29 seconds (ref 24–36)

## 2021-05-30 MED ORDER — SODIUM CHLORIDE 0.9 % IV SOLN: INTRAVENOUS | Status: DC

## 2021-05-30 MED ORDER — SERTRALINE HCL 50 MG PO TABS
50.0000 mg | ORAL_TABLET | Freq: Every day | ORAL | Status: DC
  Administered 2021-05-31: 50 mg via ORAL
  Filled 2021-05-30: qty 1

## 2021-05-30 MED ORDER — STROKE: EARLY STAGES OF RECOVERY BOOK: Freq: Once | Status: DC

## 2021-05-30 MED ORDER — CLOPIDOGREL BISULFATE 75 MG PO TABS
75.0000 mg | ORAL_TABLET | Freq: Every day | ORAL | Status: DC
  Administered 2021-05-31 (×2): 75 mg via ORAL
  Filled 2021-05-30 (×2): qty 1

## 2021-05-30 MED ORDER — SODIUM CHLORIDE 0.9% FLUSH: 3.0000 mL | Freq: Once | INTRAVENOUS | Status: DC

## 2021-05-30 MED ORDER — ENOXAPARIN SODIUM 40 MG/0.4ML IJ SOSY: 40.0000 mg | PREFILLED_SYRINGE | INTRAMUSCULAR | Status: DC

## 2021-05-30 MED ORDER — MAGNESIUM HYDROXIDE 400 MG/5ML PO SUSP
30.0000 mL | Freq: Every day | ORAL | Status: DC | PRN
Start: 2021-05-30 — End: 2021-05-31

## 2021-05-30 MED ORDER — TRAZODONE HCL 50 MG PO TABS
25.0000 mg | ORAL_TABLET | Freq: Every evening | ORAL | Status: DC | PRN
Start: 2021-05-30 — End: 2021-05-31

## 2021-05-30 MED ORDER — GADOBUTROL 1 MMOL/ML IV SOLN
10.0000 mL | Freq: Once | INTRAVENOUS | Status: AC | PRN
  Administered 2021-05-30: 10 mL via INTRAVENOUS

## 2021-05-30 MED ORDER — SENNOSIDES-DOCUSATE SODIUM 8.6-50 MG PO TABS: 1.0000 | ORAL_TABLET | Freq: Every evening | ORAL | Status: DC | PRN

## 2021-05-30 MED ORDER — ACETAMINOPHEN 325 MG PO TABS: 650.0000 mg | ORAL_TABLET | ORAL | Status: DC | PRN

## 2021-05-30 MED ORDER — ONDANSETRON HCL 4 MG/2ML IJ SOLN: 4.0000 mg | INTRAMUSCULAR | Status: DC | PRN

## 2021-05-30 MED ORDER — LOSARTAN POTASSIUM 50 MG PO TABS
100.0000 mg | ORAL_TABLET | Freq: Every day | ORAL | Status: DC
  Administered 2021-05-31: 100 mg via ORAL
  Filled 2021-05-30: qty 2

## 2021-05-30 MED ORDER — ACETAMINOPHEN 650 MG RE SUPP: 650.0000 mg | RECTAL | Status: DC | PRN

## 2021-05-30 MED ORDER — ATORVASTATIN CALCIUM 40 MG PO TABS
80.0000 mg | ORAL_TABLET | Freq: Every day | ORAL | Status: DC
  Administered 2021-05-31: 80 mg via ORAL
  Filled 2021-05-30: qty 2

## 2021-05-30 MED ORDER — ADULT MULTIVITAMIN W/MINERALS CH
1.0000 | ORAL_TABLET | Freq: Every day | ORAL | Status: DC
  Administered 2021-05-31: 1 via ORAL
  Filled 2021-05-30: qty 1

## 2021-05-30 MED ORDER — ACETAMINOPHEN 160 MG/5ML PO SOLN: 650.0000 mg | ORAL | Status: DC | PRN

## 2021-05-30 MED ORDER — MONTELUKAST SODIUM 10 MG PO TABS
10.0000 mg | ORAL_TABLET | Freq: Every day | ORAL | Status: DC
  Administered 2021-05-31: 10 mg via ORAL
  Filled 2021-05-30 (×3): qty 1

## 2021-05-30 MED ORDER — ALBUTEROL SULFATE (2.5 MG/3ML) 0.083% IN NEBU: 3.0000 mL | INHALATION_SOLUTION | RESPIRATORY_TRACT | Status: DC | PRN

## 2021-05-30 MED ORDER — AMLODIPINE BESYLATE 5 MG PO TABS
10.0000 mg | ORAL_TABLET | Freq: Every day | ORAL | Status: DC
  Administered 2021-05-31: 10 mg via ORAL
  Filled 2021-05-30: qty 2

## 2021-05-30 MED ORDER — ASPIRIN EC 81 MG PO TBEC
81.0000 mg | DELAYED_RELEASE_TABLET | Freq: Every day | ORAL | Status: DC
  Administered 2021-05-31: 81 mg via ORAL
  Filled 2021-05-30: qty 1

## 2021-05-30 NOTE — Assessment & Plan Note (Signed)
-   We will continue Singulair.

## 2021-05-30 NOTE — ED Triage Notes (Addendum)
Pt arrived via POV for diplopia that started at 0830 today upon waking. Pt's LKW 0000 last night. Pt went to opthamologist today and was sent here for further work up to r/o stroke. The NIH and was neg. Pt strength is equal bilat upper and lower. Pt denies any other sx, although he said he had a HA around 1000 today. He said he took tylenol and it relieved his HA.

## 2021-05-30 NOTE — ED Notes (Signed)
Pt in MRI.

## 2021-05-30 NOTE — Assessment & Plan Note (Signed)
-   We will continue Zoloft 

## 2021-05-30 NOTE — Assessment & Plan Note (Signed)
-   This is manifested by carotid stenosis as mentioned above. - It is currently stable compared to August/2022. - The patient will be placed on aspirin and Plavix as mentioned above

## 2021-05-30 NOTE — ED Provider Triage Note (Signed)
Emergency Medicine Provider Triage Evaluation Note  RIVALDO HINEMAN , a 76 y.o. male  was evaluated in triage.  Pt complains of diplopia since 830 this morning.  He states the images are stacked on top of each other.  Was seen at ophthalmology and sent here to rule out stroke.  No focal weakness or numbness.  Review of Systems  Positive:  Negative: See above   Physical Exam  BP 123/76 (BP Location: Right Arm)    Pulse 82    Temp 98.9 F (37.2 C) (Oral)    Resp 14    Ht 6' (1.829 m)    Wt 117.1 kg    SpO2 97%    BMI 35.01 kg/m  Gen:   Awake, no distress   Resp:  Normal effort  MSK:   Moves extremities without difficulty  Other:  5/5 strength to the upper and lower extremities.  No obvious skew deviation on my exam.  Normal sensation to the upper and lower extremities.  Medical Decision Making  Medically screening exam initiated at 4:52 PM.  Appropriate orders placed.  ERHARD SENSKE was informed that the remainder of the evaluation will be completed by another provider, this initial triage assessment does not replace that evaluation, and the importance of remaining in the ED until their evaluation is complete.  I spoke with neurology who recommended against calling code stroke at this time given the timeframe.  CT without contrast of the head was ordered in addition to MRI and MRA   Hendricks Limes, [place] 05/30/21 1654

## 2021-05-30 NOTE — Assessment & Plan Note (Signed)
-   We will continue statin therapy and check fasting lipids. 

## 2021-05-30 NOTE — Assessment & Plan Note (Signed)
-   We will place him on as needed albuterol. - We will hold off long-acting beta agonist.

## 2021-05-30 NOTE — ED Provider Notes (Signed)
[facility]PART[facility] Provider Note   CSN: [id number] Arrival date & time: 05/30/21  1619     History  Chief Complaint  Patient presents with   Diplopia    MITUL HALLOWELL is a 76 y.o. male with history of hypertension, hyperlipidemia, carotid artery stenosis who presents to the ED from his ophthalmologist for evaluation of diplopia.  Patient states that when he woke up this morning he was having double vision.  He did visit his ophthalmologist who is concerned about skew deviation and referred him here.  Patient is otherwise feeling fine.  He noted he had a headache earlier but took Tylenol with resolution.  He denies weakness, fatigue, slurred speech, facial droop, chest pain, shortness of breath, abdominal pain, nausea, vomiting and diarrhea.  No known injury or trauma.  Patient had MR angio done in August 2022 which suggested occlusion or near occlusion of the left carotid artery.  He has no prior history of strokes, TIAs.  He does note that he was prescribed Plavix at the time and took 1 pill before reading the side effects and decided not to take it.  Currently takes baby aspirin daily. HPI     Home Medications Prior to Admission medications   Medication Sig Start Date End Date Taking? Authorizing Provider  albuterol (PROVENTIL HFA;VENTOLIN HFA) 108 (90 Base) MCG/ACT inhaler Inhale 2 puffs into the lungs every 4 (four) hours as needed for wheezing or shortness of breath (cough, shortness of breath or wheezing.). 03/30/16   Elby Beck, FNP  amLODipine (NORVASC) 10 MG tablet TAKE 1 TABLET BY MOUTH EVERY DAY FOR BLOOD PRESSURE 01/27/21   Pleas Koch, NP  aspirin EC 81 MG tablet Take 81 mg by mouth daily. Swallow whole.    [provider]  atorvastatin (LIPITOR) 80 MG tablet TAKE 1 TABLET BY MOUTH EVERY DAY FOR CHOLESTEROL 02/10/21   Pleas Koch, NP  cetirizine (ZYRTEC) 10 MG tablet TAKE 1 TABLET BY MOUTH DAILY AS NEEDED FOR  ALLERGIES 11/18/18   Pleas Koch, NP  fish oil-omega-3 fatty acids 1000 MG capsule Take 2 g by mouth daily.    [provider]  losartan (COZAAR) 100 MG tablet TAKE 1 TABLET BY MOUTH EVERY DAY FOR BLOOD PRESSURE 10/30/20   Pleas Koch, NP  montelukast (SINGULAIR) 10 MG tablet TAKE 1 TABLET (10 MG TOTAL) BY MOUTH AT BEDTIME. FOR ALLERGIES 09/03/20   Pleas Koch, NP  Multiple Vitamin (MULTIVITAMIN) tablet Take 1 tablet by mouth daily.    [provider]  sertraline (ZOLOFT) 50 MG tablet TAKE 1 TABLET BY MOUTH EVERY DAY FOR DEPRESSION 01/17/21   Pleas Koch, NP  SYMBICORT 160-4.5 MCG/ACT inhaler INHALE 2 PUFFS INTO THE LUNGS TWICE A DAY 07/31/18   Pleas Koch, NP      Allergies    Patient has no known allergies.    Review of Systems   Review of Systems  Physical Exam Updated Vital Signs BP (!) 148/70    Pulse (!) 59    Temp 98.9 F (37.2 C) (Oral)    Resp 16    Ht 6' (1.829 m)    Wt 117.1 kg    SpO2 100%    BMI 35.01 kg/m  Physical Exam Vitals and nursing note reviewed.  Constitutional:      General: He is not in acute distress.    Appearance: He is not ill-appearing.  HENT:     Head: Atraumatic.  Eyes:     Conjunctiva/sclera: Conjunctivae normal.     Comments: Right pupil round and reactive to light.  Left pupil was dilated, with scleral injection.  Patient does note that he had pupils dilated at ophthalmologist earlier today  Cardiovascular:     Rate and Rhythm: Normal rate and regular rhythm.     Pulses: Normal pulses.     Heart sounds: No murmur heard. Pulmonary:     Effort: Pulmonary effort is normal. No respiratory distress.     Breath sounds: Normal breath sounds.  Abdominal:     General: Abdomen is flat. There is no distension.     Palpations: Abdomen is soft.     Tenderness: There is no abdominal tenderness.  Musculoskeletal:        General: Normal range of motion.     Cervical back: Normal range of motion.  Skin:     General: Skin is warm and dry.     Capillary Refill: Capillary refill takes less than 2 seconds.  Neurological:     General: No focal deficit present.     Mental Status: He is alert.     Comments: Speech is clear, able to follow commands CN III-XII intact Normal strength in upper and lower extremities bilaterally including dorsiflexion and plantar flexion, strong and equal grip strength Sensation normal to light and sharp touch Moves extremities without ataxia, coordination intact Normal finger to nose and rapid alternating movements No pronator drift    Psychiatric:        Mood and Affect: Mood normal.    ED Results / Procedures / Treatments   Labs (all labs ordered are listed, but only abnormal results are displayed) Labs Reviewed  COMPREHENSIVE METABOLIC PANEL - Abnormal; Notable for the following components:      Result Value   Glucose, Bld 114 (*)    All other components within normal limits  I-STAT CHEM 8, ED - Abnormal; Notable for the following components:   Glucose, Bld 106 (*)    All other components within normal limits  CBG MONITORING, ED - Abnormal; Notable for the following components:   Glucose-Capillary 104 (*)    All other components within normal limits  PROTIME-INR  APTT  CBC  DIFFERENTIAL    EKG EKG Interpretation  Date/Time:  Tuesday May 30 2021 16:36:13 EST Ventricular Rate:  85 PR Interval:  188 QRS Duration: 80 QT Interval:  358 QTC Calculation: 426 R Axis:   10 Text Interpretation: Normal sinus rhythm Normal ECG When compared with ECG of 07-Nov-2020 12:06, No acute changes Confirmed by Madalyn Rob [phone]) on 05/30/2021 6:55:21 PM  Radiology CT Head Wo Contrast  Result Date: 05/30/2021 CLINICAL DATA:  Diplopia starting at 8:30 today.  Recent headache. EXAM: CT HEAD WITHOUT CONTRAST TECHNIQUE: Contiguous axial images were obtained from the base of the skull through the vertex without intravenous contrast. RADIATION DOSE REDUCTION:  This exam was performed according to the departmental dose-optimization program which includes automated exposure control, adjustment of the mA and/or kV according to patient size and/or use of iterative reconstruction technique. COMPARISON:  11/18/2020 and MRI brain from 11/11/2020 FINDINGS: Brain: Chronic mild encephalomalacia along the medial anterior frontal lobes bilaterally, unchanged. Otherwise, the brainstem, cerebellum, cerebral peduncles, thalamus, basal ganglia, basilar cisterns, and ventricular system appear within normal limits. No intracranial hemorrhage, mass lesion, or acute CVA. Vascular: There is atherosclerotic calcification of the cavernous carotid arteries bilaterally. Skull: Unremarkable Sinuses/Orbits: Unremarkable Other: Unremarkable IMPRESSION: 1. No acute intracranial findings. 2. 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 3. Atherosclerosis. Electronically Signed   By: Van Clines M.D.   On: 05/30/2021 17:32   MR ANGIO HEAD WO CONTRAST  Result Date: 05/30/2021 CLINICAL DATA:  Diplopia.  Rule out stroke EXAM: MRI HEAD WITHOUT AND WITH CONTRAST MRA HEAD WITHOUT CONTRAST MRA NECK WITHOUT AND WITH CONTRAST TECHNIQUE: Multiplanar, multiecho pulse sequences of the brain and surrounding structures were obtained without and with intravenous contrast. Angiographic images of the Circle of Willis were obtained using MRA technique without intravenous contrast. Angiographic images of the neck were obtained using MRA technique without and with intravenous contrast. Carotid stenosis measurements (when applicable) are obtained utilizing NASCET criteria, using the distal internal carotid diameter as the denominator. CONTRAST:  60mL GADAVIST GADOBUTROL 1 MMOL/ML IV SOLN COMPARISON:  CT head 05/30/2021 FINDINGS: MRI HEAD FINDINGS Brain: Negative for acute infarct. Mild white matter changes consistent with chronic microvascular ischemia. Brainstem  and cerebellum intact. Negative for hemorrhage, mass, or fluid collection. Ventricle size and cerebral volume normal for age. Normal enhancement of the brain postcontrast administration. Vascular: Loss of flow void left internal carotid artery through the skull base and cavernous segment compatible with occlusion. Otherwise normal arterial flow voids. Skull and upper cervical spine: No focal skeletal lesion. Sinuses/Orbits: Mild mucosal edema paranasal sinuses. Bilateral cataract extraction Other: None MRA HEAD FINDINGS Minimal flow left internal carotid artery. Small amount of flow related signal in the left internal carotid artery in the neck and cavernous carotid. This is similar to prior CTA of 11/18/2020. Right internal carotid artery widely patent. Anterior and middle cerebral arteries patent bilaterally without stenosis or large vessel occlusion. Negative for aneurysm. Internal carotid artery patent bilaterally. Right PICA patent. Left PICA not visualized. Left AICA patent. Basilar widely patent. Superior cerebellar and posterior cerebral arteries patent. Fetal origin left posterior cerebral artery. Negative for aneurysm. MRA NECK FINDINGS Normal aortic arch. Innominate artery widely patent. Left subclavian artery widely patent Moderate stenosis at the origin of the left common carotid artery. Left common carotid artery is a small vessel with slow flow. This vessel appears to occlude near the carotid bifurcation however there could be a small branch patent. Left internal carotid artery may be occluded or have slow flow. This is difficult to determine due to enhancing collateral vessels in the neck. Both vertebral arteries are patent without stenosis in the neck. IMPRESSION: 1. Negative for acute infarct. Mild chronic microvascular ischemic change in the white matter. 2. 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 Possible occlusion of the distal left common carotid artery. Possible occlusion  versus minimal flow left internal carotid artery. The MRA head suggests there is a small amount of flow in the left internal carotid artery through the skull base. This is similar to the prior CT angiogram 11/18/2020. Electronically Signed   By: Franchot Gallo M.D.   On: 05/30/2021 19:48   MR Angiogram Neck W or Wo Contrast  Result Date: 05/30/2021 CLINICAL DATA:  Diplopia.  Rule out stroke EXAM: MRI HEAD WITHOUT AND WITH CONTRAST MRA HEAD WITHOUT CONTRAST MRA NECK WITHOUT AND WITH CONTRAST TECHNIQUE: Multiplanar, multiecho pulse sequences of the brain and surrounding structures were obtained without and with intravenous contrast. Angiographic images of the Circle of Willis were obtained using MRA technique without intravenous contrast. Angiographic images of the neck were obtained using MRA technique without and with intravenous contrast. Carotid stenosis measurements (when applicable) are obtained utilizing NASCET criteria, using the distal internal carotid  diameter as the denominator. CONTRAST:  70mL GADAVIST GADOBUTROL 1 MMOL/ML IV SOLN COMPARISON:  CT head 05/30/2021 FINDINGS: MRI HEAD FINDINGS Brain: Negative for acute infarct. Mild white matter changes consistent with chronic microvascular ischemia. Brainstem and cerebellum intact. Negative for hemorrhage, mass, or fluid collection. Ventricle size and cerebral volume normal for age. Normal enhancement of the brain postcontrast administration. Vascular: Loss of flow void left internal carotid artery through the skull base and cavernous segment compatible with occlusion. Otherwise normal arterial flow voids. Skull and upper cervical spine: No focal skeletal lesion. Sinuses/Orbits: Mild mucosal edema paranasal sinuses. Bilateral cataract extraction Other: None MRA HEAD FINDINGS Minimal flow left internal carotid artery. Small amount of flow related signal in the left internal carotid artery in the neck and cavernous carotid. This is similar to prior CTA of  11/18/2020. Right internal carotid artery widely patent. Anterior and middle cerebral arteries patent bilaterally without stenosis or large vessel occlusion. Negative for aneurysm. Internal carotid artery patent bilaterally. Right PICA patent. Left PICA not visualized. Left AICA patent. Basilar widely patent. Superior cerebellar and posterior cerebral arteries patent. Fetal origin left posterior cerebral artery. Negative for aneurysm. MRA NECK FINDINGS Normal aortic arch. Innominate artery widely patent. Left subclavian artery widely patent Moderate stenosis at the origin of the left common carotid artery. Left common carotid artery is a small vessel with slow flow. This vessel appears to occlude near the carotid bifurcation however there could be a small branch patent. Left internal carotid artery may be occluded or have slow flow. This is difficult to determine due to enhancing collateral vessels in the neck. Both vertebral arteries are patent without stenosis in the neck. IMPRESSION: 1. Negative for acute infarct. Mild chronic microvascular ischemic change in the white matter. 2. 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 Possible occlusion of the distal left common carotid artery. Possible occlusion versus minimal flow left internal carotid artery. The MRA head suggests there is a small amount of flow in the left internal carotid artery through the skull base. This is similar to the prior CT angiogram 11/18/2020. Electronically Signed   By: Franchot Gallo M.D.   On: 05/30/2021 19:48   MR BRAIN W WO CONTRAST  Result Date: 05/30/2021 CLINICAL DATA:  Diplopia.  Rule out stroke EXAM: MRI HEAD WITHOUT AND WITH CONTRAST MRA HEAD WITHOUT CONTRAST MRA NECK WITHOUT AND WITH CONTRAST TECHNIQUE: Multiplanar, multiecho pulse sequences of the brain and surrounding structures were obtained without and with intravenous contrast. Angiographic images of the Circle of Willis were obtained using MRA technique  without intravenous contrast. Angiographic images of the neck were obtained using MRA technique without and with intravenous contrast. Carotid stenosis measurements (when applicable) are obtained utilizing NASCET criteria, using the distal internal carotid diameter as the denominator. CONTRAST:  57mL GADAVIST GADOBUTROL 1 MMOL/ML IV SOLN COMPARISON:  CT head 05/30/2021 FINDINGS: MRI HEAD FINDINGS Brain: Negative for acute infarct. Mild white matter changes consistent with chronic microvascular ischemia. Brainstem and cerebellum intact. Negative for hemorrhage, mass, or fluid collection. Ventricle size and cerebral volume normal for age. Normal enhancement of the brain postcontrast administration. Vascular: Loss of flow void left internal carotid artery through the skull base and cavernous segment compatible with occlusion. Otherwise normal arterial flow voids. Skull and upper cervical spine: No focal skeletal lesion. Sinuses/Orbits: Mild mucosal edema paranasal sinuses. Bilateral cataract extraction Other: None MRA HEAD FINDINGS Minimal flow left internal carotid artery. Small amount of flow related signal in the left internal carotid artery  in the neck and cavernous carotid. This is similar to prior CTA of 11/18/2020. Right internal carotid artery widely patent. Anterior and middle cerebral arteries patent bilaterally without stenosis or large vessel occlusion. Negative for aneurysm. Internal carotid artery patent bilaterally. Right PICA patent. Left PICA not visualized. Left AICA patent. Basilar widely patent. Superior cerebellar and posterior cerebral arteries patent. Fetal origin left posterior cerebral artery. Negative for aneurysm. MRA NECK FINDINGS Normal aortic arch. Innominate artery widely patent. Left subclavian artery widely patent Moderate stenosis at the origin of the left common carotid artery. Left common carotid artery is a small vessel with slow flow. This vessel appears to occlude near the carotid  bifurcation however there could be a small branch patent. Left internal carotid artery may be occluded or have slow flow. This is difficult to determine due to enhancing collateral vessels in the neck. Both vertebral arteries are patent without stenosis in the neck. IMPRESSION: 1. Negative for acute infarct. Mild chronic microvascular ischemic change in the white matter. 2. 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 Possible occlusion of the distal left common carotid artery. Possible occlusion versus minimal flow left internal carotid artery. The MRA head suggests there is a small amount of flow in the left internal carotid artery through the skull base. This is similar to the prior CT angiogram 11/18/2020. Electronically Signed   By: Franchot Gallo M.D.   On: 05/30/2021 19:48    Procedures .Critical Care Performed by: Tonye Pearson, PA-C Authorized by: Tonye Pearson, PA-C   Critical care provider statement:    Critical care time (minutes):  30   Critical care start time:  05/30/2021 7:30 PM   Critical care end time:  05/30/2021 8:00 PM   Critical care was necessary to treat or prevent imminent or life-threatening deterioration of the following conditions:  CNS failure or compromise   Critical care was time spent personally by me on the following activities:  Development of treatment plan with patient or surrogate, discussions with consultants, evaluation of patient's response to treatment, examination of patient, ordering and review of laboratory studies, ordering and review of radiographic studies, ordering and performing treatments and interventions, pulse oximetry, re-evaluation of patient's condition, review of old charts and obtaining history from patient or surrogate   I assumed direction of critical care for this patient from another provider in my specialty: no      Medications Ordered in ED Medications  sodium chloride flush (NS) 0.9 % injection 3 mL (3 mLs  Intravenous Not Given 05/30/21 1958)  gadobutrol (GADAVIST) 1 MMOL/ML injection 10 mL (10 mLs Intravenous Contrast Given 05/30/21 1930)    ED Course/ Medical Decision Making/ A&P                           Medical Decision Making Amount and/or Complexity of Data Reviewed Labs: ordered.  Risk Decision regarding hospitalization.   History:  Per HPI Social determinants of health: none  Initial impression:  This patient presents to the ED for concern of diplopia, this involves an extensive number of treatment options, and is a complaint that carries with it a high risk of complications and morbidity.  Differentials include intracranial bleeding, hemorrhage, stroke, TIA   This is a 76 year old male in no acute distress,.  Vitals are normal, work-up overall benign.  Normal work-up exam.  He is squinting his left eye when talking to me in order to his double vision.  Otherwise no focal deficits.  CT head without intracranial findings.  Will order MRI and an angiogram.  I will also order labs   Lab Tests and EKG:  I Ordered, reviewed, and interpreted labs and EKG.  The pertinent results include:  CMP normal CBC without leukocytosis   Imaging Studies ordered:  I ordered imaging studies including  CT head without intracranial findings MR angiogram without acute infarct.  There is minimal flow of the left internal carotid artery as previously seen in August 2022 I independently visualized and interpreted imaging and I agree with the radiologist interpretation.    Cardiac Monitoring:  The patient was maintained on a cardiac monitor.  I personally viewed and interpreted the cardiac monitored which showed an underlying rhythm of: NSR   Critical Interventions:  Stroke work-up and prevention as described above  Consultations Obtained:  I requested consultation with Dr. Theda Sers with neurology,  and discussed lab and imaging findings as well as pertinent plan - they recommend: Patient  be admitted for full stroke work-up.  Advises to admit to medicine and he will consult. Additionally, my attending Dr. Reubin Milan spoke with ophthalmology consult, Dr. Katy Fitch, who also agrees that patient should be admitted for full stroke workup   ED Course: Patient presents with chief complaint of diplopia since he woke up this morning.  Last known well at 12:30 AM last night.  CT scan without acute abnormalities.  MRI without any acute infarcts although minimal flow left carotid artery occlusion noted, similar to previous work-up.  Given patient's continued diplopia, consultations with both neurology and ophthalmology suggest that patient should be admitted for further work-up.  Lab work all reassuring  Disposition:  After consideration of the diagnostic results, physical exam, history and the patients response to treatment feel that the patent would benefit from admission.   Diplopia: Dr. Eugenie Norrie agrees to admit patient.  Ultimate treatment and disposition to be determined by admitting provider.    Final Clinical Impression(s) / ED Diagnoses Final diagnoses:  Diplopia    Rx / DC Orders ED Discharge Orders     None         Rodena Piety 05/30/21 2044    Lucrezia Starch, MD 06/03/21 [phone]

## 2021-05-30 NOTE — Consult Note (Signed)
Neurology Consult H&P  Alan [name] MR# [id number] 05/30/2021   CC: diplopia  History is obtained from: Patient and chart.  HPI: Alan [name] is a 76 y.o. male PMHx as reviewed below woke this morning 05/30/2021 with double vision.  He was seen by ophthalmology thought to have skew deviation which is suspicious for stroke and directed to the ED for further evaluation.  He has never had this before he denies dizziness visual loss nausea vomiting.  11/11/2020 complained of dizziness with visual changes found to have left ICA occlusion started on aspirin Plavix however only took Plavix once and discontinued after he read the possible side effects.   LKW: Unclear tNK given: No OS W IR Thrombectomy No, not indicated Modified Rankin Scale: 0-Completely asymptomatic and back to baseline post- stroke NIHSS: 0  ROS: A complete ROS was performed and is negative except as noted in the HPI.   Past Medical History:  Diagnosis Date   Allergic rhinitis, cause unspecified    Allergy    Cancer ([place])    Basal cell skin cancer,left ear   Carotid artery stenosis    left   Carpal tunnel syndrome    Chronic airway obstruction, not elsewhere classified    Esophageal reflux    Glaucoma (increased eye pressure)    Lipoma of unspecified site    Osteoarthrosis, unspecified whether generalized or localized, lower leg    Other and unspecified hyperlipidemia    Personal history of colonic polyps    Personal history of other malignant neoplasm of skin    Unspecified essential hypertension      Family History  Problem Relation Age of Onset   Diabetes Mother    Hyperlipidemia Mother    Hypertension Mother    Heart attack Mother    Obesity Mother    Hyperlipidemia Father    Hypertension Father    Lung cancer Father    Colon polyps Father    Hyperlipidemia Brother    Hypertension Brother    Hyperlipidemia Brother    Hypertension Brother    Hypertension Sister    Hyperlipidemia Sister     Lung cancer Sister    Hyperlipidemia Sister    Drug abuse Daughter    Colon cancer Neg Hx    Esophageal cancer Neg Hx    Pancreatic cancer Neg Hx    Rectal cancer Neg Hx     Social History:  reports that he quit smoking about 24 years ago. His smoking use included cigarettes. He has a 75.00 pack-year smoking history. He has never used smokeless tobacco. He reports that he does not currently use alcohol. He reports that he does not use drugs.   Prior to Admission medications   Medication Sig Start Date End Date Taking? Authorizing Provider  albuterol (PROVENTIL HFA;VENTOLIN HFA) 108 (90 Base) MCG/ACT inhaler Inhale 2 puffs into the lungs every 4 (four) hours as needed for wheezing or shortness of breath (cough, shortness of breath or wheezing.). 03/30/16   Elby Beck, FNP  amLODipine (NORVASC) 10 MG tablet TAKE 1 TABLET BY MOUTH EVERY DAY FOR BLOOD PRESSURE 01/27/21   Pleas Koch, NP  aspirin EC 81 MG tablet Take 81 mg by mouth daily. Swallow whole.    [provider]  atorvastatin (LIPITOR) 80 MG tablet TAKE 1 TABLET BY MOUTH EVERY DAY FOR CHOLESTEROL 02/10/21   Pleas Koch, NP  cetirizine (ZYRTEC) 10 MG tablet TAKE 1 TABLET BY MOUTH DAILY AS NEEDED FOR ALLERGIES 11/18/18  Pleas Koch, NP  fish oil-omega-3 fatty acids 1000 MG capsule Take 2 g by mouth daily.    [provider]  losartan (COZAAR) 100 MG tablet TAKE 1 TABLET BY MOUTH EVERY DAY FOR BLOOD PRESSURE 10/30/20   Pleas Koch, NP  montelukast (SINGULAIR) 10 MG tablet TAKE 1 TABLET (10 MG TOTAL) BY MOUTH AT BEDTIME. FOR ALLERGIES 09/03/20   Pleas Koch, NP  Multiple Vitamin (MULTIVITAMIN) tablet Take 1 tablet by mouth daily.    [provider]  sertraline (ZOLOFT) 50 MG tablet TAKE 1 TABLET BY MOUTH EVERY DAY FOR DEPRESSION 01/17/21   Pleas Koch, NP  SYMBICORT 160-4.5 MCG/ACT inhaler INHALE 2 PUFFS INTO THE LUNGS TWICE A DAY 07/31/18   Pleas Koch, NP     Exam: Current vital signs: BP (!) 148/70    Pulse (!) 59    Temp 98.9 F (37.2 C) (Oral)    Resp 16    Ht 6' (1.829 m)    Wt 117.1 kg    SpO2 100%    BMI 35.01 kg/m   Physical Exam  Constitutional: Appears well-developed and well-nourished.  Psych: Affect appropriate to situation Eyes: No scleral injection HENT: No OP obstruction. Head: Normocephalic.  Cardiovascular: Normal rate and regular rhythm.  Respiratory: Effort normal, symmetric excursions bilaterally, no audible wheezing. GI: Soft.  No distension. There is no tenderness.  Skin: WDI  Neuro: Mental Status: Patient is awake, alert, oriented to person, place, month, year, and situation. Patient is able to give a clear and coherent history. Speech fluent, intact comprehension and repetition. No signs of aphasia or neglect. Visual Fields are full. Pupils are equal, round, and reactive to light. EOMI OS mild exotropia on primary gaze.  Diplopia in the vertical plane when looking up which worsens on left gaze. Facial sensation is symmetric to temperature Facial movement is symmetric.  Hearing is intact to voice. Uvula midline and palate elevates symmetrically. Shoulder shrug is symmetric. Tongue is midline without atrophy or fasciculations.  Tone is normal. Bulk is normal. 5/5 strength was present in all four extremities. Sensation is symmetric to light touch and temperature in the arms and legs. Deep Tendon Reflexes: 2+ and symmetric in the biceps and patellae. Toes are downgoing bilaterally. FNF and HKS are intact bilaterally. Gait - Deferred  I have reviewed labs in epic and the pertinent results are: BG 106  I have reviewed the images obtained: MRI brain negative for acute infarct. Mild chronic microvascular ischemic change in the white matter. MRA head and neck slow flow left common carotid artery with proximal stenosis. Possible occlusion of the distal left common carotid artery. Possible occlusion versus  minimal flow left internal carotid artery. The MRA head suggests there is a small amount of flow in the left internal carotid artery through the skull base similar to the prior CT angiogram 11/18/2020.  Assessment: Alan [name] is a 76 y.o. male PMHx as noted above, left ICA occlusion woke with double vision, seen by ophthalmology referred to ED for further stroke work-up.  Exam with new onset painless diplopia and MR imaging without aneurysm highly suggestive of brainstem stroke despite MR imaging being negative.  He was supposed to be on aspirin and Plavix however, after reading the possible side effects of Plavix he only took 1 dose and discontinued (though he did continue aspirin 81 mg).  He will need to be admitted for further stroke work-up.  Discussed clopidogrel with the patient and he is  willing to restart.  Recommended aspirin 324mg  now.  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 - Recommend Statin for goal LDL <70. - Goal A1c <7. - Aspirin 81mg  daily. - Clopidogrel 75mg  daily for 3 weeks. - SBP goal <150. - Telemetry monitoring for arrhythmia. - Recommend bedside Swallow screen. - Recommend Stroke education. - Recommend PT/OT/SLP consult.  Electronically signed by:  Lynnae Sandhoff, MD Page: [phone] 05/30/2021, 8:08 PM  If 7pm- 7am, please page neurology on call as listed in [place].

## 2021-05-30 NOTE — Assessment & Plan Note (Signed)
-   We will continue Cozaar and amlodipine with permissive parameters.

## 2021-05-30 NOTE — Assessment & Plan Note (Addendum)
-   The patient will be placed in an observation telemetry bed for further assessment for possible TIA. - Brain MRI and MRA are negative for acute CVA however shows peripheral vascular disease with left common carotid artery slow flow and proximal stenosis and possible occlusion of the distal left common carotid artery with possible occlusion versus minimal flow of the left internal carotid artery.  Changes are similar to the previous CTA on 11/18/2020. - We will add Plavix to his aspirin. - We will obtain a 2D echo with bubble study in a.m. for further assessment. - Neurology consult will be obtained. - The case was discussed with Dr. Theda Sers who evaluated the patient.

## 2021-05-30 NOTE — H&P (Signed)
[facility]   PATIENT NAME: Alan [name]    MR#:  [id number]  DATE OF BIRTH:  Jun 16, 1945  DATE OF ADMISSION:  05/30/2021  PRIMARY CARE PHYSICIAN: Pleas Koch, NP   Patient is coming from: Home  REQUESTING/REFERRING PHYSICIAN: Tonye Pearson, PA-C  CHIEF COMPLAINT:   Chief Complaint  Patient presents with   Diplopia    HISTORY OF PRESENT ILLNESS:  Alan [name] is a 76 y.o. Caucasian male with medical history significant for left carotid artery stenosis, GERD, COPD, osteoarthritis, dyslipidemia and hypertension, who presented to the ER with acute onset of diplopia that he woke up with today.  He was seen by ophthalmology and was thought to have skew deviation suspicious for stroke for which she was sent to the ED..  The patient had headache earlier and took Tylenol with resolution but denied any current headache or dizziness.  No paresthesias or focal muscle weakness.  No tinnitus or vertigo.  No nausea or vomiting or abdominal pain.  No chest pain or palpitations.  No bleeding diathesis.  ED Course: When he came to the ED, vital signs were within normal.  Labs revealed unremarkable CMP and CBC. EKG as reviewed by me : EKG showed normal sinus rhythm with a rate of 85.  Imaging: Noncontrast head CT scan revealed no acute intracranial abnormalities.  It showed a chronic stable medial inferior frontal lobe encephalomalacia likely from remote injury as well as atherosclerosis. Brain MRI with MRA of the head and neck revealed mild chronic microvascular ischemic change in the white matter with no acute infarct.  It showed slow flow in the left common carotid artery with proximal stenosis and possible occlusion of the distal left common carotid artery and possible occlusion versus minimal flow in the left internal carotid artery through the skull base which is similar to a prior CT angiogram on 11/18/2020.  Dr. Theda Sers with neurology was contacted as well as Dr. Carolynn Sayers with  ophthalmology and recommendation was for hospitalization and overnight observation with work-up for stroke.  The patient will be admitted to an observation medical telemetry bed for further evaluation and management. PAST MEDICAL HISTORY:   Past Medical History:  Diagnosis Date   Allergic rhinitis, cause unspecified    Allergy    Cancer ([place])    Basal cell skin cancer,left ear   Carotid artery stenosis    left   Carpal tunnel syndrome    Chronic airway obstruction, not elsewhere classified    Esophageal reflux    Glaucoma (increased eye pressure)    Lipoma of unspecified site    Osteoarthrosis, unspecified whether generalized or localized, lower leg    Other and unspecified hyperlipidemia    Personal history of colonic polyps    Personal history of other malignant neoplasm of skin    Unspecified essential hypertension     PAST SURGICAL HISTORY:   Past Surgical History:  Procedure Laterality Date   bone spur  06-2003   right thumb   CATARACT EXTRACTION     KNEE ARTHROSCOPY  09-2007   partial medial mesicecotmy, patellar chonfroplasty   SHOULDER SURGERY  03-24-2007   removal bone spur   SHOULDER SURGERY Left 07/2016   [facility], Dr. Harmon Pier   TOTAL KNEE ARTHROPLASTY     03-2009 [facility]   TOTAL KNEE ARTHROPLASTY Left 12/2012   [facility]    SOCIAL HISTORY:   Social History   Tobacco Use   Smoking status: Former  Packs/day: 3.00    Years: 25.00    Pack years: 75.00    Types: Cigarettes    Quit date: 11/23/1996    Years since quitting: 24.5   Smokeless tobacco: Never  Substance Use Topics   Alcohol use: Not Currently    Alcohol/week: 0.0 standard drinks    FAMILY HISTORY:   Family History  Problem Relation Age of Onset   Diabetes Mother    Hyperlipidemia Mother    Hypertension Mother    Heart attack Mother    Obesity Mother    Hyperlipidemia Father    Hypertension Father    Lung cancer Father    Colon polyps Father    Hyperlipidemia  Brother    Hypertension Brother    Hyperlipidemia Brother    Hypertension Brother    Hypertension Sister    Hyperlipidemia Sister    Lung cancer Sister    Hyperlipidemia Sister    Drug abuse Daughter    Colon cancer Neg Hx    Esophageal cancer Neg Hx    Pancreatic cancer Neg Hx    Rectal cancer Neg Hx     DRUG ALLERGIES:  No Known Allergies  REVIEW OF SYSTEMS:   ROS As per history of present illness. All pertinent systems were reviewed above. Constitutional, HEENT, cardiovascular, respiratory, GI, GU, musculoskeletal, neuro, psychiatric, endocrine, integumentary and hematologic systems were reviewed and are otherwise negative/unremarkable except for positive findings mentioned above in the HPI.   MEDICATIONS AT HOME:   Prior to Admission medications   Medication Sig Start Date End Date Taking? Authorizing Provider  albuterol (PROVENTIL HFA;VENTOLIN HFA) 108 (90 Base) MCG/ACT inhaler Inhale 2 puffs into the lungs every 4 (four) hours as needed for wheezing or shortness of breath (cough, shortness of breath or wheezing.). 03/30/16   Elby Beck, FNP  amLODipine (NORVASC) 10 MG tablet TAKE 1 TABLET BY MOUTH EVERY DAY FOR BLOOD PRESSURE 01/27/21   Pleas Koch, NP  aspirin EC 81 MG tablet Take 81 mg by mouth daily. Swallow whole.    [provider]  atorvastatin (LIPITOR) 80 MG tablet TAKE 1 TABLET BY MOUTH EVERY DAY FOR CHOLESTEROL 02/10/21   Pleas Koch, NP  cetirizine (ZYRTEC) 10 MG tablet TAKE 1 TABLET BY MOUTH DAILY AS NEEDED FOR ALLERGIES 11/18/18   Pleas Koch, NP  fish oil-omega-3 fatty acids 1000 MG capsule Take 2 g by mouth daily.    [provider]  losartan (COZAAR) 100 MG tablet TAKE 1 TABLET BY MOUTH EVERY DAY FOR BLOOD PRESSURE 10/30/20   Pleas Koch, NP  montelukast (SINGULAIR) 10 MG tablet TAKE 1 TABLET (10 MG TOTAL) BY MOUTH AT BEDTIME. FOR ALLERGIES 09/03/20   Pleas Koch, NP  Multiple Vitamin (MULTIVITAMIN)  tablet Take 1 tablet by mouth daily.    [provider]  sertraline (ZOLOFT) 50 MG tablet TAKE 1 TABLET BY MOUTH EVERY DAY FOR DEPRESSION 01/17/21   Pleas Koch, NP  SYMBICORT 160-4.5 MCG/ACT inhaler INHALE 2 PUFFS INTO THE LUNGS TWICE A DAY 07/31/18   Pleas Koch, NP      VITAL SIGNS:  Blood pressure (!) 148/70, pulse (!) 59, temperature 98.9 F (37.2 C), temperature source Oral, resp. rate 16, height 6' (1.829 m), weight 117.1 kg, SpO2 100 %.  PHYSICAL EXAMINATION:  Physical Exam  GENERAL:  75 y.o.-year-old Caucasian male patient lying in the bed with no acute distress.  EYES: Pupils equal, round, reactive to light and accommodation. No scleral icterus.  Extraocular muscles intact.  HEENT: Head atraumatic, normocephalic. Oropharynx and nasopharynx clear.  NECK:  Supple, no jugular venous distention. No thyroid enlargement, no tenderness.  LUNGS: Normal breath sounds bilaterally, no wheezing, rales,rhonchi or crepitation. No use of accessory muscles of respiration.  CARDIOVASCULAR: Regular rate and rhythm, S1, S2 normal. No murmurs, rubs, or gallops.  ABDOMEN: Soft, nondistended, nontender. Bowel sounds present. No organomegaly or mass.  EXTREMITIES: No pedal edema, cyanosis, or clubbing.  NEUROLOGIC: Cranial nerves II through XII are intact.  No appreciated visual field defects.  No facial droop.  Muscle strength 5/5 in all extremities.  No pronator drift.  Sensation intact. Gait not checked.  PSYCHIATRIC: The patient is alert and oriented x 3.  Normal affect and good eye contact. SKIN: No obvious rash, lesion, or ulcer.   LABORATORY PANEL:   CBC Recent Labs  Lab 05/30/21 1652 05/30/21 1813  WBC 9.3  --   HGB 13.4 13.9  HCT 42.1 41.0  PLT 172  --    ------------------------------------------------------------------------------------------------------------------  Chemistries  Recent Labs  Lab 05/30/21 1652 05/30/21 1813  NA 138 142  K 4.2 4.2  CL  103 105  CO2 25  --   GLUCOSE 114* 106*  BUN 19 23  CREATININE 1.00 1.00  CALCIUM 9.4  --   AST 18  --   ALT 22  --   ALKPHOS 56  --   BILITOT 0.5  --    ------------------------------------------------------------------------------------------------------------------  Cardiac Enzymes No results for input(s): TROPONINI in the last 168 hours. ------------------------------------------------------------------------------------------------------------------  RADIOLOGY:  CT Head Wo Contrast  Result Date: 05/30/2021 CLINICAL DATA:  Diplopia starting at 8:30 today.  Recent headache. EXAM: CT HEAD WITHOUT CONTRAST TECHNIQUE: Contiguous axial images were obtained from the base of the skull through the vertex without intravenous contrast. RADIATION DOSE REDUCTION: This exam was performed according to the departmental dose-optimization program which includes automated exposure control, adjustment of the mA and/or kV according to patient size and/or use of iterative reconstruction technique. COMPARISON:  11/18/2020 and MRI brain from 11/11/2020 FINDINGS: Brain: Chronic mild encephalomalacia along the medial anterior frontal lobes bilaterally, unchanged. Otherwise, the brainstem, cerebellum, cerebral peduncles, thalamus, basal ganglia, basilar cisterns, and ventricular system appear within normal limits. No intracranial hemorrhage, mass lesion, or acute CVA. Vascular: There is atherosclerotic calcification of the cavernous carotid arteries bilaterally. Skull: Unremarkable Sinuses/Orbits: Unremarkable Other: Unremarkable IMPRESSION: 1. No acute intracranial findings. 2. 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 3. Atherosclerosis. Electronically Signed   By: Van Clines M.D.   On: 05/30/2021 17:32   MR ANGIO HEAD WO CONTRAST  Result Date: 05/30/2021 CLINICAL DATA:  Diplopia.  Rule out stroke EXAM: MRI HEAD WITHOUT AND WITH CONTRAST MRA HEAD WITHOUT  CONTRAST MRA NECK WITHOUT AND WITH CONTRAST TECHNIQUE: Multiplanar, multiecho pulse sequences of the brain and surrounding structures were obtained without and with intravenous contrast. Angiographic images of the Circle of Willis were obtained using MRA technique without intravenous contrast. Angiographic images of the neck were obtained using MRA technique without and with intravenous contrast. Carotid stenosis measurements (when applicable) are obtained utilizing NASCET criteria, using the distal internal carotid diameter as the denominator. CONTRAST:  53mL GADAVIST GADOBUTROL 1 MMOL/ML IV SOLN COMPARISON:  CT head 05/30/2021 FINDINGS: MRI HEAD FINDINGS Brain: Negative for acute infarct. Mild white matter changes consistent with chronic microvascular ischemia. Brainstem and cerebellum intact. Negative for hemorrhage, mass, or fluid collection. Ventricle size and cerebral volume normal for age. Normal enhancement of the  brain postcontrast administration. Vascular: Loss of flow void left internal carotid artery through the skull base and cavernous segment compatible with occlusion. Otherwise normal arterial flow voids. Skull and upper cervical spine: No focal skeletal lesion. Sinuses/Orbits: Mild mucosal edema paranasal sinuses. Bilateral cataract extraction Other: None MRA HEAD FINDINGS Minimal flow left internal carotid artery. Small amount of flow related signal in the left internal carotid artery in the neck and cavernous carotid. This is similar to prior CTA of 11/18/2020. Right internal carotid artery widely patent. Anterior and middle cerebral arteries patent bilaterally without stenosis or large vessel occlusion. Negative for aneurysm. Internal carotid artery patent bilaterally. Right PICA patent. Left PICA not visualized. Left AICA patent. Basilar widely patent. Superior cerebellar and posterior cerebral arteries patent. Fetal origin left posterior cerebral artery. Negative for aneurysm. MRA NECK FINDINGS  Normal aortic arch. Innominate artery widely patent. Left subclavian artery widely patent Moderate stenosis at the origin of the left common carotid artery. Left common carotid artery is a small vessel with slow flow. This vessel appears to occlude near the carotid bifurcation however there could be a small branch patent. Left internal carotid artery may be occluded or have slow flow. This is difficult to determine due to enhancing collateral vessels in the neck. Both vertebral arteries are patent without stenosis in the neck. IMPRESSION: 1. Negative for acute infarct. Mild chronic microvascular ischemic change in the white matter. 2. 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 Possible occlusion of the distal left common carotid artery. Possible occlusion versus minimal flow left internal carotid artery. The MRA head suggests there is a small amount of flow in the left internal carotid artery through the skull base. This is similar to the prior CT angiogram 11/18/2020. Electronically Signed   By: Franchot Gallo M.D.   On: 05/30/2021 19:48   MR Angiogram Neck W or Wo Contrast  Result Date: 05/30/2021 CLINICAL DATA:  Diplopia.  Rule out stroke EXAM: MRI HEAD WITHOUT AND WITH CONTRAST MRA HEAD WITHOUT CONTRAST MRA NECK WITHOUT AND WITH CONTRAST TECHNIQUE: Multiplanar, multiecho pulse sequences of the brain and surrounding structures were obtained without and with intravenous contrast. Angiographic images of the Circle of Willis were obtained using MRA technique without intravenous contrast. Angiographic images of the neck were obtained using MRA technique without and with intravenous contrast. Carotid stenosis measurements (when applicable) are obtained utilizing NASCET criteria, using the distal internal carotid diameter as the denominator. CONTRAST:  23mL GADAVIST GADOBUTROL 1 MMOL/ML IV SOLN COMPARISON:  CT head 05/30/2021 FINDINGS: MRI HEAD FINDINGS Brain: Negative for acute infarct. Mild  white matter changes consistent with chronic microvascular ischemia. Brainstem and cerebellum intact. Negative for hemorrhage, mass, or fluid collection. Ventricle size and cerebral volume normal for age. Normal enhancement of the brain postcontrast administration. Vascular: Loss of flow void left internal carotid artery through the skull base and cavernous segment compatible with occlusion. Otherwise normal arterial flow voids. Skull and upper cervical spine: No focal skeletal lesion. Sinuses/Orbits: Mild mucosal edema paranasal sinuses. Bilateral cataract extraction Other: None MRA HEAD FINDINGS Minimal flow left internal carotid artery. Small amount of flow related signal in the left internal carotid artery in the neck and cavernous carotid. This is similar to prior CTA of 11/18/2020. Right internal carotid artery widely patent. Anterior and middle cerebral arteries patent bilaterally without stenosis or large vessel occlusion. Negative for aneurysm. Internal carotid artery patent bilaterally. Right PICA patent. Left PICA not visualized. Left AICA patent. Basilar widely patent. Superior cerebellar and  posterior cerebral arteries patent. Fetal origin left posterior cerebral artery. Negative for aneurysm. MRA NECK FINDINGS Normal aortic arch. Innominate artery widely patent. Left subclavian artery widely patent Moderate stenosis at the origin of the left common carotid artery. Left common carotid artery is a small vessel with slow flow. This vessel appears to occlude near the carotid bifurcation however there could be a small branch patent. Left internal carotid artery may be occluded or have slow flow. This is difficult to determine due to enhancing collateral vessels in the neck. Both vertebral arteries are patent without stenosis in the neck. IMPRESSION: 1. Negative for acute infarct. Mild chronic microvascular ischemic change in the white matter. 2. 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  Possible occlusion of the distal left common carotid artery. Possible occlusion versus minimal flow left internal carotid artery. The MRA head suggests there is a small amount of flow in the left internal carotid artery through the skull base. This is similar to the prior CT angiogram 11/18/2020. Electronically Signed   By: Franchot Gallo M.D.   On: 05/30/2021 19:48   MR BRAIN W WO CONTRAST  Result Date: 05/30/2021 CLINICAL DATA:  Diplopia.  Rule out stroke EXAM: MRI HEAD WITHOUT AND WITH CONTRAST MRA HEAD WITHOUT CONTRAST MRA NECK WITHOUT AND WITH CONTRAST TECHNIQUE: Multiplanar, multiecho pulse sequences of the brain and surrounding structures were obtained without and with intravenous contrast. Angiographic images of the Circle of Willis were obtained using MRA technique without intravenous contrast. Angiographic images of the neck were obtained using MRA technique without and with intravenous contrast. Carotid stenosis measurements (when applicable) are obtained utilizing NASCET criteria, using the distal internal carotid diameter as the denominator. CONTRAST:  39mL GADAVIST GADOBUTROL 1 MMOL/ML IV SOLN COMPARISON:  CT head 05/30/2021 FINDINGS: MRI HEAD FINDINGS Brain: Negative for acute infarct. Mild white matter changes consistent with chronic microvascular ischemia. Brainstem and cerebellum intact. Negative for hemorrhage, mass, or fluid collection. Ventricle size and cerebral volume normal for age. Normal enhancement of the brain postcontrast administration. Vascular: Loss of flow void left internal carotid artery through the skull base and cavernous segment compatible with occlusion. Otherwise normal arterial flow voids. Skull and upper cervical spine: No focal skeletal lesion. Sinuses/Orbits: Mild mucosal edema paranasal sinuses. Bilateral cataract extraction Other: None MRA HEAD FINDINGS Minimal flow left internal carotid artery. Small amount of flow related signal in the left internal carotid artery  in the neck and cavernous carotid. This is similar to prior CTA of 11/18/2020. Right internal carotid artery widely patent. Anterior and middle cerebral arteries patent bilaterally without stenosis or large vessel occlusion. Negative for aneurysm. Internal carotid artery patent bilaterally. Right PICA patent. Left PICA not visualized. Left AICA patent. Basilar widely patent. Superior cerebellar and posterior cerebral arteries patent. Fetal origin left posterior cerebral artery. Negative for aneurysm. MRA NECK FINDINGS Normal aortic arch. Innominate artery widely patent. Left subclavian artery widely patent Moderate stenosis at the origin of the left common carotid artery. Left common carotid artery is a small vessel with slow flow. This vessel appears to occlude near the carotid bifurcation however there could be a small branch patent. Left internal carotid artery may be occluded or have slow flow. This is difficult to determine due to enhancing collateral vessels in the neck. Both vertebral arteries are patent without stenosis in the neck. IMPRESSION: 1. Negative for acute infarct. Mild chronic microvascular ischemic change in the white matter. 2. 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 Possible  occlusion of the distal left common carotid artery. Possible occlusion versus minimal flow left internal carotid artery. The MRA head suggests there is a small amount of flow in the left internal carotid artery through the skull base. This is similar to the prior CT angiogram 11/18/2020. Electronically Signed   By: Franchot Gallo M.D.   On: 05/30/2021 19:48      IMPRESSION AND PLAN:  Assessment and Plan: * Diplopia- (present on admission) - The patient will be placed in an observation telemetry bed for further assessment for possible TIA. - Brain MRI and MRA are negative for acute CVA however shows peripheral vascular disease with left common carotid artery slow flow and proximal stenosis and  possible occlusion of the distal left common carotid artery with possible occlusion versus minimal flow of the left internal carotid artery.  Changes are similar to the previous CTA on 11/18/2020. - We will add Plavix to his aspirin. - We will obtain a 2D echo with bubble study in a.m. for further assessment. - Neurology consult will be obtained. - The case was discussed with Dr. Theda Sers who evaluated the patient.  PVD (peripheral vascular disease) ([place])- (present on admission) - This is manifested by carotid stenosis as mentioned above. - It is currently stable compared to August/2022. - The patient will be placed on aspirin and Plavix as mentioned above   Essential hypertension- (present on admission) - We will continue Cozaar and amlodipine with permissive parameters.  Hyperlipidemia- (present on admission) - We will continue statin therapy and check fasting lipids.  Depression- (present on admission) - We will continue Zoloft.  COPD with acute exacerbation ([place]) - We will place him on as needed albuterol. - We will hold off long-acting beta agonist.  Allergic rhinitis- (present on admission) - We will continue Singulair.   DVT prophylaxis: Lovenox.  Advanced Care Planning:  Code Status: The patient is DNR/DNI.   Family Communication:  The plan of care was discussed in details with the patient (and family). I answered all questions. The patient agreed to proceed with the above mentioned plan. Further management will depend upon hospital course. Disposition Plan: Back to previous home environment Consults called: Neurology.   All the records are reviewed and case discussed with ED provider.  Status is: Observation  I certify that at the time of admission, it is my clinical judgment that the patient will require hospital care extending less than 2 midnights.                            Dispo: The patient is from: Home              Anticipated d/c is to: Home               Patient currently is not medically stable to d/c.              Difficult to place patient: No  Christel Mormon M.D on 05/30/2021 at 10:25 PM  [facility]   From 7 PM-7 AM, contact night-coverage [email or web address]  CC: Primary care physician; Pleas Koch, NP

## 2021-05-31 ENCOUNTER — Observation Stay (HOSPITAL_BASED_OUTPATIENT_CLINIC_OR_DEPARTMENT_OTHER): Payer: Medicare HMO

## 2021-05-31 ENCOUNTER — Other Ambulatory Visit (HOSPITAL_COMMUNITY): Payer: Self-pay

## 2021-05-31 ENCOUNTER — Telehealth: Payer: Self-pay

## 2021-05-31 ENCOUNTER — Encounter (HOSPITAL_COMMUNITY): Payer: Self-pay | Admitting: Family Medicine

## 2021-05-31 DIAGNOSIS — I63439 Cerebral infarction due to embolism of unspecified posterior cerebral artery: Secondary | ICD-10-CM | POA: Diagnosis not present

## 2021-05-31 DIAGNOSIS — I639 Cerebral infarction, unspecified: Secondary | ICD-10-CM | POA: Diagnosis not present

## 2021-05-31 DIAGNOSIS — G459 Transient cerebral ischemic attack, unspecified: Secondary | ICD-10-CM | POA: Diagnosis not present

## 2021-05-31 DIAGNOSIS — E785 Hyperlipidemia, unspecified: Secondary | ICD-10-CM | POA: Diagnosis not present

## 2021-05-31 DIAGNOSIS — I10 Essential (primary) hypertension: Secondary | ICD-10-CM | POA: Diagnosis not present

## 2021-05-31 DIAGNOSIS — I6522 Occlusion and stenosis of left carotid artery: Secondary | ICD-10-CM | POA: Diagnosis not present

## 2021-05-31 DIAGNOSIS — H532 Diplopia: Secondary | ICD-10-CM | POA: Diagnosis not present

## 2021-05-31 LAB — ECHOCARDIOGRAM COMPLETE BUBBLE STUDY
AR max vel: 2.73 cm2
AV Area VTI: 2.58 cm2
AV Area mean vel: 2.64 cm2
AV Mean grad: 5 mmHg
AV Peak grad: 10.5 mmHg
Ao pk vel: 1.62 m/s
Area-P 1/2: 2.69 cm2
S' Lateral: 3.5 cm

## 2021-05-31 LAB — HEMOGLOBIN A1C
Hgb A1c MFr Bld: 5.3 % (ref 4.8–5.6)
Mean Plasma Glucose: 105.41 mg/dL

## 2021-05-31 LAB — LIPID PANEL
Cholesterol: 142 mg/dL (ref 0–200)
HDL: 51 mg/dL (ref >40)
LDL Cholesterol: 75 mg/dL (ref 0–99)
Total CHOL/HDL Ratio: 2.8 RATIO
Triglycerides: 79 mg/dL (ref <150)
VLDL: 16 mg/dL (ref 0–40)

## 2021-05-31 LAB — LDL CHOLESTEROL, DIRECT: Direct LDL: 74.5 mg/dL (ref 0–99)

## 2021-05-31 LAB — RESP PANEL BY RT-PCR (FLU A&B, COVID) ARPGX2
Influenza A by PCR: NEGATIVE
Influenza B by PCR: NEGATIVE
SARS Coronavirus 2 by RT PCR: NEGATIVE

## 2021-05-31 MED ORDER — ASPIRIN 325 MG PO TBEC
325.0000 mg | DELAYED_RELEASE_TABLET | Freq: Every day | ORAL | 0 refills | Status: AC
  Filled 2021-05-31: qty 21, 21d supply, fill #0

## 2021-05-31 MED ORDER — ASPIRIN 325 MG PO TBEC
325.0000 mg | DELAYED_RELEASE_TABLET | Freq: Every day | ORAL | 3 refills | Status: DC
  Filled 2021-05-31: qty 100, 100d supply, fill #0

## 2021-05-31 MED ORDER — PANTOPRAZOLE SODIUM 40 MG PO TBEC
40.0000 mg | DELAYED_RELEASE_TABLET | Freq: Every day | ORAL | 2 refills | Status: DC
  Filled 2021-05-31: qty 30, 30d supply, fill #0

## 2021-05-31 MED ORDER — TICAGRELOR 90 MG PO TABS
90.0000 mg | ORAL_TABLET | Freq: Two times a day (BID) | ORAL | 3 refills | Status: DC
  Filled 2021-05-31: qty 60, 30d supply, fill #0

## 2021-05-31 NOTE — ED Notes (Signed)
Pt verbalized understanding of d/c instructions, meds and followup care. Denies questions. VSS, no distress noted. Steady gait to exit with all belongings.  ?

## 2021-05-31 NOTE — Evaluation (Signed)
Occupational Therapy Evaluation ?Patient Details ?Name: Alan [name] ?MRN: [id number] ?DOB: [birth year] ?Today's Date: 05/31/2021 ? ? ?History of Present Illness Pt is a 76 y/o male presenting on 2/28 with diplopia. Brain MRI and MRA negative for acute CVA. PMH includes: L carotid artery stenosis, COPD, OA, HTN, L TKA.  ? ?Clinical Impression ?  ?PTA patient independent and driving. Admitted for above and presents with diplopia- see visual assessment for further details.  Patient currently requires supervision for transfers, mobility and ADLs. Provided with partial occlusion glasses, taping nasal portion of R non dominant eye, and significantly improved distance vision into single image.  Educated on use of glasses, progression, safety and recommendations.  Pt will have support of significant other at dc.  Will follow acutely, recommend OP OT at dc.  ?   ? ?Recommendations for follow up therapy are one component of a multi-disciplinary discharge planning process, led by the attending physician.  Recommendations may be updated based on patient status, additional functional criteria and insurance authorization.  ? ?Follow Up Recommendations ? Outpatient OT  ?  ?Assistance Recommended at Discharge Intermittent Supervision/Assistance  ?Patient can return home with the following A little help with bathing/dressing/bathroom;Assistance with cooking/housework;Help with stairs or ramp for entrance;Direct supervision/assist for financial management;Direct supervision/assist for medications management;Assist for transportation ? ?  ?Functional Status Assessment ? Patient has had a recent decline in their functional status and demonstrates the ability to make significant improvements in function in a reasonable and predictable amount of time.  ?Equipment Recommendations ? None recommended by OT  ?  ?Recommendations for Other Services   ? ? ?  ?Precautions / Restrictions Precautions ?Precautions: Fall ?Precaution Comments:  diplopia ?Restrictions ?Weight Bearing Restrictions: No  ? ?  ? ?Mobility Bed Mobility ?  ?  ?  ?  ?  ?  ?  ?General bed mobility comments: EOB upon entry ?  ? ?Transfers ?  ?  ?  ?  ?  ?  ?  ?  ?  ?  ?  ? ?  ?Balance Overall balance assessment: Mild deficits observed, not formally tested ?  ?  ?  ?  ?  ?  ?  ?  ?  ?  ?  ?  ?  ?  ?  ?  ?  ?  ?   ? ?ADL either performed or assessed with clinical judgement  ? ?ADL Overall ADL's : Needs assistance/impaired ?  ?  ?Grooming: Supervision/safety;Standing ?  ?  ?  ?  ?  ?Upper Body Dressing : Set up;Sitting ?  ?Lower Body Dressing: Supervision/safety;Sit to/from stand ?  ?Toilet Transfer: Supervision/safety;Ambulation ?  ?  ?  ?  ?  ?Functional mobility during ADLs: Supervision/safety ?   ? ? ? ?Vision Baseline Vision/History: 1 Wears glasses (reading) ?Ability to See in Adequate Light: 0 Adequate ?Patient Visual Report: Diplopia ?Vision Assessment?: Yes ?Eye Alignment: Within Functional Limits ?Ocular Range of Motion: Within Functional Limits ?Alignment/Gaze Preference: Within Defined Limits ?Tracking/Visual Pursuits: Able to track stimulus in all quads without difficulty ?Visual Fields: No apparent deficits ?Diplopia Assessment: Disappears with one eye closed;Objects split on top of one another;Present in far gaze (present in all directions, pt reports not as bad to R vs L) ?Depth Perception: Undershoots ?Additional Comments: partial occlusion glasses improved distance diplopia with pt reports intermittent diplopia 'in one spot" on the L  ?   ?Perception   ?  ?Praxis   ?  ? ?Pertinent Vitals/Pain Pain Assessment ?Pain  Assessment: No/denies pain  ? ? ? ?Hand Dominance Left ?  ?Extremity/Trunk Assessment Upper Extremity Assessment ?Upper Extremity Assessment: Overall WFL for tasks assessed ?  ?Lower Extremity Assessment ?Lower Extremity Assessment: Defer to PT evaluation ?  ?Cervical / Trunk Assessment ?Cervical / Trunk Assessment: Normal ?  ?Communication  Communication ?Communication: No difficulties ?  ?Cognition Arousal/Alertness: Awake/alert ?Behavior During Therapy: [facility] for tasks assessed/performed ?Overall Cognitive Status: Within Functional Limits for tasks assessed ?  ?  ?  ?  ?  ?  ?  ?  ?  ?  ?  ?  ?  ?  ?  ?  ?  ?  ?  ?General Comments  educated on use of taping for diplopia, handout provided. ? ?  ?Exercises   ?  ?Shoulder Instructions    ? ? ?Home Living Family/patient expects to be discharged to:: Private residence ?Living Arrangements: Alone ?Available Help at Discharge: Other (Comment);Available 24 hours/day (SO) ?Type of Home: House ?Home Access: Stairs to enter ?Entrance Stairs-Number of Steps: 2 ?Entrance Stairs-Rails: None ?Home Layout: One level ?  ?  ?Bathroom Shower/Tub: Tub/shower unit ?  ?Bathroom Toilet: Standard ?  ?  ?Home Equipment: Conservation officer, nature (2 wheels);Cane - single point;Wheelchair - manual;BSC/3in1 ?  ?  ?  ? ?  ?Prior Functioning/Environment Prior Level of Function : Independent/Modified Independent;Driving ?  ?  ?  ?  ?  ?  ?  ?  ?  ? ?  ?  ?OT Problem List: Impaired balance (sitting and/or standing);Decreased knowledge of use of DME or AE;Impaired vision/perception ?  ?   ?OT Treatment/Interventions: Self-care/ADL training;Visual/perceptual remediation/compensation;Patient/family education;Balance training;DME and/or AE instruction  ?  ?OT Goals(Current goals can be found in the care plan section) Acute Rehab OT Goals ?Patient Stated Goal: improve my vision and get home ?OT Goal Formulation: With patient ?Time For Goal Achievement: 06/14/21 ?Potential to Achieve Goals: Good  ?OT Frequency: Min 2X/week ?  ? ?Co-evaluation   ?  ?  ?  ?  ? ?  ?AM-PAC OT "6 Clicks" Daily Activity     ?Outcome Measure Help from another person eating meals?: None ?Help from another person taking care of personal grooming?: A Little ?Help from another person toileting, which includes using toliet, bedpan, or urinal?: A Little ?Help from another person  bathing (including washing, rinsing, drying)?: A Little ?Help from another person to put on and taking off regular upper body clothing?: A Little ?Help from another person to put on and taking off regular lower body clothing?: A Little ?6 Click Score: 19 ?  ?End of Session Nurse Communication: Mobility status ? ?Activity Tolerance: Patient tolerated treatment well ?Patient left: with call bell/phone within reach;Other (comment) (seated edge of stretcher) ? ?OT Visit Diagnosis: Low vision, both eyes (H54.2)  ?              ?Time: 1478-2956 ?OT Time Calculation (min): 27 min ?Charges:  OT General Charges ?$OT Visit: 1 Visit ?OT Evaluation ?$OT Eval Moderate Complexity: 1 Mod ?OT Treatments ?$Neuromuscular Re-education: 8-22 mins ? ?Alan [name], OT ?Acute Rehabilitation Services ?Pager [phone] ?Office [phone] ? ? ?Alan [name] ?05/31/2021, 9:48 AM ?

## 2021-05-31 NOTE — TOC Benefit Eligibility Note (Signed)
Patient Advocate Encounter  Insurance verification completed.    The patient is currently admitted and upon discharge could be taking Brilinta 90 mg.  The current 30 day co-pay is, $45.00.   The patient is insured through [employer] Part D     Jamel Dunton, CPhT Pharmacy Patient Advocate Specialist [facility] Patient Advocate Team Direct Number: [phone]  Fax: [phone]        

## 2021-05-31 NOTE — Discharge Summary (Signed)
PATIENT DETAILS Name: Alan [name] Age: 76 y.o. Sex: male Date of Birth: 1945-12-01 MRN: [id number]. Admitting Physician: Christel Mormon, MD JKD:[id number], Leticia Penna, NP  Admit Date: 05/30/2021 Discharge date: 05/31/2021  Recommendations for Outpatient Follow-up:  Follow up with PCP in 1-2 weeks Please obtain CMP/CBC in one week Please ensure follow-up with vascular surgery, stroke clinic. Aspirin/Brilinta x3 weeks, followed by Brilinta alone.  Admitted From:  Home  Disposition: Home   Discharge Condition: good  CODE STATUS:   Code Status: DNR   Diet recommendation:  Diet Order             Diet Heart Room service appropriate? Yes; Fluid consistency: Thin  Diet effective now           Diet - low sodium heart healthy                    Brief Summary: Patient is a 76 year old male with known history of left carotid artery occlusion, HLD, HTN who presented with sudden onset of painless diplopia.  Patient was subsequently admitted to the hospitalist service-see below for further details.  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  Echo with preserved EF. Telemetry negative.  A1c 5.3, LDL 75.  Discussed with stroke MD-Dr. Reeves Forth on 3/1-recommendations of aspirin (325 mg daily) along with Brilinta (90 mg BID) x3 weeks-followed by Brilinta alone.  Patient was instructed to follow-up with his vascular surgeon-to see if vascular surgery is okay with Brilinta.  Patient apparently was supposed to be on Plavix in the past-but did not take further dosing due to concern for side effects.  Patient continued to have diplopia during this hospitalization-however he did not have any other focal deficits on exam.  Chronic left carotid artery occlusion: Chronic issue-has been evaluated by vascular surgery in the past.  Continue antiplatelet agent/statin on discharge.  Rest of his  medical problems were stable during the short overnight hospital stay.  Morbid obesity: Estimated body mass index is 35.01 kg/m as calculated from the following:   Height as of this encounter: 6' (1.829 m).   Weight as of this encounter: 117.1 kg.   Discharge Diagnoses:  Principal Problem:   Diplopia Active Problems:   Hyperlipidemia   Essential hypertension   Allergic rhinitis   COPD with acute exacerbation (HCC)   Depression   PVD (peripheral vascular disease) ([place])   Discharge Instructions:  Activity:  As tolerated with Full fall precautions use walker/cane & assistance as needed  Discharge Instructions     Ambulatory referral to Neurology   Complete by: As directed    An appointment is requested in approximately: 8 weeks   Diet - low sodium heart healthy   Complete by: As directed    Discharge instructions   Complete by: As directed    Follow with Primary MD  Pleas Koch, NP in 1-2 weeks  Please follow-up with vascular surgery  Please follow with the stroke clinic-they will give you a call  Take aspirin along with Brilinta for 3 weeks, after 3 weeks stop aspirin and continue on Brilinta.  Please get a complete blood count and chemistry panel checked by your Primary MD at your next visit, and again as instructed by your Primary MD.  Get Medicines reviewed and adjusted: Please take all your medications with you for your next visit with your Primary MD  Laboratory/radiological  data: Please request your Primary MD to go over all hospital tests and procedure/radiological results at the follow up, please ask your Primary MD to get all Hospital records sent to his/her office.  In some cases, they will be blood work, cultures and biopsy results pending at the time of your discharge. Please request that your primary care M.D. follows up on these results.  Also Note the following: If you experience worsening of your admission symptoms, develop shortness of  breath, life threatening emergency, suicidal or homicidal thoughts you must seek medical attention immediately by calling 911 or calling your MD immediately  if symptoms less severe.  You must read complete instructions/literature along with all the possible adverse reactions/side effects for all the Medicines you take and that have been prescribed to you. Take any new Medicines after you have completely understood and accpet all the possible adverse reactions/side effects.   Do not drive when taking Pain medications or sleeping medications (Benzodaizepines)  Do not take more than prescribed Pain, Sleep and Anxiety Medications. It is not advisable to combine anxiety,sleep and pain medications without talking with your primary care practitioner  Special Instructions: If you have smoked or chewed Tobacco  in the last 2 yrs please stop smoking, stop any regular Alcohol  and or any Recreational drug use.  Wear Seat belts while driving.  Please note: You were cared for by a hospitalist during your hospital stay. Once you are discharged, your primary care physician will handle any further medical issues. Please note that NO REFILLS for any discharge medications will be authorized once you are discharged, as it is imperative that you return to your primary care physician (or establish a relationship with a primary care physician if you do not have one) for your post hospital discharge needs so that they can reassess your need for medications and monitor your lab values.   Please do not drive or operate heavy machinery due to double vision-until you are cleared by outpatient ophthalmology or neurology.   Increase activity slowly   Complete by: As directed       Allergies as of 05/31/2021   No Known Allergies      Medication List     TAKE these medications    albuterol 108 (90 Base) MCG/ACT inhaler Commonly known as: VENTOLIN HFA Inhale 2 puffs into the lungs every 4 (four) hours as needed for  wheezing or shortness of breath (cough, shortness of breath or wheezing.).   amLODipine 10 MG tablet Commonly known as: NORVASC TAKE 1 TABLET BY MOUTH EVERY DAY FOR BLOOD PRESSURE What changed: See the new instructions.   aspirin 325 MG EC tablet Take 1 tablet (325 mg total) by mouth daily for 21 days. What changed:  medication strength how much to take additional instructions   atorvastatin 80 MG tablet Commonly known as: LIPITOR TAKE 1 TABLET BY MOUTH EVERY DAY FOR CHOLESTEROL What changed: See the new instructions.   cetirizine 10 MG tablet Commonly known as: ZYRTEC TAKE 1 TABLET BY MOUTH DAILY AS NEEDED FOR ALLERGIES   fish oil-omega-3 fatty acids 1000 MG capsule Take 2 g by mouth daily.   losartan 100 MG tablet Commonly known as: COZAAR TAKE 1 TABLET BY MOUTH EVERY DAY FOR BLOOD PRESSURE What changed: See the new instructions.   montelukast 10 MG tablet Commonly known as: SINGULAIR TAKE 1 TABLET (10 MG TOTAL) BY MOUTH AT BEDTIME. FOR ALLERGIES   multivitamin tablet Take 1 tablet by mouth daily.   pantoprazole  40 MG tablet Commonly known as: Protonix Take 1 tablet (40 mg total) by mouth daily.   sertraline 50 MG tablet Commonly known as: ZOLOFT TAKE 1 TABLET BY MOUTH EVERY DAY FOR DEPRESSION What changed: See the new instructions.   Symbicort 160-4.5 MCG/ACT inhaler Generic drug: budesonide-formoterol INHALE 2 PUFFS INTO THE LUNGS TWICE A DAY   ticagrelor 90 MG Tabs tablet Commonly known as: Brilinta Take 1 tablet (90 mg total) by mouth 2 (two) times daily.        Follow-up Information     Pleas Koch, NP. Schedule an appointment as soon as possible for a visit in 1 week(s).   Specialty: Internal Medicine Contact information: [place] [address] [phone]         Garvin Fila, MD Follow up.   Specialties: Neurology, Radiology Why: Office will call with date/time, If you dont hear from them,please give them a  call Contact information: [place] [address] [place], [name], MD. Schedule an appointment as soon as possible for a visit in 1 week(s).   Specialties: Vascular Surgery, Cardiology, Radiology, Vascular Surgery Contact information: [place] [address] [phone]                No Known Allergies   Other Procedures/Studies: CT Head Wo Contrast  Result Date: 05/30/2021 CLINICAL DATA:  Diplopia starting at 8:30 today.  Recent headache. EXAM: CT HEAD WITHOUT CONTRAST TECHNIQUE: Contiguous axial images were obtained from the base of the skull through the vertex without intravenous contrast. RADIATION DOSE REDUCTION: This exam was performed according to the departmental dose-optimization program which includes automated exposure control, adjustment of the mA and/or kV according to patient size and/or use of iterative reconstruction technique. COMPARISON:  11/18/2020 and MRI brain from 11/11/2020 FINDINGS: Brain: Chronic mild encephalomalacia along the medial anterior frontal lobes bilaterally, unchanged. Otherwise, the brainstem, cerebellum, cerebral peduncles, thalamus, basal ganglia, basilar cisterns, and ventricular system appear within normal limits. No intracranial hemorrhage, mass lesion, or acute CVA. Vascular: There is atherosclerotic calcification of the cavernous carotid arteries bilaterally. Skull: Unremarkable Sinuses/Orbits: Unremarkable Other: Unremarkable IMPRESSION: 1. No acute intracranial findings. 2. 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 3. Atherosclerosis. Electronically Signed   By: Van Clines M.D.   On: 05/30/2021 17:32   MR ANGIO HEAD WO CONTRAST  Result Date: 05/30/2021 CLINICAL DATA:  Diplopia.  Rule out stroke EXAM: MRI HEAD WITHOUT AND WITH CONTRAST MRA HEAD WITHOUT CONTRAST MRA NECK WITHOUT AND WITH CONTRAST TECHNIQUE:  Multiplanar, multiecho pulse sequences of the brain and surrounding structures were obtained without and with intravenous contrast. Angiographic images of the Circle of Willis were obtained using MRA technique without intravenous contrast. Angiographic images of the neck were obtained using MRA technique without and with intravenous contrast. Carotid stenosis measurements (when applicable) are obtained utilizing NASCET criteria, using the distal internal carotid diameter as the denominator. CONTRAST:  75mL GADAVIST GADOBUTROL 1 MMOL/ML IV SOLN COMPARISON:  CT head 05/30/2021 FINDINGS: MRI HEAD FINDINGS Brain: Negative for acute infarct. Mild white matter changes consistent with chronic microvascular ischemia. Brainstem and cerebellum intact. Negative for hemorrhage, mass, or fluid collection. Ventricle size and cerebral volume normal for age. Normal enhancement of the brain postcontrast administration. Vascular: Loss of flow void left internal carotid artery through the skull base and cavernous segment compatible with occlusion. Otherwise normal arterial flow voids.  Skull and upper cervical spine: No focal skeletal lesion. Sinuses/Orbits: Mild mucosal edema paranasal sinuses. Bilateral cataract extraction Other: None MRA HEAD FINDINGS Minimal flow left internal carotid artery. Small amount of flow related signal in the left internal carotid artery in the neck and cavernous carotid. This is similar to prior CTA of 11/18/2020. Right internal carotid artery widely patent. Anterior and middle cerebral arteries patent bilaterally without stenosis or large vessel occlusion. Negative for aneurysm. Internal carotid artery patent bilaterally. Right PICA patent. Left PICA not visualized. Left AICA patent. Basilar widely patent. Superior cerebellar and posterior cerebral arteries patent. Fetal origin left posterior cerebral artery. Negative for aneurysm. MRA NECK FINDINGS Normal aortic arch. Innominate artery widely patent.  Left subclavian artery widely patent Moderate stenosis at the origin of the left common carotid artery. Left common carotid artery is a small vessel with slow flow. This vessel appears to occlude near the carotid bifurcation however there could be a small branch patent. Left internal carotid artery may be occluded or have slow flow. This is difficult to determine due to enhancing collateral vessels in the neck. Both vertebral arteries are patent without stenosis in the neck. IMPRESSION: 1. Negative for acute infarct. Mild chronic microvascular ischemic change in the white matter. 2. 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 Possible occlusion of the distal left common carotid artery. Possible occlusion versus minimal flow left internal carotid artery. The MRA head suggests there is a small amount of flow in the left internal carotid artery through the skull base. This is similar to the prior CT angiogram 11/18/2020. Electronically Signed   By: Franchot Gallo M.D.   On: 05/30/2021 19:48   MR Angiogram Neck W or Wo Contrast  Result Date: 05/30/2021 CLINICAL DATA:  Diplopia.  Rule out stroke EXAM: MRI HEAD WITHOUT AND WITH CONTRAST MRA HEAD WITHOUT CONTRAST MRA NECK WITHOUT AND WITH CONTRAST TECHNIQUE: Multiplanar, multiecho pulse sequences of the brain and surrounding structures were obtained without and with intravenous contrast. Angiographic images of the Circle of Willis were obtained using MRA technique without intravenous contrast. Angiographic images of the neck were obtained using MRA technique without and with intravenous contrast. Carotid stenosis measurements (when applicable) are obtained utilizing NASCET criteria, using the distal internal carotid diameter as the denominator. CONTRAST:  33mL GADAVIST GADOBUTROL 1 MMOL/ML IV SOLN COMPARISON:  CT head 05/30/2021 FINDINGS: MRI HEAD FINDINGS Brain: Negative for acute infarct. Mild white matter changes consistent with chronic  microvascular ischemia. Brainstem and cerebellum intact. Negative for hemorrhage, mass, or fluid collection. Ventricle size and cerebral volume normal for age. Normal enhancement of the brain postcontrast administration. Vascular: Loss of flow void left internal carotid artery through the skull base and cavernous segment compatible with occlusion. Otherwise normal arterial flow voids. Skull and upper cervical spine: No focal skeletal lesion. Sinuses/Orbits: Mild mucosal edema paranasal sinuses. Bilateral cataract extraction Other: None MRA HEAD FINDINGS Minimal flow left internal carotid artery. Small amount of flow related signal in the left internal carotid artery in the neck and cavernous carotid. This is similar to prior CTA of 11/18/2020. Right internal carotid artery widely patent. Anterior and middle cerebral arteries patent bilaterally without stenosis or large vessel occlusion. Negative for aneurysm. Internal carotid artery patent bilaterally. Right PICA patent. Left PICA not visualized. Left AICA patent. Basilar widely patent. Superior cerebellar and posterior cerebral arteries patent. Fetal origin left posterior cerebral artery. Negative for aneurysm. MRA NECK FINDINGS Normal aortic arch. Innominate artery widely patent. Left subclavian artery widely  patent Moderate stenosis at the origin of the left common carotid artery. Left common carotid artery is a small vessel with slow flow. This vessel appears to occlude near the carotid bifurcation however there could be a small branch patent. Left internal carotid artery may be occluded or have slow flow. This is difficult to determine due to enhancing collateral vessels in the neck. Both vertebral arteries are patent without stenosis in the neck. IMPRESSION: 1. Negative for acute infarct. Mild chronic microvascular ischemic change in the white matter. 2. 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 Possible occlusion of the distal left common  carotid artery. Possible occlusion versus minimal flow left internal carotid artery. The MRA head suggests there is a small amount of flow in the left internal carotid artery through the skull base. This is similar to the prior CT angiogram 11/18/2020. Electronically Signed   By: Franchot Gallo M.D.   On: 05/30/2021 19:48   MR BRAIN W WO CONTRAST  Result Date: 05/30/2021 CLINICAL DATA:  Diplopia.  Rule out stroke EXAM: MRI HEAD WITHOUT AND WITH CONTRAST MRA HEAD WITHOUT CONTRAST MRA NECK WITHOUT AND WITH CONTRAST TECHNIQUE: Multiplanar, multiecho pulse sequences of the brain and surrounding structures were obtained without and with intravenous contrast. Angiographic images of the Circle of Willis were obtained using MRA technique without intravenous contrast. Angiographic images of the neck were obtained using MRA technique without and with intravenous contrast. Carotid stenosis measurements (when applicable) are obtained utilizing NASCET criteria, using the distal internal carotid diameter as the denominator. CONTRAST:  38mL GADAVIST GADOBUTROL 1 MMOL/ML IV SOLN COMPARISON:  CT head 05/30/2021 FINDINGS: MRI HEAD FINDINGS Brain: Negative for acute infarct. Mild white matter changes consistent with chronic microvascular ischemia. Brainstem and cerebellum intact. Negative for hemorrhage, mass, or fluid collection. Ventricle size and cerebral volume normal for age. Normal enhancement of the brain postcontrast administration. Vascular: Loss of flow void left internal carotid artery through the skull base and cavernous segment compatible with occlusion. Otherwise normal arterial flow voids. Skull and upper cervical spine: No focal skeletal lesion. Sinuses/Orbits: Mild mucosal edema paranasal sinuses. Bilateral cataract extraction Other: None MRA HEAD FINDINGS Minimal flow left internal carotid artery. Small amount of flow related signal in the left internal carotid artery in the neck and cavernous carotid. This is  similar to prior CTA of 11/18/2020. Right internal carotid artery widely patent. Anterior and middle cerebral arteries patent bilaterally without stenosis or large vessel occlusion. Negative for aneurysm. Internal carotid artery patent bilaterally. Right PICA patent. Left PICA not visualized. Left AICA patent. Basilar widely patent. Superior cerebellar and posterior cerebral arteries patent. Fetal origin left posterior cerebral artery. Negative for aneurysm. MRA NECK FINDINGS Normal aortic arch. Innominate artery widely patent. Left subclavian artery widely patent Moderate stenosis at the origin of the left common carotid artery. Left common carotid artery is a small vessel with slow flow. This vessel appears to occlude near the carotid bifurcation however there could be a small branch patent. Left internal carotid artery may be occluded or have slow flow. This is difficult to determine due to enhancing collateral vessels in the neck. Both vertebral arteries are patent without stenosis in the neck. IMPRESSION: 1. Negative for acute infarct. Mild chronic microvascular ischemic change in the white matter. 2. 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 Possible occlusion of the distal left common carotid artery. Possible occlusion versus minimal flow left internal carotid artery. The MRA head suggests there is a small amount of  flow in the left internal carotid artery through the skull base. This is similar to the prior CT angiogram 11/18/2020. Electronically Signed   By: Franchot Gallo M.D.   On: 05/30/2021 19:48   ECHOCARDIOGRAM COMPLETE BUBBLE STUDY  Result Date: 05/31/2021    ECHOCARDIOGRAM REPORT   Patient Name:   Alan [name] Date of Exam: 05/31/2021 Medical Rec #:  [id number]         Height:       72.0 in Accession #:    [id number]        Weight:       258.2 lb Date of Birth:  [birth year]          BSA:          2.375 m Patient Age:    37 years          BP:           130/76 mmHg Patient  Gender: M                 HR:           62 bpm. Exam Location:  Inpatient Procedure: 2D Echo and Saline Contrast Bubble Study Indications:    TIA  History:        Patient has no prior history of Echocardiogram examinations.                 Risk Factors:Hypertension and Dyslipidemia.  Sonographer:    Arlyss Gandy Referring Phys: [id number] JAN A Carl  1. Left ventricular ejection fraction, by estimation, is 60 to 65%. The left ventricle has normal function. The left ventricle has no regional wall motion abnormalities. Left ventricular diastolic parameters were normal.  2. Right ventricular systolic function is normal. The right ventricular size is normal. There is normal pulmonary artery systolic pressure.  3. The mitral valve is normal in structure. No evidence of mitral valve regurgitation.  4. The aortic valve is normal in structure. Aortic valve regurgitation is not visualized.  5. Agitated saline contrast bubble study was negative, with no evidence of any interatrial shunt. FINDINGS  Left Ventricle: Left ventricular ejection fraction, by estimation, is 60 to 65%. The left ventricle has normal function. The left ventricle has no regional wall motion abnormalities. The left ventricular internal cavity size was normal in size. There is  no left ventricular hypertrophy. Left ventricular diastolic parameters were normal. Right Ventricle: The right ventricular size is normal. Right vetricular wall thickness was not well visualized. Right ventricular systolic function is normal. There is normal pulmonary artery systolic pressure. The tricuspid regurgitant velocity is 2.12 m/s, and with an assumed right atrial pressure of 8 mmHg, the estimated right ventricular systolic pressure is 30.8 mmHg. Left Atrium: Left atrial size was normal in size. Right Atrium: Right atrial size was normal in size. Pericardium: There is no evidence of pericardial effusion. Mitral Valve: The mitral valve is normal in structure. No  evidence of mitral valve regurgitation. Tricuspid Valve: The tricuspid valve is grossly normal. Tricuspid valve regurgitation is not demonstrated. Aortic Valve: The aortic valve is normal in structure. Aortic valve regurgitation is not visualized. Aortic valve mean gradient measures 5.0 mmHg. Aortic valve peak gradient measures 10.5 mmHg. Aortic valve area, by VTI measures 2.58 cm. Pulmonic Valve: The pulmonic valve was normal in structure. Pulmonic valve regurgitation is not visualized. Aorta: The aortic root and ascending aorta are structurally normal, with no evidence of dilitation. IAS/Shunts: No atrial level shunt detected by color  flow Doppler. Agitated saline contrast was given intravenously to evaluate for intracardiac shunting. Agitated saline contrast bubble study was negative, with no evidence of any interatrial shunt.  LEFT VENTRICLE PLAX 2D LVIDd:         5.20 cm   Diastology LVIDs:         3.50 cm   LV e' medial:    8.38 cm/s LV PW:         0.90 cm   LV E/e' medial:  10.0 LV IVS:        0.90 cm   LV e' lateral:   8.81 cm/s LVOT diam:     2.00 cm   LV E/e' lateral: 9.5 LV SV:         94 LV SV Index:   40 LVOT Area:     3.14 cm  RIGHT VENTRICLE             IVC RV Basal diam:  4.50 cm     IVC diam: 1.90 cm RV Mid diam:    4.10 cm RV S prime:     11.30 cm/s TAPSE (M-mode): 2.8 cm LEFT ATRIUM             Index        RIGHT ATRIUM           Index LA diam:        3.80 cm 1.60 cm/m   RA Area:     17.60 cm LA Vol (A2C):   70.7 ml 29.77 ml/m  RA Volume:   49.60 ml  20.89 ml/m LA Vol (A4C):   46.0 ml 19.37 ml/m LA Biplane Vol: 58.7 ml 24.72 ml/m  AORTIC VALVE AV Area (Vmax):    2.73 cm AV Area (Vmean):   2.64 cm AV Area (VTI):     2.58 cm AV Vmax:           162.00 cm/s AV Vmean:          106.000 cm/s AV VTI:            0.365 m AV Peak Grad:      10.5 mmHg AV Mean Grad:      5.0 mmHg LVOT Vmax:         141.00 cm/s LVOT Vmean:        89.100 cm/s LVOT VTI:          0.300 m LVOT/AV VTI ratio: 0.82  AORTA  Ao Root diam: 3.30 cm Ao Asc diam:  3.00 cm MITRAL VALVE                TRICUSPID VALVE MV Area (PHT): 2.69 cm     TR Peak grad:   18.0 mmHg MV Decel Time: 282 msec     TR Vmax:        212.00 cm/s MV E velocity: 83.40 cm/s MV A velocity: 114.00 cm/s  SHUNTS MV E/A ratio:  0.73         Systemic VTI:  0.30 m                             Systemic Diam: 2.00 cm Mertie Moores MD Electronically signed by Mertie Moores MD Signature Date/Time: 05/31/2021/2:21:49 PM    Final      TODAY-DAY OF DISCHARGE:  Subjective:   Laurice Record today has no headache,no chest abdominal pain,no new weakness tingling or numbness, feels much better wants to go home today.  Objective:   Blood pressure (!) 142/70, pulse 61, temperature 98.5 F (36.9 C), temperature source Oral, resp. rate 18, height 6' (1.829 m), weight 117.1 kg, SpO2 94 %.  Intake/Output Summary (Last 24 hours) at 05/31/2021 1604 Last data filed at 05/31/2021 1500 Gross per 24 hour  Intake 1000 ml  Output --  Net 1000 ml   Filed Weights   05/30/21 1640  Weight: 117.1 kg    Exam: Awake Alert, Oriented *3, No new F.N deficits, Normal affect [place].AT,PERRAL Supple Neck,No JVD, No cervical lymphadenopathy appriciated.  Symmetrical Chest wall movement, Good air movement bilaterally, CTAB RRR,No Gallops,Rubs or new Murmurs, No Parasternal Heave +ve B.Sounds, Abd Soft, Non tender, No organomegaly appriciated, No rebound -guarding or rigidity. No Cyanosis, Clubbing or edema, No new Rash or bruise   PERTINENT RADIOLOGIC STUDIES: CT Head Wo Contrast  Result Date: 05/30/2021 CLINICAL DATA:  Diplopia starting at 8:30 today.  Recent headache. EXAM: CT HEAD WITHOUT CONTRAST TECHNIQUE: Contiguous axial images were obtained from the base of the skull through the vertex without intravenous contrast. RADIATION DOSE REDUCTION: This exam was performed according to the departmental dose-optimization program which includes automated exposure control, adjustment of  the mA and/or kV according to patient size and/or use of iterative reconstruction technique. COMPARISON:  11/18/2020 and MRI brain from 11/11/2020 FINDINGS: Brain: Chronic mild encephalomalacia along the medial anterior frontal lobes bilaterally, unchanged. Otherwise, the brainstem, cerebellum, cerebral peduncles, thalamus, basal ganglia, basilar cisterns, and ventricular system appear within normal limits. No intracranial hemorrhage, mass lesion, or acute CVA. Vascular: There is atherosclerotic calcification of the cavernous carotid arteries bilaterally. Skull: Unremarkable Sinuses/Orbits: Unremarkable Other: Unremarkable IMPRESSION: 1. No acute intracranial findings. 2. 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 3. Atherosclerosis. Electronically Signed   By: Van Clines M.D.   On: 05/30/2021 17:32   MR ANGIO HEAD WO CONTRAST  Result Date: 05/30/2021 CLINICAL DATA:  Diplopia.  Rule out stroke EXAM: MRI HEAD WITHOUT AND WITH CONTRAST MRA HEAD WITHOUT CONTRAST MRA NECK WITHOUT AND WITH CONTRAST TECHNIQUE: Multiplanar, multiecho pulse sequences of the brain and surrounding structures were obtained without and with intravenous contrast. Angiographic images of the Circle of Willis were obtained using MRA technique without intravenous contrast. Angiographic images of the neck were obtained using MRA technique without and with intravenous contrast. Carotid stenosis measurements (when applicable) are obtained utilizing NASCET criteria, using the distal internal carotid diameter as the denominator. CONTRAST:  61mL GADAVIST GADOBUTROL 1 MMOL/ML IV SOLN COMPARISON:  CT head 05/30/2021 FINDINGS: MRI HEAD FINDINGS Brain: Negative for acute infarct. Mild white matter changes consistent with chronic microvascular ischemia. Brainstem and cerebellum intact. Negative for hemorrhage, mass, or fluid collection. Ventricle size and cerebral volume normal for age. Normal  enhancement of the brain postcontrast administration. Vascular: Loss of flow void left internal carotid artery through the skull base and cavernous segment compatible with occlusion. Otherwise normal arterial flow voids. Skull and upper cervical spine: No focal skeletal lesion. Sinuses/Orbits: Mild mucosal edema paranasal sinuses. Bilateral cataract extraction Other: None MRA HEAD FINDINGS Minimal flow left internal carotid artery. Small amount of flow related signal in the left internal carotid artery in the neck and cavernous carotid. This is similar to prior CTA of 11/18/2020. Right internal carotid artery widely patent. Anterior and middle cerebral arteries patent bilaterally without stenosis or large vessel occlusion. Negative for aneurysm. Internal carotid artery patent bilaterally. Right PICA patent. Left PICA not visualized. Left AICA patent. Basilar widely patent. Superior cerebellar and  posterior cerebral arteries patent. Fetal origin left posterior cerebral artery. Negative for aneurysm. MRA NECK FINDINGS Normal aortic arch. Innominate artery widely patent. Left subclavian artery widely patent Moderate stenosis at the origin of the left common carotid artery. Left common carotid artery is a small vessel with slow flow. This vessel appears to occlude near the carotid bifurcation however there could be a small branch patent. Left internal carotid artery may be occluded or have slow flow. This is difficult to determine due to enhancing collateral vessels in the neck. Both vertebral arteries are patent without stenosis in the neck. IMPRESSION: 1. Negative for acute infarct. Mild chronic microvascular ischemic change in the white matter. 2. 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 Possible occlusion of the distal left common carotid artery. Possible occlusion versus minimal flow left internal carotid artery. The MRA head suggests there is a small amount of flow in the left internal carotid  artery through the skull base. This is similar to the prior CT angiogram 11/18/2020. Electronically Signed   By: Franchot Gallo M.D.   On: 05/30/2021 19:48   MR Angiogram Neck W or Wo Contrast  Result Date: 05/30/2021 CLINICAL DATA:  Diplopia.  Rule out stroke EXAM: MRI HEAD WITHOUT AND WITH CONTRAST MRA HEAD WITHOUT CONTRAST MRA NECK WITHOUT AND WITH CONTRAST TECHNIQUE: Multiplanar, multiecho pulse sequences of the brain and surrounding structures were obtained without and with intravenous contrast. Angiographic images of the Circle of Willis were obtained using MRA technique without intravenous contrast. Angiographic images of the neck were obtained using MRA technique without and with intravenous contrast. Carotid stenosis measurements (when applicable) are obtained utilizing NASCET criteria, using the distal internal carotid diameter as the denominator. CONTRAST:  41mL GADAVIST GADOBUTROL 1 MMOL/ML IV SOLN COMPARISON:  CT head 05/30/2021 FINDINGS: MRI HEAD FINDINGS Brain: Negative for acute infarct. Mild white matter changes consistent with chronic microvascular ischemia. Brainstem and cerebellum intact. Negative for hemorrhage, mass, or fluid collection. Ventricle size and cerebral volume normal for age. Normal enhancement of the brain postcontrast administration. Vascular: Loss of flow void left internal carotid artery through the skull base and cavernous segment compatible with occlusion. Otherwise normal arterial flow voids. Skull and upper cervical spine: No focal skeletal lesion. Sinuses/Orbits: Mild mucosal edema paranasal sinuses. Bilateral cataract extraction Other: None MRA HEAD FINDINGS Minimal flow left internal carotid artery. Small amount of flow related signal in the left internal carotid artery in the neck and cavernous carotid. This is similar to prior CTA of 11/18/2020. Right internal carotid artery widely patent. Anterior and middle cerebral arteries patent bilaterally without stenosis or  large vessel occlusion. Negative for aneurysm. Internal carotid artery patent bilaterally. Right PICA patent. Left PICA not visualized. Left AICA patent. Basilar widely patent. Superior cerebellar and posterior cerebral arteries patent. Fetal origin left posterior cerebral artery. Negative for aneurysm. MRA NECK FINDINGS Normal aortic arch. Innominate artery widely patent. Left subclavian artery widely patent Moderate stenosis at the origin of the left common carotid artery. Left common carotid artery is a small vessel with slow flow. This vessel appears to occlude near the carotid bifurcation however there could be a small branch patent. Left internal carotid artery may be occluded or have slow flow. This is difficult to determine due to enhancing collateral vessels in the neck. Both vertebral arteries are patent without stenosis in the neck. IMPRESSION: 1. Negative for acute infarct. Mild chronic microvascular ischemic change in the white matter. 2. Slow flow left common carotid artery with proximal  stenosis. Possible occlusion of the distal left common carotid artery. Possible occlusion versus minimal flow left internal carotid artery. The MRA head suggests there is a small amount of flow in the left internal carotid artery through the skull base. This is similar to the prior CT angiogram 11/18/2020. Electronically Signed   By: Franchot Gallo M.D.   On: 05/30/2021 19:48   MR BRAIN W WO CONTRAST  Result Date: 05/30/2021 CLINICAL DATA:  [place]iplopia.  Rule out stroke EXAM: MRI HEAD WITHOUT AND WITH CONTRAST MRA HEAD WITHOUT CONTRAST MRA NECK WITHOUT AND WITH CONTRAST TECHNIQUE: Multiplanar, multiecho pulse sequences of the brain and surrounding structures were obtained without and with intravenous contrast. Angiographic images of the Circle of Willis were obtained using MRA technique without intravenous contrast. Angiographic images of the neck were obtained using MRA technique without and with intravenous  contrast. Carotid stenosis measurements (when applicable) are obtained utilizing NASCET criteria, using the distal internal carotid diameter as the denominator. CONTRAST:  63mL GADAVIST GADOBUTROL 1 MMOL/ML IV SOLN COMPARISON:  CT head 05/30/2021 FINDINGS: MRI HEAD FINDINGS Brain: Negative for acute infarct. Mild white matter changes consistent with chronic microvascular ischemia. Brainstem and cerebellum intact. Negative for hemorrhage, mass, or fluid collection. Ventricle size and cerebral volume normal for age. Normal enhancement of the brain postcontrast administration. Vascular: Loss of flow void left internal carotid artery through the skull base and cavernous segment compatible with occlusion. Otherwise normal arterial flow voids. Skull and upper cervical spine: No focal skeletal lesion. Sinuses/Orbits: Mild mucosal edema paranasal sinuses. Bilateral cataract extraction Other: None MRA HEAD FINDINGS Minimal flow left internal carotid artery. Small amount of flow related signal in the left internal carotid artery in the neck and cavernous carotid. This is similar to prior CTA of 11/18/2020. Right internal carotid artery widely patent. Anterior and middle cerebral arteries patent bilaterally without stenosis or large vessel occlusion. Negative for aneurysm. Internal carotid artery patent bilaterally. Right PICA patent. Left PICA not visualized. Left AICA patent. Basilar widely patent. Superior cerebellar and posterior cerebral arteries patent. Fetal origin left posterior cerebral artery. Negative for aneurysm. MRA NECK FINDINGS Normal aortic arch. Innominate artery widely patent. Left subclavian artery widely patent Moderate stenosis at the origin of the left common carotid artery. Left common carotid artery is a small vessel with slow flow. This vessel appears to occlude near the carotid bifurcation however there could be a small branch patent. Left internal carotid artery may be occluded or have slow flow.  This is difficult to determine due to enhancing collateral vessels in the neck. Both vertebral arteries are patent without stenosis in the neck. IMPRESSION: 1. Negative for acute infarct. Mild chronic microvascular ischemic change in the white matter. 2. 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 Possible occlusion of the distal left common carotid artery. Possible occlusion versus minimal flow left internal carotid artery. The MRA head suggests there is a small amount of flow in the left internal carotid artery through the skull base. This is similar to the prior CT angiogram 11/18/2020. Electronically Signed   By: Franchot Gallo M.D.   On: 05/30/2021 19:48   ECHOCARDIOGRAM COMPLETE BUBBLE STUDY  Result Date: 05/31/2021    ECHOCARDIOGRAM REPORT   Patient Name:   Alan [name] Date of Exam: 05/31/2021 Medical Rec #:  [id number]         Height:       72.0 in Accession #:    [id number]        Weight:  258.2 lb Date of Birth:  04/02/1946          BSA:          2.375 m Patient Age:    49 years          BP:           130/76 mmHg Patient Gender: M                 HR:           62 bpm. Exam Location:  Inpatient Procedure: 2D Echo and Saline Contrast Bubble Study Indications:    TIA  History:        Patient has no prior history of Echocardiogram examinations.                 Risk Factors:Hypertension and Dyslipidemia.  Sonographer:    Arlyss Gandy Referring Phys: [id number] JAN A [place]  1. Left ventricular ejection fraction, by estimation, is 60 to 65%. The left ventricle has normal function. The left ventricle has no regional wall motion abnormalities. Left ventricular diastolic parameters were normal.  2. Right ventricular systolic function is normal. The right ventricular size is normal. There is normal pulmonary artery systolic pressure.  3. The mitral valve is normal in structure. No evidence of mitral valve regurgitation.  4. The aortic valve is normal in structure. Aortic valve  regurgitation is not visualized.  5. Agitated saline contrast bubble study was negative, with no evidence of any interatrial shunt. FINDINGS  Left Ventricle: Left ventricular ejection fraction, by estimation, is 60 to 65%. The left ventricle has normal function. The left ventricle has no regional wall motion abnormalities. The left ventricular internal cavity size was normal in size. There is  no left ventricular hypertrophy. Left ventricular diastolic parameters were normal. Right Ventricle: The right ventricular size is normal. Right vetricular wall thickness was not well visualized. Right ventricular systolic function is normal. There is normal pulmonary artery systolic pressure. The tricuspid regurgitant velocity is 2.12 m/s, and with an assumed right atrial pressure of 8 mmHg, the estimated right ventricular systolic pressure is 93.2 mmHg. Left Atrium: Left atrial size was normal in size. Right Atrium: Right atrial size was normal in size. Pericardium: There is no evidence of pericardial effusion. Mitral Valve: The mitral valve is normal in structure. No evidence of mitral valve regurgitation. Tricuspid Valve: The tricuspid valve is grossly normal. Tricuspid valve regurgitation is not demonstrated. Aortic Valve: The aortic valve is normal in structure. Aortic valve regurgitation is not visualized. Aortic valve mean gradient measures 5.0 mmHg. Aortic valve peak gradient measures 10.5 mmHg. Aortic valve area, by VTI measures 2.58 cm. Pulmonic Valve: The pulmonic valve was normal in structure. Pulmonic valve regurgitation is not visualized. Aorta: The aortic root and ascending aorta are structurally normal, with no evidence of dilitation. IAS/Shunts: No atrial level shunt detected by color flow Doppler. Agitated saline contrast was given intravenously to evaluate for intracardiac shunting. Agitated saline contrast bubble study was negative, with no evidence of any interatrial shunt.  LEFT VENTRICLE PLAX 2D LVIDd:          5.20 cm   Diastology LVIDs:         3.50 cm   LV e' medial:    8.38 cm/s LV PW:         0.90 cm   LV E/e' medial:  10.0 LV IVS:        0.90 cm   LV e' lateral:  8.81 cm/s LVOT diam:     2.00 cm   LV E/e' lateral: 9.5 LV SV:         94 LV SV Index:   40 LVOT Area:     3.14 cm  RIGHT VENTRICLE             IVC RV Basal diam:  4.50 cm     IVC diam: 1.90 cm RV Mid diam:    4.10 cm RV S prime:     11.30 cm/s TAPSE (M-mode): 2.8 cm LEFT ATRIUM             Index        RIGHT ATRIUM           Index LA diam:        3.80 cm 1.60 cm/m   RA Area:     17.60 cm LA Vol (A2C):   70.7 ml 29.77 ml/m  RA Volume:   49.60 ml  20.89 ml/m LA Vol (A4C):   46.0 ml 19.37 ml/m LA Biplane Vol: 58.7 ml 24.72 ml/m  AORTIC VALVE AV Area (Vmax):    2.73 cm AV Area (Vmean):   2.64 cm AV Area (VTI):     2.58 cm AV Vmax:           162.00 cm/s AV Vmean:          106.000 cm/s AV VTI:            0.365 m AV Peak Grad:      10.5 mmHg AV Mean Grad:      5.0 mmHg LVOT Vmax:         141.00 cm/s LVOT Vmean:        89.100 cm/s LVOT VTI:          0.300 m LVOT/AV VTI ratio: 0.82  AORTA Ao Root diam: 3.30 cm Ao Asc diam:  3.00 cm MITRAL VALVE                TRICUSPID VALVE MV Area (PHT): 2.69 cm     TR Peak grad:   18.0 mmHg MV Decel Time: 282 msec     TR Vmax:        212.00 cm/s MV E velocity: 83.40 cm/s MV A velocity: 114.00 cm/s  SHUNTS MV E/A ratio:  0.73         Systemic VTI:  0.30 m                             Systemic Diam: 2.00 cm Mertie Moores MD Electronically signed by Mertie Moores MD Signature Date/Time: 05/31/2021/2:21:49 PM    Final      PERTINENT LAB RESULTS: CBC: Recent Labs    05/30/21 1652 05/30/21 1813  WBC 9.3  --   HGB 13.4 13.9  HCT 42.1 41.0  PLT 172  --    CMET CMP     Component Value Date/Time   NA 142 05/30/2021 1813   NA 136 01/16/2013 0616   K 4.2 05/30/2021 1813   K 3.5 01/16/2013 0616   CL 105 05/30/2021 1813   CL 105 01/16/2013 0616   CO2 25 05/30/2021 1652   CO2 26 01/16/2013 0616    GLUCOSE 106 (H) 05/30/2021 1813   GLUCOSE 126 (H) 01/16/2013 0616   BUN 23 05/30/2021 1813   BUN 10 01/16/2013 0616   CREATININE 1.00 05/30/2021 1813   CREATININE 0.69 01/16/2013 0616   CALCIUM  9.4 05/30/2021 1652   CALCIUM 9.0 01/16/2013 0616   PROT 7.1 05/30/2021 1652   ALBUMIN 4.2 05/30/2021 1652   AST 18 05/30/2021 1652   ALT 22 05/30/2021 1652   ALKPHOS 56 05/30/2021 1652   BILITOT 0.5 05/30/2021 1652   GFRNONAA >60 05/30/2021 1652   GFRNONAA >60 01/16/2013 0616   GFRAA >60 01/16/2013 0616    GFR Estimated Creatinine Clearance: 85.6 mL/min (by C-G formula based on SCr of 1 mg/dL). No results for input(s): LIPASE, AMYLASE in the last 72 hours. No results for input(s): CKTOTAL, CKMB, CKMBINDEX, TROPONINI in the last 72 hours. Invalid input(s): POCBNP No results for input(s): DDIMER in the last 72 hours. Recent Labs    05/31/21 0321  HGBA1C 5.3   Recent Labs    05/31/21 0321  CHOL 142  HDL 51  LDLCALC 75  TRIG 79  CHOLHDL 2.8  LDLDIRECT 74.5   No results for input(s): TSH, T4TOTAL, T3FREE, THYROIDAB in the last 72 hours.  Invalid input(s): FREET3 No results for input(s): VITAMINB12, FOLATE, FERRITIN, TIBC, IRON, RETICCTPCT in the last 72 hours. Coags: Recent Labs    05/30/21 1652  INR 1.0   Microbiology: Recent Results (from the past 240 hour(s))  Resp Panel by RT-PCR (Flu A&B, Covid) Nasopharyngeal Swab     Status: None   Collection Time: 05/31/21  5:36 AM   Specimen: Nasopharyngeal Swab; Nasopharyngeal(NP) swabs in vial transport medium  Result Value Ref Range Status   SARS Coronavirus 2 by RT PCR NEGATIVE NEGATIVE Final    Comment: (NOTE) SARS-CoV-2 target nucleic acids are NOT DETECTED.  The SARS-CoV-2 RNA is generally detectable in upper respiratory specimens during the acute phase of infection. The lowest concentration of SARS-CoV-2 viral copies this assay can detect is 138 copies/mL. A negative result does not preclude SARS-Cov-2 infection and  should not be used as the sole basis for treatment or other patient management decisions. A negative result may occur with  improper specimen collection/handling, submission of specimen other than nasopharyngeal swab, presence of viral mutation(s) within the areas targeted by this assay, and inadequate number of viral copies(<138 copies/mL). A negative result must be combined with clinical observations, patient history, and epidemiological information. The expected result is Negative.  Fact Sheet for Patients:  EntrepreneurPulse.com.au  Fact Sheet for Healthcare Providers:  IncredibleEmployment.be  This test is no t yet approved or cleared by the [place] FDA and  has been authorized for detection and/or diagnosis of SARS-CoV-2 by FDA under an Emergency Use Authorization (EUA). This EUA will remain  in effect (meaning this test can be used) for the duration of the COVID-19 declaration under Section 564(b)(1) of the Act, 21 U.S.C.section 360bbb-3(b)(1), unless the authorization is terminated  or revoked sooner.       Influenza A by PCR NEGATIVE NEGATIVE Final   Influenza B by PCR NEGATIVE NEGATIVE Final    Comment: (NOTE) The Xpert Xpress SARS-CoV-2/FLU/RSV plus assay is intended as an aid in the diagnosis of influenza from Nasopharyngeal swab specimens and should not be used as a sole basis for treatment. Nasal washings and aspirates are unacceptable for Xpert Xpress SARS-CoV-2/FLU/RSV testing.  Fact Sheet for Patients: EntrepreneurPulse.com.au  Fact Sheet for Healthcare Providers: IncredibleEmployment.be  This test is not yet approved or cleared by the [place] FDA and has been authorized for detection and/or diagnosis of SARS-CoV-2 by FDA under an Emergency Use Authorization (EUA). This EUA will remain in effect (meaning this test can be used) for the duration  of the COVID-19 declaration  under Section 564(b)(1) of the Act, 21 U.S.C. section 360bbb-3(b)(1), unless the authorization is terminated or revoked.  Performed at [facility], [place] [address] [place], [place] [address]     FURTHER DISCHARGE INSTRUCTIONS:  Get Medicines reviewed and adjusted: Please take all your medications with you for your next visit with your Primary MD  Laboratory/radiological data: Please request your Primary MD to go over all hospital tests and procedure/radiological results at the follow up, please ask your Primary MD to get all Hospital records sent to his/her office.  In some cases, they will be blood work, cultures and biopsy results pending at the time of your discharge. Please request that your primary care M.D. goes through all the records of your hospital data and follows up on these results.  Also Note the following: If you experience worsening of your admission symptoms, develop shortness of breath, life threatening emergency, suicidal or homicidal thoughts you must seek medical attention immediately by calling 911 or calling your MD immediately  if symptoms less severe.  You must read complete instructions/literature along with all the possible adverse reactions/side effects for all the Medicines you take and that have been prescribed to you. Take any new Medicines after you have completely understood and accpet all the possible adverse reactions/side effects.   Do not drive when taking Pain medications or sleeping medications (Benzodaizepines)  Do not take more than prescribed Pain, Sleep and Anxiety Medications. It is not advisable to combine anxiety,sleep and pain medications without talking with your primary care practitioner  Special Instructions: If you have smoked or chewed Tobacco  in the last 2 yrs please stop smoking, stop any regular Alcohol  and or any Recreational drug use.  Wear Seat belts while driving.  Please note: You were cared for by a hospitalist  during your hospital stay. Once you are discharged, your primary care physician will handle any further medical issues. Please note that NO REFILLS for any discharge medications will be authorized once you are discharged, as it is imperative that you return to your primary care physician (or establish a relationship with a primary care physician if you do not have one) for your post hospital discharge needs so that they can reassess your need for medications and monitor your lab values.  Total Time spent coordinating discharge including counseling, education and face to face time equals greater than 30 minutes.  SignedOren Binet 05/31/2021 4:04 PM

## 2021-05-31 NOTE — Progress Notes (Signed)
Echocardiogram ?2D Echocardiogram has been performed. ? ?Alan [name] ?05/31/2021, 11:17 AM ?

## 2021-05-31 NOTE — Evaluation (Signed)
Physical Therapy Evaluation ?Patient Details ?Name: Alan [name] ?MRN: [id number] ?DOB: 01-05-1946 ?Today's Date: 05/31/2021 ? ?History of Present Illness ? Pt is a 76 y/o male presenting on 2/28 with diplopia. Brain MRI and MRA negative for acute CVA. PMH includes: L carotid artery stenosis, COPD, OA, HTN, L TKA.  ?Clinical Impression ? Patient presented to the ED on 05/30/21 with c/o double vision. Pt was able to walk independently while wearing partial occlusion glasses provided by OT to decrease double vision. Pt performed DGI tasks without LOB. PT educated pt on foot placement during stair navigation. SPT recommending no further acute physical therapy services since patient is independent with all mobility tasks. PT will sign off. If changes occur, please reconsult.  ?   ?   ? ?Recommendations for follow up therapy are one component of a multi-disciplinary discharge planning process, led by the attending physician.  Recommendations may be updated based on patient status, additional functional criteria and insurance authorization. ? ?Follow Up Recommendations No PT follow up ? ?  ?Assistance Recommended at Discharge Intermittent Supervision/Assistance  ?Patient can return home with the following ? Assist for transportation;Help with stairs or ramp for entrance ? ?  ?Equipment Recommendations None recommended by PT  ?Recommendations for Other Services ?    ?  ?Functional Status Assessment Patient has had a recent decline in their functional status and demonstrates the ability to make significant improvements in function in a reasonable and predictable amount of time.  ? ?  ?Precautions / Restrictions Precautions ?Precautions: Fall ?Precaution Comments: diplopia ?Restrictions ?Weight Bearing Restrictions: No  ? ?  ? ?Mobility ? Bed Mobility ?  ?  ?  ?  ?  ?  ?  ?General bed mobility comments: EOB upon entry ?  ? ?Transfers ?Overall transfer level: Independent ?Equipment used: None ?  ?  ?  ?  ?  ?  ?  ?  ?   ? ?Ambulation/Gait ?Ambulation/Gait assistance: Independent ?Gait Distance (Feet): 125 Feet ?Assistive device: None ?Gait Pattern/deviations: WFL(Within Functional Limits) ?Gait velocity: normal ?Gait velocity interpretation: >4.37 ft/sec, indicative of normal walking speed ?  ?General Gait Details: pt walked with step through pattern, no sway or gait deviation noted. Pt was wearing partial occlusion glasses from OT session and reported they really help, especially with walking. Pt able to perform all DGI tasks without difficulty ? ?Stairs ?  ?  ?  ?  ?  ? ?Wheelchair Mobility ?  ? ?Modified Rankin (Stroke Patients Only) ?  ? ?  ? ?Balance Overall balance assessment: Independent ?  ?  ?  ?  ?  ?  ?  ?  ?  ?  ?  ?  ?  ?  ?  ?Standardized Balance Assessment ?Standardized Balance Assessment : Dynamic Gait Index ?  ?Dynamic Gait Index ?Level Surface: Normal ?Change in Gait Speed: Normal ?Gait with Horizontal Head Turns: Normal ?Gait with Vertical Head Turns: Normal ?Gait and Pivot Turn: Normal ?Step Over Obstacle: Normal ?Step Around Obstacles: Normal ?   ? ? ? ?Pertinent Vitals/Pain Pain Assessment ?Pain Assessment: No/denies pain  ? ? ?Home Living Family/patient expects to be discharged to:: Private residence ?Living Arrangements: Alone ?Available Help at Discharge: Other (Comment);Available 24 hours/day (SO) ?Type of Home: House ?Home Access: Stairs to enter ?Entrance Stairs-Rails: None ?Entrance Stairs-Number of Steps: 2 ?  ?Home Layout: One level ?Home Equipment: Conservation officer, nature (2 wheels);Cane - single point;Wheelchair - manual;BSC/3in1 ?Additional Comments: reports he will be staying with his SO  following D/C until his eye issue is resolved. Above home living reflects his SO's home layout  ?  ?Prior Function Prior Level of Function : Independent/Modified Independent;Driving ?  ?  ?  ?  ?  ?  ?Mobility Comments: walks without assistive devices ?  ?  ? ? ?Hand Dominance  ? Dominant Hand: Left ? ?  ?Extremity/Trunk  Assessment  ? Upper Extremity Assessment ?Upper Extremity Assessment: Defer to OT evaluation ?  ? ?Lower Extremity Assessment ?Lower Extremity Assessment: Overall WFL for tasks assessed ?  ? ?Cervical / Trunk Assessment ?Cervical / Trunk Assessment: Normal  ?Communication  ? Communication: No difficulties  ?Cognition Arousal/Alertness: Awake/alert ?Behavior During Therapy: [facility] for tasks assessed/performed ?Overall Cognitive Status: Within Functional Limits for tasks assessed ?  ?  ?  ?  ?  ?  ?  ?  ?  ?  ?  ?  ?  ?  ?  ?  ?  ?  ?  ? ?  ?General Comments General comments (skin integrity, edema, etc.): PT educated pt on navigating stairs safely due to diploplia; safe foot placement and use of railing for support. ? ?  ?Exercises    ? ?Assessment/Plan  ?  ?PT Assessment Patient does not need any further PT services  ?PT Problem List   ? ?   ?  ?PT Treatment Interventions     ? ?PT Goals (Current goals can be found in the Care Plan section)  ?Acute Rehab PT Goals ?Patient Stated Goal: to go home and eat ?PT Goal Formulation: With patient ?Time For Goal Achievement: 05/31/21 ?Potential to Achieve Goals: Good ? ?  ?Frequency   ?  ? ? ?Co-evaluation   ?  ?  ?  ?  ? ? ?  ?AM-PAC PT "6 Clicks" Mobility  ?Outcome Measure Help needed turning from your back to your side while in a flat bed without using bedrails?: None ?Help needed moving from lying on your back to sitting on the side of a flat bed without using bedrails?: None ?Help needed moving to and from a bed to a chair (including a wheelchair)?: None ?Help needed standing up from a chair using your arms (e.g., wheelchair or bedside chair)?: None ?Help needed to walk in hospital room?: None ?Help needed climbing 3-5 steps with a railing? : None ?6 Click Score: 24 ? ?  ?End of Session Equipment Utilized During Treatment: Gait belt ?Activity Tolerance: Patient tolerated treatment well ?Patient left: in bed;with call bell/phone within reach (sitting EOB with tray of  breakfast) ?Nurse Communication: Mobility status ?PT Visit Diagnosis: Other symptoms and signs involving the nervous system (R29.898) ?  ? ?Time: 3202-3343 ?PT Time Calculation (min) (ACUTE ONLY): 10 min ? ? ?Charges:   PT Evaluation ?$PT Eval Low Complexity: 1 Low ?  ?  ?   ? ? ?Jonne Ply, SPT ? ?Jonne Ply ?05/31/2021, 11:26 AM ? ?

## 2021-05-31 NOTE — Progress Notes (Addendum)
STROKE TEAM PROGRESS NOTE   INTERVAL HISTORY  Seen by ophthalmology after waking up with diplopia.  Sent to the ED for further work-up  Stopped taking plavix due to concern for side effects despite recommendation by his vascular surgeon.  And wants to take ASA 325 intstead. Has follow up with vascular on 3/13. Will get in touch with vascular to see if they are okay with brilinta- Dr. Delana Meyer   Vitals:   05/31/21 0345 05/31/21 0700 05/31/21 0800 05/31/21 1005  BP: 133/83 (!) 131/57 135/79 (!) 143/77  Pulse: 60 (!) 55 72 75  Resp: 16 18 16 18   Temp:    98.5 F (36.9 C)  TempSrc:    Oral  SpO2: 92% 93% 93% 93%  Weight:      Height:       CBC:  Recent Labs  Lab 05/30/21 1652 05/30/21 1813  WBC 9.3  --   NEUTROABS 6.6  --   HGB 13.4 13.9  HCT 42.1 41.0  MCV 89.8  --   PLT 172  --    Basic Metabolic Panel:  Recent Labs  Lab 05/30/21 1652 05/30/21 1813  NA 138 142  K 4.2 4.2  CL 103 105  CO2 25  --   GLUCOSE 114* 106*  BUN 19 23  CREATININE 1.00 1.00  CALCIUM 9.4  --    Lipid Panel:  Recent Labs  Lab 05/31/21 0321  CHOL 142  TRIG 79  HDL 51  CHOLHDL 2.8  VLDL 16  LDLCALC 75   HgbA1c:  Recent Labs  Lab 05/31/21 0321  HGBA1C 5.3   Urine Drug Screen: No results for input(s): LABOPIA, COCAINSCRNUR, LABBENZ, AMPHETMU, THCU, LABBARB in the last 168 hours.  Alcohol Level No results for input(s): ETH in the last 168 hours.  IMAGING past 24 hours CT Head Wo Contrast  Result Date: 05/30/2021 CLINICAL DATA:  Diplopia starting at 8:30 today.  Recent headache. EXAM: CT HEAD WITHOUT CONTRAST TECHNIQUE: Contiguous axial images were obtained from the base of the skull through the vertex without intravenous contrast. RADIATION DOSE REDUCTION: This exam was performed according to the departmental dose-optimization program which includes automated exposure control, adjustment of the mA and/or kV according to patient size and/or use of iterative reconstruction technique.  COMPARISON:  11/18/2020 and MRI brain from 11/11/2020 FINDINGS: Brain: Chronic mild encephalomalacia along the medial anterior frontal lobes bilaterally, unchanged. Otherwise, the brainstem, cerebellum, cerebral peduncles, thalamus, basal ganglia, basilar cisterns, and ventricular system appear within normal limits. No intracranial hemorrhage, mass lesion, or acute CVA. Vascular: There is atherosclerotic calcification of the cavernous carotid arteries bilaterally. Skull: Unremarkable Sinuses/Orbits: Unremarkable Other: Unremarkable IMPRESSION: 1. No acute intracranial findings. 2. 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 3. Atherosclerosis. Electronically Signed   By: Van Clines M.D.   On: 05/30/2021 17:32   MR ANGIO HEAD WO CONTRAST  Result Date: 05/30/2021 CLINICAL DATA:  Diplopia.  Rule out stroke EXAM: MRI HEAD WITHOUT AND WITH CONTRAST MRA HEAD WITHOUT CONTRAST MRA NECK WITHOUT AND WITH CONTRAST TECHNIQUE: Multiplanar, multiecho pulse sequences of the brain and surrounding structures were obtained without and with intravenous contrast. Angiographic images of the Circle of Willis were obtained using MRA technique without intravenous contrast. Angiographic images of the neck were obtained using MRA technique without and with intravenous contrast. Carotid stenosis measurements (when applicable) are obtained utilizing NASCET criteria, using the distal internal carotid diameter as the denominator. CONTRAST:  53mL GADAVIST GADOBUTROL 1 MMOL/ML IV SOLN  COMPARISON:  CT head 05/30/2021 FINDINGS: MRI HEAD FINDINGS Brain: Negative for acute infarct. Mild white matter changes consistent with chronic microvascular ischemia. Brainstem and cerebellum intact. Negative for hemorrhage, mass, or fluid collection. Ventricle size and cerebral volume normal for age. Normal enhancement of the brain postcontrast administration. Vascular: Loss of flow void left internal  carotid artery through the skull base and cavernous segment compatible with occlusion. Otherwise normal arterial flow voids. Skull and upper cervical spine: No focal skeletal lesion. Sinuses/Orbits: Mild mucosal edema paranasal sinuses. Bilateral cataract extraction Other: None MRA HEAD FINDINGS Minimal flow left internal carotid artery. Small amount of flow related signal in the left internal carotid artery in the neck and cavernous carotid. This is similar to prior CTA of 11/18/2020. Right internal carotid artery widely patent. Anterior and middle cerebral arteries patent bilaterally without stenosis or large vessel occlusion. Negative for aneurysm. Internal carotid artery patent bilaterally. Right PICA patent. Left PICA not visualized. Left AICA patent. Basilar widely patent. Superior cerebellar and posterior cerebral arteries patent. Fetal origin left posterior cerebral artery. Negative for aneurysm. MRA NECK FINDINGS Normal aortic arch. Innominate artery widely patent. Left subclavian artery widely patent Moderate stenosis at the origin of the left common carotid artery. Left common carotid artery is a small vessel with slow flow. This vessel appears to occlude near the carotid bifurcation however there could be a small branch patent. Left internal carotid artery may be occluded or have slow flow. This is difficult to determine due to enhancing collateral vessels in the neck. Both vertebral arteries are patent without stenosis in the neck. IMPRESSION: 1. Negative for acute infarct. Mild chronic microvascular ischemic change in the white matter. 2. 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 Possible occlusion of the distal left common carotid artery. Possible occlusion versus minimal flow left internal carotid artery. The MRA head suggests there is a small amount of flow in the left internal carotid artery through the skull base. This is similar to the prior CT angiogram 11/18/2020.  Electronically Signed   By: Franchot Gallo M.D.   On: 05/30/2021 19:48   MR Angiogram Neck W or Wo Contrast  Result Date: 05/30/2021 CLINICAL DATA:  Diplopia.  Rule out stroke EXAM: MRI HEAD WITHOUT AND WITH CONTRAST MRA HEAD WITHOUT CONTRAST MRA NECK WITHOUT AND WITH CONTRAST TECHNIQUE: Multiplanar, multiecho pulse sequences of the brain and surrounding structures were obtained without and with intravenous contrast. Angiographic images of the Circle of Willis were obtained using MRA technique without intravenous contrast. Angiographic images of the neck were obtained using MRA technique without and with intravenous contrast. Carotid stenosis measurements (when applicable) are obtained utilizing NASCET criteria, using the distal internal carotid diameter as the denominator. CONTRAST:  49mL GADAVIST GADOBUTROL 1 MMOL/ML IV SOLN COMPARISON:  CT head 05/30/2021 FINDINGS: MRI HEAD FINDINGS Brain: Negative for acute infarct. Mild white matter changes consistent with chronic microvascular ischemia. Brainstem and cerebellum intact. Negative for hemorrhage, mass, or fluid collection. Ventricle size and cerebral volume normal for age. Normal enhancement of the brain postcontrast administration. Vascular: Loss of flow void left internal carotid artery through the skull base and cavernous segment compatible with occlusion. Otherwise normal arterial flow voids. Skull and upper cervical spine: No focal skeletal lesion. Sinuses/Orbits: Mild mucosal edema paranasal sinuses. Bilateral cataract extraction Other: None MRA HEAD FINDINGS Minimal flow left internal carotid artery. Small amount of flow related signal in the left internal carotid artery in the neck and cavernous carotid. This is similar to prior  CTA of 11/18/2020. Right internal carotid artery widely patent. Anterior and middle cerebral arteries patent bilaterally without stenosis or large vessel occlusion. Negative for aneurysm. Internal carotid artery patent  bilaterally. Right PICA patent. Left PICA not visualized. Left AICA patent. Basilar widely patent. Superior cerebellar and posterior cerebral arteries patent. Fetal origin left posterior cerebral artery. Negative for aneurysm. MRA NECK FINDINGS Normal aortic arch. Innominate artery widely patent. Left subclavian artery widely patent Moderate stenosis at the origin of the left common carotid artery. Left common carotid artery is a small vessel with slow flow. This vessel appears to occlude near the carotid bifurcation however there could be a small branch patent. Left internal carotid artery may be occluded or have slow flow. This is difficult to determine due to enhancing collateral vessels in the neck. Both vertebral arteries are patent without stenosis in the neck. IMPRESSION: 1. Negative for acute infarct. Mild chronic microvascular ischemic change in the white matter. 2. 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 Possible occlusion of the distal left common carotid artery. Possible occlusion versus minimal flow left internal carotid artery. The MRA head suggests there is a small amount of flow in the left internal carotid artery through the skull base. This is similar to the prior CT angiogram 11/18/2020. Electronically Signed   By: Franchot Gallo M.D.   On: 05/30/2021 19:48   MR BRAIN W WO CONTRAST  Result Date: 05/30/2021 CLINICAL DATA:  Diplopia.  Rule out stroke EXAM: MRI HEAD WITHOUT AND WITH CONTRAST MRA HEAD WITHOUT CONTRAST MRA NECK WITHOUT AND WITH CONTRAST TECHNIQUE: Multiplanar, multiecho pulse sequences of the brain and surrounding structures were obtained without and with intravenous contrast. Angiographic images of the Circle of Willis were obtained using MRA technique without intravenous contrast. Angiographic images of the neck were obtained using MRA technique without and with intravenous contrast. Carotid stenosis measurements (when applicable) are obtained utilizing  NASCET criteria, using the distal internal carotid diameter as the denominator. CONTRAST:  68mL GADAVIST GADOBUTROL 1 MMOL/ML IV SOLN COMPARISON:  CT head 05/30/2021 FINDINGS: MRI HEAD FINDINGS Brain: Negative for acute infarct. Mild white matter changes consistent with chronic microvascular ischemia. Brainstem and cerebellum intact. Negative for hemorrhage, mass, or fluid collection. Ventricle size and cerebral volume normal for age. Normal enhancement of the brain postcontrast administration. Vascular: Loss of flow void left internal carotid artery through the skull base and cavernous segment compatible with occlusion. Otherwise normal arterial flow voids. Skull and upper cervical spine: No focal skeletal lesion. Sinuses/Orbits: Mild mucosal edema paranasal sinuses. Bilateral cataract extraction Other: None MRA HEAD FINDINGS Minimal flow left internal carotid artery. Small amount of flow related signal in the left internal carotid artery in the neck and cavernous carotid. This is similar to prior CTA of 11/18/2020. Right internal carotid artery widely patent. Anterior and middle cerebral arteries patent bilaterally without stenosis or large vessel occlusion. Negative for aneurysm. Internal carotid artery patent bilaterally. Right PICA patent. Left PICA not visualized. Left AICA patent. Basilar widely patent. Superior cerebellar and posterior cerebral arteries patent. Fetal origin left posterior cerebral artery. Negative for aneurysm. MRA NECK FINDINGS Normal aortic arch. Innominate artery widely patent. Left subclavian artery widely patent Moderate stenosis at the origin of the left common carotid artery. Left common carotid artery is a small vessel with slow flow. This vessel appears to occlude near the carotid bifurcation however there could be a small branch patent. Left internal carotid artery may be occluded or have slow flow. This is difficult  to determine due to enhancing collateral vessels in the neck.  Both vertebral arteries are patent without stenosis in the neck. IMPRESSION: 1. Negative for acute infarct. Mild chronic microvascular ischemic change in the white matter. 2. 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 Possible occlusion of the distal left common carotid artery. Possible occlusion versus minimal flow left internal carotid artery. The MRA head suggests there is a small amount of flow in the left internal carotid artery through the skull base. This is similar to the prior CT angiogram 11/18/2020. Electronically Signed   By: Franchot Gallo M.D.   On: 05/30/2021 19:48    PHYSICAL EXAM  Physical Exam  Constitutional: Appears well-developed and well-nourished. Sitting up on side of bed.  Psych: Affect appropriate to situation Eyes: No scleral injection HENT: No OP obstrucion MSK: no joint deformities.  Cardiovascular: Normal rate and regular rhythm.  Respiratory: Effort normal, non-labored breathing GI: Soft.  No distension. There is no tenderness.  Skin: WDI  Neuro: Mental Status: Patient is awake, alert, oriented to person, place, month, year, and situation. Patient is able to give a clear and coherent history. No signs of aphasia or neglect Cranial Nerves: II: Visual Fields are full. Pupils are equal, round, and reactive to light.  Midline diplopia made worse on Left gaze. None on right gaze. Improved diplopia when glasses taped up by OT. V: Facial sensation is symmetric to temperature VII: Facial movement is symmetric resting and smiling VIII: Hearing is intact to voice X: Palate elevates symmetrically XI: Shoulder shrug is symmetric. XII: Tongue protrudes midline without atrophy or fasciculations.  Motor: Tone is normal. Bulk is normal. 5/5 strength was present in all four extremities.  Sensory: Sensation is symmetric to light touch and temperature in the arms and legs. No extinction to DSS present.  Deep Tendon Reflexes: 2+ and symmetric in the  biceps and patellae.  Plantars: Toes are downgoing bilaterally.  Cerebellar: FNF and HKS are intact bilaterally   ASSESSMENT/PLAN Mr. Alan [name] is a 76 y.o. male with history of Carotid artery stenosis, esophageal reflux, glaucoma, lymphoma, OSTEOARTHRITIS, basal cell carcinoma presenting with diplopia.  He was seen by ophthalmology 05/30/2021 and thought to have a skew deviation which was suspicious for stroke.  Stroke small brainstem CVA that is MRI neg. 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 secondary due to thrombosis source Code Stroke CT head No acute abnormality.  MRI negative for acute infarct, mild chronic microvascular ischemic change.   MRA  Slow flow in the left common carotid artery with proximal stenosis.  Possible occlusion of the left difficult common carotid artery.  Possible occlusion versus minimal flow left ICA 2D Echo 60-65%. No PFO. LDL 75 HgbA1c 5.3 VTE prophylaxis -SCDs    Diet   Diet Heart Room service appropriate? Yes; Fluid consistency: Thin   aspirin 81 mg daily prior to admission, now on aspirin 325 mg daily and Brilinta (ticagrelor) 90 mg bid.  Therapy recommendations: Pending Disposition: Pending  Hypertension Home meds: Amlodipine Stable Permissive hypertension (OK if < 220/120) but gradually normalize in 5-7 days Long-term BP goal normotensive  Hyperlipidemia Home meds: Atorvastatin 80 mg, resumed in hospital LDL 75, goal < 70 Continue statin at discharge   Other Stroke Risk Factors Advanced Age >/= 80  Cigarette smoker, advised to stop smoking ETOH use, alcohol level No results found for requested labs within last 26280 hours., advised to drink no more than 2 drink(s) a day Substance abuse - UDS:  THC No results  found for requested labs within last 26280 hours., Cocaine No results found for requested labs within last 26280 hours.. Patient advised to stop using due to stroke risk. 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  He was started on aspirin and Plavix however he only took 1 dose of Plavix and discontinued due to the side effects he read about    Hospital day # 0  Patient left gaze diplopia likely MRI negative small brainstem stroke.  History of left ICA being managed by vascular specialist.  He has a vascular surgeon he is seeing outpatient has an appointment coming up next week.  He does not want to be on Plavix.  We will plan on Brilinta and aspirin full dose and he will discuss with his vascular surgeon about length of time he discussed medication.  He can follow-up with stroke clinic after discharge.  Plan discussed with Dr. Sloan Leiter via telephone.  Total of 35 mins spent reviewing chart, discussion with patient and family on prognosis, Dx and plan. Discussed case with patient's nurse. Reviewed Imaging personally.   ADDENDUM: rationale to take aspirin 325mg  is that he is hesitant to take plavix (which he stopped on his own and had a stroke event) or brillinta is that if he abruptly stops brillinta he would have at least a full dose aspirin to protect him eventhought aspirin above 100mg  makes brillinta maybe not as helpful.  To contact Stroke Continuity provider, please refer to [email or web address] After hours, contact General Neurology

## 2021-06-01 ENCOUNTER — Telehealth (INDEPENDENT_AMBULATORY_CARE_PROVIDER_SITE_OTHER): Payer: Self-pay

## 2021-06-01 NOTE — Telephone Encounter (Signed)
Patient left a voicemail stating that he was having double vision on Tuesday was seen at his eye doctor. Patient was sent to ED for evaluation and been placed on Brilinta. Patient has appointment scheduled for 06/12/21. Dr Delana Meyer was made aware and recommended the patient appointment staying as scheduled. ?

## 2021-06-02 ENCOUNTER — Encounter: Payer: Self-pay | Admitting: Nurse Practitioner

## 2021-06-02 ENCOUNTER — Other Ambulatory Visit: Payer: Self-pay

## 2021-06-02 ENCOUNTER — Ambulatory Visit (INDEPENDENT_AMBULATORY_CARE_PROVIDER_SITE_OTHER): Payer: Medicare HMO | Admitting: Nurse Practitioner

## 2021-06-02 VITALS — BP 128/62 | HR 67 | Temp 97.1°F | Resp 14 | Ht 72.0 in | Wt 254.4 lb

## 2021-06-02 DIAGNOSIS — Z8673 Personal history of transient ischemic attack (TIA), and cerebral infarction without residual deficits: Secondary | ICD-10-CM | POA: Insufficient documentation

## 2021-06-02 DIAGNOSIS — I639 Cerebral infarction, unspecified: Secondary | ICD-10-CM

## 2021-06-02 LAB — COMPREHENSIVE METABOLIC PANEL
ALT: 18 U/L (ref 0–53)
AST: 17 U/L (ref 0–37)
Albumin: 4.2 g/dL (ref 3.5–5.2)
Alkaline Phosphatase: 66 U/L (ref 39–117)
BUN: 17 mg/dL (ref 6–23)
CO2: 30 mEq/L (ref 19–32)
Calcium: 9.4 mg/dL (ref 8.4–10.5)
Chloride: 106 mEq/L (ref 96–112)
Creatinine, Ser: 0.95 mg/dL (ref 0.40–1.50)
GFR: 78.63 mL/min (ref >60.00)
Glucose, Bld: 111 mg/dL — ABNORMAL HIGH (ref 70–99)
Potassium: 4.6 mEq/L (ref 3.5–5.1)
Sodium: 142 mEq/L (ref 135–145)
Total Bilirubin: 0.7 mg/dL (ref 0.2–1.2)
Total Protein: 7 g/dL (ref 6.0–8.3)

## 2021-06-02 LAB — CBC
HCT: 39 % (ref 39.0–52.0)
Hemoglobin: 13.2 g/dL (ref 13.0–17.0)
MCHC: 33.9 g/dL (ref 30.0–36.0)
MCV: 87.5 fl (ref 78.0–100.0)
Platelets: 144 10*3/uL — ABNORMAL LOW (ref 150.0–400.0)
RBC: 4.46 Mil/uL (ref 4.22–5.81)
RDW: 13.1 % (ref 11.5–15.5)
WBC: 6.1 10*3/uL (ref 4.0–10.5)

## 2021-06-02 NOTE — Assessment & Plan Note (Signed)
Patient was admitted for acute CVA.  Nothing showed on CT MRI or MRA.  Due to start patient on Brilinta and full dose aspirin for 21 days.  He is to discontinue the aspirin after 21 days and continue Brilinta.  He has follow-up scheduled with vascular on 06/12/2021.  Referral for neurology is in the chart.  Did give patient information for [facility] if he does not hear from them in the next week or so he needs to call and make sure he has an appointment.  We will recheck CBC and CMP per discharging physician's request. ?

## 2021-06-02 NOTE — Telephone Encounter (Signed)
Opened in error

## 2021-06-02 NOTE — Progress Notes (Signed)
Established Patient Office Visit  Subjective:  [name], male    DOB: [birth year]  Age: 76 y.o. MRN: [id number]  CC:  Chief Complaint  Patient presents with   Hospitalization Follow-up    Feeling better, blurred vision is almost gone. Patient would like to discuss how long to take Aspirin 325 mg.    HPI Alan [name] presents for   Patient went to to eye doctor with the complaint of double vision. They sent to ED for stroke concern. [facility] went to [facility] and underwent a CT and MR that did not show an acute infraction. They admitted him to do a full stroke work up. Seen on 05/30/2021 and discharged on 05/31/2021. He is to follow up with vascular and neurology outpatient. Patient states that his symptoms have resolved.     Past Medical History:  Diagnosis Date   Allergic rhinitis, cause unspecified    Allergy    Cancer ([place])    Basal cell skin cancer,left ear   Carotid artery stenosis    left   Carpal tunnel syndrome    Chronic airway obstruction, not elsewhere classified    Esophageal reflux    Glaucoma (increased eye pressure)    Lipoma of unspecified site    Osteoarthrosis, unspecified whether generalized or localized, lower leg    Other and unspecified hyperlipidemia    Personal history of colonic polyps    Personal history of other malignant neoplasm of skin    Unspecified essential hypertension     Past Surgical History:  Procedure Laterality Date   bone spur  06-2003   right thumb   CATARACT EXTRACTION     KNEE ARTHROSCOPY  09-2007   partial medial mesicecotmy, patellar chonfroplasty   SHOULDER SURGERY  03-24-2007   removal bone spur   SHOULDER SURGERY Left 07/2016   [facility] Dr. Harmon Pier   TOTAL KNEE ARTHROPLASTY     03-2009 [facility]   TOTAL KNEE ARTHROPLASTY Left 12/2012   [facility]    Family History  Problem Relation Age of Onset   Diabetes Mother    Hyperlipidemia Mother    Hypertension Mother    Heart attack  Mother    Obesity Mother    Hyperlipidemia Father    Hypertension Father    Lung cancer Father    Colon polyps Father    Hyperlipidemia Brother    Hypertension Brother    Hyperlipidemia Brother    Hypertension Brother    Hypertension Sister    Hyperlipidemia Sister    Lung cancer Sister    Hyperlipidemia Sister    Drug abuse Daughter    Colon cancer Neg Hx    Esophageal cancer Neg Hx    Pancreatic cancer Neg Hx    Rectal cancer Neg Hx     Social History   Socioeconomic History   Marital status: Divorced    Spouse name: Not on file   Number of children: 2   Years of education: Not on file   Highest education level: Not on file  Occupational History   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  Tobacco Use   Smoking status: Former    Packs/day: 3.00    Years: 25.00    Pack years: 75.00    Types: Cigarettes    Quit date: 11/23/1996    Years since quitting: 24.5   Smokeless tobacco: Never  Vaping Use   Vaping Use: Never used  Substance and Sexual Activity   Alcohol use: Not Currently  Alcohol/week: 0.0 standard drinks   Drug use: No   Sexual activity: Yes    Partners: Female    Comment: monogamous relationship  Other Topics Concern   Not on file  Social History Narrative   Regular exercise -- no            Social Determinants of Health   Financial Resource Strain: Not on file  Food Insecurity: Not on file  Transportation Needs: Not on file  Physical Activity: Not on file  Stress: Not on file  Social Connections: Not on file  Intimate Partner Violence: Not on file    Outpatient Medications Prior to Visit  Medication Sig Dispense Refill   amLODipine (NORVASC) 10 MG tablet TAKE 1 TABLET BY MOUTH EVERY DAY FOR BLOOD PRESSURE (Patient taking differently: Take 10 mg by mouth daily. TAKE 1 TABLET BY MOUTH EVERY DAY for blood pressure.) 90 tablet 2   aspirin 325 MG EC tablet Take 1 tablet (325 mg total) by mouth daily for 21 days. 21 tablet 0   atorvastatin  (LIPITOR) 80 MG tablet TAKE 1 TABLET BY MOUTH EVERY DAY FOR CHOLESTEROL (Patient taking differently: Take 80 mg by mouth daily. TAKE 1 TABLET BY MOUTH EVERY DAY for cholesterol.) 90 tablet 2   fish oil-omega-3 fatty acids 1000 MG capsule Take 2 g by mouth daily.     losartan (COZAAR) 100 MG tablet TAKE 1 TABLET BY MOUTH EVERY DAY FOR BLOOD PRESSURE (Patient taking differently: Take 100 mg by mouth daily. TAKE 1 TABLET BY MOUTH EVERY DAY FOR BLOOD PRESSURE) 90 tablet 3   montelukast (SINGULAIR) 10 MG tablet TAKE 1 TABLET (10 MG TOTAL) BY MOUTH AT BEDTIME. FOR ALLERGIES 90 tablet 0   Multiple Vitamin (MULTIVITAMIN) tablet Take 1 tablet by mouth daily.     pantoprazole (PROTONIX) 40 MG tablet Take 1 tablet (40 mg total) by mouth daily. 30 tablet 2   sertraline (ZOLOFT) 50 MG tablet TAKE 1 TABLET BY MOUTH EVERY DAY FOR DEPRESSION (Patient taking differently: Take 50 mg by mouth daily. TAKE 1 TABLET BY MOUTH EVERY DAY for depression) 90 tablet 2   ticagrelor (BRILINTA) 90 MG TABS tablet Take 1 tablet (90 mg total) by mouth 2 (two) times daily. 60 tablet 3   albuterol (PROVENTIL HFA;VENTOLIN HFA) 108 (90 Base) MCG/ACT inhaler Inhale 2 puffs into the lungs every 4 (four) hours as needed for wheezing or shortness of breath (cough, shortness of breath or wheezing.). (Patient not taking: Reported on 05/30/2021) 1 Inhaler 1   cetirizine (ZYRTEC) 10 MG tablet TAKE 1 TABLET BY MOUTH DAILY AS NEEDED FOR ALLERGIES (Patient not taking: Reported on 05/30/2021) 90 tablet 2   SYMBICORT 160-4.5 MCG/ACT inhaler INHALE 2 PUFFS INTO THE LUNGS TWICE A DAY (Patient not taking: Reported on 05/30/2021) 30.6 Inhaler 1   Facility-Administered Medications Prior to Visit  Medication Dose Route Frequency Provider Last Rate Last Admin   0.9 %  sodium chloride infusion  500 mL Intravenous Once Pyrtle, Lajuan Lines, MD       triamcinolone acetonide (KENALOG) 10 MG/ML injection 10 mg  10 mg Other Once Regal, Norman S, DPM        No Known  Allergies  ROS Review of Systems  Eyes:  Negative for visual disturbance.  Respiratory:  Negative for shortness of breath.   Cardiovascular:  Negative for chest pain.  Neurological:  Negative for weakness, numbness and headaches.  Hematological:  Bruises/bleeds easily.     Objective:    Physical Exam  Vitals and nursing note reviewed.  Constitutional:      Appearance: He is obese.  Eyes:     Extraocular Movements: Extraocular movements intact.     Pupils: Pupils are equal, round, and reactive to light.  Neck:     Vascular: No carotid bruit.  Cardiovascular:     Rate and Rhythm: Normal rate and regular rhythm.     Heart sounds: Normal heart sounds.  Pulmonary:     Effort: Pulmonary effort is normal.     Breath sounds: Normal breath sounds.  Abdominal:     General: Bowel sounds are normal.  Musculoskeletal:     Right lower leg: No edema.     Left lower leg: No edema.  Lymphadenopathy:     Cervical: No cervical adenopathy.  Skin:    General: Skin is warm.  Neurological:     General: No focal deficit present.     Mental Status: He is alert.     Cranial Nerves: Cranial nerves 2-12 are intact.     Sensory: Sensation is intact.     Motor: Motor function is intact.     Gait: Gait normal.     Deep Tendon Reflexes:     Reflex Scores:      Bicep reflexes are 2+ on the right side and 2+ on the left side.      Patellar reflexes are 1+ on the right side and 1+ on the left side.    Comments: Bilateral upper and lower extremity strength 5/5    BP 128/62    Pulse 67    Temp (!) 97.1 F (36.2 C)    Resp 14    Ht 6' (1.829 m)    Wt 254 lb 7 oz (115.4 kg)    SpO2 98%    BMI 34.51 kg/m  Wt Readings from Last 3 Encounters:  06/02/21 254 lb 7 oz (115.4 kg)  05/30/21 258 lb 2.5 oz (117.1 kg)  05/24/21 258 lb 4 oz (117.1 kg)     There are no preventive care reminders to display for this patient.  There are no preventive care reminders to display for this patient.  Lab Results   Component Value Date   TSH 1.64 04/19/2017   Lab Results  Component Value Date   WBC 9.3 05/30/2021   HGB 13.9 05/30/2021   HCT 41.0 05/30/2021   MCV 89.8 05/30/2021   PLT 172 05/30/2021   Lab Results  Component Value Date   NA 142 05/30/2021   K 4.2 05/30/2021   CO2 25 05/30/2021   GLUCOSE 106 (H) 05/30/2021   BUN 23 05/30/2021   CREATININE 1.00 05/30/2021   BILITOT 0.5 05/30/2021   ALKPHOS 56 05/30/2021   AST 18 05/30/2021   ALT 22 05/30/2021   PROT 7.1 05/30/2021   ALBUMIN 4.2 05/30/2021   CALCIUM 9.4 05/30/2021   ANIONGAP 10 05/30/2021   GFR 86.66 10/21/2020   Lab Results  Component Value Date   CHOL 142 05/31/2021   Lab Results  Component Value Date   HDL 51 05/31/2021   Lab Results  Component Value Date   LDLCALC 75 05/31/2021   Lab Results  Component Value Date   TRIG 79 05/31/2021   Lab Results  Component Value Date   CHOLHDL 2.8 05/31/2021   Lab Results  Component Value Date   HGBA1C 5.3 05/31/2021      Assessment & Plan:   Problem List Items Addressed This Visit  Cardiovascular and Mediastinum   Cerebrovascular accident (CVA) [facility]) - Primary    Patient was admitted for acute CVA.  Nothing showed on CT MRI or MRA.  Due to start patient on Brilinta and full dose aspirin for 21 days.  He is to discontinue the aspirin after 21 days and continue Brilinta.  He has follow-up scheduled with vascular on 06/12/2021.  Referral for neurology is in the chart.  Did give patient information for [facility] if he does not hear from them in the next week or so he needs to call and make sure he has an appointment.  We will recheck CBC and CMP per discharging physician's request.      Relevant Orders   CBC   Comprehensive metabolic panel    No orders of the defined types were placed in this encounter.   Follow-up: No follow-ups on file.   This visit occurred during the SARS-CoV-2 public health emergency.  Safety protocols  were in place, including screening questions prior to the visit, additional usage of staff PPE, and extensive cleaning of exam room while observing appropriate contact time as indicated for disinfecting solutions.   Romilda Garret, NP

## 2021-06-02 NOTE — Patient Instructions (Addendum)
Continue taking the Brilinta and the full dose aspirin. Once you take all the pills of aspirin that the hospital filled you do NOT need to take any more aspirin just take the Brilinta. ?You should hear from the neurologist in the next 2 weeks if not give them a call at [phone]. It is called [facility] ? ?Keep your appointment with your vascular doctor. ? ?Follow up if you need anything ?

## 2021-06-07 ENCOUNTER — Telehealth: Payer: Self-pay | Admitting: Pharmacist

## 2021-06-07 NOTE — Telephone Encounter (Signed)
Attempted Pharmacy TOC call and phone call was not answered. VM has not been set up for this number. Will call back at a later date. ?

## 2021-06-08 ENCOUNTER — Other Ambulatory Visit (HOSPITAL_COMMUNITY): Payer: Self-pay

## 2021-06-12 ENCOUNTER — Other Ambulatory Visit: Payer: Self-pay

## 2021-06-12 ENCOUNTER — Ambulatory Visit (INDEPENDENT_AMBULATORY_CARE_PROVIDER_SITE_OTHER): Payer: Medicare HMO

## 2021-06-12 ENCOUNTER — Ambulatory Visit (INDEPENDENT_AMBULATORY_CARE_PROVIDER_SITE_OTHER): Payer: Medicare HMO | Admitting: Vascular Surgery

## 2021-06-12 ENCOUNTER — Encounter (INDEPENDENT_AMBULATORY_CARE_PROVIDER_SITE_OTHER): Payer: Self-pay | Admitting: Vascular Surgery

## 2021-06-12 VITALS — BP 135/71 | HR 71 | Resp 16 | Ht 72.0 in | Wt 252.2 lb

## 2021-06-12 DIAGNOSIS — I6523 Occlusion and stenosis of bilateral carotid arteries: Secondary | ICD-10-CM

## 2021-06-12 DIAGNOSIS — E785 Hyperlipidemia, unspecified: Secondary | ICD-10-CM

## 2021-06-12 DIAGNOSIS — I10 Essential (primary) hypertension: Secondary | ICD-10-CM | POA: Diagnosis not present

## 2021-06-12 DIAGNOSIS — J441 Chronic obstructive pulmonary disease with (acute) exacerbation: Secondary | ICD-10-CM

## 2021-06-12 DIAGNOSIS — I739 Peripheral vascular disease, unspecified: Secondary | ICD-10-CM | POA: Insufficient documentation

## 2021-06-12 NOTE — Progress Notes (Signed)
MRN : [id number]  Alan [name] is a 76 y.o. (1945/07/26) male who presents with chief complaint of check carotid.  History of Present Illness:   The patient is seen for follow up evaluation of carotid stenosis. The carotid stenosis followed by ultrasound.   However, he was recently in the hospital for double vision thought to be a CVA.  He r/o for CVA but his left ICA and CCA were again noted to likely be occluded.  The patient denies amaurosis fugax. There is no recent history of TIA symptoms or focal motor deficits. There is no prior documented CVA.  The patient is taking enteric-coated aspirin 81 mg daily.  There is no history of migraine headaches. There is no history of seizures.  The patient has a history of coronary artery disease, no recent episodes of angina or shortness of breath. The patient denies PAD or claudication symptoms. There is a history of hyperlipidemia which is being treated with a statin.    Carotid Duplex done today shows RICA 1-39% and Occlusion of the LICA.  No change compared to last study in 11/14/2020.   Current Meds  Medication Sig   amLODipine (NORVASC) 10 MG tablet TAKE 1 TABLET BY MOUTH EVERY DAY FOR BLOOD PRESSURE (Patient taking differently: Take 10 mg by mouth daily. TAKE 1 TABLET BY MOUTH EVERY DAY for blood pressure.)   aspirin 325 MG EC tablet Take 1 tablet (325 mg total) by mouth daily for 21 days.   atorvastatin (LIPITOR) 80 MG tablet TAKE 1 TABLET BY MOUTH EVERY DAY FOR CHOLESTEROL (Patient taking differently: Take 80 mg by mouth daily. TAKE 1 TABLET BY MOUTH EVERY DAY for cholesterol.)   fish oil-omega-3 fatty acids 1000 MG capsule Take 2 g by mouth daily.   losartan (COZAAR) 100 MG tablet TAKE 1 TABLET BY MOUTH EVERY DAY FOR BLOOD PRESSURE (Patient taking differently: Take 100 mg by mouth daily. TAKE 1 TABLET BY MOUTH EVERY DAY FOR BLOOD PRESSURE)   montelukast (SINGULAIR) 10 MG tablet TAKE 1 TABLET (10 MG TOTAL) BY MOUTH AT BEDTIME. FOR  ALLERGIES   Multiple Vitamin (MULTIVITAMIN) tablet Take 1 tablet by mouth daily.   pantoprazole (PROTONIX) 40 MG tablet Take 1 tablet (40 mg total) by mouth daily.   sertraline (ZOLOFT) 50 MG tablet TAKE 1 TABLET BY MOUTH EVERY DAY FOR DEPRESSION (Patient taking differently: Take 50 mg by mouth daily. TAKE 1 TABLET BY MOUTH EVERY DAY for depression)   ticagrelor (BRILINTA) 90 MG TABS tablet Take 1 tablet (90 mg total) by mouth 2 (two) times daily.   Current Facility-Administered Medications for the 06/12/21 encounter (Office Visit) with Delana Meyer, [name], MD  Medication   0.9 %  sodium chloride infusion   triamcinolone acetonide (KENALOG) 10 MG/ML injection 10 mg    Past Medical History:  Diagnosis Date   Allergic rhinitis, cause unspecified    Allergy    Cancer ([place])    Basal cell skin cancer,left ear   Carotid artery stenosis    left   Carpal tunnel syndrome    Chronic airway obstruction, not elsewhere classified    Esophageal reflux    Glaucoma (increased eye pressure)    Lipoma of unspecified site    Osteoarthrosis, unspecified whether generalized or localized, lower leg    Other and unspecified hyperlipidemia    Personal history of colonic polyps    Personal history of other malignant neoplasm of skin    Unspecified essential hypertension     Past  Surgical History:  Procedure Laterality Date   bone spur  06-2003   right thumb   CATARACT EXTRACTION     KNEE ARTHROSCOPY  09-2007   partial medial mesicecotmy, patellar chonfroplasty   SHOULDER SURGERY  03-24-2007   removal bone spur   SHOULDER SURGERY Left 07/2016   [facility], Dr. Harmon Pier   TOTAL KNEE ARTHROPLASTY     03-2009 [facility]   TOTAL KNEE ARTHROPLASTY Left 12/2012   [facility]    Social History Social History   Tobacco Use   Smoking status: Former    Packs/day: 3.00    Years: 25.00    Pack years: 75.00    Types: Cigarettes    Quit date: 11/23/1996    Years since quitting: 24.5    Smokeless tobacco: Never  Vaping Use   Vaping Use: Never used  Substance Use Topics   Alcohol use: Not Currently    Alcohol/week: 0.0 standard drinks   Drug use: No    Family History Family History  Problem Relation Age of Onset   Diabetes Mother    Hyperlipidemia Mother    Hypertension Mother    Heart attack Mother    Obesity Mother    Hyperlipidemia Father    Hypertension Father    Lung cancer Father    Colon polyps Father    Hyperlipidemia Brother    Hypertension Brother    Hyperlipidemia Brother    Hypertension Brother    Hypertension Sister    Hyperlipidemia Sister    Lung cancer Sister    Hyperlipidemia Sister    Drug abuse Daughter    Colon cancer Neg Hx    Esophageal cancer Neg Hx    Pancreatic cancer Neg Hx    Rectal cancer Neg Hx     No Known Allergies   REVIEW OF SYSTEMS (Negative unless checked)  Constitutional: '[]'$ Weight loss  '[]'$ Fever  '[]'$ Chills Cardiac: '[]'$ Chest pain   '[]'$ Chest pressure   '[]'$ Palpitations   '[]'$ Shortness of breath when laying flat   '[]'$ Shortness of breath with exertion. Vascular:  '[]'$ Pain in legs with walking   '[]'$ Pain in legs at rest  '[]'$ History of DVT   '[]'$ Phlebitis   '[]'$ Swelling in legs   '[]'$ Varicose veins   '[]'$ Non-healing ulcers Pulmonary:   '[]'$ Uses home oxygen   '[]'$ Productive cough   '[]'$ Hemoptysis   '[]'$ Wheeze  '[]'$ COPD   '[]'$ Asthma Neurologic:  '[]'$ Dizziness   '[]'$ Seizures   '[]'$ History of stroke   '[]'$ History of TIA  '[]'$ Aphasia   '[]'$ Vissual changes   '[]'$ Weakness or numbness in arm   '[]'$ Weakness or numbness in leg Musculoskeletal:   '[]'$ Joint swelling   '[]'$ Joint pain   '[]'$ Low back pain Hematologic:  '[]'$ Easy bruising  '[]'$ Easy bleeding   '[]'$ Hypercoagulable state   '[]'$ Anemic Gastrointestinal:  '[]'$ Diarrhea   '[]'$ Vomiting  '[]'$ Gastroesophageal reflux/heartburn   '[]'$ Difficulty swallowing. Genitourinary:  '[]'$ Chronic kidney disease   '[]'$ Difficult urination  '[]'$ Frequent urination   '[]'$ Blood in urine Skin:  '[]'$ Rashes   '[]'$ Ulcers  Psychological:  '[]'$ History of anxiety   '[]'$  History of major  depression.  Physical Examination  Vitals:   06/12/21 1123  BP: 135/71  Pulse: 71  Resp: 16  Weight: 252 lb 3.2 oz (114.4 kg)  Height: 6' (1.829 m)   Body mass index is 34.2 kg/m. Gen: WD/WN, NAD Head: [place]/AT, No temporalis wasting.  Ear/Nose/Throat: Hearing grossly intact, nares w/o erythema or drainage Eyes: PER, EOMI, sclera nonicteric.  Neck: Supple, no masses.  No bruit or JVD.  Pulmonary:  Good air movement, no audible  wheezing, no use of accessory muscles.  Cardiac: RRR, normal S1, S2, no Murmurs. Vascular:  no carotid bruit Vessel Right Left  Radial Palpable Palpable  Carotid Palpable Palpable  Gastrointestinal: soft, non-distended. No guarding/no peritoneal signs.  Musculoskeletal: M/S 5/5 throughout.  No visible deformity.  Neurologic: CN 2-12 intact. Pain and light touch intact in extremities.  Symmetrical.  Speech is fluent. Motor exam as listed above. Psychiatric: Judgment intact, Mood & affect appropriate for pt's clinical situation. Dermatologic: No rashes or ulcers noted.  No changes consistent with cellulitis.   CBC Lab Results  Component Value Date   WBC 6.1 06/02/2021   HGB 13.2 06/02/2021   HCT 39.0 06/02/2021   MCV 87.5 06/02/2021   PLT 144.0 (L) 06/02/2021    BMET    Component Value Date/Time   NA 142 06/02/2021 1030   NA 136 01/16/2013 0616   K 4.6 06/02/2021 1030   K 3.5 01/16/2013 0616   CL 106 06/02/2021 1030   CL 105 01/16/2013 0616   CO2 30 06/02/2021 1030   CO2 26 01/16/2013 0616   GLUCOSE 111 (H) 06/02/2021 1030   GLUCOSE 126 (H) 01/16/2013 0616   BUN 17 06/02/2021 1030   BUN 10 01/16/2013 0616   CREATININE 0.95 06/02/2021 1030   CREATININE 0.69 01/16/2013 0616   CALCIUM 9.4 06/02/2021 1030   CALCIUM 9.0 01/16/2013 0616   GFRNONAA >60 05/30/2021 1652   GFRNONAA >60 01/16/2013 0616   GFRAA >60 01/16/2013 0616   Estimated Creatinine Clearance: 89.1 mL/min (by C-G formula based on SCr of 0.95 mg/dL).  COAG Lab Results   Component Value Date   INR 1.0 05/30/2021   INR 1.0 11/07/2020   INR 1.0 12/24/2012    Radiology CT Head Wo Contrast  Result Date: 05/30/2021 CLINICAL DATA:  Diplopia starting at 8:30 today.  Recent headache. EXAM: CT HEAD WITHOUT CONTRAST TECHNIQUE: Contiguous axial images were obtained from the base of the skull through the vertex without intravenous contrast. RADIATION DOSE REDUCTION: This exam was performed according to the departmental dose-optimization program which includes automated exposure control, adjustment of the mA and/or kV according to patient size and/or use of iterative reconstruction technique. COMPARISON:  11/18/2020 and MRI brain from 11/11/2020 FINDINGS: Brain: Chronic mild encephalomalacia along the medial anterior frontal lobes bilaterally, unchanged. Otherwise, the brainstem, cerebellum, cerebral peduncles, thalamus, basal ganglia, basilar cisterns, and ventricular system appear within normal limits. No intracranial hemorrhage, mass lesion, or acute CVA. Vascular: There is atherosclerotic calcification of the cavernous carotid arteries bilaterally. Skull: Unremarkable Sinuses/Orbits: Unremarkable Other: Unremarkable IMPRESSION: 1. No acute intracranial findings. 2. 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 3. Atherosclerosis. Electronically Signed   By: Van Clines M.D.   On: 05/30/2021 17:32   MR ANGIO HEAD WO CONTRAST  Result Date: 05/30/2021 CLINICAL DATA:  Diplopia.  Rule out stroke EXAM: MRI HEAD WITHOUT AND WITH CONTRAST MRA HEAD WITHOUT CONTRAST MRA NECK WITHOUT AND WITH CONTRAST TECHNIQUE: Multiplanar, multiecho pulse sequences of the brain and surrounding structures were obtained without and with intravenous contrast. Angiographic images of the Circle of Willis were obtained using MRA technique without intravenous contrast. Angiographic images of the neck were obtained using MRA technique without and with  intravenous contrast. Carotid stenosis measurements (when applicable) are obtained utilizing NASCET criteria, using the distal internal carotid diameter as the denominator. CONTRAST:  17m GADAVIST GADOBUTROL 1 MMOL/ML IV SOLN COMPARISON:  CT head 05/30/2021 FINDINGS: MRI HEAD FINDINGS Brain: Negative for acute infarct. Mild  white matter changes consistent with chronic microvascular ischemia. Brainstem and cerebellum intact. Negative for hemorrhage, mass, or fluid collection. Ventricle size and cerebral volume normal for age. Normal enhancement of the brain postcontrast administration. Vascular: Loss of flow void left internal carotid artery through the skull base and cavernous segment compatible with occlusion. Otherwise normal arterial flow voids. Skull and upper cervical spine: No focal skeletal lesion. Sinuses/Orbits: Mild mucosal edema paranasal sinuses. Bilateral cataract extraction Other: None MRA HEAD FINDINGS Minimal flow left internal carotid artery. Small amount of flow related signal in the left internal carotid artery in the neck and cavernous carotid. This is similar to prior CTA of 11/18/2020. Right internal carotid artery widely patent. Anterior and middle cerebral arteries patent bilaterally without stenosis or large vessel occlusion. Negative for aneurysm. Internal carotid artery patent bilaterally. Right PICA patent. Left PICA not visualized. Left AICA patent. Basilar widely patent. Superior cerebellar and posterior cerebral arteries patent. Fetal origin left posterior cerebral artery. Negative for aneurysm. MRA NECK FINDINGS Normal aortic arch. Innominate artery widely patent. Left subclavian artery widely patent Moderate stenosis at the origin of the left common carotid artery. Left common carotid artery is a small vessel with slow flow. This vessel appears to occlude near the carotid bifurcation however there could be a small branch patent. Left internal carotid artery may be occluded or have  slow flow. This is difficult to determine due to enhancing collateral vessels in the neck. Both vertebral arteries are patent without stenosis in the neck. IMPRESSION: 1. Negative for acute infarct. Mild chronic microvascular ischemic change in the white matter. 2. 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 Possible occlusion of the distal left common carotid artery. Possible occlusion versus minimal flow left internal carotid artery. The MRA head suggests there is a small amount of flow in the left internal carotid artery through the skull base. This is similar to the prior CT angiogram 11/18/2020. Electronically Signed   By: Franchot Gallo M.D.   On: 05/30/2021 19:48   MR Angiogram Neck W or Wo Contrast  Result Date: 05/30/2021 CLINICAL DATA:  Diplopia.  Rule out stroke EXAM: MRI HEAD WITHOUT AND WITH CONTRAST MRA HEAD WITHOUT CONTRAST MRA NECK WITHOUT AND WITH CONTRAST TECHNIQUE: Multiplanar, multiecho pulse sequences of the brain and surrounding structures were obtained without and with intravenous contrast. Angiographic images of the Circle of Willis were obtained using MRA technique without intravenous contrast. Angiographic images of the neck were obtained using MRA technique without and with intravenous contrast. Carotid stenosis measurements (when applicable) are obtained utilizing NASCET criteria, using the distal internal carotid diameter as the denominator. CONTRAST:  85m GADAVIST GADOBUTROL 1 MMOL/ML IV SOLN COMPARISON:  CT head 05/30/2021 FINDINGS: MRI HEAD FINDINGS Brain: Negative for acute infarct. Mild white matter changes consistent with chronic microvascular ischemia. Brainstem and cerebellum intact. Negative for hemorrhage, mass, or fluid collection. Ventricle size and cerebral volume normal for age. Normal enhancement of the brain postcontrast administration. Vascular: Loss of flow void left internal carotid artery through the skull base and cavernous segment compatible  with occlusion. Otherwise normal arterial flow voids. Skull and upper cervical spine: No focal skeletal lesion. Sinuses/Orbits: Mild mucosal edema paranasal sinuses. Bilateral cataract extraction Other: None MRA HEAD FINDINGS Minimal flow left internal carotid artery. Small amount of flow related signal in the left internal carotid artery in the neck and cavernous carotid. This is similar to prior CTA of 11/18/2020. Right internal carotid artery widely patent. Anterior and middle cerebral arteries patent  bilaterally without stenosis or large vessel occlusion. Negative for aneurysm. Internal carotid artery patent bilaterally. Right PICA patent. Left PICA not visualized. Left AICA patent. Basilar widely patent. Superior cerebellar and posterior cerebral arteries patent. Fetal origin left posterior cerebral artery. Negative for aneurysm. MRA NECK FINDINGS Normal aortic arch. Innominate artery widely patent. Left subclavian artery widely patent Moderate stenosis at the origin of the left common carotid artery. Left common carotid artery is a small vessel with slow flow. This vessel appears to occlude near the carotid bifurcation however there could be a small branch patent. Left internal carotid artery may be occluded or have slow flow. This is difficult to determine due to enhancing collateral vessels in the neck. Both vertebral arteries are patent without stenosis in the neck. IMPRESSION: 1. Negative for acute infarct. Mild chronic microvascular ischemic change in the white matter. 2. 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 Possible occlusion of the distal left common carotid artery. Possible occlusion versus minimal flow left internal carotid artery. The MRA head suggests there is a small amount of flow in the left internal carotid artery through the skull base. This is similar to the prior CT angiogram 11/18/2020. Electronically Signed   By: Franchot Gallo M.D.   On: 05/30/2021 19:48   MR  BRAIN W WO CONTRAST  Result Date: 05/30/2021 CLINICAL DATA:  [place]iplopia.  Rule out stroke EXAM: MRI HEAD WITHOUT AND WITH CONTRAST MRA HEAD WITHOUT CONTRAST MRA NECK WITHOUT AND WITH CONTRAST TECHNIQUE: Multiplanar, multiecho pulse sequences of the brain and surrounding structures were obtained without and with intravenous contrast. Angiographic images of the Circle of Willis were obtained using MRA technique without intravenous contrast. Angiographic images of the neck were obtained using MRA technique without and with intravenous contrast. Carotid stenosis measurements (when applicable) are obtained utilizing NASCET criteria, using the distal internal carotid diameter as the denominator. CONTRAST:  85m GADAVIST GADOBUTROL 1 MMOL/ML IV SOLN COMPARISON:  CT head 05/30/2021 FINDINGS: MRI HEAD FINDINGS Brain: Negative for acute infarct. Mild white matter changes consistent with chronic microvascular ischemia. Brainstem and cerebellum intact. Negative for hemorrhage, mass, or fluid collection. Ventricle size and cerebral volume normal for age. Normal enhancement of the brain postcontrast administration. Vascular: Loss of flow void left internal carotid artery through the skull base and cavernous segment compatible with occlusion. Otherwise normal arterial flow voids. Skull and upper cervical spine: No focal skeletal lesion. Sinuses/Orbits: Mild mucosal edema paranasal sinuses. Bilateral cataract extraction Other: None MRA HEAD FINDINGS Minimal flow left internal carotid artery. Small amount of flow related signal in the left internal carotid artery in the neck and cavernous carotid. This is similar to prior CTA of 11/18/2020. Right internal carotid artery widely patent. Anterior and middle cerebral arteries patent bilaterally without stenosis or large vessel occlusion. Negative for aneurysm. Internal carotid artery patent bilaterally. Right PICA patent. Left PICA not visualized. Left AICA patent. Basilar widely  patent. Superior cerebellar and posterior cerebral arteries patent. Fetal origin left posterior cerebral artery. Negative for aneurysm. MRA NECK FINDINGS Normal aortic arch. Innominate artery widely patent. Left subclavian artery widely patent Moderate stenosis at the origin of the left common carotid artery. Left common carotid artery is a small vessel with slow flow. This vessel appears to occlude near the carotid bifurcation however there could be a small branch patent. Left internal carotid artery may be occluded or have slow flow. This is difficult to determine due to enhancing collateral vessels in the neck. Both vertebral arteries are patent  without stenosis in the neck. IMPRESSION: 1. Negative for acute infarct. Mild chronic microvascular ischemic change in the white matter. 2. 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 Possible occlusion of the distal left common carotid artery. Possible occlusion versus minimal flow left internal carotid artery. The MRA head suggests there is a small amount of flow in the left internal carotid artery through the skull base. This is similar to the prior CT angiogram 11/18/2020. Electronically Signed   By: Franchot Gallo M.D.   On: 05/30/2021 19:48   ECHOCARDIOGRAM COMPLETE BUBBLE STUDY  Result Date: 05/31/2021    ECHOCARDIOGRAM REPORT   Patient Name:   GLENWOOD REVOIR Date of Exam: 05/31/2021 Medical Rec #:  [id number]         Height:       72.0 in Accession #:    [id number]        Weight:       258.2 lb Date of Birth:  08-16-1945          BSA:          2.375 m Patient Age:    26 years          BP:           130/76 mmHg Patient Gender: M                 HR:           62 bpm. Exam Location:  Inpatient Procedure: 2D Echo and Saline Contrast Bubble Study Indications:    TIA  History:        Patient has no prior history of Echocardiogram examinations.                 Risk Factors:Hypertension and Dyslipidemia.  Sonographer:    Arlyss Gandy Referring Phys:  [id number] JAN A [place]  1. Left ventricular ejection fraction, by estimation, is 60 to 65%. The left ventricle has normal function. The left ventricle has no regional wall motion abnormalities. Left ventricular diastolic parameters were normal.  2. Right ventricular systolic function is normal. The right ventricular size is normal. There is normal pulmonary artery systolic pressure.  3. The mitral valve is normal in structure. No evidence of mitral valve regurgitation.  4. The aortic valve is normal in structure. Aortic valve regurgitation is not visualized.  5. Agitated saline contrast bubble study was negative, with no evidence of any interatrial shunt. FINDINGS  Left Ventricle: Left ventricular ejection fraction, by estimation, is 60 to 65%. The left ventricle has normal function. The left ventricle has no regional wall motion abnormalities. The left ventricular internal cavity size was normal in size. There is  no left ventricular hypertrophy. Left ventricular diastolic parameters were normal. Right Ventricle: The right ventricular size is normal. Right vetricular wall thickness was not well visualized. Right ventricular systolic function is normal. There is normal pulmonary artery systolic pressure. The tricuspid regurgitant velocity is 2.12 m/s, and with an assumed right atrial pressure of 8 mmHg, the estimated right ventricular systolic pressure is 76.7 mmHg. Left Atrium: Left atrial size was normal in size. Right Atrium: Right atrial size was normal in size. Pericardium: There is no evidence of pericardial effusion. Mitral Valve: The mitral valve is normal in structure. No evidence of mitral valve regurgitation. Tricuspid Valve: The tricuspid valve is grossly normal. Tricuspid valve regurgitation is not demonstrated. Aortic Valve: The aortic valve is normal in structure. Aortic valve regurgitation is not visualized. Aortic valve mean  gradient measures 5.0 mmHg. Aortic valve peak gradient measures  10.5 mmHg. Aortic valve area, by VTI measures 2.58 cm. Pulmonic Valve: The pulmonic valve was normal in structure. Pulmonic valve regurgitation is not visualized. Aorta: The aortic root and ascending aorta are structurally normal, with no evidence of dilitation. IAS/Shunts: No atrial level shunt detected by color flow Doppler. Agitated saline contrast was given intravenously to evaluate for intracardiac shunting. Agitated saline contrast bubble study was negative, with no evidence of any interatrial shunt.  LEFT VENTRICLE PLAX 2D LVIDd:         5.20 cm   Diastology LVIDs:         3.50 cm   LV e' medial:    8.38 cm/s LV PW:         0.90 cm   LV E/e' medial:  10.0 LV IVS:        0.90 cm   LV e' lateral:   8.81 cm/s LVOT diam:     2.00 cm   LV E/e' lateral: 9.5 LV SV:         94 LV SV Index:   40 LVOT Area:     3.14 cm  RIGHT VENTRICLE             IVC RV Basal diam:  4.50 cm     IVC diam: 1.90 cm RV Mid diam:    4.10 cm RV S prime:     11.30 cm/s TAPSE (M-mode): 2.8 cm LEFT ATRIUM             Index        RIGHT ATRIUM           Index LA diam:        3.80 cm 1.60 cm/m   RA Area:     17.60 cm LA Vol (A2C):   70.7 ml 29.77 ml/m  RA Volume:   49.60 ml  20.89 ml/m LA Vol (A4C):   46.0 ml 19.37 ml/m LA Biplane Vol: 58.7 ml 24.72 ml/m  AORTIC VALVE AV Area (Vmax):    2.73 cm AV Area (Vmean):   2.64 cm AV Area (VTI):     2.58 cm AV Vmax:           162.00 cm/s AV Vmean:          106.000 cm/s AV VTI:            0.365 m AV Peak Grad:      10.5 mmHg AV Mean Grad:      5.0 mmHg LVOT Vmax:         141.00 cm/s LVOT Vmean:        89.100 cm/s LVOT VTI:          0.300 m LVOT/AV VTI ratio: 0.82  AORTA Ao Root diam: 3.30 cm Ao Asc diam:  3.00 cm MITRAL VALVE                TRICUSPID VALVE MV Area (PHT): 2.69 cm     TR Peak grad:   18.0 mmHg MV Decel Time: 282 msec     TR Vmax:        212.00 cm/s MV E velocity: 83.40 cm/s MV A velocity: 114.00 cm/s  SHUNTS MV E/A ratio:  0.73         Systemic VTI:  0.30 m  Systemic Diam: 2.00 cm Mertie Moores MD Electronically signed by Mertie Moores MD Signature Date/Time: 05/31/2021/2:21:49 PM    Final      Assessment/Plan 1. Bilateral carotid artery stenosis Recommend:   Given the patient's asymptomatic right ICA stenosis and the occlusion of the LICA no further invasive testing or surgery at this time.   I  have personally reviewed both the CT scan dated November 18, 2020 as well as the MRI dated 11/11/2020 and I believe they are consistent with a occlusion of the ICA.  My read is this is conclusive.  Carotid Duplex done today shows RICA 1-39% and Occlusion of the LICA.  No change compared to last study in 11/14/2020.    Continue antiplatelet therapy as prescribed Continue management of CAD, HTN and Hyperlipidemia Healthy heart diet,  encouraged exercise at least 4 times per week Follow up in 6 months with duplex ultrasound and physical exam    - VAS US CAROTID; Future  2. PAD (peripheral artery disease) (HCC)  Recommend:  The patient has evidence of atherosclerosis of the lower extremities with claudication.  The patient does not voice lifestyle limiting changes at this point in time.  Noninvasive studies do not suggest clinically significant change.  No invasive studies, angiography or surgery at this time The patient should continue walking and begin a more formal exercise program.  The patient should continue antiplatelet therapy and aggressive treatment of the lipid abnormalities  No changes in the patient's medications at this time  The patient should continue wearing graduated compression socks 10-15 mmHg strength to control the mild edema.    3. Essential hypertension Continue antihypertensive medications as already ordered, these medications have been reviewed and there are no changes at this time.   4. COPD with acute exacerbation ([place]) Continue pulmonary medications and aerosols as already ordered, these medications have been  reviewed and there are no changes at this time.    5. Hyperlipidemia, unspecified hyperlipidemia type Continue statin as ordered and reviewed, no changes at this time      Hortencia Pilar, MD  06/12/2021 1:06 PM

## 2021-06-13 ENCOUNTER — Other Ambulatory Visit (HOSPITAL_COMMUNITY): Payer: Self-pay

## 2021-06-13 ENCOUNTER — Telehealth (HOSPITAL_COMMUNITY): Payer: Self-pay | Admitting: Pharmacist

## 2021-06-13 NOTE — Telephone Encounter (Signed)
Pharmacy Transitions of Care Follow-up Telephone Call ? ?Date of discharge: 05/31/21  ?Discharge Diagnosis: Diplopia and stroke ? ?How have you been since you were released from the hospital? Overall well but bruising remains  ? ?Medication changes made at discharge: ? - START: ASA '325mg'$ , Brilinta, Pantoprazole ? - STOPPED: n/a ? - CHANGED: n/a ? ?Medication changes verified by the patient? Yes ?  ? ?Medication Accessibility: ? ?Home Pharmacy: [facility]  ? ?Was the patient provided with refills on discharged medications? Yes, but meds are changing after this fill  ? ?Have all prescriptions been transferred from [facility] to home pharmacy? N/a  ? ?Is the patient able to afford medications? Has insurance ?Notable copays: generic - Pt will switch to plavix ?Eligible patient assistance: n/a ?  ? ?Medication Review: ? ?TICAGRELOR (BRILINTA) ?Ticagrelor 90 mg BID initiated on 05/31/21.  ?- Educated patient on expected duration of therapy of aspirin with ticagrelor of 3 weeks. Pt was instructed by vascular MD to reduce ASA dose to '81mg'$  once he switches to plavix.  ?- Discussed importance of taking medication around the same time every day, ?- Reviewed potential DDIs with patient ?- Advised patient of medications to avoid (NSAIDs, aspirin maintenance doses>100 mg daily) ?- Educated that Tylenol (acetaminophen) will be the preferred analgesic to prevent risk of bleeding  ?- Emphasized importance of monitoring for signs and symptoms of bleeding (abnormal bruising, prolonged bleeding, nose bleeds, bleeding from gums, discolored urine, black tarry stools)  ?- Educated patient to notify doctor if shortness of breath or abnormal heartbeat occur ?- Advised patient to alert all providers of antiplatelet therapy prior to starting a new medication or having a procedure  ? ? ? ?Follow-up Appointments: ? ?PCP Hospital f/u appt confirmed? Has not seen PCP  ? ?[facility] f/u appt confirmed? Saw vascular/vein MD  yesterday on 06/12/21.  ?Scheduled to see neurology on 08/02/21.  ? ?If their condition worsens, is the pt aware to call PCP or go to the Emergency Dept.? yes ? ?Final Patient Assessment: ?Patient is doing well, reports bruising remains but is not getting any worse.  Denies an other bleeding.  Patient reports compliance and demonstrates an understanding of his med regimen/plan.  He will reach out to MD for refills prior to running out of current supply.  ? ?

## 2021-06-20 DIAGNOSIS — H532 Diplopia: Secondary | ICD-10-CM | POA: Diagnosis not present

## 2021-06-22 ENCOUNTER — Other Ambulatory Visit: Payer: Self-pay

## 2021-06-22 ENCOUNTER — Ambulatory Visit (INDEPENDENT_AMBULATORY_CARE_PROVIDER_SITE_OTHER): Payer: Medicare HMO | Admitting: Primary Care

## 2021-06-22 ENCOUNTER — Encounter: Payer: Self-pay | Admitting: Primary Care

## 2021-06-22 VITALS — BP 126/68 | HR 77 | Temp 98.6°F | Ht 72.0 in | Wt 251.0 lb

## 2021-06-22 DIAGNOSIS — F411 Generalized anxiety disorder: Secondary | ICD-10-CM | POA: Diagnosis not present

## 2021-06-22 DIAGNOSIS — K219 Gastro-esophageal reflux disease without esophagitis: Secondary | ICD-10-CM | POA: Diagnosis not present

## 2021-06-22 DIAGNOSIS — I6523 Occlusion and stenosis of bilateral carotid arteries: Secondary | ICD-10-CM

## 2021-06-22 DIAGNOSIS — F3342 Major depressive disorder, recurrent, in full remission: Secondary | ICD-10-CM | POA: Diagnosis not present

## 2021-06-22 DIAGNOSIS — R7303 Prediabetes: Secondary | ICD-10-CM | POA: Diagnosis not present

## 2021-06-22 MED ORDER — SERTRALINE HCL 100 MG PO TABS: 100.0000 mg | ORAL_TABLET | Freq: Every day | ORAL | 0 refills | Status: DC

## 2021-06-22 MED ORDER — PANTOPRAZOLE SODIUM 40 MG PO TBEC: 40.0000 mg | DELAYED_RELEASE_TABLET | Freq: Every day | ORAL | 1 refills | Status: DC

## 2021-06-22 MED ORDER — CLOPIDOGREL BISULFATE 75 MG PO TABS: 75.0000 mg | ORAL_TABLET | Freq: Every day | ORAL | 1 refills | Status: DC

## 2021-06-22 NOTE — Patient Instructions (Signed)
Stop taking Brilinta. ? ?Start clopidogrel (Plavix) 75 mg once daily and aspirin 81 mg once daily for plaque build up in the arteries.  ? ?We increased your dose of sertraline (Zoloft) to 100 mg daily.  Take 1-1/2 tablets of your 50 mg sertraline (75 mg total) for 1 to 2 weeks, then increase to 100 mg daily thereafter.  I sent a new prescription to your pharmacy for sertraline (Zoloft) 100 mg to take once daily. ? ?It was a pleasure to see you today! ? ?

## 2021-06-22 NOTE — Assessment & Plan Note (Signed)
Significant improvement with recent A1c of 5.3!! ?Commended him on his improvements! ?

## 2021-06-22 NOTE — Progress Notes (Signed)
? ?Subjective:  ? ? [name], male    DOB: Sep 11, 1945, 76 y.o.   MRN: [id number] ? ?HPI ? ?Alan [name] is a very pleasant 76 y.o. male with a history of hypertension, PVD, PAD, CVA, COPD, GERD, chronic back pain, chronic knee pain, chronic neck pain, glaucoma, paresthesias of lower extremities, hyperlipidemia, prediabetes who presents today for medication refill and hospital follow up. ? ?1) Hospital Follow Up: He presented to [facility] ED on 05/30/2021 for symptoms of diplopia that began earlier that morning.  He was evaluated by his ophthalmologist who is concerned about skew deviation and was referred to the ED. ? ?He underwent MR angio in August 2022 which suggested occlusion or near occlusion to the left carotid artery.  He was prescribed clopidogrel at that time but did not take as he was wary of the side effects. ? ?During his ED visit he underwent CT head, MR angiogram both of which were negative for acute findings.  Neurology consulted who recommended admission for further stroke work-up. ? ?During his hospital stay he was initiated on [place]. Plan is to continue aspirin and Brilinta x3 weeks, then Brilinta alone.  He underwent 2D echocardiogram with bubble study which showed EF of 60 to 65%, no PFO. A1c improved to 5.3! Symptoms improved so he was discharged home on 05/31/21 with recommendations for PCP follow-up, vascular surgery follow-up, stroke clinic follow-up, repeat CBC/CMP. ? ?Since his hospital stay he has been evaluated by vascular surgery.  He underwent carotid duplex which showed R ICA 1 to 39% and occlusion of the LICA.  No changes since August 2022. No surgery or invasive procedures were recommended.  ? ?Today he endorses no double vision in weeks. He endorses compliance to Brilinta and aspirin, but as noticed quite a bit of bruising to his arms so his vascular surgery doctor is changing him back to Plavix and aspirin 81 mg daily. He agrees to start Plavix and is  comfortable with doing so. He was also initiated on pantoprazole 40 mg.  He has not received prescriptions from the pharmacy yet. ? ? ?BP Readings from Last 3 Encounters:  ?06/22/21 126/68  ?06/12/21 135/71  ?06/02/21 128/62  ? ?Wt Readings from Last 3 Encounters:  ?06/22/21 251 lb (113.9 kg)  ?06/12/21 252 lb 3.2 oz (114.4 kg)  ?06/02/21 254 lb 7 oz (115.4 kg)  ? ?2) Anxiety/Depression: Currently managed on sertraline 50 mg for which he has been taking for years. Chronic symptoms of irritability, getting angry quickly. His girlfriend has asked for better treatment as his symptoms have become worse.  ? ? ? ? ?Review of Systems  ?Eyes:  Negative for visual disturbance.  ?Cardiovascular:  Negative for chest pain.  ?Neurological:  Negative for dizziness and headaches.  ?Hematological:  Bruises/bleeds easily.  ? ?   ? ? ?Past Medical History:  ?Diagnosis Date  ? Allergic rhinitis, cause unspecified   ? Allergy   ? Cancer [facility])   ? Basal cell skin cancer,left ear  ? Carotid artery stenosis   ? left  ? Carpal tunnel syndrome   ? Chronic airway obstruction, not elsewhere classified   ? Esophageal reflux   ? Glaucoma (increased eye pressure)   ? Lipoma of unspecified site   ? Osteoarthrosis, unspecified whether generalized or localized, lower leg   ? Other and unspecified hyperlipidemia   ? Personal history of colonic polyps   ? Personal history of other malignant neoplasm of skin   ? Unspecified essential  hypertension   ? ? ?Social History  ? ?Socioeconomic History  ? Marital status: Divorced  ?  Spouse name: Not on file  ? Number of children: 2  ? Years of education: Not on file  ? Highest education level: Not on file  ?Occupational History  ? 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 Smoking status: Former  ?  Packs/day: 3.00  ?  Years: 25.00  ?  Pack years: 75.00  ?  Types: Cigarettes  ?  Quit date: 11/23/1996  ?  Years since quitting: 24.5  ? Smokeless tobacco: Never  ?Vaping Use  ? Vaping Use: Never  used  ?Substance and Sexual Activity  ? Alcohol use: Not Currently  ?  Alcohol/week: 0.0 standard drinks  ? Drug use: No  ? Sexual activity: Yes  ?  Partners: Female  ?  Comment: monogamous relationship  ?Other Topics Concern  ? Not on file  ?Social History Narrative  ? Regular exercise -- no  ?   ?   ?   ? ?Social Determinants of Health  ? ?Financial Resource Strain: Not on file  ?Food Insecurity: Not on file  ?Transportation Needs: Not on file  ?Physical Activity: Not on file  ?Stress: Not on file  ?Social Connections: Not on file  ?Intimate Partner Violence: Not on file  ? ? ?Past Surgical History:  ?Procedure Laterality Date  ? bone spur  06-2003  ? right thumb  ? CATARACT EXTRACTION    ? KNEE ARTHROSCOPY  09-2007  ? partial medial mesicecotmy, patellar chonfroplasty  ? SHOULDER SURGERY  03-24-2007  ? removal bone spur  ? SHOULDER SURGERY Left 07/2016  ? [facility], Dr. Harmon Pier  ? TOTAL KNEE ARTHROPLASTY    ? 03-2009 [facility]  ? TOTAL KNEE ARTHROPLASTY Left 12/2012  ? [facility]  ? ? ?Family History  ?Problem Relation Age of Onset  ? Diabetes Mother   ? Hyperlipidemia Mother   ? Hypertension Mother   ? Heart attack Mother   ? Obesity Mother   ? Hyperlipidemia Father   ? Hypertension Father   ? Lung cancer Father   ? Colon polyps Father   ? Hyperlipidemia Brother   ? Hypertension Brother   ? Hyperlipidemia Brother   ? Hypertension Brother   ? Hypertension Sister   ? Hyperlipidemia Sister   ? Lung cancer Sister   ? Hyperlipidemia Sister   ? Drug abuse Daughter   ? Colon cancer Neg Hx   ? Esophageal cancer Neg Hx   ? Pancreatic cancer Neg Hx   ? Rectal cancer Neg Hx   ? ? ?No Known Allergies ? ?Current Outpatient Medications on File Prior to Visit  ?Medication Sig Dispense Refill  ? amLODipine (NORVASC) 10 MG tablet TAKE 1 TABLET BY MOUTH EVERY DAY FOR BLOOD PRESSURE (Patient taking differently: Take 10 mg by mouth daily. TAKE 1 TABLET BY MOUTH EVERY DAY for blood pressure.) 90 tablet 2  ? atorvastatin  (LIPITOR) 80 MG tablet TAKE 1 TABLET BY MOUTH EVERY DAY FOR CHOLESTEROL (Patient taking differently: Take 80 mg by mouth daily. TAKE 1 TABLET BY MOUTH EVERY DAY for cholesterol.) 90 tablet 2  ? fish oil-omega-3 fatty acids 1000 MG capsule Take 2 g by mouth daily.    ? ketoconazole (NIZORAL) 2 % shampoo SMARTSIG:Milliliter(s) Topical    ? losartan (COZAAR) 100 MG tablet TAKE 1 TABLET BY MOUTH EVERY DAY FOR BLOOD PRESSURE (Patient taking differently: Take 100 mg by mouth daily. TAKE  1 TABLET BY MOUTH EVERY DAY FOR BLOOD PRESSURE) 90 tablet 3  ? montelukast (SINGULAIR) 10 MG tablet TAKE 1 TABLET (10 MG TOTAL) BY MOUTH AT BEDTIME. FOR ALLERGIES 90 tablet 0  ? Multiple Vitamin (MULTIVITAMIN) tablet Take 1 tablet by mouth daily.    ? pantoprazole (PROTONIX) 40 MG tablet Take 1 tablet (40 mg total) by mouth daily. 30 tablet 2  ? sertraline (ZOLOFT) 50 MG tablet TAKE 1 TABLET BY MOUTH EVERY DAY FOR DEPRESSION (Patient taking differently: Take 50 mg by mouth daily. TAKE 1 TABLET BY MOUTH EVERY DAY for depression) 90 tablet 2  ? ticagrelor (BRILINTA) 90 MG TABS tablet Take 1 tablet (90 mg total) by mouth 2 (two) times daily. 60 tablet 3  ? ?Current Facility-Administered Medications on File Prior to Visit  ?Medication Dose Route Frequency Provider Last Rate Last Admin  ? 0.9 %  sodium chloride infusion  500 mL Intravenous Once Pyrtle, Lajuan Lines, MD      ? triamcinolone acetonide (KENALOG) 10 MG/ML injection 10 mg  10 mg Other Once Wallene Huh, DPM      ? ? ?BP 126/68   Pulse 77   Temp 98.6 ?F (37 ?C) (Oral)   Ht 6' (1.829 m)   Wt 251 lb (113.9 kg)   SpO2 97%   BMI 34.04 kg/m?  ?Objective:  ? Physical Exam ?Cardiovascular:  ?   Rate and Rhythm: Normal rate and regular rhythm.  ?Pulmonary:  ?   Effort: Pulmonary effort is normal.  ?   Breath sounds: Normal breath sounds. No wheezing or rales.  ?Musculoskeletal:  ?   Cervical back: Neck supple.  ?Skin: ?   General: Skin is warm and dry.  ?Neurological:  ?   Mental Status:  He is alert and oriented to person, place, and time.  ? ? ? ? ? ?   ?Assessment & Plan:  ? ? ? ? ?This visit occurred during the SARS-CoV-2 public health emergency.  Safety protocols were in place, including screening que

## 2021-06-22 NOTE — Assessment & Plan Note (Signed)
Continue pantoprazole 40 mg daily. ?Refills sent to pharmacy. ?

## 2021-06-22 NOTE — Assessment & Plan Note (Signed)
Uncontrolled. ? ?Increase Zoloft to 100 mg once daily.  New prescription sent to pharmacy. ? ?He will start by taking 75 mg daily for 1 to 2 weeks, then increase to 100 mg thereafter.  We discussed these instructions in detail today. ? ?He will update if no improvement. ?

## 2021-06-22 NOTE — Assessment & Plan Note (Signed)
Reviewed hospital notes, labs, imaging from March 2023.  Reviewed office notes from vascular surgery from last week. ? ?Start clopidogrel 75 mg daily, aspirin 81 mg daily. ? ?Discontinue Brilinta and aspirin 325 mg. ? ?Following with vascular surgery. ?

## 2021-06-26 ENCOUNTER — Other Ambulatory Visit: Payer: Self-pay | Admitting: Primary Care

## 2021-06-26 DIAGNOSIS — J3089 Other allergic rhinitis: Secondary | ICD-10-CM

## 2021-07-20 DIAGNOSIS — M5416 Radiculopathy, lumbar region: Secondary | ICD-10-CM | POA: Diagnosis not present

## 2021-08-02 ENCOUNTER — Encounter: Payer: Self-pay | Admitting: Neurology

## 2021-08-02 ENCOUNTER — Ambulatory Visit: Payer: Medicare HMO | Admitting: Neurology

## 2021-08-02 VITALS — BP 120/69 | HR 74 | Ht 72.0 in | Wt 253.0 lb

## 2021-08-02 DIAGNOSIS — H538 Other visual disturbances: Secondary | ICD-10-CM

## 2021-08-02 DIAGNOSIS — H532 Diplopia: Secondary | ICD-10-CM | POA: Diagnosis not present

## 2021-08-02 DIAGNOSIS — Z9189 Other specified personal risk factors, not elsewhere classified: Secondary | ICD-10-CM

## 2021-08-02 DIAGNOSIS — I6522 Occlusion and stenosis of left carotid artery: Secondary | ICD-10-CM

## 2021-08-02 NOTE — Progress Notes (Signed)
?[facility] ?[address] ?[place]. [place] [address] ?[phone] ? ?     OFFICE CONSULT NOTE ? ?Mr. Alan [name] ?Date of Birth:  1945-09-20 ?Medical Record Number:  [id number]  ? ?Referring MD:  Royanne Foots ? ?Reason for Referral:  Diplopia ? ?HPI: Alan [name] is a 76 year old pleasant Caucasian male seen today for initial office consultation visit for diplopia.  History is obtained from the patient and review of electronic medical records and opossum reviewed pertinent available imaging films in PACS.  He has a past medical history of COPD, gastroesophageal reflux disease, glaucoma and essential hypertension.  Patient woke up on 05/30/2021 with sudden onset of diplopia.  He describes this as being binocular with 2 images 1 on top of the other.  He was seen by ophthalmologist who thought he had skew eye deviation.  Diplopia lasted about a week or so and then improved.  He denied accompanying symptoms in the form of headache, blurred vision, vertigo, extremity weakness, numbness gait or balance problems.  Patient had an MRI scan of the brain on 05/30/2021 which showed no acute abnormality with only mild changes of age-related chronic small vessel disease.  MR angiogram of the brain and neck showed no flow in the left ICA suggestive of chronic occlusion.  This was subsequently confirmed on carotid ultrasound on 06/12/2021.  LDL cholesterol was 75 mg percent and hemoglobin A1c 5.3.  Patient has had no recurrent stroke or TIA symptoms and denies any known prior history of stroke however upon inquiry states that about 6 months ago he had an episode of blurred vision and diminished vision in the left eye which lasted about this less than a day and improved.  He was seen by ophthalmologist at that time and saw plaque in his eye.  Interestingly he was not referred for emergent neurovascular work-up it seems like.  Review of electronic medical records show that on 11/18/2020 had CT angiogram of the  neck which showed near occlusion of the left common l carotid artery in the neck.  He has been followed in the vascular surgery office by Dr. Hortencia Pilar and treated conservatively.  He is on Plavix now and tolerating it well with without bleeding but only minor bruising.  His blood pressure is in good control today it is 120/69.  He does admit to snoring but and has not been evaluated for sleep apnea ? ?ROS:   ?14 system review of systems is positive for blurred vision, double vision all other systems negative ? ?PMH:  ?Past Medical History:  ?Diagnosis Date  ? Allergic rhinitis, cause unspecified   ? Allergy   ? Cancer [facility])   ? Basal cell skin cancer,left ear  ? Carotid artery stenosis   ? left  ? Carpal tunnel syndrome   ? Chronic airway obstruction, not elsewhere classified   ? Esophageal reflux   ? Glaucoma (increased eye pressure)   ? Lipoma of unspecified site   ? Osteoarthrosis, unspecified whether generalized or localized, lower leg   ? Other and unspecified hyperlipidemia   ? Personal history of colonic polyps   ? Personal history of other malignant neoplasm of skin   ? Unspecified essential hypertension   ? ? ?Social History:  ?Social History  ? ?Socioeconomic History  ? Marital status: Divorced  ?  Spouse name: Not on file  ? Number of children: 2  ? Years of education: Not on file  ? Highest education level: Not on file  ?Occupational  History  ? 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 Smoking status: Former  ?  Packs/day: 3.00  ?  Years: 25.00  ?  Pack years: 75.00  ?  Types: Cigarettes  ?  Quit date: 11/23/1996  ?  Years since quitting: 24.7  ? Smokeless tobacco: Never  ?Vaping Use  ? Vaping Use: Never used  ?Substance and Sexual Activity  ? Alcohol use: Not Currently  ?  Alcohol/week: 0.0 standard drinks  ? Drug use: No  ? Sexual activity: Yes  ?  Partners: Female  ?  Comment: monogamous relationship  ?Other Topics Concern  ? Not on file  ?Social History Narrative  ? Regular  exercise -- no  ?   ?   ?   ? ?Social Determinants of Health  ? ?Financial Resource Strain: Not on file  ?Food Insecurity: Not on file  ?Transportation Needs: Not on file  ?Physical Activity: Not on file  ?Stress: Not on file  ?Social Connections: Not on file  ?Intimate Partner Violence: Not on file  ? ? ?Medications:   ?Current Outpatient Medications on File Prior to Visit  ?Medication Sig Dispense Refill  ? amLODipine (NORVASC) 10 MG tablet TAKE 1 TABLET BY MOUTH EVERY DAY FOR BLOOD PRESSURE (Patient taking differently: Take 10 mg by mouth daily. TAKE 1 TABLET BY MOUTH EVERY DAY for blood pressure.) 90 tablet 2  ? atorvastatin (LIPITOR) 80 MG tablet TAKE 1 TABLET BY MOUTH EVERY DAY FOR CHOLESTEROL (Patient taking differently: Take 80 mg by mouth daily. TAKE 1 TABLET BY MOUTH EVERY DAY for cholesterol.) 90 tablet 2  ? clopidogrel (PLAVIX) 75 MG tablet Take 1 tablet (75 mg total) by mouth daily. 90 tablet 1  ? fish oil-omega-3 fatty acids 1000 MG capsule Take 2 g by mouth daily.    ? ketoconazole (NIZORAL) 2 % shampoo SMARTSIG:Milliliter(s) Topical    ? losartan (COZAAR) 100 MG tablet TAKE 1 TABLET BY MOUTH EVERY DAY FOR BLOOD PRESSURE (Patient taking differently: Take 100 mg by mouth daily. TAKE 1 TABLET BY MOUTH EVERY DAY FOR BLOOD PRESSURE) 90 tablet 3  ? montelukast (SINGULAIR) 10 MG tablet TAKE 1 TABLET (10 MG TOTAL) BY MOUTH AT BEDTIME FOR ALLERGIES 90 tablet 0  ? Multiple Vitamin (MULTIVITAMIN) tablet Take 1 tablet by mouth daily.    ? pantoprazole (PROTONIX) 40 MG tablet Take 1 tablet (40 mg total) by mouth daily. For heartburn. 90 tablet 1  ? sertraline (ZOLOFT) 100 MG tablet Take 1 tablet (100 mg total) by mouth daily. For anxiety and depression. 90 tablet 0  ? ?Current Facility-Administered Medications on File Prior to Visit  ?Medication Dose Route Frequency Provider Last Rate Last Admin  ? 0.9 %  sodium chloride infusion  500 mL Intravenous Once Pyrtle, Lajuan Lines, MD      ? triamcinolone acetonide (KENALOG)  10 MG/ML injection 10 mg  10 mg Other Once Wallene Huh, DPM      ? ? ?Allergies:  No Known Allergies ? ?Physical Exam ?General: Mildly obese elderly Caucasian male, seated, in no evident distress ?Head: head normocephalic and atraumatic.   ?Neck: supple with no carotid or supraclavicular bruits ?Cardiovascular: regular rate and rhythm, no murmurs ?Musculoskeletal: no deformity ?Skin:  no rash/petichiae ?Vascular:  Normal pulses all extremities ? ?Neurologic Exam ?Mental Status: Awake and fully alert. Oriented to place and time. Recent and remote memory intact. Attention span, concentration and fund of knowledge appropriate. Mood and affect appropriate.  ?Cranial Nerves: Fundoscopic exam  reveals sharp disc margins. Pupils equal, briskly reactive to light. Extraocular movements full without nystagmus. Visual fields full to confrontation. Hearing intact. Facial sensation intact. Face, tongue, palate moves normally and symmetrically.  ?Motor: Normal bulk and tone. Normal strength in all tested extremity muscles. ?Sensory.: intact to touch , pinprick , position and vibratory sensation.  ?Coordination: Rapid alternating movements normal in all extremities. Finger-to-nose and heel-to-shin performed accurately bilaterally. ?Gait and Station: Arises from chair without difficulty. Stance is normal. Gait demonstrates normal stride length and balance . Able to heel, toe and tandem walk with moderate difficulty.  ?Reflexes: 1+ and symmetric. Toes downgoing.  ? ?NIHSS  0 ?Modified Rankin  0 ? ? ?ASSESSMENT: 76 year old Caucasian male with transient episode of binocular vertical diplopia in February 2023 of unclear etiology.  Possibly microvascular 4th nerve palsy or MRI negative small brainstem infarct.  He has known chronic left ICA occlusion which is fortunately been asymptomatic.  Vascular risk factors of hypertension, hyperlipidemia, carotid occlusion, mild obesity and at risk for sleep apnea ? ? ? ? ?PLAN:I had a long  d/w patient about his recent episode of transient diplopia which is of unclear significance but has resolved, left carotid occlusion, risk for recurrent stroke/TIAs, personally independently reviewed imag

## 2021-08-02 NOTE — Patient Instructions (Signed)
I had a long d/w patient about his recent episode of transient diplopia which is of unclear significance but has resolved, left carotid occlusion, risk for recurrent stroke/TIAs, personally independently reviewed imaging studies and stroke evaluation results and answered questions.Continue Plavix 75 mg daily for secondary stroke prevention and maintain strict control of hypertension with blood pressure goal below 130/90, diabetes with hemoglobin A1c goal below 6.5% and lipids with LDL cholesterol goal below 70 mg/dL. I also advised the patient to eat a healthy diet with plenty of whole grains, cereals, fruits and vegetables, exercise regularly and maintain ideal body weight recommend check polysomnogram as he appears to be at risk for obstructive sleep apnea.  Followup in the future with me in 6 months or call earlier if necessary. ?Stroke Prevention ?Some medical conditions and behaviors can lead to a higher chance of having a stroke. You can help prevent a stroke by eating healthy, exercising, not smoking, and managing any medical conditions you have. ?Stroke is a leading cause of functional impairment. Primary prevention is particularly important because a majority of strokes are first-time events. Stroke changes the lives of not only those who experience a stroke but also their family and other caregivers. ?How can this condition affect me? ?A stroke is a medical emergency and should be treated right away. A stroke can lead to brain damage and can sometimes be life-threatening. If a person gets medical treatment right away, there is a better chance of surviving and recovering from a stroke. ?What can increase my risk? ?The following medical conditions may increase your risk of a stroke: ?Cardiovascular disease. ?High blood pressure (hypertension). ?Diabetes. ?High cholesterol. ?Sickle cell disease. ?Blood clotting disorders (hypercoagulable state). ?Obesity. ?Sleep disorders (obstructive sleep apnea). ?Other  risk factors include: ?Being older than age 7. ?Having a history of blood clots, stroke, or mini-stroke (transient ischemic attack, TIA). ?Genetic factors, such as race, ethnicity, or a family history of stroke. ?Smoking cigarettes or using other tobacco products. ?Taking birth control pills, especially if you also use tobacco. ?Heavy use of alcohol or drugs, especially cocaine and methamphetamine. ?Physical inactivity. ?What actions can I take to prevent this? ?Manage your health conditions ?High cholesterol levels. ?Eating a healthy diet is important for preventing high cholesterol. If cholesterol cannot be managed through diet alone, you may need to take medicines. ?Take any prescribed medicines to control your cholesterol as told by your health care provider. ?Hypertension. ?To reduce your risk of stroke, try to keep your blood pressure below 130/80. ?Eating a healthy diet and exercising regularly are important for controlling blood pressure. If these steps are not enough to manage your blood pressure, you may need to take medicines. ?Take any prescribed medicines to control hypertension as told by your health care provider. ?Ask your health care provider if you should monitor your blood pressure at home. ?Have your blood pressure checked every year, even if your blood pressure is normal. Blood pressure increases with age and some medical conditions. ?Diabetes. ?Eating a healthy diet and exercising regularly are important parts of managing your blood sugar (glucose). If your blood sugar cannot be managed through diet and exercise, you may need to take medicines. ?Take any prescribed medicines to control your diabetes as told by your health care provider. ?Get evaluated for obstructive sleep apnea. Talk to your health care provider about getting a sleep evaluation if you snore a lot or have excessive sleepiness. ?Make sure that any other medical conditions you have, such as atrial fibrillation or  atherosclerosis, are managed. ?Nutrition ?Follow instructions from your health care provider about what to eat or drink to help manage your health condition. These instructions may include: ?Reducing your daily calorie intake. ?Limiting how much salt (sodium) you use to 1,500 milligrams (mg) each day. ?Using only healthy fats for cooking, such as olive oil, canola oil, or sunflower oil. ?Eating healthy foods. You can do this by: ?Choosing foods that are high in fiber, such as whole grains, and fresh fruits and vegetables. ?Eating at least 5 servings of fruits and vegetables a day. Try to fill one-half of your plate with fruits and vegetables at each meal. ?Choosing lean protein foods, such as lean cuts of meat, poultry without skin, fish, tofu, beans, and nuts. ?Eating low-fat dairy products. ?Avoiding foods that are high in sodium. This can help lower blood pressure. ?Avoiding foods that have saturated fat, trans fat, and cholesterol. This can help prevent high cholesterol. ?Avoiding processed and prepared foods. ?Counting your daily carbohydrate intake. ? ?Lifestyle ?If you drink alcohol: ?Limit how much you have to: ?0-1 drink a day for women who are not pregnant. ?0-2 drinks a day for men. ?Know how much alcohol is in your drink. In the [place] one drink equals one 12 oz bottle of beer (368m), one 5 oz glass of wine (1419m, or one 1? oz glass of hard liquor (4435m ?Do not use any products that contain nicotine or tobacco. These products include cigarettes, chewing tobacco, and vaping devices, such as e-cigarettes. If you need help quitting, ask your health care provider. ?Avoid secondhand smoke. ?Do not use drugs. ?Activity ? ?Try to stay at a healthy weight. ?Get at least 30 minutes of exercise on most days, such as: ?Fast walking. ?Biking. ?Swimming. ?Medicines ?Take over-the-counter and prescription medicines only as told by your health care provider. Aspirin or blood thinners (antiplatelets or  anticoagulants) may be recommended to reduce your risk of forming blood clots that can lead to stroke. ?Avoid taking birth control pills. Talk to your health care provider about the risks of taking birth control pills if: ?You are over 35 51ars old. ?You smoke. ?You get very bad headaches. ?You have had a blood clot. ?Where to find more information ?American Stroke Association: www.strokeassociation.org ?Get help right away if: ?You or a loved one has any symptoms of a stroke. "BE FAST" is an easy way to remember the main warning signs of a stroke: ?B - Balance. Signs are dizziness, sudden trouble walking, or loss of balance. ?E - Eyes. Signs are trouble seeing or a sudden change in vision. ?F - Face. Signs are sudden weakness or numbness of the face, or the face or eyelid drooping on one side. ?A - Arms. Signs are weakness or numbness in an arm. This happens suddenly and usually on one side of the body. ?S - Speech. Signs are sudden trouble speaking, slurred speech, or trouble understanding what people say. ?T - Time. Time to call emergency services. Write down what time symptoms started. ?You or a loved one has other signs of a stroke, such as: ?A sudden, severe headache with no known cause. ?Nausea or vomiting. ?Seizure. ?These symptoms may represent a serious problem that is an emergency. Do not wait to see if the symptoms will go away. Get medical help right away. Call your local emergency services (911 in the [place] Do not drive yourself to the hospital. ?Summary ?You can help to prevent a stroke by eating healthy, exercising, not smoking, limiting alcohol intake,  and managing any medical conditions you may have. ?Do not use any products that contain nicotine or tobacco. These include cigarettes, chewing tobacco, and vaping devices, such as e-cigarettes. If you need help quitting, ask your health care provider. ?Remember "BE FAST" for warning signs of a stroke. Get help right away if you or a loved one has any  of these signs. ?This information is not intended to replace advice given to you by your health care provider. Make sure you discuss any questions you have with your health care provider. ?Document Revised: 10/19/2019 Document Rev

## 2021-08-08 ENCOUNTER — Ambulatory Visit (INDEPENDENT_AMBULATORY_CARE_PROVIDER_SITE_OTHER): Payer: Medicare HMO

## 2021-08-08 VITALS — Wt 253.0 lb

## 2021-08-08 DIAGNOSIS — Z0000 Encounter for general adult medical examination without abnormal findings: Secondary | ICD-10-CM | POA: Diagnosis not present

## 2021-08-08 NOTE — Progress Notes (Signed)
?Virtual Visit via Telephone Note ? ?I connected with  Alan [name] on 08/08/21 at  8:45 AM EDT by telephone and verified that I am speaking with the correct person using two identifiers. ? ?Location: ?Patient: home ?Provider: [facility] ?Persons participating in the virtual visit: patient/Nurse Health Advisor ?  ?I discussed the limitations, risks, security and privacy concerns of performing an evaluation and management service by telephone and the availability of in person appointments. The patient expressed understanding and agreed to proceed. ? ?Interactive audio and video telecommunications were attempted between this nurse and patient, however failed, due to patient having technical difficulties OR patient did not have access to video capability.  We continued and completed visit with audio only. ? ?Some vital signs may be absent or patient reported.  ? ?Alan David, LPN ? ?Subjective:  ? Alan [name] is a 76 y.o. male who presents for Medicare Annual/Subsequent preventive examination. ? ?Review of Systems    ? ?  ? ?   ?Objective:  ?  ?There were no vitals filed for this visit. ?There is no height or weight on file to calculate BMI. ? ? ?  11/07/2020  ? 12:09 PM 07/10/2019  ?  2:00 PM 10/18/2017  ? 10:48 AM 10/12/2016  ?  9:53 AM  ?Advanced Directives  ?Does Patient Have a Medical Advance Directive? No No No No  ?Does patient want to make changes to medical advance directive?    Yes (MAU/Ambulatory/Procedural Areas - Information given)  ?Would patient like information on creating a medical advance directive? No - Patient declined No - Patient declined No - Patient declined   ? ? ?Current Medications (verified) ?Outpatient Encounter Medications as of 08/08/2021  ?Medication Sig  ? amLODipine (NORVASC) 10 MG tablet TAKE 1 TABLET BY MOUTH EVERY DAY FOR BLOOD PRESSURE (Patient taking differently: Take 10 mg by mouth daily. TAKE 1 TABLET BY MOUTH EVERY DAY for blood pressure.)  ? atorvastatin (LIPITOR)  80 MG tablet TAKE 1 TABLET BY MOUTH EVERY DAY FOR CHOLESTEROL (Patient taking differently: Take 80 mg by mouth daily. TAKE 1 TABLET BY MOUTH EVERY DAY for cholesterol.)  ? clopidogrel (PLAVIX) 75 MG tablet Take 1 tablet (75 mg total) by mouth daily.  ? fish oil-omega-3 fatty acids 1000 MG capsule Take 2 g by mouth daily.  ? ketoconazole (NIZORAL) 2 % shampoo SMARTSIG:Milliliter(s) Topical  ? losartan (COZAAR) 100 MG tablet TAKE 1 TABLET BY MOUTH EVERY DAY FOR BLOOD PRESSURE (Patient taking differently: Take 100 mg by mouth daily. TAKE 1 TABLET BY MOUTH EVERY DAY FOR BLOOD PRESSURE)  ? montelukast (SINGULAIR) 10 MG tablet TAKE 1 TABLET (10 MG TOTAL) BY MOUTH AT BEDTIME FOR ALLERGIES  ? Multiple Vitamin (MULTIVITAMIN) tablet Take 1 tablet by mouth daily.  ? pantoprazole (PROTONIX) 40 MG tablet Take 1 tablet (40 mg total) by mouth daily. For heartburn.  ? sertraline (ZOLOFT) 100 MG tablet Take 1 tablet (100 mg total) by mouth daily. For anxiety and depression.  ? ?Facility-Administered Encounter Medications as of 08/08/2021  ?Medication  ? 0.9 %  sodium chloride infusion  ? triamcinolone acetonide (KENALOG) 10 MG/ML injection 10 mg  ? ? ?Allergies (verified) ?Patient has no known allergies.  ? ?History: ?Past Medical History:  ?Diagnosis Date  ? Allergic rhinitis, cause unspecified   ? Allergy   ? Cancer [facility])   ? Basal cell skin cancer,left ear  ? Carotid artery stenosis   ? left  ? Carpal tunnel syndrome   ?  Chronic airway obstruction, not elsewhere classified   ? Esophageal reflux   ? Glaucoma (increased eye pressure)   ? Lipoma of unspecified site   ? Osteoarthrosis, unspecified whether generalized or localized, lower leg   ? Other and unspecified hyperlipidemia   ? Personal history of colonic polyps   ? Personal history of other malignant neoplasm of skin   ? Unspecified essential hypertension   ? ?Past Surgical History:  ?Procedure Laterality Date  ? bone spur  06-2003  ? right thumb  ? CATARACT EXTRACTION    ? KNEE  ARTHROSCOPY  09-2007  ? partial medial mesicecotmy, patellar chonfroplasty  ? SHOULDER SURGERY  03-24-2007  ? removal bone spur  ? SHOULDER SURGERY Left 07/2016  ? [facility], Dr. Harmon Pier  ? TOTAL KNEE ARTHROPLASTY    ? 03-2009 [facility]  ? TOTAL KNEE ARTHROPLASTY Left 12/2012  ? [facility]  ? ?Family History  ?Problem Relation Age of Onset  ? Diabetes Mother   ? Hyperlipidemia Mother   ? Hypertension Mother   ? Heart attack Mother   ? Obesity Mother   ? Hyperlipidemia Father   ? Hypertension Father   ? Lung cancer Father   ? Colon polyps Father   ? Hyperlipidemia Brother   ? Hypertension Brother   ? Hyperlipidemia Brother   ? Hypertension Brother   ? Hypertension Sister   ? Hyperlipidemia Sister   ? Lung cancer Sister   ? Hyperlipidemia Sister   ? Drug abuse Daughter   ? Colon cancer Neg Hx   ? Esophageal cancer Neg Hx   ? Pancreatic cancer Neg Hx   ? Rectal cancer Neg Hx   ? ?Social History  ? ?Socioeconomic History  ? Marital status: Divorced  ?  Spouse name: Not on file  ? Number of children: 2  ? Years of education: Not on file  ? Highest education level: Not on file  ?Occupational History  ? 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 Smoking status: Former  ?  Packs/day: 3.00  ?  Years: 25.00  ?  Pack years: 75.00  ?  Types: Cigarettes  ?  Quit date: 11/23/1996  ?  Years since quitting: 24.7  ? Smokeless tobacco: Never  ?Vaping Use  ? Vaping Use: Never used  ?Substance and Sexual Activity  ? Alcohol use: Not Currently  ?  Alcohol/week: 0.0 standard drinks  ? Drug use: No  ? Sexual activity: Yes  ?  Partners: Female  ?  Comment: monogamous relationship  ?Other Topics Concern  ? Not on file  ?Social History Narrative  ? Regular exercise -- no  ?   ?   ?   ? ?Social Determinants of Health  ? ?Financial Resource Strain: Not on file  ?Food Insecurity: Not on file  ?Transportation Needs: Not on file  ?Physical Activity: Not on file  ?Stress: Not on file  ?Social Connections: Not on file   ? ? ?Tobacco Counseling ?Counseling given: Not Answered ? ? ?Clinical Intake: ? ?Pre-visit preparation completed: Yes ? ?Pain : No/denies pain ? ?  ? ?Nutritional Risks: None ?Diabetes: No ? ?How often do you need to have someone help you when you read instructions, pamphlets, or other written materials from your doctor or pharmacy?: 1 - Never ? ?Diabetic?no ? ?Interpreter Needed?: No ? ?Information entered by :: Kirke Shaggy, LPN ? ? ?Activities of Daily Living ? ?  08/31/2020  ?  9:51 AM  ?In your present state  of health, do you have any difficulty performing the following activities:  ?Hearing? 0  ?Vision? 0  ?Difficulty concentrating or making decisions? 0  ?Walking or climbing stairs? 0  ?Dressing or bathing? 0  ?Doing errands, shopping? 0  ? ? ?Patient Care Team: ?Pleas Koch, NP as PCP - General (Nurse Practitioner) ?Warden Fillers, MD as Consulting Physician (Ophthalmology) ?Hyatt, Max T, DPM as Veterinary surgeon) ?Marchia Bond, MD as Consulting Physician (Orthopedic Surgery) ?Gerda Diss, DO as Consulting Physician (Sports Medicine) ?Iran Planas, MD as Consulting Physician (Orthopedic Surgery) ?[name], [name], MD (Vascular Surgery) ? ?Indicate any recent Medical Services you may have received from other than [facility] providers in the past year (date may be approximate). ? ?   ?Assessment:  ? This is a routine wellness examination for Lewi. ? ?Hearing/Vision screen ?No results found. ? ?Dietary issues and exercise activities discussed: ?  ? ? Goals Addressed   ?None ?  ? ?Depression Screen ? ?  06/22/2021  ? 11:04 AM 10/21/2020  ?  8:27 AM 08/31/2020  ?  9:50 AM 07/10/2019  ?  2:02 PM 06/05/2018  ?  5:53 PM 05/14/2018  ?  3:14 PM 05/08/2018  ? 10:46 AM  ?PHQ 2/9 Scores  ?PHQ - 2 Score 0 0 0 0     ?PHQ- 9 Score 0 0 0 0     ?  ? Information is confidential and restricted. Go to Review Flowsheets to unlock data.  ?  ?Fall Risk ? ?  08/31/2020  ?  9:51 AM 07/10/2019  ?  2:01 PM 10/18/2017  ?  10:31 AM 10/12/2016  ?  9:52 AM 05/03/2014  ?  2:55 PM  ?Fall Risk   ?Falls in the past year? 0 0 No No No  ?Number falls in past yr:  0     ?Injury with Fall?  0     ?Risk for fall due to :  Medication si

## 2021-08-08 NOTE — Patient Instructions (Signed)
Mr. Helsley , ?Thank you for taking time to come for your Medicare Wellness Visit. I appreciate your ongoing commitment to your health goals. Please review the following plan we discussed and let me know if I can assist you in the future.  ? ?Screening recommendations/referrals: ?Colonoscopy: aged out  ?Recommended yearly ophthalmology/optometry visit for glaucoma screening and checkup ?Recommended yearly dental visit for hygiene and checkup ? ?Vaccinations: ?Influenza vaccine: 12/02/20 ?Pneumococcal vaccine: 05/03/14 ?Tdap vaccine: 10/15/16 ?Shingles vaccine: Zostavax 09/03/06  Shingrix 02/05/20, 05/03/20   ?Covid-19: 04/27/19, 05/18/19, 01/21/20, 08/08/20, 01/17/21 ? ?Advanced directives: no ? ?Conditions/risks identified: none ? ?Next appointment: Follow up in one year for your annual wellness visit. 08/10/22 @ 8:45am by phone ? ?Preventive Care 75 Years and Older, Male ?Preventive care refers to lifestyle choices and visits with your health care provider that can promote health and wellness. ?What does preventive care include? ?A yearly physical exam. This is also called an annual well check. ?Dental exams once or twice a year. ?Routine eye exams. Ask your health care provider how often you should have your eyes checked. ?Personal lifestyle choices, including: ?Daily care of your teeth and gums. ?Regular physical activity. ?Eating a healthy diet. ?Avoiding tobacco and drug use. ?Limiting alcohol use. ?Practicing safe sex. ?Taking low doses of aspirin every day. ?Taking vitamin and mineral supplements as recommended by your health care provider. ?What happens during an annual well check? ?The services and screenings done by your health care provider during your annual well check will depend on your age, overall health, lifestyle risk factors, and family history of disease. ?Counseling  ?Your health care provider may ask you questions about your: ?Alcohol use. ?Tobacco use. ?Drug use. ?Emotional well-being. ?Home and  relationship well-being. ?Sexual activity. ?Eating habits. ?History of falls. ?Memory and ability to understand (cognition). ?Work and work Statistician. ?Screening  ?You may have the following tests or measurements: ?Height, weight, and BMI. ?Blood pressure. ?Lipid and cholesterol levels. These may be checked every 5 years, or more frequently if you are over 67 years old. ?Skin check. ?Lung cancer screening. You may have this screening every year starting at age 48 if you have a 30-pack-year history of smoking and currently smoke or have quit within the past 15 years. ?Fecal occult blood test (FOBT) of the stool. You may have this test every year starting at age 34. ?Flexible sigmoidoscopy or colonoscopy. You may have a sigmoidoscopy every 5 years or a colonoscopy every 10 years starting at age 41. ?Prostate cancer screening. Recommendations will vary depending on your family history and other risks. ?Hepatitis C blood test. ?Hepatitis B blood test. ?Sexually transmitted disease (STD) testing. ?Diabetes screening. This is done by checking your blood sugar (glucose) after you have not eaten for a while (fasting). You may have this done every 1-3 years. ?Abdominal aortic aneurysm (AAA) screening. You may need this if you are a current or former smoker. ?Osteoporosis. You may be screened starting at age 22 if you are at high risk. ?Talk with your health care provider about your test results, treatment options, and if necessary, the need for more tests. ?Vaccines  ?Your health care provider may recommend certain vaccines, such as: ?Influenza vaccine. This is recommended every year. ?Tetanus, diphtheria, and acellular pertussis (Tdap, Td) vaccine. You may need a Td booster every 10 years. ?Zoster vaccine. You may need this after age 4. ?Pneumococcal 13-valent conjugate (PCV13) vaccine. One dose is recommended after age 20. ?Pneumococcal polysaccharide (PPSV23) vaccine. One dose is recommended  after age 1. ?Talk to your  health care provider about which screenings and vaccines you need and how often you need them. ?This information is not intended to replace advice given to you by your health care provider. Make sure you discuss any questions you have with your health care provider. ?Document Released: 04/15/2015 Document Revised: 12/07/2015 Document Reviewed: 01/18/2015 ?Elsevier Interactive Patient Education ? 2017 [employer]. ? ?Fall Prevention in the Home ?Falls can cause injuries. They can happen to people of all ages. There are many things you can do to make your home safe and to help prevent falls. ?What can I do on the outside of my home? ?Regularly fix the edges of walkways and driveways and fix any cracks. ?Remove anything that might make you trip as you walk through a door, such as a raised step or threshold. ?Trim any bushes or trees on the path to your home. ?Use bright outdoor lighting. ?Clear any walking paths of anything that might make someone trip, such as rocks or tools. ?Regularly check to see if handrails are loose or broken. Make sure that both sides of any steps have handrails. ?Any raised decks and porches should have guardrails on the edges. ?Have any leaves, snow, or ice cleared regularly. ?Use sand or salt on walking paths during winter. ?Clean up any spills in your garage right away. This includes oil or grease spills. ?What can I do in the bathroom? ?Use night lights. ?Install grab bars by the toilet and in the tub and shower. Do not use towel bars as grab bars. ?Use non-skid mats or decals in the tub or shower. ?If you need to sit down in the shower, use a plastic, non-slip stool. ?Keep the floor dry. Clean up any water that spills on the floor as soon as it happens. ?Remove soap buildup in the tub or shower regularly. ?Attach bath mats securely with double-sided non-slip rug tape. ?Do not have throw rugs and other things on the floor that can make you trip. ?What can I do in the bedroom? ?Use night  lights. ?Make sure that you have a light by your bed that is easy to reach. ?Do not use any sheets or blankets that are too big for your bed. They should not hang down onto the floor. ?Have a firm chair that has side arms. You can use this for support while you get dressed. ?Do not have throw rugs and other things on the floor that can make you trip. ?What can I do in the kitchen? ?Clean up any spills right away. ?Avoid walking on wet floors. ?Keep items that you use a lot in easy-to-reach places. ?If you need to reach something above you, use a strong step stool that has a grab bar. ?Keep electrical cords out of the way. ?Do not use floor polish or wax that makes floors slippery. If you must use wax, use non-skid floor wax. ?Do not have throw rugs and other things on the floor that can make you trip. ?What can I do with my stairs? ?Do not leave any items on the stairs. ?Make sure that there are handrails on both sides of the stairs and use them. Fix handrails that are broken or loose. Make sure that handrails are as long as the stairways. ?Check any carpeting to make sure that it is firmly attached to the stairs. Fix any carpet that is loose or worn. ?Avoid having throw rugs at the top or bottom of the stairs. If you  do have throw rugs, attach them to the floor with carpet tape. ?Make sure that you have a light switch at the top of the stairs and the bottom of the stairs. If you do not have them, ask someone to add them for you. ?What else can I do to help prevent falls? ?Wear shoes that: ?Do not have high heels. ?Have rubber bottoms. ?Are comfortable and fit you well. ?Are closed at the toe. Do not wear sandals. ?If you use a stepladder: ?Make sure that it is fully opened. Do not climb a closed stepladder. ?Make sure that both sides of the stepladder are locked into place. ?Ask someone to hold it for you, if possible. ?Clearly mark and make sure that you can see: ?Any grab bars or handrails. ?First and last  steps. ?Where the edge of each step is. ?Use tools that help you move around (mobility aids) if they are needed. These include: ?Canes. ?Walkers. ?Scooters. ?Crutches. ?Turn on the lights when you go into a dark area.

## 2021-09-14 ENCOUNTER — Other Ambulatory Visit: Payer: Self-pay | Admitting: Primary Care

## 2021-09-14 DIAGNOSIS — F411 Generalized anxiety disorder: Secondary | ICD-10-CM

## 2021-09-14 DIAGNOSIS — F3342 Major depressive disorder, recurrent, in full remission: Secondary | ICD-10-CM

## 2021-09-20 ENCOUNTER — Encounter: Payer: Self-pay | Admitting: Internal Medicine

## 2021-09-20 ENCOUNTER — Ambulatory Visit (INDEPENDENT_AMBULATORY_CARE_PROVIDER_SITE_OTHER): Payer: Medicare HMO | Admitting: Internal Medicine

## 2021-09-20 DIAGNOSIS — A084 Viral intestinal infection, unspecified: Secondary | ICD-10-CM | POA: Diagnosis not present

## 2021-09-20 HISTORY — DX: Viral intestinal infection, unspecified: A08.4

## 2021-09-20 NOTE — Progress Notes (Signed)
Subjective:    [name], male    DOB: [birth year], 76 y.o.   MRN: [id number]  HPI Here due to dark stools  Had diarrhea ---but now it is very black Started almost a week ago---was "wide open" Improved but then worsened again Started kaopectate 3-4 days ago Now back to formed stools but very dark Going twice a day now  No abdominal pain---just gas (which is better) No fever Current Outpatient Medications on File Prior to Visit  Medication Sig Dispense Refill   amLODipine (NORVASC) 10 MG tablet TAKE 1 TABLET BY MOUTH EVERY DAY FOR BLOOD PRESSURE (Patient taking differently: Take 10 mg by mouth daily. TAKE 1 TABLET BY MOUTH EVERY DAY for blood pressure.) 90 tablet 2   atorvastatin (LIPITOR) 80 MG tablet TAKE 1 TABLET BY MOUTH EVERY DAY FOR CHOLESTEROL (Patient taking differently: Take 80 mg by mouth daily. TAKE 1 TABLET BY MOUTH EVERY DAY for cholesterol.) 90 tablet 2   clopidogrel (PLAVIX) 75 MG tablet Take 1 tablet (75 mg total) by mouth daily. 90 tablet 1   fish oil-omega-3 fatty acids 1000 MG capsule Take 2 g by mouth daily.     ketoconazole (NIZORAL) 2 % shampoo SMARTSIG:Milliliter(s) Topical     losartan (COZAAR) 100 MG tablet TAKE 1 TABLET BY MOUTH EVERY DAY FOR BLOOD PRESSURE (Patient taking differently: Take 100 mg by mouth daily. TAKE 1 TABLET BY MOUTH EVERY DAY FOR BLOOD PRESSURE) 90 tablet 3   montelukast (SINGULAIR) 10 MG tablet TAKE 1 TABLET (10 MG TOTAL) BY MOUTH AT BEDTIME FOR ALLERGIES 90 tablet 0   Multiple Vitamin (MULTIVITAMIN) tablet Take 1 tablet by mouth daily.     pantoprazole (PROTONIX) 40 MG tablet Take 1 tablet (40 mg total) by mouth daily. For heartburn. 90 tablet 1   sertraline (ZOLOFT) 100 MG tablet TAKE 1 TABLET (100 MG TOTAL) BY MOUTH DAILY. FOR ANXIETY AND DEPRESSION. 90 tablet 0   No current facility-administered medications on file prior to visit.    No Known Allergies  Past Medical History:  Diagnosis Date   Allergic rhinitis,  cause unspecified    Allergy    Cancer ([place])    Basal cell skin cancer,left ear   Carotid artery stenosis    left   Carpal tunnel syndrome    Chronic airway obstruction, not elsewhere classified    Esophageal reflux    Glaucoma (increased eye pressure)    Lipoma of unspecified site    Osteoarthrosis, unspecified whether generalized or localized, lower leg    Other and unspecified hyperlipidemia    Personal history of colonic polyps    Personal history of other malignant neoplasm of skin    Unspecified essential hypertension     Past Surgical History:  Procedure Laterality Date   bone spur  06-2003   right thumb   CATARACT EXTRACTION     KNEE ARTHROSCOPY  09-2007   partial medial mesicecotmy, patellar chonfroplasty   SHOULDER SURGERY  03-24-2007   removal bone spur   SHOULDER SURGERY Left 07/2016   [facility], Dr. Harmon Pier   TOTAL KNEE ARTHROPLASTY     03-2009 [facility]   TOTAL KNEE ARTHROPLASTY Left 12/2012   [facility]    Family History  Problem Relation Age of Onset   Diabetes Mother    Hyperlipidemia Mother    Hypertension Mother    Heart attack Mother    Obesity Mother    Hyperlipidemia Father    Hypertension Father  Lung cancer Father    Colon polyps Father    Hypertension Sister    Hyperlipidemia Sister    Lung cancer Sister    Hyperlipidemia Sister    Heart attack Brother    Diabetes Brother    Hyperlipidemia Brother    Hypertension Brother    Hyperlipidemia Brother    Hypertension Brother    Drug abuse Daughter    Colon cancer Neg Hx    Esophageal cancer Neg Hx    Pancreatic cancer Neg Hx    Rectal cancer Neg Hx     Social History   Socioeconomic History   Marital status: Divorced    Spouse name: Not on file   Number of children: 2   Years of education: Not on file   Highest education level: Not on file  Occupational History   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  Tobacco Use   Smoking status: Former    Packs/day: 3.00     Years: 25.00    Total pack years: 75.00    Types: Cigarettes    Quit date: 11/23/1996    Years since quitting: 24.8   Smokeless tobacco: Never  Vaping Use   Vaping Use: Never used  Substance and Sexual Activity   Alcohol use: Not Currently    Alcohol/week: 0.0 standard drinks of alcohol   Drug use: No   Sexual activity: Yes    Partners: Female    Comment: monogamous relationship  Other Topics Concern   Not on file  Social History Narrative   Regular exercise -- no            Social Determinants of Health   Financial Resource Strain: Low Risk  (08/08/2021)   Overall Financial Resource Strain (CARDIA)    Difficulty of Paying Living Expenses: Not hard at all  Food Insecurity: No Food Insecurity (08/08/2021)   Hunger Vital Sign    Worried About Running Out of Food in the Last Year: Never true    Ran Out of Food in the Last Year: Never true  Transportation Needs: No Transportation Needs (08/08/2021)   PRAPARE - Hydrologist (Medical): No    Lack of Transportation (Non-Medical): No  Physical Activity: Insufficiently Active (08/08/2021)   Exercise Vital Sign    Days of Exercise per Week: 2 days    Minutes of Exercise per Session: 20 min  Stress: No Stress Concern Present (08/08/2021)   [place]    Feeling of Stress : Not at all  Social Connections: Moderately Isolated (08/08/2021)   Social Connection and Isolation Panel [NHANES]    Frequency of Communication with Friends and Family: Twice a week    Frequency of Social Gatherings with Friends and Family: More than three times a week    Attends Religious Services: More than 4 times per year    Active Member of [employer] or Organizations: No    Attends Archivist Meetings: Never    Marital Status: Divorced  Human resources officer Violence: Not At Risk (08/08/2021)   Humiliation, Afraid, Rape, and Kick questionnaire    Fear of Current or  Ex-Partner: No    Emotionally Abused: No    Physically Abused: No    Sexually Abused: No   Review of Systems Vomited once---no nausea now Lost 7# with illness Eating normal again Lives alone---no ill exposures    Objective:   Physical Exam Constitutional:      Appearance: Normal appearance.  Abdominal:     General: There is no distension.     Palpations: Abdomen is soft.     Tenderness: There is no abdominal tenderness. There is no guarding.  Neurological:     Mental Status: He is alert.            Assessment & Plan:

## 2021-09-20 NOTE — Assessment & Plan Note (Signed)
Had fairly typical illness  Concerned due to black stools Hemoccult negative on stool he brought in----like dark due to the kaopectate Exam normal Reassured---no action needed

## 2021-09-24 ENCOUNTER — Other Ambulatory Visit: Payer: Self-pay | Admitting: Primary Care

## 2021-09-24 DIAGNOSIS — J3089 Other allergic rhinitis: Secondary | ICD-10-CM

## 2021-10-18 DIAGNOSIS — H40013 Open angle with borderline findings, low risk, bilateral: Secondary | ICD-10-CM | POA: Diagnosis not present

## 2021-10-18 DIAGNOSIS — Z961 Presence of intraocular lens: Secondary | ICD-10-CM | POA: Diagnosis not present

## 2021-10-18 DIAGNOSIS — H34212 Partial retinal artery occlusion, left eye: Secondary | ICD-10-CM | POA: Diagnosis not present

## 2021-10-18 DIAGNOSIS — G453 Amaurosis fugax: Secondary | ICD-10-CM | POA: Diagnosis not present

## 2021-10-18 DIAGNOSIS — H43813 Vitreous degeneration, bilateral: Secondary | ICD-10-CM | POA: Diagnosis not present

## 2021-10-23 ENCOUNTER — Ambulatory Visit (INDEPENDENT_AMBULATORY_CARE_PROVIDER_SITE_OTHER): Payer: Medicare HMO | Admitting: Family Medicine

## 2021-10-23 ENCOUNTER — Encounter: Payer: Self-pay | Admitting: Family Medicine

## 2021-10-23 VITALS — BP 118/60 | HR 75 | Temp 98.0°F | Ht 72.0 in | Wt 248.5 lb

## 2021-10-23 DIAGNOSIS — M19042 Primary osteoarthritis, left hand: Secondary | ICD-10-CM | POA: Diagnosis not present

## 2021-10-23 MED ORDER — TRIAMCINOLONE ACETONIDE 40 MG/ML IJ SUSP
20.0000 mg | Freq: Once | INTRAMUSCULAR | Status: AC
  Administered 2021-10-23: 20 mg via INTRA_ARTICULAR

## 2021-10-23 NOTE — Progress Notes (Signed)
    Jaedin Regina T. Camille Dragan, MD, [place] at [facility] [place], [address]  Phone: [phone]  FAX: [phone]  Alan [name] - 76 y.o. male  MRN [id number]  Date of Birth: 1945-04-09  Date: 10/23/2021  PCP: Pleas Koch, NP  Referral: Pleas Koch, NP  Chief Complaint  Patient presents with   Osteoarthritis    Left index finger-wants injection   Subjective:   Alan [name] is a 76 y.o. very pleasant male patient with Body mass index is 33.7 kg/m. who presents with the following:  2nd MCP joint injection  Aspiration/Injection Procedure Note Alan [name] 06/23/1945 Date of procedure: 10/23/2021  Procedure: Small Joint Aspiration / Injection of MCP joint, L 2nd Indications: Pain  Procedure Details Verbal consent was obtained. Risks benefits, and alternatives were discussed. Prepped with Chloraprep and Ethyl Chloride used for anesthesia. Under sterile conditions, patient injected into 2nd MCP joint at joint line inserting perpendicularly with traction placed on finger. Aspiration yields no blood. Decreased pain post injection. No complications. Needle size: 25 gauge Injection: 1/2 cc of Lidocaine 1% and Kenalog 20 mg Medication: 1/2 cc of Kenalog 40 mg (equaling Kenalog 20 mg)   Signed,  Aniken Monestime T. Klani Caridi, MD

## 2021-10-23 NOTE — Patient Instructions (Signed)
Alan [name] at [employer] or [facility] - at [facility] in [place]

## 2021-10-24 ENCOUNTER — Other Ambulatory Visit: Payer: Self-pay | Admitting: Primary Care

## 2021-10-24 DIAGNOSIS — I10 Essential (primary) hypertension: Secondary | ICD-10-CM

## 2021-11-03 ENCOUNTER — Other Ambulatory Visit: Payer: Self-pay | Admitting: Primary Care

## 2021-11-03 DIAGNOSIS — E782 Mixed hyperlipidemia: Secondary | ICD-10-CM

## 2021-11-07 ENCOUNTER — Encounter: Payer: Self-pay | Admitting: Primary Care

## 2021-11-07 ENCOUNTER — Ambulatory Visit (INDEPENDENT_AMBULATORY_CARE_PROVIDER_SITE_OTHER): Payer: Medicare HMO | Admitting: Primary Care

## 2021-11-07 ENCOUNTER — Other Ambulatory Visit: Payer: Self-pay | Admitting: Primary Care

## 2021-11-07 VITALS — BP 120/62 | HR 76 | Temp 98.6°F | Ht 72.0 in | Wt 242.0 lb

## 2021-11-07 DIAGNOSIS — G8929 Other chronic pain: Secondary | ICD-10-CM

## 2021-11-07 DIAGNOSIS — I6523 Occlusion and stenosis of bilateral carotid arteries: Secondary | ICD-10-CM | POA: Diagnosis not present

## 2021-11-07 DIAGNOSIS — F411 Generalized anxiety disorder: Secondary | ICD-10-CM | POA: Diagnosis not present

## 2021-11-07 DIAGNOSIS — F1011 Alcohol abuse, in remission: Secondary | ICD-10-CM

## 2021-11-07 DIAGNOSIS — Z8673 Personal history of transient ischemic attack (TIA), and cerebral infarction without residual deficits: Secondary | ICD-10-CM | POA: Diagnosis not present

## 2021-11-07 DIAGNOSIS — H532 Diplopia: Secondary | ICD-10-CM

## 2021-11-07 DIAGNOSIS — Z125 Encounter for screening for malignant neoplasm of prostate: Secondary | ICD-10-CM

## 2021-11-07 DIAGNOSIS — D696 Thrombocytopenia, unspecified: Secondary | ICD-10-CM

## 2021-11-07 DIAGNOSIS — E785 Hyperlipidemia, unspecified: Secondary | ICD-10-CM | POA: Diagnosis not present

## 2021-11-07 DIAGNOSIS — R519 Headache, unspecified: Secondary | ICD-10-CM | POA: Insufficient documentation

## 2021-11-07 DIAGNOSIS — K219 Gastro-esophageal reflux disease without esophagitis: Secondary | ICD-10-CM | POA: Diagnosis not present

## 2021-11-07 DIAGNOSIS — F32A Depression, unspecified: Secondary | ICD-10-CM | POA: Diagnosis not present

## 2021-11-07 DIAGNOSIS — M545 Low back pain, unspecified: Secondary | ICD-10-CM | POA: Diagnosis not present

## 2021-11-07 DIAGNOSIS — Z0001 Encounter for general adult medical examination with abnormal findings: Secondary | ICD-10-CM | POA: Diagnosis not present

## 2021-11-07 DIAGNOSIS — Z86010 Personal history of colonic polyps: Secondary | ICD-10-CM

## 2021-11-07 DIAGNOSIS — R7303 Prediabetes: Secondary | ICD-10-CM

## 2021-11-07 DIAGNOSIS — I10 Essential (primary) hypertension: Secondary | ICD-10-CM

## 2021-11-07 LAB — COMPREHENSIVE METABOLIC PANEL
ALT: 14 U/L (ref 0–53)
AST: 11 U/L (ref 0–37)
Albumin: 4.3 g/dL (ref 3.5–5.2)
Alkaline Phosphatase: 61 U/L (ref 39–117)
BUN: 20 mg/dL (ref 6–23)
CO2: 29 mEq/L (ref 19–32)
Calcium: 9.5 mg/dL (ref 8.4–10.5)
Chloride: 104 mEq/L (ref 96–112)
Creatinine, Ser: 0.92 mg/dL (ref 0.40–1.50)
GFR: 81.47 mL/min (ref >60.00)
Glucose, Bld: 110 mg/dL — ABNORMAL HIGH (ref 70–99)
Potassium: 4.4 mEq/L (ref 3.5–5.1)
Sodium: 141 mEq/L (ref 135–145)
Total Bilirubin: 0.4 mg/dL (ref 0.2–1.2)
Total Protein: 7 g/dL (ref 6.0–8.3)

## 2021-11-07 LAB — LIPID PANEL
Cholesterol: 148 mg/dL (ref 0–200)
HDL: 54 mg/dL (ref >39.00)
LDL Cholesterol: 74 mg/dL (ref 0–99)
NonHDL: 93.81
Total CHOL/HDL Ratio: 3
Triglycerides: 100 mg/dL (ref 0.0–149.0)
VLDL: 20 mg/dL (ref 0.0–40.0)

## 2021-11-07 LAB — HEMOGLOBIN A1C: Hgb A1c MFr Bld: 6.2 % (ref 4.6–6.5)

## 2021-11-07 LAB — CBC
HCT: 40.3 % (ref 39.0–52.0)
Hemoglobin: 13.4 g/dL (ref 13.0–17.0)
MCHC: 33.2 g/dL (ref 30.0–36.0)
MCV: 88.8 fl (ref 78.0–100.0)
Platelets: 135 10*3/uL — ABNORMAL LOW (ref 150.0–400.0)
RBC: 4.53 Mil/uL (ref 4.22–5.81)
RDW: 13.4 % (ref 11.5–15.5)
WBC: 5.7 10*3/uL (ref 4.0–10.5)

## 2021-11-07 LAB — PSA, MEDICARE: PSA: 2.38 ng/ml (ref 0.10–4.00)

## 2021-11-07 LAB — TSH: TSH: 2.25 u[IU]/mL (ref 0.35–5.50)

## 2021-11-07 LAB — SEDIMENTATION RATE: Sed Rate: 9 mm/hr (ref 0–20)

## 2021-11-07 NOTE — Assessment & Plan Note (Signed)
Following with neurology, office notes from March 2023 reviewed. Continue clopidogrel 75 mg daily, lipid control, glucose control, BP control.  Do not suspect headaches are secondary to CVA, but I did recommend he reach out to his neurologist.

## 2021-11-07 NOTE — Assessment & Plan Note (Signed)
Chronic, occurring more frequently.  Stroke screening negative. No rash or evidence of herpes zoster. Checking labs today to rule out GCA.  Evaluated by ophthalmology already.  Reviewed MRI's from February 2023. Recommended he reach out to his neurologist.

## 2021-11-07 NOTE — Assessment & Plan Note (Signed)
Discussed the importance of a healthy diet and regular exercise in order for weight loss, and to reduce the risk of further co-morbidity. ? ?Repeat A1C pending. ?

## 2021-11-07 NOTE — Assessment & Plan Note (Signed)
In remission. Commended him on this!

## 2021-11-07 NOTE — Progress Notes (Signed)
Subjective:    [name], male    DOB: 1945/09/03, 76 y.o.   MRN: [id number]  Headache  Associated symptoms include photophobia. Pertinent negatives include no coughing, dizziness, numbness or rhinorrhea.    Alan [name] is a very pleasant 76 y.o. male who presents today for complete physical and follow up of chronic conditions.  He would also like to discuss headaches. Chronic over the last three months, difficulty for him to pinpoint, but more frequent over the last three weeks. Experiencing headaches several times weekly. Headaches are located to the left temporal region with radiation behind his left eye. He experienced two headaches yesterday, first headache occurred when waking, improved after eating breakfast. The second occurred after coming from mowing the lawn, riding mower. Improved with cooling off and laying down for 10 minutes. Also with intermittent photophobia and photophobia. History of migraines. He denies chest pain, double vision, shortness of breath, rashes, increased stress. He took Tylenol which helped. He checks BP at home which runs low 100's/60's.   Immunizations: -Tetanus: 2018 -Influenza: Due this season  -Covid-19: 4 vaccines -Shingles: Completed Shingrix and Zostavax -Pneumonia: Prevnar 13 in 2016, Pneumovax in 2013  Diet: [place].  Exercise: No regular exercise.  Eye exam: Completes annually  Dental exam: Completes semi-annually   Colonoscopy: Completed in 2022, no further screening needed per report  PSA: Due   BP Readings from Last 3 Encounters:  11/07/21 120/62  10/23/21 118/60  09/20/21 116/64   Wt Readings from Last 3 Encounters:  11/07/21 242 lb (109.8 kg)  10/23/21 248 lb 8 oz (112.7 kg)  09/20/21 243 lb (110.2 kg)       Review of Systems  Constitutional:  Negative for unexpected weight change.  HENT:  Negative for rhinorrhea.   Eyes:  Positive for photophobia. Negative for visual disturbance.   Respiratory:  Negative for cough and shortness of breath.   Cardiovascular:  Negative for chest pain.  Gastrointestinal:  Negative for constipation and diarrhea.  Genitourinary:  Negative for difficulty urinating.  Musculoskeletal:  Positive for arthralgias.  Skin:  Negative for rash.  Allergic/Immunologic: Negative for environmental allergies.  Neurological:  Positive for headaches. Negative for dizziness and numbness.  Psychiatric/Behavioral:  The patient is not nervous/anxious.          Past Medical History:  Diagnosis Date   Allergic rhinitis, cause unspecified    Allergy    Cancer ([place])    Basal cell skin cancer,left ear   Carotid artery stenosis    left   Carpal tunnel syndrome    Chronic airway obstruction, not elsewhere classified    COPD with acute exacerbation ([place]) 10/02/2007   Qualifier: Diagnosis of  By: [name] MD, Clinton D    Esophageal reflux    Glaucoma (increased eye pressure)    Lipoma of unspecified site    Osteoarthrosis, unspecified whether generalized or localized, lower leg    Other and unspecified hyperlipidemia    Personal history of colonic polyps    Personal history of other malignant neoplasm of skin    Unspecified essential hypertension    Viral gastroenteritis 09/20/2021    Social History   Socioeconomic History   Marital status: Divorced    Spouse name: Not on file   Number of children: 2   Years of education: Not on file   Highest education level: Not on file  Occupational History   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  Tobacco Use   Smoking status: Former  Packs/day: 3.00    Years: 25.00    Total pack years: 75.00    Types: Cigarettes    Quit date: 11/23/1996    Years since quitting: 24.9   Smokeless tobacco: Never  Vaping Use   Vaping Use: Never used  Substance and Sexual Activity   Alcohol use: Not Currently    Alcohol/week: 0.0 standard drinks of alcohol   Drug use: No   Sexual activity: Yes    Partners: Female     Comment: monogamous relationship  Other Topics Concern   Not on file  Social History Narrative   Regular exercise -- no            Social Determinants of Health   Financial Resource Strain: Low Risk  (08/08/2021)   Overall Financial Resource Strain (CARDIA)    Difficulty of Paying Living Expenses: Not hard at all  Food Insecurity: No Food Insecurity (08/08/2021)   Hunger Vital Sign    Worried About Running Out of Food in the Last Year: Never true    Ran Out of Food in the Last Year: Never true  Transportation Needs: No Transportation Needs (08/08/2021)   PRAPARE - Hydrologist (Medical): No    Lack of Transportation (Non-Medical): No  Physical Activity: Insufficiently Active (08/08/2021)   Exercise Vital Sign    Days of Exercise per Week: 2 days    Minutes of Exercise per Session: 20 min  Stress: No Stress Concern Present (08/08/2021)   [place]    Feeling of Stress : Not at all  Social Connections: Moderately Isolated (08/08/2021)   Social Connection and Isolation Panel [NHANES]    Frequency of Communication with Friends and Family: Twice a week    Frequency of Social Gatherings with Friends and Family: More than three times a week    Attends Religious Services: More than 4 times per year    Active Member of [employer] or Organizations: No    Attends Archivist Meetings: Never    Marital Status: Divorced  Human resources officer Violence: Not At Risk (08/08/2021)   Humiliation, Afraid, Rape, and Kick questionnaire    Fear of Current or Ex-Partner: No    Emotionally Abused: No    Physically Abused: No    Sexually Abused: No    Past Surgical History:  Procedure Laterality Date   bone spur  06-2003   right thumb   CATARACT EXTRACTION     KNEE ARTHROSCOPY  09-2007   partial medial mesicecotmy, patellar chonfroplasty   SHOULDER SURGERY  03-24-2007   removal bone spur   SHOULDER SURGERY  Left 07/2016   [place], Dr. Harmon Pier   TOTAL KNEE ARTHROPLASTY     03-2009 [facility]   TOTAL KNEE ARTHROPLASTY Left 12/2012   [facility]    Family History  Problem Relation Age of Onset   Diabetes Mother    Hyperlipidemia Mother    Hypertension Mother    Heart attack Mother    Obesity Mother    Hyperlipidemia Father    Hypertension Father    Lung cancer Father    Colon polyps Father    Hypertension Sister    Hyperlipidemia Sister    Lung cancer Sister    Hyperlipidemia Sister    Heart attack Brother    Diabetes Brother    Hyperlipidemia Brother    Hypertension Brother    Hyperlipidemia Brother    Hypertension Brother  Drug abuse Daughter    Colon cancer Neg Hx    Esophageal cancer Neg Hx    Pancreatic cancer Neg Hx    Rectal cancer Neg Hx     No Known Allergies  Current Outpatient Medications on File Prior to Visit  Medication Sig Dispense Refill   amLODipine (NORVASC) 10 MG tablet TAKE 1 TABLET BY MOUTH EVERY DAY FOR BLOOD PRESSURE 90 tablet 2   atorvastatin (LIPITOR) 80 MG tablet TAKE 1 TABLET BY MOUTH EVERY DAY for cholesterol. Office visit required for further refills. 90 tablet 0   clopidogrel (PLAVIX) 75 MG tablet Take 1 tablet (75 mg total) by mouth daily. 90 tablet 1   fish oil-omega-3 fatty acids 1000 MG capsule Take 2 g by mouth daily.     ketoconazole (NIZORAL) 2 % shampoo SMARTSIG:Milliliter(s) Topical     losartan (COZAAR) 100 MG tablet TAKE 1 TABLET BY MOUTH EVERY DAY FOR BLOOD PRESSURE 90 tablet 0   montelukast (SINGULAIR) 10 MG tablet TAKE 1 TABLET (10 MG TOTAL) BY MOUTH AT BEDTIME FOR ALLERGIES 90 tablet 0   Multiple Vitamin (MULTIVITAMIN) tablet Take 1 tablet by mouth daily.     pantoprazole (PROTONIX) 40 MG tablet Take 1 tablet (40 mg total) by mouth daily. For heartburn. 90 tablet 1   sertraline (ZOLOFT) 100 MG tablet TAKE 1 TABLET (100 MG TOTAL) BY MOUTH DAILY. FOR ANXIETY AND DEPRESSION. 90 tablet 0   No current  facility-administered medications on file prior to visit.    BP 120/62   Pulse 76   Temp 98.6 F (37 C) (Oral)   Ht 6' (1.829 m)   Wt 242 lb (109.8 kg)   SpO2 97%   BMI 32.82 kg/m  Objective:   Physical Exam HENT:     Right Ear: Tympanic membrane and ear canal normal.     Left Ear: Tympanic membrane and ear canal normal.     Nose: Nose normal.     Right Sinus: No maxillary sinus tenderness or frontal sinus tenderness.     Left Sinus: No maxillary sinus tenderness or frontal sinus tenderness.  Eyes:     Conjunctiva/sclera: Conjunctivae normal.  Neck:     Thyroid: No thyromegaly.     Vascular: No carotid bruit.  Cardiovascular:     Rate and Rhythm: Normal rate and regular rhythm.     Heart sounds: Normal heart sounds.  Pulmonary:     Effort: Pulmonary effort is normal.     Breath sounds: Normal breath sounds. No wheezing or rales.  Abdominal:     General: Bowel sounds are normal.     Palpations: Abdomen is soft.     Tenderness: There is no abdominal tenderness.  Musculoskeletal:        General: Normal range of motion.     Cervical back: Neck supple.  Skin:    General: Skin is warm and dry.  Neurological:     Mental Status: He is alert and oriented to person, place, and time.     Cranial Nerves: No cranial nerve deficit.     Deep Tendon Reflexes: Reflexes are normal and symmetric.  Psychiatric:        Mood and Affect: Mood normal.           Assessment & Plan:   Problem List Items Addressed This Visit       Cardiovascular and Mediastinum   Carotid artery stenosis    Following with vascular surgery.  Continue Plavix 75 mg daily.  Continue  atorvastatin 80 mg daily.   Repeat lipid panel pending.         Digestive   GERD    Controlled.  Continue pantoprazole 40 mg daily.        Other   Hyperlipidemia    Repeat lipid panel pending. Continue atorvastatin 80 mg daily.      Relevant Orders   Comprehensive metabolic panel   Lipid panel   GAD  (generalized anxiety disorder)    Controlled.  Continue Zoloft 100 mg daily      Chronic back pain    Stable. No concerns today.  Continue to monitor       Prediabetes    Discussed the importance of a healthy diet and regular exercise in order for weight loss, and to reduce the risk of further co-morbidity.  Repeat A1C pending.      Relevant Orders   Hemoglobin A1c   Depression    Controlled.  Continue Zoloft 100 mg daily.      Encounter for annual general medical examination with abnormal findings in adult - Primary    Immunizations UTD. PSA due and pending. Colonoscopy UTD, no further imaging needed give age.  Discussed the importance of a healthy diet and regular exercise in order for weight loss, and to reduce the risk of further co-morbidity.  Exam stable. Labs pending.  Follow up in 1 year for repeat physical.       History of alcohol abuse    In remission. Commended him on this!      History of colonic polyps    Reviewed colonoscopy from 2022. No further screening needed per report.       Diplopia    Following with ophthalmology. Resolved.  Continue to monitor.       History of CVA (cerebrovascular accident)    Following with neurology, office notes from March 2023 reviewed. Continue clopidogrel 75 mg daily, lipid control, glucose control, BP control.  Do not suspect headaches are secondary to CVA, but I did recommend he reach out to his neurologist.      Frequent headaches    Chronic, occurring more frequently.  Stroke screening negative. No rash or evidence of herpes zoster. Checking labs today to rule out GCA.  Evaluated by ophthalmology already.  Reviewed MRI's from February 2023. Recommended he reach out to his neurologist.       Relevant Orders   Sedimentation rate   CBC   TSH   Other Visit Diagnoses     Screening for prostate cancer       Relevant Orders   PSA, Medicare          Pleas Koch, NP

## 2021-11-07 NOTE — Assessment & Plan Note (Signed)
Controlled.  Continue pantoprazole 40 mg daily. 

## 2021-11-07 NOTE — Assessment & Plan Note (Signed)
Controlled.  Continue amlodipine 10 mg daily and losartan 100 mg daily. CMP pending

## 2021-11-07 NOTE — Assessment & Plan Note (Signed)
Following with ophthalmology. Resolved.  Continue to monitor.

## 2021-11-07 NOTE — Assessment & Plan Note (Signed)
Repeat lipid panel pending.  Continue atorvastatin 80 mg daily.  

## 2021-11-07 NOTE — Assessment & Plan Note (Signed)
Stable.  No concerns today. Continue to monitor.  

## 2021-11-07 NOTE — Patient Instructions (Addendum)
Stop by the lab prior to leaving today. I will notify you of your results once received.   Please contact your neurologist regarding your headaches. I am getting some labs today to rule out other causes.   It was a pleasure to see you today!  Preventive Care 41 Years and Older, Male Preventive care refers to lifestyle choices and visits with your health care provider that can promote health and wellness. Preventive care visits are also called wellness exams. What can I expect for my preventive care visit? Counseling During your preventive care visit, your health care provider may ask about your: Medical history, including: Past medical problems. Family medical history. History of falls. Current health, including: Emotional well-being. Home life and relationship well-being. Sexual activity. Memory and ability to understand (cognition). Lifestyle, including: Alcohol, nicotine or tobacco, and drug use. Access to firearms. Diet, exercise, and sleep habits. Work and work Statistician. Sunscreen use. Safety issues such as seatbelt and bike helmet use. Physical exam Your health care provider will check your: Height and weight. These may be used to calculate your BMI (body mass index). BMI is a measurement that tells if you are at a healthy weight. Waist circumference. This measures the distance around your waistline. This measurement also tells if you are at a healthy weight and may help predict your risk of certain diseases, such as type 2 diabetes and high blood pressure. Heart rate and blood pressure. Body temperature. Skin for abnormal spots. What immunizations do I need?  Vaccines are usually given at various ages, according to a schedule. Your health care provider will recommend vaccines for you based on your age, medical history, and lifestyle or other factors, such as travel or where you work. What tests do I need? Screening Your health care provider may recommend screening  tests for certain conditions. This may include: Lipid and cholesterol levels. Diabetes screening. This is done by checking your blood sugar (glucose) after you have not eaten for a while (fasting). Hepatitis C test. Hepatitis B test. HIV (human immunodeficiency virus) test. STI (sexually transmitted infection) testing, if you are at risk. Lung cancer screening. Colorectal cancer screening. Prostate cancer screening. Abdominal aortic aneurysm (AAA) screening. You may need this if you are a current or former smoker. Talk with your health care provider about your test results, treatment options, and if necessary, the need for more tests. Follow these instructions at home: Eating and drinking  Eat a diet that includes fresh fruits and vegetables, whole grains, lean protein, and low-fat dairy products. Limit your intake of foods with high amounts of sugar, saturated fats, and salt. Take vitamin and mineral supplements as recommended by your health care provider. Do not drink alcohol if your health care provider tells you not to drink. If you drink alcohol: Limit how much you have to 0-2 drinks a day. Know how much alcohol is in your drink. In the [place] one drink equals one 12 oz bottle of beer (355 mL), one 5 oz glass of wine (148 mL), or one 1 oz glass of hard liquor (44 mL). Lifestyle Brush your teeth every morning and night with fluoride toothpaste. Floss one time each day. Exercise for at least 30 minutes 5 or more days each week. Do not use any products that contain nicotine or tobacco. These products include cigarettes, chewing tobacco, and vaping devices, such as e-cigarettes. If you need help quitting, ask your health care provider. Do not use drugs. If you are sexually active, practice safe sex.  Use a condom or other form of protection to prevent STIs. Take aspirin only as told by your health care provider. Make sure that you understand how much to take and what form to take. Work  with your health care provider to find out whether it is safe and beneficial for you to take aspirin daily. Ask your health care provider if you need to take a cholesterol-lowering medicine (statin). Find healthy ways to manage stress, such as: Meditation, yoga, or listening to music. Journaling. Talking to a trusted person. Spending time with friends and family. Safety Always wear your seat belt while driving or riding in a vehicle. Do not drive: If you have been drinking alcohol. Do not ride with someone who has been drinking. When you are tired or distracted. While texting. If you have been using any mind-altering substances or drugs. Wear a helmet and other protective equipment during sports activities. If you have firearms in your house, make sure you follow all gun safety procedures. Minimize exposure to UV radiation to reduce your risk of skin cancer. What's next? Visit your health care provider once a year for an annual wellness visit. Ask your health care provider how often you should have your eyes and teeth checked. Stay up to date on all vaccines. This information is not intended to replace advice given to you by your health care provider. Make sure you discuss any questions you have with your health care provider. Document Revised: 09/14/2020 Document Reviewed: 09/14/2020 Elsevier Patient Education  [place].

## 2021-11-07 NOTE — Assessment & Plan Note (Signed)
Controlled.  Continue Zoloft 100 mg daily

## 2021-11-07 NOTE — Assessment & Plan Note (Signed)
Immunizations UTD. PSA due and pending. Colonoscopy UTD, no further imaging needed give age.  Discussed the importance of a healthy diet and regular exercise in order for weight loss, and to reduce the risk of further co-morbidity.  Exam stable. Labs pending.  Follow up in 1 year for repeat physical.

## 2021-11-07 NOTE — Assessment & Plan Note (Signed)
Reviewed colonoscopy from 2022. No further screening needed per report.

## 2021-11-07 NOTE — Assessment & Plan Note (Signed)
Following with vascular surgery.  Continue Plavix 75 mg daily.  Continue atorvastatin 80 mg daily.   Repeat lipid panel pending.

## 2021-11-07 NOTE — Assessment & Plan Note (Signed)
Controlled.  Continue Zoloft 100 mg daily.

## 2021-11-09 ENCOUNTER — Telehealth: Payer: Self-pay | Admitting: Primary Care

## 2021-11-09 ENCOUNTER — Other Ambulatory Visit: Payer: Self-pay | Admitting: Primary Care

## 2021-11-09 DIAGNOSIS — I10 Essential (primary) hypertension: Secondary | ICD-10-CM

## 2021-11-09 NOTE — Telephone Encounter (Signed)
Patient called in returning a call he received. Thank you!

## 2021-11-10 ENCOUNTER — Other Ambulatory Visit: Payer: Self-pay | Admitting: Primary Care

## 2021-11-10 NOTE — Telephone Encounter (Signed)
See lab results note. Have reviewed with patient.  No further action needed at this time.

## 2021-12-08 DIAGNOSIS — G8929 Other chronic pain: Secondary | ICD-10-CM | POA: Diagnosis not present

## 2021-12-08 DIAGNOSIS — I739 Peripheral vascular disease, unspecified: Secondary | ICD-10-CM | POA: Diagnosis not present

## 2021-12-08 DIAGNOSIS — S8011XA Contusion of right lower leg, initial encounter: Secondary | ICD-10-CM | POA: Diagnosis not present

## 2021-12-08 DIAGNOSIS — S8001XA Contusion of right knee, initial encounter: Secondary | ICD-10-CM | POA: Diagnosis not present

## 2021-12-08 DIAGNOSIS — S93401A Sprain of unspecified ligament of right ankle, initial encounter: Secondary | ICD-10-CM | POA: Diagnosis not present

## 2021-12-08 DIAGNOSIS — Z96651 Presence of right artificial knee joint: Secondary | ICD-10-CM | POA: Diagnosis not present

## 2021-12-08 DIAGNOSIS — M25571 Pain in right ankle and joints of right foot: Secondary | ICD-10-CM | POA: Diagnosis not present

## 2021-12-08 DIAGNOSIS — E669 Obesity, unspecified: Secondary | ICD-10-CM | POA: Diagnosis not present

## 2021-12-08 DIAGNOSIS — M25561 Pain in right knee: Secondary | ICD-10-CM | POA: Diagnosis not present

## 2021-12-13 ENCOUNTER — Other Ambulatory Visit (INDEPENDENT_AMBULATORY_CARE_PROVIDER_SITE_OTHER): Payer: Self-pay | Admitting: Vascular Surgery

## 2021-12-13 DIAGNOSIS — I6523 Occlusion and stenosis of bilateral carotid arteries: Secondary | ICD-10-CM

## 2021-12-15 ENCOUNTER — Other Ambulatory Visit: Payer: Self-pay | Admitting: Primary Care

## 2021-12-15 DIAGNOSIS — F3342 Major depressive disorder, recurrent, in full remission: Secondary | ICD-10-CM

## 2021-12-15 DIAGNOSIS — F411 Generalized anxiety disorder: Secondary | ICD-10-CM

## 2021-12-18 ENCOUNTER — Ambulatory Visit (INDEPENDENT_AMBULATORY_CARE_PROVIDER_SITE_OTHER): Payer: Medicare HMO | Admitting: Vascular Surgery

## 2021-12-18 ENCOUNTER — Ambulatory Visit (INDEPENDENT_AMBULATORY_CARE_PROVIDER_SITE_OTHER): Payer: Medicare HMO

## 2021-12-18 ENCOUNTER — Encounter (INDEPENDENT_AMBULATORY_CARE_PROVIDER_SITE_OTHER): Payer: Self-pay | Admitting: Vascular Surgery

## 2021-12-18 VITALS — BP 120/72 | HR 64 | Resp 17 | Ht 72.0 in | Wt 248.4 lb

## 2021-12-18 DIAGNOSIS — I10 Essential (primary) hypertension: Secondary | ICD-10-CM

## 2021-12-18 DIAGNOSIS — I6523 Occlusion and stenosis of bilateral carotid arteries: Secondary | ICD-10-CM

## 2021-12-18 DIAGNOSIS — E785 Hyperlipidemia, unspecified: Secondary | ICD-10-CM | POA: Diagnosis not present

## 2021-12-18 DIAGNOSIS — K219 Gastro-esophageal reflux disease without esophagitis: Secondary | ICD-10-CM | POA: Diagnosis not present

## 2021-12-18 DIAGNOSIS — I739 Peripheral vascular disease, unspecified: Secondary | ICD-10-CM

## 2021-12-18 NOTE — Progress Notes (Unsigned)
MRN : [id number]  Alan [name] is a 76 y.o. (1945/06/30) male who presents with chief complaint of check carotid arteries.  History of Present Illness:   The patient is seen for follow up evaluation of carotid stenosis. The carotid stenosis followed by ultrasound.   However, he was recently in the hospital for double vision thought to be a CVA.  He r/o for CVA but his left ICA and CCA were again noted to likely be occluded.   The patient denies amaurosis fugax. There is no recent history of TIA symptoms or focal motor deficits. There is no prior documented CVA.   The patient is taking enteric-coated aspirin 81 mg daily.   There is no history of migraine headaches. There is no history of seizures.   The patient has a history of coronary artery disease, no recent episodes of angina or shortness of breath. The patient denies PAD or claudication symptoms. There is a history of hyperlipidemia which is being treated with a statin.     Carotid Duplex done today shows RICA 1-39% and Occlusion of the LICA.  No change compared to last study in 11/14/2020.   No outpatient medications have been marked as taking for the 12/18/21 encounter (Appointment) with Delana Meyer, [name], MD.    Past Medical History:  Diagnosis Date   Allergic rhinitis, cause unspecified    Allergy    Cancer ([place])    Basal cell skin cancer,left ear   Carotid artery stenosis    left   Carpal tunnel syndrome    Chronic airway obstruction, not elsewhere classified    COPD with acute exacerbation ([place]) 10/02/2007   Qualifier: Diagnosis of  By: [name] MD, Clinton D    Esophageal reflux    Glaucoma (increased eye pressure)    Lipoma of unspecified site    Osteoarthrosis, unspecified whether generalized or localized, lower leg    Other and unspecified hyperlipidemia    Personal history of colonic polyps    Personal history of other malignant neoplasm of skin    Unspecified essential hypertension    Viral  gastroenteritis 09/20/2021    Past Surgical History:  Procedure Laterality Date   bone spur  06-2003   right thumb   CATARACT EXTRACTION     KNEE ARTHROSCOPY  09-2007   partial medial mesicecotmy, patellar chonfroplasty   SHOULDER SURGERY  03-24-2007   removal bone spur   SHOULDER SURGERY Left 07/2016   [facility], Dr. Harmon Pier   TOTAL KNEE ARTHROPLASTY     03-2009 [facility]   TOTAL KNEE ARTHROPLASTY Left 12/2012   [facility]    Social History Social History   Tobacco Use   Smoking status: Former    Packs/day: 3.00    Years: 25.00    Total pack years: 75.00    Types: Cigarettes    Quit date: 11/23/1996    Years since quitting: 25.0   Smokeless tobacco: Never  Vaping Use   Vaping Use: Never used  Substance Use Topics   Alcohol use: Not Currently    Alcohol/week: 0.0 standard drinks of alcohol   Drug use: No    Family History Family History  Problem Relation Age of Onset   Diabetes Mother    Hyperlipidemia Mother    Hypertension Mother    Heart attack Mother    Obesity Mother    Hyperlipidemia Father    Hypertension Father    Lung cancer Father    Colon polyps Father  Hypertension Sister    Hyperlipidemia Sister    Lung cancer Sister    Hyperlipidemia Sister    Heart attack Brother    Diabetes Brother    Hyperlipidemia Brother    Hypertension Brother    Hyperlipidemia Brother    Hypertension Brother    Drug abuse Daughter    Colon cancer Neg Hx    Esophageal cancer Neg Hx    Pancreatic cancer Neg Hx    Rectal cancer Neg Hx     No Known Allergies   REVIEW OF SYSTEMS (Negative unless checked)  Constitutional: '[]'$ Weight loss  '[]'$ Fever  '[]'$ Chills Cardiac: '[]'$ Chest pain   '[]'$ Chest pressure   '[]'$ Palpitations   '[]'$ Shortness of breath when laying flat   '[]'$ Shortness of breath with exertion. Vascular:  '[x]'$ Pain in legs with walking   '[]'$ Pain in legs at rest  '[]'$ History of DVT   '[]'$ Phlebitis   '[]'$ Swelling in legs   '[]'$ Varicose veins   '[]'$ Non-healing  ulcers Pulmonary:   '[]'$ Uses home oxygen   '[]'$ Productive cough   '[]'$ Hemoptysis   '[]'$ Wheeze  '[]'$ COPD   '[]'$ Asthma Neurologic:  '[]'$ Dizziness   '[]'$ Seizures   '[]'$ History of stroke   '[]'$ History of TIA  '[]'$ Aphasia   '[]'$ Vissual changes   '[]'$ Weakness or numbness in arm   '[]'$ Weakness or numbness in leg Musculoskeletal:   '[]'$ Joint swelling   '[]'$ Joint pain   '[]'$ Low back pain Hematologic:  '[]'$ Easy bruising  '[]'$ Easy bleeding   '[]'$ Hypercoagulable state   '[]'$ Anemic Gastrointestinal:  '[]'$ Diarrhea   '[]'$ Vomiting  '[x]'$ Gastroesophageal reflux/heartburn   '[]'$ Difficulty swallowing. Genitourinary:  '[]'$ Chronic kidney disease   '[]'$ Difficult urination  '[]'$ Frequent urination   '[]'$ Blood in urine Skin:  '[]'$ Rashes   '[]'$ Ulcers  Psychological:  '[]'$ History of anxiety   '[]'$  History of major depression.  Physical Examination  There were no vitals filed for this visit. There is no height or weight on file to calculate BMI. Gen: WD/WN, NAD Head: [place]/AT, No temporalis wasting.  Ear/Nose/Throat: Hearing grossly intact, nares w/o erythema or drainage Eyes: PER, EOMI, sclera nonicteric.  Neck: Supple, no masses.  No bruit or JVD.  Pulmonary:  Good air movement, no audible wheezing, no use of accessory muscles.  Cardiac: RRR, normal S1, S2, no Murmurs. Vascular:  carotid bruit noted Vessel Right Left  Radial Palpable Palpable  Carotid  Palpable  Palpable  Subclav  Palpable Palpable  Gastrointestinal: soft, non-distended. No guarding/no peritoneal signs.  Musculoskeletal: M/S 5/5 throughout.  No visible deformity.  Neurologic: CN 2-12 intact. Pain and light touch intact in extremities.  Symmetrical.  Speech is fluent. Motor exam as listed above. Psychiatric: Judgment intact, Mood & affect appropriate for pt's clinical situation. Dermatologic: No rashes or ulcers noted.  No changes consistent with cellulitis.   CBC Lab Results  Component Value Date   WBC 5.7 11/07/2021   HGB 13.4 11/07/2021   HCT 40.3 11/07/2021   MCV 88.8 11/07/2021   PLT 135.0 (L)  11/07/2021    BMET    Component Value Date/Time   NA 141 11/07/2021 0858   NA 136 01/16/2013 0616   K 4.4 11/07/2021 0858   K 3.5 01/16/2013 0616   CL 104 11/07/2021 0858   CL 105 01/16/2013 0616   CO2 29 11/07/2021 0858   CO2 26 01/16/2013 0616   GLUCOSE 110 (H) 11/07/2021 0858   GLUCOSE 126 (H) 01/16/2013 0616   BUN 20 11/07/2021 0858   BUN 10 01/16/2013 0616   CREATININE 0.92 11/07/2021 0858   CREATININE 0.69 01/16/2013 0616   CALCIUM 9.5  11/07/2021 0858   CALCIUM 9.0 01/16/2013 0616   GFRNONAA >60 05/30/2021 1652   GFRNONAA >60 01/16/2013 0616   GFRAA >60 01/16/2013 0616   CrCl cannot be calculated (Patient's most recent lab result is older than the maximum 21 days allowed.).  COAG Lab Results  Component Value Date   INR 1.0 05/30/2021   INR 1.0 11/07/2020   INR 1.0 12/24/2012    Radiology No results found.   Assessment/Plan 1. Bilateral carotid artery stenosis *** - VAS US CAROTID    Hortencia Pilar, MD  12/18/2021 8:46 AM

## 2021-12-19 ENCOUNTER — Other Ambulatory Visit: Payer: Self-pay | Admitting: Primary Care

## 2021-12-19 DIAGNOSIS — I6523 Occlusion and stenosis of bilateral carotid arteries: Secondary | ICD-10-CM

## 2021-12-20 ENCOUNTER — Encounter (INDEPENDENT_AMBULATORY_CARE_PROVIDER_SITE_OTHER): Payer: Self-pay | Admitting: Vascular Surgery

## 2021-12-22 ENCOUNTER — Other Ambulatory Visit: Payer: Self-pay | Admitting: Primary Care

## 2021-12-22 DIAGNOSIS — K219 Gastro-esophageal reflux disease without esophagitis: Secondary | ICD-10-CM

## 2022-01-09 ENCOUNTER — Telehealth (INDEPENDENT_AMBULATORY_CARE_PROVIDER_SITE_OTHER): Payer: Self-pay

## 2022-01-09 NOTE — Telephone Encounter (Signed)
Spoke with the patient and he is scheduled with Dr. Delana Meyer for a left carotid angio possible stent on 01/23/22 with a 12:00 pm arrival time to the MM. Pre-procedure instructions were discussed and will be mailed.

## 2022-01-19 ENCOUNTER — Other Ambulatory Visit: Payer: Self-pay | Admitting: Primary Care

## 2022-01-19 DIAGNOSIS — I10 Essential (primary) hypertension: Secondary | ICD-10-CM

## 2022-01-23 ENCOUNTER — Other Ambulatory Visit: Payer: Self-pay

## 2022-01-23 ENCOUNTER — Encounter: Payer: Self-pay | Admitting: Vascular Surgery

## 2022-01-23 ENCOUNTER — Ambulatory Visit
Admission: RE | Admit: 2022-01-23 | Discharge: 2022-01-23 | Disposition: A | Discharge status: Home / Self Care | Payer: Medicare HMO | Attending: Vascular Surgery | Admitting: Vascular Surgery

## 2022-01-23 ENCOUNTER — Encounter
Admission: RE | Disposition: A | Discharge status: Home / Self Care | Payer: Self-pay | Source: Home / Self Care | Attending: Vascular Surgery

## 2022-01-23 DIAGNOSIS — I6522 Occlusion and stenosis of left carotid artery: Secondary | ICD-10-CM | POA: Diagnosis not present

## 2022-01-23 DIAGNOSIS — Q2549 Other congenital malformations of aorta: Secondary | ICD-10-CM | POA: Diagnosis not present

## 2022-01-23 DIAGNOSIS — I70201 Unspecified atherosclerosis of native arteries of extremities, right leg: Secondary | ICD-10-CM | POA: Diagnosis not present

## 2022-01-23 DIAGNOSIS — I6523 Occlusion and stenosis of bilateral carotid arteries: Secondary | ICD-10-CM

## 2022-01-23 DIAGNOSIS — G451 Carotid artery syndrome (hemispheric): Secondary | ICD-10-CM

## 2022-01-23 DIAGNOSIS — Z8673 Personal history of transient ischemic attack (TIA), and cerebral infarction without residual deficits: Secondary | ICD-10-CM | POA: Diagnosis not present

## 2022-01-23 HISTORY — PX: CAROTID ANGIOGRAPHY: CATH118230

## 2022-01-23 LAB — CREATININE, SERUM
Creatinine, Ser: 0.83 mg/dL (ref 0.61–1.24)
GFR, Estimated: >60 mL/min (ref >60)

## 2022-01-23 LAB — BUN: BUN: 17 mg/dL (ref 8–23)

## 2022-01-23 SURGERY — CAROTID ANGIOGRAPHY
Anesthesia: Moderate Sedation | Laterality: Left

## 2022-01-23 MED ORDER — HEPARIN SODIUM (PORCINE) 1000 UNIT/ML IJ SOLN
INTRAMUSCULAR | Status: DC | PRN
  Administered 2022-01-23: 5000 [IU] via INTRAVENOUS

## 2022-01-23 MED ORDER — HYDRALAZINE HCL 20 MG/ML IJ SOLN: 5.0000 mg | INTRAMUSCULAR | Status: DC | PRN

## 2022-01-23 MED ORDER — ONDANSETRON HCL 4 MG/2ML IJ SOLN
INTRAMUSCULAR | Status: AC
  Filled 2022-01-23: qty 2

## 2022-01-23 MED ORDER — ACETAMINOPHEN 325 MG PO TABS: 650.0000 mg | ORAL_TABLET | ORAL | Status: DC | PRN

## 2022-01-23 MED ORDER — MORPHINE SULFATE (PF) 4 MG/ML IV SOLN: 2.0000 mg | INTRAVENOUS | Status: DC | PRN

## 2022-01-23 MED ORDER — PHENYLEPHRINE 80 MCG/ML (10ML) SYRINGE FOR IV PUSH (FOR BLOOD PRESSURE SUPPORT)
PREFILLED_SYRINGE | INTRAVENOUS | Status: AC
  Filled 2022-01-23: qty 10

## 2022-01-23 MED ORDER — SODIUM CHLORIDE 0.9% FLUSH: 3.0000 mL | Freq: Two times a day (BID) | INTRAVENOUS | Status: DC

## 2022-01-23 MED ORDER — CEFAZOLIN SODIUM-DEXTROSE 2-4 GM/100ML-% IV SOLN
INTRAVENOUS | Status: AC
  Administered 2022-01-23: 2 g via INTRAVENOUS
  Filled 2022-01-23: qty 100

## 2022-01-23 MED ORDER — LABETALOL HCL 5 MG/ML IV SOLN: 10.0000 mg | INTRAVENOUS | Status: DC | PRN

## 2022-01-23 MED ORDER — DIPHENHYDRAMINE HCL 50 MG/ML IJ SOLN
INTRAMUSCULAR | Status: DC | PRN
  Administered 2022-01-23: 25 mg via INTRAVENOUS

## 2022-01-23 MED ORDER — SODIUM CHLORIDE 0.9 % IV SOLN: 250.0000 mL | INTRAVENOUS | Status: DC | PRN

## 2022-01-23 MED ORDER — MIDAZOLAM HCL 2 MG/ML PO SYRP: 8.0000 mg | ORAL_SOLUTION | Freq: Once | ORAL | Status: DC | PRN

## 2022-01-23 MED ORDER — OXYCODONE HCL 5 MG PO TABS: 5.0000 mg | ORAL_TABLET | ORAL | Status: DC | PRN

## 2022-01-23 MED ORDER — METHYLPREDNISOLONE SODIUM SUCC 125 MG IJ SOLR: 125.0000 mg | Freq: Once | INTRAMUSCULAR | Status: DC | PRN

## 2022-01-23 MED ORDER — FAMOTIDINE 20 MG PO TABS: 40.0000 mg | ORAL_TABLET | Freq: Once | ORAL | Status: DC | PRN

## 2022-01-23 MED ORDER — HYDROMORPHONE HCL 1 MG/ML IJ SOLN: 1.0000 mg | Freq: Once | INTRAMUSCULAR | Status: DC | PRN

## 2022-01-23 MED ORDER — CEFAZOLIN SODIUM-DEXTROSE 2-4 GM/100ML-% IV SOLN: 2.0000 g | INTRAVENOUS | Status: AC

## 2022-01-23 MED ORDER — MIDAZOLAM HCL 2 MG/2ML IJ SOLN
INTRAMUSCULAR | Status: AC
  Filled 2022-01-23: qty 4

## 2022-01-23 MED ORDER — ONDANSETRON HCL 4 MG/2ML IJ SOLN: 4.0000 mg | Freq: Four times a day (QID) | INTRAMUSCULAR | Status: DC | PRN

## 2022-01-23 MED ORDER — PHENYLEPHRINE HCL-NACL 20-0.9 MG/250ML-% IV SOLN
INTRAVENOUS | Status: AC
  Filled 2022-01-23: qty 250

## 2022-01-23 MED ORDER — HEPARIN SODIUM (PORCINE) 1000 UNIT/ML IJ SOLN
INTRAMUSCULAR | Status: AC
  Filled 2022-01-23: qty 10

## 2022-01-23 MED ORDER — FENTANYL CITRATE (PF) 100 MCG/2ML IJ SOLN
INTRAMUSCULAR | Status: DC | PRN
  Administered 2022-01-23: 25 ug via INTRAVENOUS
  Administered 2022-01-23: 50 ug via INTRAVENOUS

## 2022-01-23 MED ORDER — DIPHENHYDRAMINE HCL 50 MG/ML IJ SOLN
INTRAMUSCULAR | Status: AC
  Filled 2022-01-23: qty 1

## 2022-01-23 MED ORDER — SODIUM CHLORIDE 0.9% FLUSH: 3.0000 mL | INTRAVENOUS | Status: DC | PRN

## 2022-01-23 MED ORDER — MIDAZOLAM HCL 2 MG/2ML IJ SOLN
INTRAMUSCULAR | Status: DC | PRN
  Administered 2022-01-23: .5 mg via INTRAVENOUS
  Administered 2022-01-23: 2 mg via INTRAVENOUS

## 2022-01-23 MED ORDER — IODIXANOL 320 MG/ML IV SOLN
INTRAVENOUS | Status: DC | PRN
  Administered 2022-01-23: 70 mL

## 2022-01-23 MED ORDER — DIPHENHYDRAMINE HCL 50 MG/ML IJ SOLN: 50.0000 mg | Freq: Once | INTRAMUSCULAR | Status: DC | PRN

## 2022-01-23 MED ORDER — ONDANSETRON HCL 4 MG/2ML IJ SOLN
INTRAMUSCULAR | Status: DC | PRN
  Administered 2022-01-23: 4 mg via INTRAVENOUS

## 2022-01-23 MED ORDER — SODIUM CHLORIDE 0.9 % IV SOLN: INTRAVENOUS | Status: DC

## 2022-01-23 MED ORDER — FENTANYL CITRATE PF 50 MCG/ML IJ SOSY
PREFILLED_SYRINGE | INTRAMUSCULAR | Status: AC
  Filled 2022-01-23: qty 2

## 2022-01-23 SURGICAL SUPPLY — 17 items
CATH ANGIO 5F PIGTAIL 100CM (CATHETERS) IMPLANT
CATH HEADHUNTER H1 5F 100CM (CATHETERS) IMPLANT
CATH MICROCATH PRGRT 2.8F 130 (MICROCATHETER) IMPLANT
COVER PROBE ULTRASOUND 5X96 (MISCELLANEOUS) IMPLANT
DEVICE STARCLOSE SE CLOSURE (Vascular Products) IMPLANT
GLIDEWIRE ANGLED SS 035X260CM (WIRE) IMPLANT
GUIDEWIRE VASC STIFF .038X260 (WIRE) IMPLANT
KIT CAROTID MANIFOLD (MISCELLANEOUS) IMPLANT
MICROCATH PROGREAT 2.8F 130CM (MICROCATHETER) ×1
NDL ENTRY 21GA 7CM ECHOTIP (NEEDLE) IMPLANT
NEEDLE ENTRY 21GA 7CM ECHOTIP (NEEDLE) ×1 IMPLANT
PACK ANGIOGRAPHY (CUSTOM PROCEDURE TRAY) ×1 IMPLANT
SET INTRO CAPELLA COAXIAL (SET/KITS/TRAYS/PACK) IMPLANT
SHEATH BRITE TIP 6FRX11 (SHEATH) IMPLANT
SHEATH SHUTTLE 6FRX80 (SHEATH) IMPLANT
SYR MEDRAD MARK 7 150ML (SYRINGE) IMPLANT
WIRE GUIDERIGHT .035X150 (WIRE) IMPLANT

## 2022-01-23 NOTE — Discharge Instructions (Signed)
Follow up with Dr Nino Parsley office on Monday 30 October at 1015.

## 2022-01-23 NOTE — Op Note (Signed)
[facility]   OPERATIVE NOTE  DATE: 01/23/2022  PRE-OPERATIVE DIAGNOSIS:  1.  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  Inability of the CT angiogram and the MR a of the neck to determine whether the left internal carotid artery is occluded  POST-OPERATIVE DIAGNOSIS: Same as above  PROCEDURE: 1.   Ultrasound Guidance for vascular access right femoral artery 2.   Catheter placement into left common carotid artery from right femoral approach 3.   Thoracic aortogram 4.   Cervical left carotid angiogram 5.   StarClose closure device right femoral artery  SURGEON: Hortencia Pilar, MD  ASSISTANT(S): None  ANESTHESIA: Moderate conscious sedation  ESTIMATED BLOOD LOSS: 10 cc  FLUORO TIME: 12.4 minutes  CONTRAST: 70 cc  MODERATE CONSCIOUS SEDATION TIME: Continuous ECG pulse oximetry and cardiopulmonary monitoring was performed throughout the entire procedure by the interventional radiology nurse total sedation time was 49 minutes and 27 seconds.     FINDING(S): 1.  There is indeed a subtotal occlusion greater than 99 percent stenosis of the left carotid bifurcation with very faint and sluggish flow within the left internal carotid artery all the way up to the carotid siphon.  It is indeterminate as to whether there is distal carotid occlusive disease.  Additionally noted is a 60% stenosis of the distal common carotid artery approximately 2 to 3 cm distal to the carotid bifurcation.  The external carotid artery is indeed occluded on the left  SPECIMEN(S):  None  INDICATIONS:   Patient is a 76 y.o.male who presents with history of a stroke. The patient's CT angiogram as well as his MRA are both nondiagnostic right. Catheter-based angiogram is performed for further evaluation. Risks and benefits are discussed and informed consent was obtained.  DESCRIPTION: After obtaining full informed written consent, the patient was brought back to the  operating room and placed supine upon the vascular suite table.  After obtaining adequate anesthesia, the patient was prepped and draped in the standard fashion.  Moderate conscious sedation was administered during a face to face encounter with the patient throughout the procedure with my supervision of the RN administering medicines and monitoring the patients vital signs and mental status throughout from the start of the procedure until the patient was taken to the recovery room. The right femoral artery was visualized with ultrasound and found to be calcific but patent. It was then accessed under direct ultrasound guidance without difficulty with a Seldinger needle. A J-wire and 5 French sheath were placed and a permanent image was recorded. The patient was given 5000 units of intravenous heparin. A pigtail catheter was placed into the ascending aorta and an LAO projection thoracic aortogram was performed. This showed a type II arch the origin of the innominate and the left subclavian are widely patent.  Initially images suggested the left common carotid artery was occluded however on closer examination there did appear to be a faint filling of the ostia of the common carotid artery on the left.  I then selected a H1 catheter and cannulated the innominate artery advancing into the mid right common carotid artery. Cervical and cerebral carotid angiography was attempted but I was unable to get enough contrast to ascertain cerebral imaging.  I therefore move forward with obtaining multiple cervical views on the left.  The cervical carotid artery was widely patent at its origin.  In the distal portion there was a 65% stenosis approximately 2 to 3 cm proximal to the carotid bifurcation.  Multiple views were taken in the cervical carotid artery.  Imaging of the cervical left internal and external carotid artery is then obtained.  A very bulky densely calcified lesion is easily identified even on fluoroscopy.  There is  trickle flow through the proximal internal carotid artery and then faint filling of the internal that appears to be almost atretic.  As noted above I am unable to ascertain whether there is any distal internal carotid artery disease or not but there is filling all the way up to the siphon.  The external carotid artery is occluded at its origin.  At this point, we had imaging to plan our treatment and we elected to terminate the procedure. The diagnostic catheter was removed. Oblique arteriogram was performed of the right femoral artery and StarClose closure device was deployed in usual fashion with excellent hemostatic result. The patient tolerated the procedure well and was taken to the recovery room in stable condition.  COMPLICATIONS: None  CONDITION: Stable   Hortencia Pilar 01/23/2022 2:35 PM   This note was created with Dragon Medical transcription system. Any errors in dictation are purely unintentional.

## 2022-01-24 ENCOUNTER — Encounter: Payer: Self-pay | Admitting: Vascular Surgery

## 2022-01-29 ENCOUNTER — Ambulatory Visit (INDEPENDENT_AMBULATORY_CARE_PROVIDER_SITE_OTHER): Payer: Medicare HMO | Admitting: Vascular Surgery

## 2022-01-29 ENCOUNTER — Other Ambulatory Visit (INDEPENDENT_AMBULATORY_CARE_PROVIDER_SITE_OTHER): Payer: Medicare HMO | Admitting: Vascular Surgery

## 2022-01-29 ENCOUNTER — Telehealth (INDEPENDENT_AMBULATORY_CARE_PROVIDER_SITE_OTHER): Payer: Self-pay

## 2022-01-29 ENCOUNTER — Encounter (INDEPENDENT_AMBULATORY_CARE_PROVIDER_SITE_OTHER): Payer: Self-pay

## 2022-01-29 DIAGNOSIS — I6522 Occlusion and stenosis of left carotid artery: Secondary | ICD-10-CM

## 2022-01-29 NOTE — Telephone Encounter (Signed)
Spoke with the patient and let him know that a referral has been sent to [facility] Dr. Donivan Scull office for him to get cardiac clearance. Patient was advised to call the office to have his appt scheduled to be seen.

## 2022-01-29 NOTE — H&P (View-Only) (Signed)
Need cardiac clearance for carotid endarterectomy

## 2022-01-29 NOTE — Progress Notes (Signed)
Need cardiac clearance for carotid endarterectomy

## 2022-01-31 ENCOUNTER — Other Ambulatory Visit: Payer: Self-pay | Admitting: Primary Care

## 2022-01-31 DIAGNOSIS — E782 Mixed hyperlipidemia: Secondary | ICD-10-CM

## 2022-02-05 NOTE — Interval H&P Note (Signed)
History and Physical Interval Note:  02/05/2022 3:21 PM  Alan [name]  has presented today for surgery, with the diagnosis of LT Carotid Angiography with poss stent placement   TIA.  The various methods of treatment have been discussed with the patient and family. After consideration of risks, benefits and other options for treatment, the patient has consented to  Procedure(s): CAROTID ANGIOGRAPHY (Left) as a surgical intervention.  The patient's history has been reviewed, patient examined, no change in status, stable for surgery.  I have reviewed the patient's chart and labs.  Questions were answered to the patient's satisfaction.     Hortencia Pilar

## 2022-02-09 ENCOUNTER — Encounter: Payer: Self-pay | Admitting: Cardiology

## 2022-02-09 ENCOUNTER — Ambulatory Visit: Payer: Medicare HMO | Attending: Cardiology | Admitting: Cardiology

## 2022-02-09 VITALS — BP 110/60 | HR 61 | Ht 72.0 in | Wt 245.0 lb

## 2022-02-09 DIAGNOSIS — I10 Essential (primary) hypertension: Secondary | ICD-10-CM

## 2022-02-09 DIAGNOSIS — I6522 Occlusion and stenosis of left carotid artery: Secondary | ICD-10-CM | POA: Diagnosis not present

## 2022-02-09 DIAGNOSIS — I739 Peripheral vascular disease, unspecified: Secondary | ICD-10-CM | POA: Diagnosis not present

## 2022-02-09 DIAGNOSIS — Z01810 Encounter for preprocedural cardiovascular examination: Secondary | ICD-10-CM | POA: Diagnosis not present

## 2022-02-09 DIAGNOSIS — Z8673 Personal history of transient ischemic attack (TIA), and cerebral infarction without residual deficits: Secondary | ICD-10-CM | POA: Diagnosis not present

## 2022-02-09 DIAGNOSIS — E785 Hyperlipidemia, unspecified: Secondary | ICD-10-CM | POA: Diagnosis not present

## 2022-02-09 DIAGNOSIS — R600 Localized edema: Secondary | ICD-10-CM

## 2022-02-09 DIAGNOSIS — R7303 Prediabetes: Secondary | ICD-10-CM | POA: Diagnosis not present

## 2022-02-09 DIAGNOSIS — R0600 Dyspnea, unspecified: Secondary | ICD-10-CM | POA: Insufficient documentation

## 2022-02-09 DIAGNOSIS — R0609 Other forms of dyspnea: Secondary | ICD-10-CM | POA: Diagnosis not present

## 2022-02-09 NOTE — Progress Notes (Unsigned)
Primary Care Provider: Pleas Koch, NP [facility] Cardiologist: None Electrophysiologist: None  Clinic Note: No chief complaint on file.   ===================================  ASSESSMENT/PLAN   Problem List Items Addressed This Visit   None   ===================================  HPI:    Alan [name] is a 76 y.o. male who is being seen today for the evaluation of *** at the request of [name], [name], MD.  [name] was last seen on ***  Recent Hospitalizations: ***  Reviewed  CV studies:    The following studies were reviewed today: (if available, images/films reviewed: From Epic Chart or Care Everywhere) ***:   Interval History:   [name]   CV Review of Symptoms (Summary) Cardiovascular ROS: {roscv:310661}  REVIEWED OF SYSTEMS   ROS  I have reviewed and (if needed) personally updated the patient's problem list, medications, allergies, past medical and surgical history, social and family history.   PAST MEDICAL HISTORY   Past Medical History:  Diagnosis Date   Allergic rhinitis, cause unspecified    Allergy    Cancer ([place])    Basal cell skin cancer,left ear   Carotid artery stenosis    left   Carpal tunnel syndrome    Chronic airway obstruction, not elsewhere classified    COPD with acute exacerbation ([place]) 10/02/2007   Qualifier: Diagnosis of  By: [name] MD, Clinton D    Esophageal reflux    Glaucoma (increased eye pressure)    Lipoma of unspecified site    Osteoarthrosis, unspecified whether generalized or localized, lower leg    Other and unspecified hyperlipidemia    Personal history of colonic polyps    Personal history of other malignant neoplasm of skin    Unspecified essential hypertension    Viral gastroenteritis 09/20/2021    PAST SURGICAL HISTORY   Past Surgical History:  Procedure Laterality Date   bone spur  06-2003   right thumb   CAROTID ANGIOGRAPHY Left 01/23/2022   Procedure:  CAROTID ANGIOGRAPHY;  Surgeon: [name], MD;  Location: [facility];  Service: Cardiovascular;  Laterality: Left;   CATARACT EXTRACTION     KNEE ARTHROSCOPY  09-2007   partial medial mesicecotmy, patellar chonfroplasty   SHOULDER SURGERY  03-24-2007   removal bone spur   [facility] Dr[facility]   TOTAL KNEE ARTHROPLASTY     03-2009 [facility]   TOTAL KNEE ARTHROPLASTY Left 12/2012   [facility]    Immunization History  Administered Date(s) Administered   Fluad Quad(high Dose 65+) 12/14/2019, 12/02/2020   Influenza Split 03/02/2011, 02/28/2013   Influenza Whole 01/24/2007, 01/06/2008, 02/02/2009   Influenza, High Dose Seasonal PF 12/26/2016, 12/22/2017   Influenza,inj,Quad PF,6+ Mos 12/02/2015   Influenza-Unspecified 03/22/2014, 01/05/2015   PFIZER Comirnaty(Gray Top)Covid-19 Tri-Sucrose Vaccine 08/08/2020   PFIZER(Purple Top)SARS-COV-2 Vaccination 04/27/2019, 05/18/2019, 01/21/2020   Pfizer Covid-19 Vaccine Bivalent Booster 49yr & up 01/17/2021   Pneumococcal Conjugate-13 05/03/2014   Pneumococcal Polysaccharide-23 12/05/2011   Td 06/13/2006   Tdap 10/15/2016   Zoster Recombinat (Shingrix) 02/05/2020, 05/03/2020   Zoster, Live 09/03/2006    MEDICATIONS/ALLERGIES   Current Meds  Medication Sig   amLODipine (NORVASC) 10 MG tablet TAKE 1 TABLET BY MOUTH EVERY DAY FOR BLOOD PRESSURE   atorvastatin (LIPITOR) 80 MG tablet TAKE 1 TABLET BY MOUTH EVERY DAY for cholesterol.   clopidogrel (PLAVIX) 75 MG tablet TAKE 1 TABLET BY MOUTH EVERY DAY   fish oil-omega-3 fatty acids 1000  MG capsule Take 2 g by mouth daily.   ketoconazole (NIZORAL) 2 % shampoo SMARTSIG:Milliliter(s) Topical   losartan (COZAAR) 100 MG tablet TAKE 1 TABLET BY MOUTH EVERY DAY FOR BLOOD PRESSURE   montelukast (SINGULAIR) 10 MG tablet TAKE 1 TABLET (10 MG TOTAL) BY MOUTH AT BEDTIME FOR ALLERGIES   Multiple Vitamin (MULTIVITAMIN) tablet Take 1 tablet by  mouth daily.   pantoprazole (PROTONIX) 40 MG tablet TAKE 1 TABLET BY MOUTH DAILY FOR HEARTBURN.   sertraline (ZOLOFT) 100 MG tablet TAKE 1 TABLET (100 MG TOTAL) BY MOUTH DAILY. FOR ANXIETY AND DEPRESSION.    No Known Allergies  SOCIAL HISTORY/FAMILY HISTORY   Reviewed in Epic:   Social History   Tobacco Use   Smoking status: Former    Packs/day: 3.00    Years: 25.00    Total pack years: 75.00    Types: Cigarettes    Quit date: 11/23/1996    Years since quitting: 25.2   Smokeless tobacco: Never  Vaping Use   Vaping Use: Never used  Substance Use Topics   Alcohol use: Not Currently    Alcohol/week: 0.0 standard drinks of alcohol   Drug use: No   Social History   Social History Narrative   Regular exercise -- no            Family History  Problem Relation Age of Onset   Diabetes Mother    Hyperlipidemia Mother    Hypertension Mother    Heart attack Mother    Obesity Mother    Hyperlipidemia Father    Hypertension Father    Lung cancer Father    Colon polyps Father    Hypertension Sister    Hyperlipidemia Sister    Lung cancer Sister    Hyperlipidemia Sister    Heart attack Brother    Diabetes Brother    Hyperlipidemia Brother    Hypertension Brother    Hyperlipidemia Brother    Hypertension Brother    Drug abuse Daughter    Colon cancer Neg Hx    Esophageal cancer Neg Hx    Pancreatic cancer Neg Hx    Rectal cancer Neg Hx     OBJCTIVE -PE, EKG, labs   Wt Readings from Last 3 Encounters:  02/09/22 245 lb (111.1 kg)  01/23/22 240 lb (108.9 kg)  12/18/21 248 lb 6.4 oz (112.7 kg)    Physical Exam: BP 110/60   Pulse 61   Ht 6' (1.829 m)   Wt 245 lb (111.1 kg)   SpO2 96%   BMI 33.23 kg/m  Physical Exam   Adult ECG Report  Rate: *** ;  Rhythm: {rhythm:17366};   Narrative Interpretation: ***  Recent Labs:  ***  Lab Results  Component Value Date   CHOL 148 11/07/2021   HDL 54.00 11/07/2021   LDLCALC 74 11/07/2021   LDLDIRECT 74.5  05/31/2021   TRIG 100.0 11/07/2021   CHOLHDL 3 11/07/2021   Lab Results  Component Value Date   CREATININE 0.83 01/23/2022   BUN 17 01/23/2022   NA 141 11/07/2021   K 4.4 11/07/2021   CL 104 11/07/2021   CO2 29 11/07/2021      Latest Ref Rng & Units 11/07/2021    8:58 AM 06/02/2021   10:30 AM 05/30/2021    6:13 PM  CBC  WBC 4.0 - 10.5 K/uL 5.7  6.1    Hemoglobin 13.0 - 17.0 g/dL 13.4  13.2  13.9   Hematocrit 39.0 - 52.0 % 40.3  39.0  41.0   Platelets 150.0 - 400.0 K/uL 135.0  144.0      Lab Results  Component Value Date   HGBA1C 6.2 11/07/2021   Lab Results  Component Value Date   TSH 2.25 11/07/2021    ================================================== I spent a total of *** minutes with the patient spent in direct patient consultation.  Additional time spent with chart review  / charting (studies, outside notes, etc): *** min Total Time: *** min  Current medicines are reviewed at length with the patient today.  (+/- concerns) ***  Notice: This dictation was prepared with Dragon dictation along with smart phrase technology. Any transcriptional errors that result from this process are unintentional and may not be corrected upon review.   Studies Ordered:  No orders of the defined types were placed in this encounter.  No orders of the defined types were placed in this encounter.   Patient Instructions / Medication Changes & Studies & Tests Ordered   There are no Patient Instructions on file for this visit.    [name], MD, MS Glenetta Hew, M.D., M.S. Interventional Cardiologist  [place]  Pager # [phone] Phone # [phone] [address] [place],  [address]   Thank you for choosing [facility] [place]!!

## 2022-02-09 NOTE — Patient Instructions (Signed)
Medication Instructions:  No changes  *If you need a refill on your cardiac medications before your next appointment, please call your pharmacy*   Lab Work: Not needed      Testing/Procedures:  Your physician has requested that you have an echocardiogram. Echocardiography is a painless test that uses sound waves to create images of your heart. It provides your doctor with information about the size and shape of your heart and how well your heart's chambers and valves are working. This procedure takes approximately one hour. There are no restrictions for this procedure. Please do NOT wear cologne, perfume, aftershave, or lotions (deodorant is allowed). Please arrive 15 minutes prior to your appointment time.  Follow-Up: At [facility], you and your health needs are our priority.  As part of our continuing mission to provide you with exceptional heart care, we have created designated Provider Care Teams.  These Care Teams include your primary Cardiologist (physician) and Advanced Practice Providers (APPs -  Physician Assistants and Nurse Practitioners) who all work together to provide you with the care you need, when you need it.     Your next appointment:   2 month(s)  The format for your next appointment:   In Person  Provider:   Glenetta Hew, MD    Other Instructions   If Echo result are normal  -  you will get clearance for Surgery - T- card.

## 2022-02-10 ENCOUNTER — Encounter: Payer: Self-pay | Admitting: Cardiology

## 2022-02-10 NOTE — Assessment & Plan Note (Signed)
Planned "surgery" is TCAR of left carotid artery.  Patient does not have any active angina or heart failure symptoms with no chest pain pressure or dyspnea with rest or exertion.  No PND, orthopnea or edema. A1c is 6.2, therefore not diabetic.  Per Revised Cardiac Risk Index, patient be Class I Risk-low risk procedure, no active cardiac symptoms, nondiabetic with normal renal function -> with planned low risk surgery.  With no active cardiac symptoms, would not warrant any further evaluation besides potentially 2D echo to assess baseline function.  Provided there are no gross abnormalities noted on Echo-(such as wall motion abnormalities that are new, or new structural abnormalities.)  Would recommend proceeding to the OR without further evaluation.  Stress testing but would not change current plan of action

## 2022-02-10 NOTE — Assessment & Plan Note (Signed)
Not much of an issue now, but since this is listed, recheck 2D echo as part of preop assessment.

## 2022-02-10 NOTE — Assessment & Plan Note (Signed)
She barely mentions some mild, non--lifestyle limiting claudication

## 2022-02-10 NOTE — Assessment & Plan Note (Signed)
-   A1c 6.2%

## 2022-02-10 NOTE — Assessment & Plan Note (Signed)
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  Although high risk, carotid/neuro standpoint, this not a high risk procedure from a cardiac standpoint.  There is no distress to the heart

## 2022-02-10 NOTE — Assessment & Plan Note (Signed)
Stable blood pressure today on amlodipine and losartan.  With a heart rate of 61, reasonable to not be on beta-blocker

## 2022-02-10 NOTE — Assessment & Plan Note (Signed)
Not quite at goal with most recent studies.  With severe carotid disease, he would simply have explained goal is to coronary disease of LDL less than 70.  I will see him back after his TCAR procedure and we can address adding potential additional agent such as Nexlizet.

## 2022-02-12 ENCOUNTER — Ambulatory Visit: Payer: Medicare HMO | Admitting: Neurology

## 2022-02-12 ENCOUNTER — Other Ambulatory Visit (INDEPENDENT_AMBULATORY_CARE_PROVIDER_SITE_OTHER): Payer: Medicare HMO

## 2022-02-12 DIAGNOSIS — D696 Thrombocytopenia, unspecified: Secondary | ICD-10-CM

## 2022-02-12 LAB — CBC WITH DIFFERENTIAL/PLATELET
Basophils Absolute: 0 10*3/uL (ref 0.0–0.1)
Basophils Relative: 0.5 % (ref 0.0–3.0)
Eosinophils Absolute: 0.3 10*3/uL (ref 0.0–0.7)
Eosinophils Relative: 6.9 % — ABNORMAL HIGH (ref 0.0–5.0)
HCT: 37.2 % — ABNORMAL LOW (ref 39.0–52.0)
Hemoglobin: 12.5 g/dL — ABNORMAL LOW (ref 13.0–17.0)
Lymphocytes Relative: 27.2 % (ref 12.0–46.0)
Lymphs Abs: 1.4 10*3/uL (ref 0.7–4.0)
MCHC: 33.6 g/dL (ref 30.0–36.0)
MCV: 88.8 fl (ref 78.0–100.0)
Monocytes Absolute: 0.4 10*3/uL (ref 0.1–1.0)
Monocytes Relative: 7.5 % (ref 3.0–12.0)
Neutro Abs: 2.9 10*3/uL (ref 1.4–7.7)
Neutrophils Relative %: 57.9 % (ref 43.0–77.0)
Platelets: 126 10*3/uL — ABNORMAL LOW (ref 150.0–400.0)
RBC: 4.19 Mil/uL — ABNORMAL LOW (ref 4.22–5.81)
RDW: 13.4 % (ref 11.5–15.5)
WBC: 5 10*3/uL (ref 4.0–10.5)

## 2022-02-13 ENCOUNTER — Other Ambulatory Visit: Payer: Self-pay | Admitting: Primary Care

## 2022-02-13 DIAGNOSIS — D696 Thrombocytopenia, unspecified: Secondary | ICD-10-CM

## 2022-02-13 LAB — PATHOLOGIST SMEAR REVIEW

## 2022-02-28 ENCOUNTER — Ambulatory Visit (INDEPENDENT_AMBULATORY_CARE_PROVIDER_SITE_OTHER): Payer: Medicare HMO

## 2022-02-28 DIAGNOSIS — R0609 Other forms of dyspnea: Secondary | ICD-10-CM | POA: Diagnosis not present

## 2022-02-28 DIAGNOSIS — Z01810 Encounter for preprocedural cardiovascular examination: Secondary | ICD-10-CM | POA: Diagnosis not present

## 2022-02-28 LAB — ECHOCARDIOGRAM COMPLETE
Area-P 1/2: 2.69 cm2
S' Lateral: 3.79 cm

## 2022-03-01 ENCOUNTER — Ambulatory Visit (INDEPENDENT_AMBULATORY_CARE_PROVIDER_SITE_OTHER): Payer: Medicare HMO | Admitting: Family Medicine

## 2022-03-01 ENCOUNTER — Encounter: Payer: Self-pay | Admitting: Family Medicine

## 2022-03-01 VITALS — BP 130/60 | HR 67 | Temp 98.3°F | Ht 70.0 in | Wt 245.5 lb

## 2022-03-01 DIAGNOSIS — M19042 Primary osteoarthritis, left hand: Secondary | ICD-10-CM

## 2022-03-01 MED ORDER — TRIAMCINOLONE ACETONIDE 40 MG/ML IJ SUSP
20.0000 mg | Freq: Once | INTRAMUSCULAR | Status: AC
  Administered 2022-03-01: 20 mg via INTRA_ARTICULAR

## 2022-03-01 NOTE — Progress Notes (Signed)
    Alan [name] T. Dhruvan Gullion, MD, [facility] at [facility] [place], [address]  Phone: [phone]  FAX: [phone]  Alan [name] - 76 y.o. male  MRN [id number]  Date of Birth: 1945/04/30  Date: 03/01/2022  PCP: Pleas Koch, NP  Referral: Pleas Koch, NP  Chief Complaint  Patient presents with   Hand Pain    Left Index Finger   Subjective:   Alan [name] is a 76 y.o. very pleasant male patient with Body mass index is 35.23 kg/m. who presents with the following:  Pleasant patient who I know well over the years presents with some right index finger pain.  2nd MCP inj  Procedure only    ICD-10-CM   1. 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 Risks benefits, and alternatives were discussed. Prepped with Chloraprep and Ethyl Chloride used for anesthesia. Under sterile conditions, patient injected into 2nd MCP joint perpendicularly with traction placed on finger. Aspiration yields no blood. Decreased pain post injection. No complications. Needle size: 25 gauge Injection: 1/2 cc of Lidocaine 1% and Kenalog 20 mg Medication: 1/2 cc of Kenalog 40 mg (equaling Kenalog 20 mg)   Medication Management during today's office visit: Meds ordered this encounter  Medications   triamcinolone acetonide (KENALOG-40) injection 20 mg   Signed,  Viraj Liby T. Jamell Laymon, MD   Outpatient Encounter Medications as of 03/01/2022  Medication Sig   amLODipine (NORVASC) 10 MG tablet TAKE 1 TABLET BY MOUTH EVERY DAY FOR BLOOD PRESSURE   atorvastatin (LIPITOR) 80 MG tablet TAKE 1 TABLET BY MOUTH EVERY  DAY for cholesterol.   clopidogrel (PLAVIX) 75 MG tablet TAKE 1 TABLET BY MOUTH EVERY DAY   fish oil-omega-3 fatty acids 1000 MG capsule Take 2 g by mouth daily.   ketoconazole (NIZORAL) 2 % shampoo SMARTSIG:Milliliter(s) Topical   losartan (COZAAR) 100 MG tablet TAKE 1 TABLET BY MOUTH EVERY DAY FOR BLOOD PRESSURE   montelukast (SINGULAIR) 10 MG tablet TAKE 1 TABLET (10 MG TOTAL) BY MOUTH AT BEDTIME FOR ALLERGIES   Multiple Vitamin (MULTIVITAMIN) tablet Take 1 tablet by mouth daily.   pantoprazole (PROTONIX) 40 MG tablet TAKE 1 TABLET BY MOUTH DAILY FOR HEARTBURN.   sertraline (ZOLOFT) 100 MG tablet TAKE 1 TABLET (100 MG TOTAL) BY MOUTH DAILY. FOR ANXIETY AND DEPRESSION.   [EXPIRED] triamcinolone acetonide (KENALOG-40) injection 20 mg    No facility-administered encounter medications on file as of 03/01/2022.

## 2022-03-18 NOTE — Progress Notes (Unsigned)
MRN : [id number]  Alan [name] is a 76 y.o. (03/25/1946) male who presents with chief complaint of check carotid arteries.  History of Present Illness:   The patient is seen for follow up evaluation of carotid stenosis.  Procedure 01/23/2022: Left Carotid angiogram which showed a string sign in the left bulb with a patent but atretic left ICA  The patient denies amaurosis fugax. There is no recent history of TIA symptoms or focal motor deficits. There is no prior documented CVA.  The patient is taking enteric-coated aspirin 81 mg daily.  There is no history of migraine headaches. There is no history of seizures.  No recent shortening of the patient's walking distance or new symptoms consistent with claudication.  No history of rest pain symptoms. No new ulcers or wounds of the lower extremities have occurred.  There is no history of DVT, PE or superficial thrombophlebitis. No recent episodes of angina or shortness of breath documented.   Carotid Duplex done today shows ***.  No change compared to last study in ***   No outpatient medications have been marked as taking for the 03/19/22 encounter (Appointment) with Delana Meyer, [name], MD.    Past Medical History:  Diagnosis Date   Allergic rhinitis, cause unspecified    Allergy    Cancer ([facility])    Basal cell skin cancer,left ear   Carotid artery stenosis    left   Carpal tunnel syndrome    Chronic airway obstruction, not elsewhere classified    COPD with acute exacerbation ([place]) 10/02/2007   Qualifier: Diagnosis of  By: [name] MD, Clinton D    Esophageal reflux    Glaucoma (increased eye pressure)    Lipoma of unspecified site    Osteoarthrosis, unspecified whether generalized or localized, lower leg    Other and unspecified hyperlipidemia    Personal history of colonic polyps    Personal history of other malignant neoplasm of skin    Unspecified essential hypertension    Viral gastroenteritis 09/20/2021     Past Surgical History:  Procedure Laterality Date   bone spur  06-2003   right thumb   CAROTID ANGIOGRAPHY Left 01/23/2022   Procedure: CAROTID ANGIOGRAPHY;  Surgeon: [name], MD;  Location: [facility];  Service: Cardiovascular;  Laterality: Left;   CATARACT EXTRACTION     KNEE ARTHROSCOPY  09-2007   partial medial mesicecotmy, patellar chonfroplasty   SHOULDER SURGERY  03-24-2007   removal bone spur   SHOULDER SURGERY Left 07/2016   [facility], Dr. Harmon Pier   TOTAL KNEE ARTHROPLASTY     03-2009 [facility]   TOTAL KNEE ARTHROPLASTY Left 12/2012   [facility]    Social History Social History   Tobacco Use   Smoking status: Former    Packs/day: 3.00    Years: 25.00    Total pack years: 75.00    Types: Cigarettes    Quit date: 11/23/1996    Years since quitting: 25.3   Smokeless tobacco: Never  Vaping Use   Vaping Use: Never used  Substance Use Topics   Alcohol use: Not Currently    Alcohol/week: 0.0 standard drinks of alcohol   Drug use: No    Family History Family History  Problem Relation Age of Onset   Diabetes Mother    Hyperlipidemia Mother    Hypertension Mother    Heart attack Mother    Obesity Mother    Hyperlipidemia Father    Hypertension Father  Lung cancer Father    Colon polyps Father    Hypertension Sister    Hyperlipidemia Sister    Lung cancer Sister    Hyperlipidemia Sister    Heart attack Brother    Diabetes Brother    Hyperlipidemia Brother    Hypertension Brother    Hyperlipidemia Brother    Hypertension Brother    Drug abuse Daughter    Colon cancer Neg Hx    Esophageal cancer Neg Hx    Pancreatic cancer Neg Hx    Rectal cancer Neg Hx     No Known Allergies   REVIEW OF SYSTEMS (Negative unless checked)  Constitutional: '[]'$ Weight loss  '[]'$ Fever  '[]'$ Chills Cardiac: '[]'$ Chest pain   '[]'$ Chest pressure   '[]'$ Palpitations   '[]'$ Shortness of breath when laying flat   '[]'$ Shortness of breath with  exertion. Vascular:  '[x]'$ Pain in legs with walking   '[]'$ Pain in legs at rest  '[]'$ History of DVT   '[]'$ Phlebitis   '[]'$ Swelling in legs   '[]'$ Varicose veins   '[]'$ Non-healing ulcers Pulmonary:   '[]'$ Uses home oxygen   '[]'$ Productive cough   '[]'$ Hemoptysis   '[]'$ Wheeze  '[]'$ COPD   '[]'$ Asthma Neurologic:  '[x]'$ Dizziness   '[]'$ Seizures   '[]'$ History of stroke   '[]'$ History of TIA  '[]'$ Aphasia   '[]'$ Vissual changes   '[]'$ Weakness or numbness in arm   '[]'$ Weakness or numbness in leg Musculoskeletal:   '[]'$ Joint swelling   '[]'$ Joint pain   '[]'$ Low back pain Hematologic:  '[]'$ Easy bruising  '[]'$ Easy bleeding   '[]'$ Hypercoagulable state   '[]'$ Anemic Gastrointestinal:  '[]'$ Diarrhea   '[]'$ Vomiting  '[x]'$ Gastroesophageal reflux/heartburn   '[]'$ Difficulty swallowing. Genitourinary:  '[]'$ Chronic kidney disease   '[]'$ Difficult urination  '[]'$ Frequent urination   '[]'$ Blood in urine Skin:  '[]'$ Rashes   '[]'$ Ulcers  Psychological:  '[]'$ History of anxiety   '[]'$  History of major depression.  Physical Examination  There were no vitals filed for this visit. There is no height or weight on file to calculate BMI. Gen: WD/WN, NAD Head: [place]/AT, No temporalis wasting.  Ear/Nose/Throat: Hearing grossly intact, nares w/o erythema or drainage Eyes: PER, EOMI, sclera nonicteric.  Neck: Supple, no masses.  No bruit or JVD.  Pulmonary:  Good air movement, no audible wheezing, no use of accessory muscles.  Cardiac: RRR, normal S1, S2, no Murmurs. Vascular:  carotid bruit noted Vessel Right Left  Radial Palpable Palpable  Carotid  Palpable  Palpable  Subclav  Palpable Palpable  Gastrointestinal: soft, non-distended. No guarding/no peritoneal signs.  Musculoskeletal: M/S 5/5 throughout.  No visible deformity.  Neurologic: CN 2-12 intact. Pain and light touch intact in extremities.  Symmetrical.  Speech is fluent. Motor exam as listed above. Psychiatric: Judgment intact, Mood & affect appropriate for pt's clinical situation. Dermatologic: No rashes or ulcers noted.  No changes consistent with  cellulitis.   CBC Lab Results  Component Value Date   WBC 5.0 02/12/2022   HGB 12.5 (L) 02/12/2022   HCT 37.2 (L) 02/12/2022   MCV 88.8 02/12/2022   PLT 126.0 (L) 02/12/2022    BMET    Component Value Date/Time   NA 141 11/07/2021 0858   NA 136 01/16/2013 0616   K 4.4 11/07/2021 0858   K 3.5 01/16/2013 0616   CL 104 11/07/2021 0858   CL 105 01/16/2013 0616   CO2 29 11/07/2021 0858   CO2 26 01/16/2013 0616   GLUCOSE 110 (H) 11/07/2021 0858   GLUCOSE 126 (H) 01/16/2013 0616   BUN 17 01/23/2022 1218   BUN 10 01/16/2013 7035  CREATININE 0.83 01/23/2022 1218   CREATININE 0.69 01/16/2013 0616   CALCIUM 9.5 11/07/2021 0858   CALCIUM 9.0 01/16/2013 0616   GFRNONAA >60 01/23/2022 1218   GFRNONAA >60 01/16/2013 0616   GFRAA >60 01/16/2013 0616   CrCl cannot be calculated (Patient's most recent lab result is older than the maximum 21 days allowed.).  COAG Lab Results  Component Value Date   INR 1.0 05/30/2021   INR 1.0 11/07/2020   INR 1.0 12/24/2012    Radiology ECHOCARDIOGRAM COMPLETE  Result Date: 02/28/2022    ECHOCARDIOGRAM REPORT   Patient Name:   Alan [name] Date of Exam: 02/28/2022 Medical Rec #:  [id number]         Height:       72.0 in Accession #:    [id number]        Weight:       245.0 lb Date of Birth:  April 20, 1945          BSA:          2.322 m Patient Age:    38 years          BP:           122/73 mmHg Patient Gender: M                 HR:           62 bpm. Exam Location:  Outpatient Procedure: 2D Echo, 3D Echo, Color Doppler, Cardiac Doppler and Strain Analysis Indications:    R06.9 DOE  History:        Patient has prior history of Echocardiogram examinations, most                 recent 05/31/2021. PAD, Signs/Symptoms:Dyspnea; Risk                 Factors:Hypertension, Dyslipidemia and Former Smoker. Carotid                 artery stenosis.  Sonographer:    Leavy Cella RDCS Referring Phys: [place]  1. Left ventricular ejection  fraction, by estimation, is 55 to 60%. The left ventricle has normal function. The left ventricle has no regional wall motion abnormalities. Left ventricular diastolic parameters were normal. The average left ventricular global longitudinal strain is -18.2 %. The global longitudinal strain is normal.  2. Right ventricular systolic function is normal. The right ventricular size is normal.  3. Left atrial size was mildly dilated.  4. The mitral valve is normal in structure. Trivial mitral valve regurgitation. No evidence of mitral stenosis.  5. The aortic valve is tricuspid. Aortic valve regurgitation is not visualized. Aortic valve sclerosis is present, with no evidence of aortic valve stenosis.  6. The inferior vena cava is normal in size with greater than 50% respiratory variability, suggesting right atrial pressure of 3 mmHg. Comparison(s): No significant change from prior study. FINDINGS  Left Ventricle: Left ventricular ejection fraction, by estimation, is 55 to 60%. The left ventricle has normal function. The left ventricle has no regional wall motion abnormalities. The average left ventricular global longitudinal strain is -18.2 %. The global longitudinal strain is normal. The left ventricular internal cavity size was normal in size. There is no left ventricular hypertrophy. Left ventricular diastolic parameters were normal. Right Ventricle: The right ventricular size is normal. Right ventricular systolic function is normal. Left Atrium: Left atrial size was mildly dilated. Right Atrium: Right atrial size was normal in size. Pericardium:  There is no evidence of pericardial effusion. Mitral Valve: The mitral valve is normal in structure. Trivial mitral valve regurgitation. No evidence of mitral valve stenosis. Tricuspid Valve: The tricuspid valve is normal in structure. Tricuspid valve regurgitation is trivial. No evidence of tricuspid stenosis. Aortic Valve: The aortic valve is tricuspid. Aortic valve  regurgitation is not visualized. Aortic valve sclerosis is present, with no evidence of aortic valve stenosis. Pulmonic Valve: The pulmonic valve was normal in structure. Pulmonic valve regurgitation is trivial. No evidence of pulmonic stenosis. Aorta: The aortic root is normal in size and structure. Venous: The inferior vena cava is normal in size with greater than 50% respiratory variability, suggesting right atrial pressure of 3 mmHg. IAS/Shunts: No atrial level shunt detected by color flow Doppler.  LEFT VENTRICLE PLAX 2D LVIDd:         5.12 cm   Diastology LVIDs:         3.79 cm   LV e' medial:    8.92 cm/s LV PW:         1.34 cm   LV E/e' medial:  8.7 LV IVS:        1.16 cm   LV e' lateral:   11.10 cm/s LVOT diam:     2.10 cm   LV E/e' lateral: 7.0 LV SV:         81 LV SV Index:   35        2D Longitudinal Strain LVOT Area:     3.46 cm  2D Strain GLS Avg:     -18.2 %                           3D Volume EF:                          3D EF:        55 %                          LV EDV:       182 ml                          LV ESV:       83 ml                          LV SV:        100 ml RIGHT VENTRICLE RV Basal diam:  5.15 cm RV Mid diam:    3.52 cm RV S prime:     11.20 cm/s TAPSE (M-mode): 2.9 cm LEFT ATRIUM              Index        RIGHT ATRIUM           Index LA diam:        4.30 cm  1.85 cm/m   RA Area:     19.40 cm LA Vol (A2C):   115.0 ml 49.52 ml/m  RA Volume:   61.20 ml  26.35 ml/m LA Vol (A4C):   84.8 ml  36.51 ml/m LA Biplane Vol: 100.0 ml 43.06 ml/m  AORTIC VALVE LVOT Vmax:   99.70 cm/s LVOT Vmean:  64.500 cm/s LVOT VTI:    0.234 m  AORTA Ao Root diam: 3.30 cm MITRAL VALVE  TRICUSPID VALVE MV Area (PHT): 2.69 cm    TR Peak grad:   14.9 mmHg MV Decel Time: 282 msec    TR Vmax:        193.00 cm/s MV E velocity: 77.60 cm/s MV A velocity: 92.80 cm/s  SHUNTS MV E/A ratio:  0.84        Systemic VTI:  0.23 m                            Systemic Diam: 2.10 cm Kirk Ruths MD  Electronically signed by Kirk Ruths MD Signature Date/Time: 02/28/2022/2:53:25 PM    Final      Assessment/Plan There are no diagnoses linked to this encounter.   Hortencia Pilar, MD  03/18/2022 3:37 PM

## 2022-03-18 NOTE — H&P (View-Only) (Signed)
MRN : [id number]  Alan [name] is a 76 y.o. (09-20-1945) male who presents with chief complaint of check carotid arteries.  History of Present Illness:   The patient is seen for follow up evaluation of carotid stenosis.  Procedure 01/23/2022: Left Carotid angiogram which showed a string sign in the left bulb with a patent but atretic left ICA  The patient denies amaurosis fugax. There is no recent history of TIA symptoms or focal motor deficits. There is no prior documented CVA.  The patient is taking enteric-coated aspirin 81 mg daily.  There is no history of migraine headaches. There is no history of seizures.  No recent shortening of the patient's walking distance or new symptoms consistent with claudication.  No history of rest pain symptoms. No new ulcers or wounds of the lower extremities have occurred.  There is no history of DVT, PE or superficial thrombophlebitis. No recent episodes of angina or shortness of breath documented.    No outpatient medications have been marked as taking for the 03/19/22 encounter (Appointment) with Delana Meyer, [name], MD.    Past Medical History:  Diagnosis Date   Allergic rhinitis, cause unspecified    Allergy    Cancer ([place])    Basal cell skin cancer,left ear   Carotid artery stenosis    left   Carpal tunnel syndrome    Chronic airway obstruction, not elsewhere classified    COPD with acute exacerbation ([place]) 10/02/2007   Qualifier: Diagnosis of  By: [name] MD, Clinton D    Esophageal reflux    Glaucoma (increased eye pressure)    Lipoma of unspecified site    Osteoarthrosis, unspecified whether generalized or localized, lower leg    Other and unspecified hyperlipidemia    Personal history of colonic polyps    Personal history of other malignant neoplasm of skin    Unspecified essential hypertension    Viral gastroenteritis 09/20/2021    Past Surgical History:  Procedure Laterality Date   bone spur  06-2003   right  thumb   CAROTID ANGIOGRAPHY Left 01/23/2022   Procedure: CAROTID ANGIOGRAPHY;  Surgeon: [name], MD;  Location: [facility];  Service: Cardiovascular;  Laterality: Left;   CATARACT EXTRACTION     KNEE ARTHROSCOPY  09-2007   partial medial mesicecotmy, patellar chonfroplasty   SHOULDER SURGERY  03-24-2007   removal bone spur   SHOULDER SURGERY Left 07/2016   [facility], Dr. Harmon Pier   TOTAL KNEE ARTHROPLASTY     03-2009 [facility]   TOTAL KNEE ARTHROPLASTY Left 12/2012   [facility]    Social History Social History   Tobacco Use   Smoking status: Former    Packs/day: 3.00    Years: 25.00    Total pack years: 75.00    Types: Cigarettes    Quit date: 11/23/1996    Years since quitting: 25.3   Smokeless tobacco: Never  Vaping Use   Vaping Use: Never used  Substance Use Topics   Alcohol use: Not Currently    Alcohol/week: 0.0 standard drinks of alcohol   Drug use: No    Family History Family History  Problem Relation Age of Onset   Diabetes Mother    Hyperlipidemia Mother    Hypertension Mother    Heart attack Mother    Obesity Mother    Hyperlipidemia Father    Hypertension Father    Lung cancer Father    Colon polyps Father    Hypertension  Sister    Hyperlipidemia Sister    Lung cancer Sister    Hyperlipidemia Sister    Heart attack Brother    Diabetes Brother    Hyperlipidemia Brother    Hypertension Brother    Hyperlipidemia Brother    Hypertension Brother    Drug abuse Daughter    Colon cancer Neg Hx    Esophageal cancer Neg Hx    Pancreatic cancer Neg Hx    Rectal cancer Neg Hx     No Known Allergies   REVIEW OF SYSTEMS (Negative unless checked)  Constitutional: '[]'$ Weight loss  '[]'$ Fever  '[]'$ Chills Cardiac: '[]'$ Chest pain   '[]'$ Chest pressure   '[]'$ Palpitations   '[]'$ Shortness of breath when laying flat   '[]'$ Shortness of breath with exertion. Vascular:  '[x]'$ Pain in legs with walking   '[]'$ Pain in legs at rest  '[]'$ History of DVT    '[]'$ Phlebitis   '[]'$ Swelling in legs   '[]'$ Varicose veins   '[]'$ Non-healing ulcers Pulmonary:   '[]'$ Uses home oxygen   '[]'$ Productive cough   '[]'$ Hemoptysis   '[]'$ Wheeze  '[]'$ COPD   '[]'$ Asthma Neurologic:  '[x]'$ Dizziness   '[]'$ Seizures   '[]'$ History of stroke   '[]'$ History of TIA  '[]'$ Aphasia   '[]'$ Vissual changes   '[]'$ Weakness or numbness in arm   '[]'$ Weakness or numbness in leg Musculoskeletal:   '[]'$ Joint swelling   '[]'$ Joint pain   '[]'$ Low back pain Hematologic:  '[]'$ Easy bruising  '[]'$ Easy bleeding   '[]'$ Hypercoagulable state   '[]'$ Anemic Gastrointestinal:  '[]'$ Diarrhea   '[]'$ Vomiting  '[x]'$ Gastroesophageal reflux/heartburn   '[]'$ Difficulty swallowing. Genitourinary:  '[]'$ Chronic kidney disease   '[]'$ Difficult urination  '[]'$ Frequent urination   '[]'$ Blood in urine Skin:  '[]'$ Rashes   '[]'$ Ulcers  Psychological:  '[]'$ History of anxiety   '[]'$  History of major depression.  Physical Examination  There were no vitals filed for this visit. There is no height or weight on file to calculate BMI. Gen: WD/WN, NAD Head: [place]/AT, No temporalis wasting.  Ear/Nose/Throat: Hearing grossly intact, nares w/o erythema or drainage Eyes: PER, EOMI, sclera nonicteric.  Neck: Supple, no masses.  No bruit or JVD.  Pulmonary:  Good air movement, no audible wheezing, no use of accessory muscles.  Cardiac: RRR, normal S1, S2, no Murmurs. Vascular:  no carotid bruit noted Vessel Right Left  Radial Palpable Palpable  Carotid  Palpable  Palpable  Subclav  Palpable Palpable  Gastrointestinal: soft, non-distended. No guarding/no peritoneal signs.  Musculoskeletal: M/S 5/5 throughout.  No visible deformity.  Neurologic: CN 2-12 intact. Pain and light touch intact in extremities.  Symmetrical.  Speech is fluent. Motor exam as listed above. Psychiatric: Judgment intact, Mood & affect appropriate for pt's clinical situation. Dermatologic: No rashes or ulcers noted.  No changes consistent with cellulitis.   CBC Lab Results  Component Value Date   WBC 5.0 02/12/2022   HGB 12.5 (L)  02/12/2022   HCT 37.2 (L) 02/12/2022   MCV 88.8 02/12/2022   PLT 126.0 (L) 02/12/2022    BMET    Component Value Date/Time   NA 141 11/07/2021 0858   NA 136 01/16/2013 0616   K 4.4 11/07/2021 0858   K 3.5 01/16/2013 0616   CL 104 11/07/2021 0858   CL 105 01/16/2013 0616   CO2 29 11/07/2021 0858   CO2 26 01/16/2013 0616   GLUCOSE 110 (H) 11/07/2021 0858   GLUCOSE 126 (H) 01/16/2013 0616   BUN 17 01/23/2022 1218   BUN 10 01/16/2013 0616   CREATININE 0.83 01/23/2022 1218   CREATININE 0.69 01/16/2013 4696  CALCIUM 9.5 11/07/2021 0858   CALCIUM 9.0 01/16/2013 0616   GFRNONAA >60 01/23/2022 1218   GFRNONAA >60 01/16/2013 0616   GFRAA >60 01/16/2013 0616   CrCl cannot be calculated (Patient's most recent lab result is older than the maximum 21 days allowed.).  COAG Lab Results  Component Value Date   INR 1.0 05/30/2021   INR 1.0 11/07/2020   INR 1.0 12/24/2012    Radiology ECHOCARDIOGRAM COMPLETE  Result Date: 02/28/2022    ECHOCARDIOGRAM REPORT   Patient Name:   CAYSON KALB Date of Exam: 02/28/2022 Medical Rec #:  [id number]         Height:       72.0 in Accession #:    [id number]        Weight:       245.0 lb Date of Birth:  [birth year]          BSA:          2.322 m Patient Age:    76 years          BP:           122/73 mmHg Patient Gender: M                 HR:           62 bpm. Exam Location:  Outpatient Procedure: 2D Echo, 3D Echo, Color Doppler, Cardiac Doppler and Strain Analysis Indications:    R06.9 DOE  History:        Patient has prior history of Echocardiogram examinations, most                 recent 05/31/2021. PAD, Signs/Symptoms:Dyspnea; Risk                 Factors:Hypertension, Dyslipidemia and Former Smoker. Carotid                 artery stenosis.  Sonographer:    Leavy Cella RDCS Referring Phys: [place]  1. Left ventricular ejection fraction, by estimation, is 55 to 60%. The left ventricle has normal function. The left ventricle  has no regional wall motion abnormalities. Left ventricular diastolic parameters were normal. The average left ventricular global longitudinal strain is -18.2 %. The global longitudinal strain is normal.  2. Right ventricular systolic function is normal. The right ventricular size is normal.  3. Left atrial size was mildly dilated.  4. The mitral valve is normal in structure. Trivial mitral valve regurgitation. No evidence of mitral stenosis.  5. The aortic valve is tricuspid. Aortic valve regurgitation is not visualized. Aortic valve sclerosis is present, with no evidence of aortic valve stenosis.  6. The inferior vena cava is normal in size with greater than 50% respiratory variability, suggesting right atrial pressure of 3 mmHg. Comparison(s): No significant change from prior study. FINDINGS  Left Ventricle: Left ventricular ejection fraction, by estimation, is 55 to 60%. The left ventricle has normal function. The left ventricle has no regional wall motion abnormalities. The average left ventricular global longitudinal strain is -18.2 %. The global longitudinal strain is normal. The left ventricular internal cavity size was normal in size. There is no left ventricular hypertrophy. Left ventricular diastolic parameters were normal. Right Ventricle: The right ventricular size is normal. Right ventricular systolic function is normal. Left Atrium: Left atrial size was mildly dilated. Right Atrium: Right atrial size was normal in size. Pericardium: There is no evidence of pericardial effusion. Mitral Valve: The mitral valve  is normal in structure. Trivial mitral valve regurgitation. No evidence of mitral valve stenosis. Tricuspid Valve: The tricuspid valve is normal in structure. Tricuspid valve regurgitation is trivial. No evidence of tricuspid stenosis. Aortic Valve: The aortic valve is tricuspid. Aortic valve regurgitation is not visualized. Aortic valve sclerosis is present, with no evidence of aortic valve  stenosis. Pulmonic Valve: The pulmonic valve was normal in structure. Pulmonic valve regurgitation is trivial. No evidence of pulmonic stenosis. Aorta: The aortic root is normal in size and structure. Venous: The inferior vena cava is normal in size with greater than 50% respiratory variability, suggesting right atrial pressure of 3 mmHg. IAS/Shunts: No atrial level shunt detected by color flow Doppler.  LEFT VENTRICLE PLAX 2D LVIDd:         5.12 cm   Diastology LVIDs:         3.79 cm   LV e' medial:    8.92 cm/s LV PW:         1.34 cm   LV E/e' medial:  8.7 LV IVS:        1.16 cm   LV e' lateral:   11.10 cm/s LVOT diam:     2.10 cm   LV E/e' lateral: 7.0 LV SV:         81 LV SV Index:   35        2D Longitudinal Strain LVOT Area:     3.46 cm  2D Strain GLS Avg:     -18.2 %                           3D Volume EF:                          3D EF:        55 %                          LV EDV:       182 ml                          LV ESV:       83 ml                          LV SV:        100 ml RIGHT VENTRICLE RV Basal diam:  5.15 cm RV Mid diam:    3.52 cm RV S prime:     11.20 cm/s TAPSE (M-mode): 2.9 cm LEFT ATRIUM              Index        RIGHT ATRIUM           Index LA diam:        4.30 cm  1.85 cm/m   RA Area:     19.40 cm LA Vol (A2C):   115.0 ml 49.52 ml/m  RA Volume:   61.20 ml  26.35 ml/m LA Vol (A4C):   84.8 ml  36.51 ml/m LA Biplane Vol: 100.0 ml 43.06 ml/m  AORTIC VALVE LVOT Vmax:   99.70 cm/s LVOT Vmean:  64.500 cm/s LVOT VTI:    0.234 m  AORTA Ao Root diam: 3.30 cm MITRAL VALVE               TRICUSPID  VALVE MV Area (PHT): 2.69 cm    TR Peak grad:   14.9 mmHg MV Decel Time: 282 msec    TR Vmax:        193.00 cm/s MV E velocity: 77.60 cm/s MV A velocity: 92.80 cm/s  SHUNTS MV E/A ratio:  0.84        Systemic VTI:  0.23 m                            Systemic Diam: 2.10 cm Kirk Ruths MD Electronically signed by Kirk Ruths MD Signature Date/Time: 02/28/2022/2:53:25 PM    Final       Assessment/Plan 1. Stenosis of left carotid artery Recommend:  The patient remains asymptomatic with respect to the carotid stenosis.  However, the patient has now progressed and has a lesion that is >95% associated with an atretic but patent left internal carotid artery.  This means he remains at risk for a stroke.  Patient's CT angiography of the carotid arteries confirms >95% left ICA stenosis.  The anatomical considerations support surgery over stenting.  This was discussed in detail with the patient.  The possibility of ligating the atretic internal carotid artery was also discussed as well as the reasons and implications.  The patient does indeed need surgery, therefore, cardiac clearance will be arranged. Once cleared the patient will be scheduled for surgery.  The risks, benefits and alternative therapies were reviewed in detail with the patient.  All questions were answered.  The patient agrees to proceed with surgery of the left carotid artery.  Continue antiplatelet therapy as prescribed. Continue management of CAD, HTN and Hyperlipidemia. Healthy heart diet, encouraged exercise at least 4 times per week.   2. Essential hypertension Continue antihypertensive medications as already ordered, these medications have been reviewed and there are no changes at this time.  3. PAD (peripheral artery disease) ([place]) Recommend:  I do not find evidence of life style limiting vascular disease. The patient specifically denies life style limitation.  Previous noninvasive studies including ABI's of the legs do not identify critical vascular problems.  The patient should continue walking and begin a more formal exercise program. The patient should continue his antiplatelet therapy and aggressive treatment of the lipid abnormalities.  The patient is instructed to call the office if there is a significant change in the lower extremity symptoms, particularly if a wound develops or there is  an abrupt increase in leg pain.  4. Hyperlipidemia LDL goal <70 Continue statin as ordered and reviewed, no changes at this time    Hortencia Pilar, MD  03/18/2022 3:37 PM

## 2022-03-19 ENCOUNTER — Ambulatory Visit (INDEPENDENT_AMBULATORY_CARE_PROVIDER_SITE_OTHER): Payer: Medicare HMO | Admitting: Vascular Surgery

## 2022-03-19 ENCOUNTER — Encounter (INDEPENDENT_AMBULATORY_CARE_PROVIDER_SITE_OTHER): Payer: Self-pay | Admitting: Vascular Surgery

## 2022-03-19 VITALS — BP 117/70 | HR 79 | Resp 17 | Ht 72.0 in | Wt 242.0 lb

## 2022-03-19 DIAGNOSIS — E785 Hyperlipidemia, unspecified: Secondary | ICD-10-CM | POA: Diagnosis not present

## 2022-03-19 DIAGNOSIS — I6522 Occlusion and stenosis of left carotid artery: Secondary | ICD-10-CM | POA: Diagnosis not present

## 2022-03-19 DIAGNOSIS — I10 Essential (primary) hypertension: Secondary | ICD-10-CM | POA: Diagnosis not present

## 2022-03-19 DIAGNOSIS — I739 Peripheral vascular disease, unspecified: Secondary | ICD-10-CM

## 2022-03-28 ENCOUNTER — Telehealth (INDEPENDENT_AMBULATORY_CARE_PROVIDER_SITE_OTHER): Payer: Self-pay

## 2022-03-28 DIAGNOSIS — L728 Other follicular cysts of the skin and subcutaneous tissue: Secondary | ICD-10-CM | POA: Diagnosis not present

## 2022-03-28 DIAGNOSIS — D225 Melanocytic nevi of trunk: Secondary | ICD-10-CM | POA: Diagnosis not present

## 2022-03-28 DIAGNOSIS — L570 Actinic keratosis: Secondary | ICD-10-CM | POA: Diagnosis not present

## 2022-03-28 DIAGNOSIS — L538 Other specified erythematous conditions: Secondary | ICD-10-CM | POA: Diagnosis not present

## 2022-03-28 DIAGNOSIS — L814 Other melanin hyperpigmentation: Secondary | ICD-10-CM | POA: Diagnosis not present

## 2022-03-28 DIAGNOSIS — B078 Other viral warts: Secondary | ICD-10-CM | POA: Diagnosis not present

## 2022-03-28 DIAGNOSIS — L821 Other seborrheic keratosis: Secondary | ICD-10-CM | POA: Diagnosis not present

## 2022-03-28 DIAGNOSIS — L84 Corns and callosities: Secondary | ICD-10-CM | POA: Diagnosis not present

## 2022-03-28 NOTE — Telephone Encounter (Signed)
I attempted to contact the patient to discuss his surgery and give him all the information regarding getting scheduled and the day as well. A message was left for a return call. Patient's mobile phone does not have a voice mail box set up.

## 2022-03-29 ENCOUNTER — Telehealth (INDEPENDENT_AMBULATORY_CARE_PROVIDER_SITE_OTHER): Payer: Self-pay | Admitting: Vascular Surgery

## 2022-03-29 NOTE — Telephone Encounter (Signed)
Patient called about his upcoming surgery and wanted to know the time.  Please advise.

## 2022-04-04 ENCOUNTER — Telehealth (INDEPENDENT_AMBULATORY_CARE_PROVIDER_SITE_OTHER): Payer: Self-pay

## 2022-04-04 ENCOUNTER — Other Ambulatory Visit (INDEPENDENT_AMBULATORY_CARE_PROVIDER_SITE_OTHER): Payer: Self-pay | Admitting: Nurse Practitioner

## 2022-04-04 DIAGNOSIS — I6522 Occlusion and stenosis of left carotid artery: Secondary | ICD-10-CM

## 2022-04-04 NOTE — Telephone Encounter (Signed)
Spoke with the patient to give him an update regarding his surgery. Patient is scheduled with Dr. Delana Meyer for a left carotid endarterectomy on [facility]/11/[facility] at [facility]. Pre-op phone call is on 04/05/22 between 1-5 pm. Pre-surgical instructions were discussed and patient was also informed that at this time I was waiting on his prior authorization to go through. This was initiated on 04/03/22.

## 2022-04-05 ENCOUNTER — Encounter
Admission: RE | Admit: 2022-04-05 | Discharge: 2022-04-05 | Disposition: A | Discharge status: Home / Self Care | Payer: Medicare HMO | Source: Ambulatory Visit | Attending: Vascular Surgery | Admitting: Vascular Surgery

## 2022-04-05 ENCOUNTER — Other Ambulatory Visit: Payer: Self-pay

## 2022-04-05 DIAGNOSIS — I6522 Occlusion and stenosis of left carotid artery: Secondary | ICD-10-CM | POA: Diagnosis not present

## 2022-04-05 DIAGNOSIS — Z01812 Encounter for preprocedural laboratory examination: Secondary | ICD-10-CM | POA: Insufficient documentation

## 2022-04-05 HISTORY — DX: Other specified postprocedural states: Z98.890

## 2022-04-05 HISTORY — DX: Depression, unspecified: F32.A

## 2022-04-05 HISTORY — DX: Personal history of urinary calculi: Z87.442

## 2022-04-05 HISTORY — DX: Nausea with vomiting, unspecified: R11.2

## 2022-04-05 HISTORY — DX: Peripheral vascular disease, unspecified: I73.9

## 2022-04-05 HISTORY — DX: Anxiety disorder, unspecified: F41.9

## 2022-04-05 HISTORY — DX: Dyspnea, unspecified: R06.00

## 2022-04-05 HISTORY — DX: Cerebral infarction, unspecified: I63.9

## 2022-04-05 HISTORY — DX: Atherosclerotic heart disease of native coronary artery without angina pectoris: I25.10

## 2022-04-05 LAB — CBC WITH DIFFERENTIAL/PLATELET
Abs Immature Granulocytes: 0.03 10*3/uL (ref 0.00–0.07)
Basophils Absolute: 0 10*3/uL (ref 0.0–0.1)
Basophils Relative: 0 %
Eosinophils Absolute: 0.3 10*3/uL (ref 0.0–0.5)
Eosinophils Relative: 6 %
HCT: 38.3 % — ABNORMAL LOW (ref 39.0–52.0)
Hemoglobin: 12.6 g/dL — ABNORMAL LOW (ref 13.0–17.0)
Immature Granulocytes: 1 %
Lymphocytes Relative: 33 %
Lymphs Abs: 1.7 10*3/uL (ref 0.7–4.0)
MCH: 29.2 pg (ref 26.0–34.0)
MCHC: 32.9 g/dL (ref 30.0–36.0)
MCV: 88.7 fL (ref 80.0–100.0)
Monocytes Absolute: 0.5 10*3/uL (ref 0.1–1.0)
Monocytes Relative: 10 %
Neutro Abs: 2.7 10*3/uL (ref 1.7–7.7)
Neutrophils Relative %: 50 %
Platelets: 142 10*3/uL — ABNORMAL LOW (ref 150–400)
RBC: 4.32 MIL/uL (ref 4.22–5.81)
RDW: 12.3 % (ref 11.5–15.5)
WBC: 5.3 10*3/uL (ref 4.0–10.5)
nRBC: 0 % (ref 0.0–0.2)

## 2022-04-05 LAB — BASIC METABOLIC PANEL
Anion gap: 9 (ref 5–15)
BUN: 20 mg/dL (ref 8–23)
CO2: 28 mmol/L (ref 22–32)
Calcium: 8.8 mg/dL — ABNORMAL LOW (ref 8.9–10.3)
Chloride: 103 mmol/L (ref 98–111)
Creatinine, Ser: 0.77 mg/dL (ref 0.61–1.24)
GFR, Estimated: >60 mL/min (ref >60)
Glucose, Bld: 90 mg/dL (ref 70–99)
Potassium: 3.6 mmol/L (ref 3.5–5.1)
Sodium: 140 mmol/L (ref 135–145)

## 2022-04-05 LAB — TYPE AND SCREEN
ABO/RH(D): B POS
Antibody Screen: NEGATIVE

## 2022-04-05 NOTE — Pre-Procedure Instructions (Signed)
[name], MD 02/28/2022 10:51 PM EST   Echocardiogram results: No change from prior study.   Normal left ventricular pump function with an ejection fraction of 55 to 60%.  Normal range being 50 to 70%.  There are no wall motion abnormalities to suggest prior heart attack or injury. The relaxation is relatively normal.  Left atrium is mildly dilated which would suggest that there may be some mild abnormality of laxation but otherwise nothing significant. Valves look pretty normal.  There is no evidence of calcification valve but no narrowing.  This would be called sclerosis but not stenosis.   Otherwise normal study.   Glenetta Hew, MD

## 2022-04-05 NOTE — Pre-Procedure Instructions (Signed)
Cardiac clearance from Dr Ellyn Hack in [place] 02-09-22

## 2022-04-05 NOTE — Pre-Procedure Instructions (Signed)
Alan Man, MD Physician Cardiology   Progress Notes    Signed   Encounter Date: 02/09/2022   Signed     Expand All Collapse All    Primary Care Provider: Pleas Koch, NP [place] Cardiologist: Alan Hew, MD Electrophysiologist: None   Clinic Note:     Chief Complaint  Patient presents with   New Patient (Initial Visit)      Ostensibly for preop evaluation   Carotid artery disease      Subtotal occlusion of left carotid      ===================================   ASSESSMENT/PLAN    Problem List Items Addressed This Visit              Cardiology Problems    Carotid artery stenosis - Primary (Chronic)      Subtotal occlusion of the left carotid artery, pending TCAR.   Although high risk, carotid/neuro standpoint, this not a high risk procedure from a cardiac standpoint.  There is no distress to the heart        Relevant Orders    EKG 12-Lead (Completed)    Essential hypertension (Chronic)      Stable blood pressure today on amlodipine and losartan.  With a heart rate of 61, reasonable to not be on beta-blocker        Hyperlipidemia LDL goal <70 (Chronic)      Not quite at goal with most recent studies.  With severe carotid disease, he would simply have explained goal is to coronary disease of LDL less than 70.  I will see him back after his TCAR procedure and we can address adding potential additional agent such as Nexlizet.        PAD (peripheral artery disease) (HCC) (Chronic)      She barely mentions some mild, non--lifestyle limiting claudication            Other    Dyspnea    Relevant Orders    EKG 12-Lead (Completed)    ECHOCARDIOGRAM COMPLETE    History of CVA (cerebrovascular accident)    Prediabetes      A1c 6.2.        Preoperative cardiovascular examination      Planned "surgery" is TCAR of left carotid artery.   Patient does not have any active angina or heart failure symptoms with no chest pain pressure or  dyspnea with rest or exertion.  No PND, orthopnea or edema. A1c is 6.2, therefore not diabetic.   Per Revised Cardiac Risk Index, patient be Class I Risk-low risk procedure, no active cardiac symptoms, nondiabetic with normal renal function -> with planned low risk surgery.   With no active cardiac symptoms, would not warrant any further evaluation besides potentially 2D echo to assess baseline function.  Provided there are no gross abnormalities noted on Echo-(such as wall motion abnormalities that are new, or new structural abnormalities.)  Would recommend proceeding to the OR without further evaluation.   Stress testing but would not change current plan of action        Relevant Orders    EKG 12-Lead (Completed)    ECHOCARDIOGRAM COMPLETE    Bilateral lower extremity edema (Chronic)      Not much of an issue now, but since this is listed, recheck 2D echo as part of preop assessment.        ===================================   HPI:     Alan [name] is a 77 y.o. male with PMH notable for longstanding COPD, likely PAD,  HTN, HLD and recent TIA (documented episodes of dizziness) found to have severe Left-Sided Carotid Disease who is being seen today for the Preop Cardiac Evaluation for left coronary TCAR at the request of [name], [name], MD.   Recent Hospitalizations:  05/22/2021-([facility]) ER visit double vision-potential CVA-discharged on 3/1:.  Carotid US revealed left ICA and CCA occlusion.  Apparently he woke up with double vision, went to see the ophthalmologist who was concerned about acute skew deviation.  Other than a headache, he was doing fine.  No symptoms to suggest other neurologic issues, and no CV complaints.  MRI head with MR- angiogram ordered.-Have discharged on a aspirin 325 mg daily, and Brilinta 90 mg twice daily.   He was seen by Tory Emerald, NP from pain medicine-indicated plans to stop full dose aspirin after 21 days.  Apparently, he was converted from Brilinta  to clopidogrel by the time he was seen by neurology in May   Alan [name] has been followed by Dr. Delana Meyer for severe left-sided carotid artery disease as well as likely PAD with nonlimiting claudication dating back to August 2022.  Most recent visit was 12/18/2021, and he did notice some recurrence of blurred vision/double vision.  No TIA or amaurosis fugax symptoms.  No motor deficits.  There is suspicion that he has a history of coronary disease but no dyspnea.  Nonlimiting claudication.  At this point he decided to proceed with performing carotid angiography with potential TCAR. => Following coronary angiography, there is plans for possible TCAR and he now presents for preop evaluation.     Reviewed  CV studies:     The following studies were reviewed today: (if available, images/films reviewed: From Epic Chart or Care Everywhere) MRI/MRA neck and brain 05/22/2021: Mild white matter changes consistent with chronic microvascular ischemia.  No evidence of prior CVA.  Left internal carotic artery potential occlusion with loss of flow through the skull base and cavernous segment.  Similar findings to CT angiogram in 11/18/2020 Carotid Duplex 8/41/6606: Left LICA occlusion.  R ICA 139%.  Stable compared to 11/14/2020 Echocardiogram with bubble study 05/31/2021: Normal LV size and function.  No RWMA.  Normal diastolic parameters.  Normal RV.  Normal mitral valve and aortic valve.  Negative bubble study.   Carotid Artery Angiography 01/23/2022: Subtotal occlusion,> 99% stenosis of the left carotid bifurcation with very faint and sluggish flow within the LICA although not to the carotid siphon..  Also noted 50% stenosis of distal common carotid artery 2-3 cm distal to bifurcation.  External carotid occluded     Interval History:    Alan [name] presents today, not really sure why he is seeing me.  He does not understand.  He says that besides having a few these dizzy spells where he feels like he  may pass out where he is has poor balance and poor vision, he is not having any issues at all chest discomfort or pressure pain with rest or exertion.  No signs or symptoms of arrhythmia.  He has baseline COPD (has not smoked in 25 years) and has always had some mild exertional dyspnea. He has mild baseline claudication symptoms but not very limiting as far symptoms go.  What been limiting and mostly recently has been his back pain but also his dizzy episodes of double vision and poor balance.   Pretty much asymptomatic my cardiovascular standpoint besides mild claudication and the double vision issues.   CV Review of Symptoms (Summary): positive for -  dyspnea on exertion and TAVR the spells, dizziness, near syncope with 1 potential episode of syncope less than 2 seconds.  No change in exertional dyspnea. negative for - chest pain, edema, irregular heartbeat, orthopnea, palpitations, paroxysmal nocturnal dyspnea, rapid heart rate, shortness of breath, or any true TIA or amaurosis fugax symptoms unless double vision is amaurosis fugax.   REVIEWED OF SYSTEMS    Review of Systems  Constitutional:  Negative for malaise/fatigue (Not really malaise and fatigue, just not very active has not been very active since February.) and weight loss.  HENT:  Negative for congestion and nosebleeds.   Respiratory:  Positive for shortness of breath (Exertional). Negative for cough and wheezing.   Cardiovascular:  Positive for claudication (Per HPI). Negative for chest pain and leg swelling.  Gastrointestinal:  Negative for abdominal pain, blood in stool and melena.  Genitourinary:  Negative for dysuria and hematuria.  Musculoskeletal:  Positive for back pain. Negative for falls, joint pain and myalgias.  Neurological:  Positive for dizziness and loss of consciousness (Maybe 1 brief episode). Negative for focal weakness, weakness and headaches.  Psychiatric/Behavioral:  Positive for depression (Controlled). Negative  for memory loss. The patient is nervous/anxious (He is starting an exercise but this procedure.). The patient does not have insomnia.      I have reviewed and (if needed) personally updated the patient's problem list, medications, allergies, past medical and surgical history, social and family history.    PAST MEDICAL HISTORY        Past Medical History:  Diagnosis Date   Allergic rhinitis, cause unspecified     Allergy     Cancer ([place])      Basal cell skin cancer,left ear   Carotid artery stenosis      left   Carpal tunnel syndrome     Chronic airway obstruction, not elsewhere classified     COPD with acute exacerbation ([place]) 10/02/2007    Qualifier: Diagnosis of  By: [name] MD, Clinton D    Esophageal reflux     Glaucoma (increased eye pressure)     Lipoma of unspecified site     Osteoarthrosis, unspecified whether generalized or localized, lower leg     Other and unspecified hyperlipidemia     Personal history of colonic polyps     Personal history of other malignant neoplasm of skin     Unspecified essential hypertension     Viral gastroenteritis 09/20/2021      PAST SURGICAL HISTORY         Past Surgical History:  Procedure Laterality Date   bone spur   06-2003    right thumb   CAROTID ANGIOGRAPHY Left 01/23/2022    Procedure: CAROTID ANGIOGRAPHY;  Surgeon: [name], MD;  Location: [facility];  Service: Cardiovascular;  Laterality: Left;   CATARACT EXTRACTION       KNEE ARTHROSCOPY   09-2007    partial medial mesicecotmy, patellar chonfroplasty   SHOULDER SURGERY   03-24-2007    removal bone spur   SHOULDER SURGERY Left 07/2016    [facility], Dr. Harmon Pier   TOTAL KNEE ARTHROPLASTY        03-2009 [facility]   TOTAL KNEE ARTHROPLASTY Left 12/2012    [facility]          Immunization History  Administered Date(s) Administered   Fluad Quad(high Dose 65+) 12/14/2019, 12/02/2020   Influenza Split 03/02/2011, 02/28/2013   Influenza Whole  01/24/2007, 01/06/2008, 02/02/2009   Influenza, High Dose  Seasonal PF 12/26/2016, 12/22/2017   Influenza,inj,Quad PF,6+ Mos 12/02/2015   Influenza-Unspecified 03/22/2014, 01/05/2015   PFIZER Comirnaty(Gray Top)Covid-19 Tri-Sucrose Vaccine 08/08/2020   PFIZER(Purple Top)SARS-COV-2 Vaccination 04/27/2019, 05/18/2019, 01/21/2020   Pfizer Covid-19 Vaccine Bivalent Booster 52yr & up 01/17/2021   Pneumococcal Conjugate-13 05/03/2014   Pneumococcal Polysaccharide-23 12/05/2011   Td 06/13/2006   Tdap 10/15/2016   Zoster Recombinat (Shingrix) 02/05/2020, 05/03/2020   Zoster, Live 09/03/2006      MEDICATIONS/ALLERGIES    Active Medications      Current Meds  Medication Sig   amLODipine (NORVASC) 10 MG tablet TAKE 1 TABLET BY MOUTH EVERY DAY FOR BLOOD PRESSURE   atorvastatin (LIPITOR) 80 MG tablet TAKE 1 TABLET BY MOUTH EVERY DAY for cholesterol.   clopidogrel (PLAVIX) 75 MG tablet TAKE 1 TABLET BY MOUTH EVERY DAY   fish oil-omega-3 fatty acids 1000 MG capsule Take 2 g by mouth daily.   ketoconazole (NIZORAL) 2 % shampoo SMARTSIG:Milliliter(s) Topical   losartan (COZAAR) 100 MG tablet TAKE 1 TABLET BY MOUTH EVERY DAY FOR BLOOD PRESSURE   montelukast (SINGULAIR) 10 MG tablet TAKE 1 TABLET (10 MG TOTAL) BY MOUTH AT BEDTIME FOR ALLERGIES   Multiple Vitamin (MULTIVITAMIN) tablet Take 1 tablet by mouth daily.   pantoprazole (PROTONIX) 40 MG tablet TAKE 1 TABLET BY MOUTH DAILY FOR HEARTBURN.   sertraline (ZOLOFT) 100 MG tablet TAKE 1 TABLET (100 MG TOTAL) BY MOUTH DAILY. FOR ANXIETY AND DEPRESSION.        No Known Allergies   SOCIAL HISTORY/FAMILY HISTORY    Reviewed in Epic:    Social History         Tobacco Use   Smoking status: Former      Packs/day: 3.00      Years: 25.00      Total pack years: 75.00      Types: Cigarettes      Quit date: 11/23/1996      Years since quitting: 25.2   Smokeless tobacco: Never  Vaping Use   Vaping Use: Never used  Substance Use Topics   Alcohol  use: Not Currently      Alcohol/week: 0.0 standard drinks of alcohol   Drug use: No    Social History       Social History Narrative    Regular exercise -- no                        Family History  Problem Relation Age of Onset   Diabetes Mother     Hyperlipidemia Mother     Hypertension Mother     Heart attack Mother     Obesity Mother     Hyperlipidemia Father     Hypertension Father     Lung cancer Father     Colon polyps Father     Hypertension Sister     Hyperlipidemia Sister     Lung cancer Sister     Hyperlipidemia Sister     Heart attack Brother     Diabetes Brother     Hyperlipidemia Brother     Hypertension Brother     Hyperlipidemia Brother     Hypertension Brother     Drug abuse Daughter     Colon cancer Neg Hx     Esophageal cancer Neg Hx     Pancreatic cancer Neg Hx     Rectal cancer Neg Hx        OBJCTIVE -PE, EKG, labs  Wt Readings from Last 3 Encounters:  02/09/22 245 lb (111.1 kg)  01/23/22 240 lb (108.9 kg)  12/18/21 248 lb 6.4 oz (112.7 kg)      Physical Exam: BP 110/60   Pulse 61   Ht 6' (1.829 m)   Wt 245 lb (111.1 kg)   SpO2 96%   BMI 33.23 kg/m  Physical Exam Vitals reviewed.  Constitutional:      General: He is not in acute distress.    Appearance: Normal appearance. He is obese. He is not ill-appearing or toxic-appearing.  HENT:     Head: Normocephalic and atraumatic.  Neck:     Vascular: Carotid bruit (Patient left side) present. No JVD.  Cardiovascular:     Rate and Rhythm: Normal rate and regular rhythm.     Pulses: Normal pulses.     Heart sounds: Normal heart sounds. No murmur heard.    No friction rub. No gallop.  Pulmonary:     Effort: Pulmonary effort is normal. No respiratory distress.     Breath sounds: Normal breath sounds. No wheezing, rhonchi or rales.  Abdominal:     General: Bowel sounds are normal. There is no distension.     Palpations: Abdomen is soft. There is no mass.     Tenderness:  There is no abdominal tenderness. There is no guarding.  Musculoskeletal:        General: No swelling. Normal range of motion.     Cervical back: Normal range of motion and neck supple. Normal range of motion.  Skin:    General: Skin is warm and dry.  Neurological:     General: No focal deficit present.     Mental Status: He is alert and oriented to person, place, and time.     Gait: Gait normal.  Psychiatric:        Mood and Affect: Mood normal.        Behavior: Behavior normal.        Thought Content: Thought content normal.        Judgment: Judgment normal.       Adult ECG Report  Rate: 61 ;  Rhythm: normal sinus rhythm; normal axis, intervals and durations.  Narrative Interpretation: Normal   Recent Labs: Reviewed-LDL not quite at goal. Recent Labs       Lab Results  Component Value Date    CHOL 148 11/07/2021    HDL 54.00 11/07/2021    LDLCALC 74 11/07/2021    LDLDIRECT 74.5 05/31/2021    TRIG 100.0 11/07/2021    CHOLHDL 3 11/07/2021      Recent Labs       Lab Results  Component Value Date    CREATININE 0.83 01/23/2022    BUN 17 01/23/2022    NA 141 11/07/2021    K 4.4 11/07/2021    CL 104 11/07/2021    CO2 29 11/07/2021          Latest Ref Rng & Units 11/07/2021    8:58 AM 06/02/2021   10:30 AM 05/30/2021    6:13 PM  CBC  WBC 4.0 - 10.5 K/uL 5.7  6.1     Hemoglobin 13.0 - 17.0 g/dL 13.4  13.2  13.9   Hematocrit 39.0 - 52.0 % 40.3  39.0  41.0   Platelets 150.0 - 400.0 K/uL 135.0  144.0         Recent Labs       Lab Results  Component Value Date    HGBA1C  6.2 11/07/2021      Recent Labs       Lab Results  Component Value Date    TSH 2.25 11/07/2021        ================================================== I spent a total of 25 minutes with the patient spent in direct patient consultation.  Additional time spent with chart review  / charting (studies, outside notes, etc): 30 min Total Time: 55 min   Current medicines are reviewed at length  with the patient today.  (+/- concerns) none   Notice: This dictation was prepared with Dragon dictation along with smart phrase technology. Any transcriptional errors that result from this process are unintentional and may not be corrected upon review.     Studies Ordered:     Orders Placed This Encounter  Procedures   EKG 12-Lead   ECHOCARDIOGRAM COMPLETE    No orders of the defined types were placed in this encounter.     Patient Instructions / Medication Changes & Studies & Tests Ordered    Patient Instructions  Medication Instructions:  No changes   *If you need a refill on your cardiac medications before your next appointment, please call your pharmacy*     Lab Work: Not needed           Testing/Procedures:   Your physician has requested that you have an echocardiogram. Echocardiography is a painless test that uses sound waves to create images of your heart. It provides your doctor with information about the size and shape of your heart and how well your heart's chambers and valves are working. This procedure takes approximately one hour. There are no restrictions for this procedure. Please do NOT wear cologne, perfume, aftershave, or lotions (deodorant is allowed). Please arrive 15 minutes prior to your appointment time.   Follow-Up: At [facility], you and your health needs are our priority.  As part of our continuing mission to provide you with exceptional heart care, we have created designated Provider Care Teams.  These Care Teams include your primary Cardiologist (physician) and Advanced Practice Providers (APPs -  Physician Assistants and Nurse Practitioners) who all work together to provide you with the care you need, when you need it.       Your next appointment:   2 month(s)   The format for your next appointment:   In Person   Provider:   Glenetta Hew, MD      Other Instructions   If Echo result are normal  -  you will get clearance for  Surgery - T- CAR.         Alan Man, MD, MS Alan [name], M.D., M.S. Interventional Cardiologist  [facility]  Pager # [phone] Phone # [phone] [address]. [place], [place] [address]     Thank you for choosing [place] [facility]!!            Electronically signed by Alan Man, MD at 02/10/2022  7:19 PM     Office Visit on 02/09/2022       Detailed Report      Note shared with patient Additional Documentation  Vitals: BP 110/60   Pulse 61   Ht 6' (1.829 m)   Wt 111.1 kg   SpO2 96%   BMI 33.23 kg/m   BSA 2.38 m      More Vitals  Flowsheets: MEWS Score,   Anthropometrics,   NEWS,   Method of Visit  Encounter Info: Billing Info,   History,  Allergies,   Detailed Report   Orders Placed   ECHOCARDIOGRAM COMPLETE  EKG 12-Lead All Encounter Results Medication Changes   None Medication List Visit Diagnoses   Stenosis of left carotid artery  Essential hypertension  History of CVA (cerebrovascular accident)  Prediabetes  Hyperlipidemia LDL goal <70  Preoperative cardiovascular examination  Dyspnea on exertion  PAD (peripheral artery disease) (HCC)  Bilateral lower extremity edema Pr

## 2022-04-05 NOTE — Patient Instructions (Addendum)
Your procedure is scheduled on: 04/11/22 - Wednesday Report to the Registration Desk on the 1st floor of the [facility]. To find out your arrival time, please call [phone] between 1PM - 3PM on: 04/10/22 - Tuesday If your arrival time is 6:00 am, do not arrive prior to that time as the [facility] entrance doors do not open until 6:00 am.  REMEMBER: Instructions that are not followed completely may result in serious medical risk, up to and including death; or upon the discretion of your surgeon and anesthesiologist your surgery may need to be rescheduled.  Do not eat food or drink any liquids after midnight the night before surgery.  No gum chewing, lozengers or hard candies.   TAKE ONLY THESE MEDICATIONS THE MORNING OF SURGERY WITH A SIP OF WATER:  - pantoprazole (PROTONIX) - (take one the night before and one on the morning of surgery - helps to prevent nausea after surgery.) - amLODipine (NORVASC)  - atorvastatin (LIPITOR)  - sertraline (ZOLOFT)   One week prior to surgery: Stop Anti-inflammatories (NSAIDS) such as Advil, Aleve, Ibuprofen, Motrin, Naproxen, Naprosyn and Aspirin based products such as Excedrin, Goodys Powder, BC Powder.  Stop ANY OVER THE COUNTER supplements until after surgery : fish oil-omega-3 , Multiple Vitamin .  You may however, continue to take Tylenol if needed for pain up until the day of surgery.  No Alcohol for 24 hours before or after surgery.  No Smoking including e-cigarettes for 24 hours prior to surgery.  No chewable tobacco products for at least 6 hours prior to surgery.  No nicotine patches on the day of surgery.  Do not use any "recreational" drugs for at least a week prior to your surgery.  Please be advised that the combination of cocaine and anesthesia may have negative outcomes, up to and including death. If you test positive for cocaine, your surgery will be cancelled.  On the morning of surgery brush your teeth with toothpaste  and water, you may rinse your mouth with mouthwash if you wish. Do not swallow any toothpaste or mouthwash.  Use CHG Soap or wipes as directed on instruction sheet.  Do not wear jewelry, make-up, hairpins, clips or nail polish.  Do not wear lotions, powders, or perfumes.   Do not shave body from the neck down 48 hours prior to surgery just in case you cut yourself which could leave a site for infection.  Also, freshly shaved skin may become irritated if using the CHG soap.  Contact lenses, hearing aids and dentures may not be worn into surgery.  Do not bring valuables to the hospital. [facility] is not responsible for any missing/lost belongings or valuables.   Notify your doctor if there is any change in your medical condition (cold, fever, infection).  Wear comfortable clothing (specific to your surgery type) to the hospital.  After surgery, you can help prevent lung complications by doing breathing exercises.  Take deep breaths and cough every 1-2 hours. Your doctor may order a device called an Incentive Spirometer to help you take deep breaths. When coughing or sneezing, hold a pillow firmly against your incision with both hands. This is called "splinting." Doing this helps protect your incision. It also decreases belly discomfort.  If you are being admitted to the hospital overnight, leave your suitcase in the car. After surgery it may be brought to your room.  If you are being discharged the day of surgery, you will not be allowed to drive home.  You will need a responsible adult (18 years or older) to drive you home and stay with you that night.   If you are taking public transportation, you will need to have a responsible adult (18 years or older) with you. Please confirm with your physician that it is acceptable to use public transportation.   Please call the [facility]. at [phone] if you have any questions about these instructions.  Surgery  Visitation Policy:  Patients undergoing a surgery or procedure may have two family members or support persons with them as long as the person is not COVID-19 positive or experiencing its symptoms.   Inpatient Visitation:    Visiting hours are 7 a.m. to 8 p.m. Up to four visitors are allowed at one time in a patient room. The visitors may rotate out with other people during the day. One designated support person (adult) may remain overnight.  Due to an increase in RSV and influenza rates and associated hospitalizations, children ages 20 and under will not be able to visit patients in [facility]. Masks continue to be strongly recommended.

## 2022-04-06 ENCOUNTER — Telehealth (INDEPENDENT_AMBULATORY_CARE_PROVIDER_SITE_OTHER): Payer: Self-pay

## 2022-04-06 NOTE — Telephone Encounter (Signed)
I requested prior auth from the patient insurance [employer] on 04/03/22 expedited. Checked today and it is in MD review pending.

## 2022-04-09 ENCOUNTER — Ambulatory Visit: Payer: Medicare HMO | Admitting: Cardiology

## 2022-04-11 ENCOUNTER — Encounter: Payer: Self-pay | Admitting: Vascular Surgery

## 2022-04-11 ENCOUNTER — Inpatient Hospital Stay: Payer: Medicare HMO | Admitting: Certified Registered"

## 2022-04-11 ENCOUNTER — Other Ambulatory Visit: Payer: Self-pay

## 2022-04-11 ENCOUNTER — Encounter
Admission: RE | Disposition: A | Discharge status: Home / Self Care | Payer: Self-pay | Source: Home / Self Care | Attending: Vascular Surgery

## 2022-04-11 ENCOUNTER — Inpatient Hospital Stay
Admission: RE | Admit: 2022-04-11 | Discharge: 2022-04-12 | DRG: 039 | Disposition: A | Discharge status: Home / Self Care | Payer: Medicare HMO | Attending: Vascular Surgery | Admitting: Vascular Surgery

## 2022-04-11 DIAGNOSIS — Z96652 Presence of left artificial knee joint: Secondary | ICD-10-CM | POA: Diagnosis present

## 2022-04-11 DIAGNOSIS — I739 Peripheral vascular disease, unspecified: Secondary | ICD-10-CM | POA: Diagnosis not present

## 2022-04-11 DIAGNOSIS — I2510 Atherosclerotic heart disease of native coronary artery without angina pectoris: Secondary | ICD-10-CM | POA: Diagnosis present

## 2022-04-11 DIAGNOSIS — Z832 Family history of diseases of the blood and blood-forming organs and certain disorders involving the immune mechanism: Secondary | ICD-10-CM | POA: Diagnosis not present

## 2022-04-11 DIAGNOSIS — Z8673 Personal history of transient ischemic attack (TIA), and cerebral infarction without residual deficits: Secondary | ICD-10-CM

## 2022-04-11 DIAGNOSIS — E785 Hyperlipidemia, unspecified: Secondary | ICD-10-CM | POA: Diagnosis not present

## 2022-04-11 DIAGNOSIS — I6522 Occlusion and stenosis of left carotid artery: Principal | ICD-10-CM | POA: Diagnosis present

## 2022-04-11 DIAGNOSIS — Z818 Family history of other mental and behavioral disorders: Secondary | ICD-10-CM | POA: Diagnosis not present

## 2022-04-11 DIAGNOSIS — Z8349 Family history of other endocrine, nutritional and metabolic diseases: Secondary | ICD-10-CM

## 2022-04-11 DIAGNOSIS — Z87891 Personal history of nicotine dependence: Secondary | ICD-10-CM | POA: Diagnosis not present

## 2022-04-11 DIAGNOSIS — Z83719 Family history of colon polyps, unspecified: Secondary | ICD-10-CM | POA: Diagnosis not present

## 2022-04-11 DIAGNOSIS — I10 Essential (primary) hypertension: Secondary | ICD-10-CM | POA: Diagnosis present

## 2022-04-11 DIAGNOSIS — Z8249 Family history of ischemic heart disease and other diseases of the circulatory system: Secondary | ICD-10-CM | POA: Diagnosis not present

## 2022-04-11 DIAGNOSIS — Z801 Family history of malignant neoplasm of trachea, bronchus and lung: Secondary | ICD-10-CM

## 2022-04-11 DIAGNOSIS — Z7982 Long term (current) use of aspirin: Secondary | ICD-10-CM | POA: Diagnosis not present

## 2022-04-11 DIAGNOSIS — Z85828 Personal history of other malignant neoplasm of skin: Secondary | ICD-10-CM

## 2022-04-11 DIAGNOSIS — J449 Chronic obstructive pulmonary disease, unspecified: Secondary | ICD-10-CM | POA: Diagnosis not present

## 2022-04-11 DIAGNOSIS — I6529 Occlusion and stenosis of unspecified carotid artery: Secondary | ICD-10-CM | POA: Diagnosis not present

## 2022-04-11 DIAGNOSIS — Z833 Family history of diabetes mellitus: Secondary | ICD-10-CM | POA: Diagnosis not present

## 2022-04-11 HISTORY — DX: Occlusion and stenosis of left carotid artery: I65.22

## 2022-04-11 HISTORY — PX: ENDARTERECTOMY: SHX5162

## 2022-04-11 LAB — ABO/RH: ABO/RH(D): B POS

## 2022-04-11 SURGERY — ENDARTERECTOMY, CAROTID
Anesthesia: General | Laterality: Left

## 2022-04-11 MED ORDER — FENTANYL CITRATE (PF) 100 MCG/2ML IJ SOLN
25.0000 ug | INTRAMUSCULAR | Status: DC | PRN
  Administered 2022-04-11: 50 ug via INTRAVENOUS
  Administered 2022-04-11: 25 ug via INTRAVENOUS

## 2022-04-11 MED ORDER — POTASSIUM CHLORIDE IN NACL 20-0.9 MEQ/L-% IV SOLN
INTRAVENOUS | Status: DC
  Filled 2022-04-11 (×4): qty 1000

## 2022-04-11 MED ORDER — OXYCODONE HCL 5 MG PO TABS
5.0000 mg | ORAL_TABLET | ORAL | Status: DC | PRN
  Administered 2022-04-11: 10 mg via ORAL

## 2022-04-11 MED ORDER — FENTANYL CITRATE (PF) 100 MCG/2ML IJ SOLN
INTRAMUSCULAR | Status: DC | PRN
  Administered 2022-04-11 (×3): 50 ug via INTRAVENOUS

## 2022-04-11 MED ORDER — LIDOCAINE HCL (PF) 1 % IJ SOLN
INTRAMUSCULAR | Status: AC
  Filled 2022-04-11: qty 30

## 2022-04-11 MED ORDER — FENTANYL CITRATE (PF) 100 MCG/2ML IJ SOLN
INTRAMUSCULAR | Status: AC
  Filled 2022-04-11: qty 2

## 2022-04-11 MED ORDER — EPHEDRINE SULFATE (PRESSORS) 50 MG/ML IJ SOLN
INTRAMUSCULAR | Status: DC | PRN
  Administered 2022-04-11: 5 mg via INTRAVENOUS

## 2022-04-11 MED ORDER — PHENYLEPHRINE HCL-NACL 20-0.9 MG/250ML-% IV SOLN
INTRAVENOUS | Status: AC
  Filled 2022-04-11: qty 250

## 2022-04-11 MED ORDER — SCOPOLAMINE 1 MG/3DAYS TD PT72
MEDICATED_PATCH | TRANSDERMAL | Status: AC
  Filled 2022-04-11: qty 1

## 2022-04-11 MED ORDER — DEXAMETHASONE SODIUM PHOSPHATE 10 MG/ML IJ SOLN
INTRAMUSCULAR | Status: DC | PRN
  Administered 2022-04-11: 10 mg via INTRAVENOUS

## 2022-04-11 MED ORDER — SODIUM CHLORIDE 0.9 % IV SOLN: 500.0000 mL | Freq: Once | INTRAVENOUS | Status: DC | PRN

## 2022-04-11 MED ORDER — PANTOPRAZOLE SODIUM 40 MG IV SOLR
INTRAVENOUS | Status: AC
  Administered 2022-04-11: 40 mg via INTRAVENOUS
  Filled 2022-04-11: qty 10

## 2022-04-11 MED ORDER — MIDAZOLAM HCL 2 MG/2ML IJ SOLN
INTRAMUSCULAR | Status: AC
  Filled 2022-04-11: qty 2

## 2022-04-11 MED ORDER — FIBRIN SEALANT 2 ML SINGLE DOSE KIT
PACK | CUTANEOUS | Status: DC | PRN
  Administered 2022-04-11: 2 mL via TOPICAL

## 2022-04-11 MED ORDER — MIDAZOLAM HCL 2 MG/2ML IJ SOLN
INTRAMUSCULAR | Status: DC | PRN
  Administered 2022-04-11: 2 mg via INTRAVENOUS

## 2022-04-11 MED ORDER — GUAIFENESIN-DM 100-10 MG/5ML PO SYRP: 15.0000 mL | ORAL_SOLUTION | ORAL | Status: DC | PRN

## 2022-04-11 MED ORDER — ACETAMINOPHEN 650 MG RE SUPP: 325.0000 mg | RECTAL | Status: DC | PRN

## 2022-04-11 MED ORDER — ATORVASTATIN CALCIUM 20 MG PO TABS: 80.0000 mg | ORAL_TABLET | Freq: Every day | ORAL | Status: DC

## 2022-04-11 MED ORDER — ONDANSETRON HCL 4 MG/2ML IJ SOLN
4.0000 mg | Freq: Four times a day (QID) | INTRAMUSCULAR | Status: DC | PRN
  Administered 2022-04-12 (×2): 4 mg via INTRAVENOUS

## 2022-04-11 MED ORDER — SODIUM CHLORIDE 0.9 % IV SOLN: INTRAVENOUS | Status: DC | PRN

## 2022-04-11 MED ORDER — OXYCODONE HCL 5 MG/5ML PO SOLN: 5.0000 mg | Freq: Once | ORAL | Status: DC | PRN

## 2022-04-11 MED ORDER — CHLORHEXIDINE GLUCONATE 0.12 % MT SOLN: 15.0000 mL | Freq: Once | OROMUCOSAL | Status: AC

## 2022-04-11 MED ORDER — PROPOFOL 10 MG/ML IV BOLUS
INTRAVENOUS | Status: AC
  Filled 2022-04-11: qty 20

## 2022-04-11 MED ORDER — SERTRALINE HCL 50 MG PO TABS: 100.0000 mg | ORAL_TABLET | Freq: Every day | ORAL | Status: DC

## 2022-04-11 MED ORDER — PHENYLEPHRINE 80 MCG/ML (10ML) SYRINGE FOR IV PUSH (FOR BLOOD PRESSURE SUPPORT)
PREFILLED_SYRINGE | INTRAVENOUS | Status: DC | PRN
  Administered 2022-04-11: 160 ug via INTRAVENOUS
  Administered 2022-04-11 (×4): 80 ug via INTRAVENOUS
  Administered 2022-04-11: 160 ug via INTRAVENOUS
  Administered 2022-04-11 (×2): 80 ug via INTRAVENOUS

## 2022-04-11 MED ORDER — ROCURONIUM BROMIDE 100 MG/10ML IV SOLN
INTRAVENOUS | Status: DC | PRN
  Administered 2022-04-11: 30 mg via INTRAVENOUS
  Administered 2022-04-11: 50 mg via INTRAVENOUS
  Administered 2022-04-11: 20 mg via INTRAVENOUS
  Administered 2022-04-11: 30 mg via INTRAVENOUS
  Administered 2022-04-11: 40 mg via INTRAVENOUS

## 2022-04-11 MED ORDER — ACETAMINOPHEN 325 MG PO TABS
325.0000 mg | ORAL_TABLET | ORAL | Status: DC | PRN
  Administered 2022-04-12: 650 mg via ORAL

## 2022-04-11 MED ORDER — PHENYLEPHRINE HCL-NACL 20-0.9 MG/250ML-% IV SOLN
INTRAVENOUS | Status: DC | PRN
  Administered 2022-04-11: 30 ug/min via INTRAVENOUS

## 2022-04-11 MED ORDER — CEFAZOLIN SODIUM-DEXTROSE 2-4 GM/100ML-% IV SOLN
INTRAVENOUS | Status: AC
  Filled 2022-04-11: qty 100

## 2022-04-11 MED ORDER — ORAL CARE MOUTH RINSE: 15.0000 mL | Freq: Once | OROMUCOSAL | Status: AC

## 2022-04-11 MED ORDER — APREPITANT 40 MG PO CAPS
ORAL_CAPSULE | ORAL | Status: AC
  Filled 2022-04-11: qty 1

## 2022-04-11 MED ORDER — OXYCODONE HCL 5 MG PO TABS: 5.0000 mg | ORAL_TABLET | Freq: Once | ORAL | Status: DC | PRN

## 2022-04-11 MED ORDER — CHLORHEXIDINE GLUCONATE 0.12 % MT SOLN
OROMUCOSAL | Status: AC
  Administered 2022-04-11: 15 mL via OROMUCOSAL
  Filled 2022-04-11: qty 15

## 2022-04-11 MED ORDER — ACETAMINOPHEN 10 MG/ML IV SOLN
INTRAVENOUS | Status: DC | PRN
  Administered 2022-04-11: 1000 mg via INTRAVENOUS

## 2022-04-11 MED ORDER — SODIUM CHLORIDE 0.9 % IR SOLN
Status: DC | PRN
  Administered 2022-04-11: 501 mL via SURGICAL_CAVITY

## 2022-04-11 MED ORDER — CEFAZOLIN SODIUM-DEXTROSE 2-4 GM/100ML-% IV SOLN: 2.0000 g | Freq: Three times a day (TID) | INTRAVENOUS | Status: AC

## 2022-04-11 MED ORDER — CHLORHEXIDINE GLUCONATE CLOTH 2 % EX PADS: 6.0000 | MEDICATED_PAD | Freq: Once | CUTANEOUS | Status: DC

## 2022-04-11 MED ORDER — FIBRIN SEALANT 2 ML SINGLE DOSE KIT
PACK | CUTANEOUS | Status: AC
  Filled 2022-04-11: qty 2

## 2022-04-11 MED ORDER — DOCUSATE SODIUM 100 MG PO CAPS: 100.0000 mg | ORAL_CAPSULE | Freq: Every day | ORAL | Status: DC

## 2022-04-11 MED ORDER — HEPARIN SODIUM (PORCINE) 1000 UNIT/ML IJ SOLN
INTRAMUSCULAR | Status: DC | PRN
  Administered 2022-04-11: 7000 [IU] via INTRAVENOUS
  Administered 2022-04-11: 1000 [IU] via INTRAVENOUS

## 2022-04-11 MED ORDER — FENTANYL CITRATE (PF) 100 MCG/2ML IJ SOLN
INTRAMUSCULAR | Status: AC
  Administered 2022-04-11: 25 ug via INTRAVENOUS
  Filled 2022-04-11: qty 2

## 2022-04-11 MED ORDER — PROPOFOL 10 MG/ML IV BOLUS
INTRAVENOUS | Status: DC | PRN
  Administered 2022-04-11: 20 mg via INTRAVENOUS
  Administered 2022-04-11: 150 mg via INTRAVENOUS
  Administered 2022-04-11: 30 mg via INTRAVENOUS

## 2022-04-11 MED ORDER — POLYETHYLENE GLYCOL 3350 17 G PO PACK: 17.0000 g | PACK | Freq: Every day | ORAL | Status: DC | PRN

## 2022-04-11 MED ORDER — OXYCODONE HCL 5 MG PO TABS
ORAL_TABLET | ORAL | Status: AC
  Administered 2022-04-11: 10 mg via ORAL
  Filled 2022-04-11: qty 2

## 2022-04-11 MED ORDER — DEXMEDETOMIDINE HCL IN NACL 200 MCG/50ML IV SOLN
INTRAVENOUS | Status: DC | PRN
  Administered 2022-04-11: 8 ug via INTRAVENOUS
  Administered 2022-04-11 (×2): 4 ug via INTRAVENOUS

## 2022-04-11 MED ORDER — CEFAZOLIN SODIUM-DEXTROSE 2-4 GM/100ML-% IV SOLN
INTRAVENOUS | Status: AC
  Administered 2022-04-11: 2 g via INTRAVENOUS
  Filled 2022-04-11: qty 100

## 2022-04-11 MED ORDER — HEPARIN SODIUM (PORCINE) 5000 UNIT/ML IJ SOLN
INTRAMUSCULAR | Status: AC
  Filled 2022-04-11: qty 1

## 2022-04-11 MED ORDER — APREPITANT 40 MG PO CAPS
40.0000 mg | ORAL_CAPSULE | Freq: Once | ORAL | Status: AC
  Administered 2022-04-11: 40 mg via ORAL

## 2022-04-11 MED ORDER — SUGAMMADEX SODIUM 500 MG/5ML IV SOLN
INTRAVENOUS | Status: DC | PRN
  Administered 2022-04-11: 400 mg via INTRAVENOUS

## 2022-04-11 MED ORDER — GLYCOPYRROLATE 0.2 MG/ML IJ SOLN
INTRAMUSCULAR | Status: DC | PRN
  Administered 2022-04-11: .2 mg via INTRAVENOUS

## 2022-04-11 MED ORDER — LIDOCAINE HCL (CARDIAC) PF 100 MG/5ML IV SOSY
PREFILLED_SYRINGE | INTRAVENOUS | Status: DC | PRN
  Administered 2022-04-11: 100 mg via INTRAVENOUS

## 2022-04-11 MED ORDER — HEMOSTATIC AGENTS (NO CHARGE) OPTIME
TOPICAL | Status: DC | PRN
  Administered 2022-04-11: 1 via TOPICAL

## 2022-04-11 MED ORDER — ASPIRIN EC 81 MG PO TBEC
81.0000 mg | DELAYED_RELEASE_TABLET | Freq: Every day | ORAL | Status: DC
  Administered 2022-04-12: 81 mg via ORAL
  Filled 2022-04-11: qty 1

## 2022-04-11 MED ORDER — ONDANSETRON HCL 4 MG/2ML IJ SOLN
INTRAMUSCULAR | Status: DC | PRN
  Administered 2022-04-11 (×2): 4 mg via INTRAVENOUS

## 2022-04-11 MED ORDER — MORPHINE SULFATE (PF) 4 MG/ML IV SOLN
INTRAVENOUS | Status: AC
  Filled 2022-04-11: qty 1

## 2022-04-11 MED ORDER — CEFAZOLIN SODIUM-DEXTROSE 2-4 GM/100ML-% IV SOLN
2.0000 g | INTRAVENOUS | Status: AC
  Administered 2022-04-11 (×2): 2 g via INTRAVENOUS

## 2022-04-11 MED ORDER — ESMOLOL HCL 100 MG/10ML IV SOLN
INTRAVENOUS | Status: DC | PRN
  Administered 2022-04-11: 30 mg via INTRAVENOUS
  Administered 2022-04-11: 20 mg via INTRAVENOUS

## 2022-04-11 MED ORDER — ALUM & MAG HYDROXIDE-SIMETH 200-200-20 MG/5ML PO SUSP: 15.0000 mL | ORAL | Status: DC | PRN

## 2022-04-11 MED ORDER — ORAL CARE MOUTH RINSE: 15.0000 mL | OROMUCOSAL | Status: DC | PRN

## 2022-04-11 MED ORDER — MORPHINE SULFATE (PF) 2 MG/ML IV SOLN
2.0000 mg | INTRAVENOUS | Status: DC | PRN
  Administered 2022-04-11: 2 mg via INTRAVENOUS

## 2022-04-11 MED ORDER — OXYCODONE HCL 5 MG PO TABS
ORAL_TABLET | ORAL | Status: AC
  Filled 2022-04-11: qty 2

## 2022-04-11 MED ORDER — PHENOL 1.4 % MT LIQD: 1.0000 | OROMUCOSAL | Status: DC | PRN

## 2022-04-11 MED ORDER — SCOPOLAMINE 1 MG/3DAYS TD PT72
MEDICATED_PATCH | TRANSDERMAL | Status: DC | PRN
  Administered 2022-04-11: 1 via TRANSDERMAL

## 2022-04-11 MED ORDER — MONTELUKAST SODIUM 10 MG PO TABS: 10.0000 mg | ORAL_TABLET | Freq: Every day | ORAL | Status: DC

## 2022-04-11 MED ORDER — LACTATED RINGERS IV SOLN: INTRAVENOUS | Status: DC

## 2022-04-11 MED ORDER — BISACODYL 5 MG PO TBEC: 5.0000 mg | DELAYED_RELEASE_TABLET | Freq: Every day | ORAL | Status: DC | PRN

## 2022-04-11 MED ORDER — PANTOPRAZOLE SODIUM 40 MG IV SOLR: 40.0000 mg | Freq: Every day | INTRAVENOUS | Status: DC

## 2022-04-11 SURGICAL SUPPLY — 67 items
ADH SKN CLS APL DERMABOND .7 (GAUZE/BANDAGES/DRESSINGS) ×1
APL PRP STRL LF DISP 70% ISPRP (MISCELLANEOUS) ×1
BAG DECANTER FOR FLEXI CONT (MISCELLANEOUS) ×1 IMPLANT
BLADE SURG 15 STRL LF DISP TIS (BLADE) ×1 IMPLANT
BLADE SURG 15 STRL SS (BLADE) ×1
BLADE SURG SZ11 CARB STEEL (BLADE) ×1 IMPLANT
BOOT SUTURE AID YELLOW STND (SUTURE) ×2 IMPLANT
BRUSH SCRUB EZ  4% CHG (MISCELLANEOUS) ×1
BRUSH SCRUB EZ 4% CHG (MISCELLANEOUS) ×1 IMPLANT
CHLORAPREP W/TINT 26 (MISCELLANEOUS) ×1 IMPLANT
DERMABOND ADVANCED .7 DNX12 (GAUZE/BANDAGES/DRESSINGS) IMPLANT
DRAPE 3/4 80X56 (DRAPES) ×1 IMPLANT
DRAPE INCISE IOBAN 66X45 STRL (DRAPES) ×1 IMPLANT
DRAPE LAPAROTOMY 100X77 ABD (DRAPES) ×1 IMPLANT
DRESSING SURGICEL FIBRLLR 1X2 (HEMOSTASIS) ×1 IMPLANT
DRSG SURGICEL FIBRILLAR 1X2 (HEMOSTASIS) ×1
ELECT CAUTERY BLADE 6.4 (BLADE) ×1 IMPLANT
ELECT REM PT RETURN 9FT ADLT (ELECTROSURGICAL) ×1
ELECTRODE REM PT RTRN 9FT ADLT (ELECTROSURGICAL) ×1 IMPLANT
GAUZE 4X4 16PLY ~~LOC~~+RFID DBL (SPONGE) ×1 IMPLANT
GLOVE BIO SURGEON STRL SZ7 (GLOVE) ×1 IMPLANT
GLOVE SURG SYN 8.0 (GLOVE) ×1 IMPLANT
GLOVE SURG SYN 8.0 PF PI (GLOVE) ×1 IMPLANT
GOWN STRL REUS W/ TWL LRG LVL3 (GOWN DISPOSABLE) ×2 IMPLANT
GOWN STRL REUS W/ TWL XL LVL3 (GOWN DISPOSABLE) ×1 IMPLANT
GOWN STRL REUS W/TWL LRG LVL3 (GOWN DISPOSABLE) ×2
GOWN STRL REUS W/TWL XL LVL3 (GOWN DISPOSABLE) ×1
GRAFT VASC PATCH XENOSURE 1X14 (Vascular Products) IMPLANT
HOLDER FOLEY CATH W/STRAP (MISCELLANEOUS) ×1 IMPLANT
IV NS 500ML (IV SOLUTION) ×1
IV NS 500ML BAXH (IV SOLUTION) ×1 IMPLANT
KIT TURNOVER KIT A (KITS) ×1 IMPLANT
LABEL OR SOLS (LABEL) ×1 IMPLANT
LOOP RED MAXI  1X406MM (MISCELLANEOUS) ×2
LOOP VESSEL MAXI  1X406 RED (MISCELLANEOUS) ×2
LOOP VESSEL MAXI 1X406 RED (MISCELLANEOUS) ×2 IMPLANT
LOOP VESSEL MINI 0.8X406 BLUE (MISCELLANEOUS) ×1 IMPLANT
LOOPS BLUE MINI 0.8X406MM (MISCELLANEOUS) ×1
MANIFOLD NEPTUNE II (INSTRUMENTS) ×1 IMPLANT
NDL FILTER BLUNT 18X1 1/2 (NEEDLE) ×1 IMPLANT
NDL HYPO 25X1 1.5 SAFETY (NEEDLE) ×1 IMPLANT
NEEDLE FILTER BLUNT 18X1 1/2 (NEEDLE) ×1 IMPLANT
NEEDLE HYPO 25X1 1.5 SAFETY (NEEDLE) ×1 IMPLANT
NS IRRIG 500ML POUR BTL (IV SOLUTION) ×1 IMPLANT
PACK BASIN MAJOR ARMC (MISCELLANEOUS) ×1 IMPLANT
SHUNT CAROTID STR REINF 3.0X4. (MISCELLANEOUS) ×1 IMPLANT
SPONGE T-LAP 18X18 ~~LOC~~+RFID (SPONGE) ×2 IMPLANT
SUT MNCRL+ 5-0 UNDYED PC-3 (SUTURE) ×1 IMPLANT
SUT MONOCRYL 5-0 (SUTURE) ×1
SUT PROLENE 5 0 RB 1 DA (SUTURE) IMPLANT
SUT PROLENE 6 0 BV (SUTURE) ×8 IMPLANT
SUT PROLENE 7 0 BV 1 (SUTURE) ×6 IMPLANT
SUT SILK 2 0 (SUTURE) ×2
SUT SILK 2-0 18XBRD TIE 12 (SUTURE) ×1 IMPLANT
SUT SILK 3 0 (SUTURE) ×2
SUT SILK 3-0 18XBRD TIE 12 (SUTURE) ×1 IMPLANT
SUT SILK 4 0 (SUTURE) ×1
SUT SILK 4-0 18XBRD TIE 12 (SUTURE) ×1 IMPLANT
SUT VIC AB 3-0 SH 27 (SUTURE) ×1
SUT VIC AB 3-0 SH 27X BRD (SUTURE) ×1 IMPLANT
SYR 10ML LL (SYRINGE) ×1 IMPLANT
SYR 20ML LL LF (SYRINGE) ×1 IMPLANT
SYR 3ML LL SCALE MARK (SYRINGE) ×1 IMPLANT
TRAP FLUID SMOKE EVACUATOR (MISCELLANEOUS) ×1 IMPLANT
TRAY FOLEY MTR SLVR 16FR STAT (SET/KITS/TRAYS/PACK) ×1 IMPLANT
TUBING CONNECTING 10 (TUBING) IMPLANT
WATER STERILE IRR 500ML POUR (IV SOLUTION) ×1 IMPLANT

## 2022-04-11 NOTE — Anesthesia Procedure Notes (Signed)
Procedure Name: Intubation Date/Time: 04/11/2022 10:53 AM  Performed by: Kelton Pillar, CRNAPre-anesthesia Checklist: Patient identified, Emergency Drugs available, Suction available and Patient being monitored Patient Re-evaluated:Patient Re-evaluated prior to induction Oxygen Delivery Method: Circle system utilized Preoxygenation: Pre-oxygenation with 100% oxygen Induction Type: IV induction Ventilation: Mask ventilation without difficulty Laryngoscope Size: McGraph and 4 Grade View: Grade I Tube type: Oral Tube size: 7.5 mm Number of attempts: 1 Airway Equipment and Method: Stylet and Oral airway Placement Confirmation: ETT inserted through vocal cords under direct vision, positive ETCO2, breath sounds checked- equal and bilateral and CO2 detector Secured at: 21 cm Tube secured with: Tape Dental Injury: Teeth and Oropharynx as per pre-operative assessment

## 2022-04-11 NOTE — Progress Notes (Signed)
Patient with sbp 90's MAP greater the 65, also patient states "I can't pee" Urine output thus far in pacu 141m, per Dr. ZBertell Mariaanesthesia  may remove scop patch behind right ear and monitor.

## 2022-04-11 NOTE — Progress Notes (Signed)
Dr. Delana Meyer at bedside, ok with sbp in mid 90's.  Patient awake/alert x4. Left neck incision with dermabond c/d/I no s/s hematoma noted. Family updated, dinner ordered.

## 2022-04-11 NOTE — Progress Notes (Signed)
Patient awake and alert to name, place. Made aware surgery complete. Follows comands, hand grasps equal, able to push/bend bil lower ext  Pulses +2. Left neck area with dermabond  c/d/I

## 2022-04-11 NOTE — Anesthesia Preprocedure Evaluation (Signed)
Anesthesia Evaluation  Patient identified by MRN, date of birth, ID band Patient awake    Reviewed: Allergy & Precautions, NPO status , Patient's Chart, lab work & pertinent test results  History of Anesthesia Complications Negative for: history of anesthetic complications  Airway Mallampati: III  TM Distance: >3 FB Neck ROM: full    Dental  (+) Chipped, Dental Advidsory Given   Pulmonary neg shortness of breath, COPD, former smoker   Pulmonary exam normal        Cardiovascular hypertension, On Medications (-) angina + CAD and + Peripheral Vascular Disease  Normal cardiovascular exam     Neuro/Psych  PSYCHIATRIC DISORDERS      TIA   GI/Hepatic Neg liver ROS,GERD  ,,  Endo/Other  negative endocrine ROS    Renal/GU      Musculoskeletal   Abdominal   Peds  Hematology negative hematology ROS (+)   Anesthesia Other Findings Past Medical History: No date: Allergic rhinitis, cause unspecified No date: Allergy No date: Anxiety No date: [facility])     Comment:  Basal cell skin cancer,left ear No date: Carotid artery stenosis     Comment:  left No date: Carpal tunnel syndrome No date: Chronic airway obstruction, not elsewhere classified 10/02/2007: COPD with acute exacerbation ([place])     Comment:  Qualifier: Diagnosis of  By: [name] MD, Clinton D  No date: Coronary artery disease No date: Depression No date: Dyspnea No date: Esophageal reflux No date: Glaucoma (increased eye pressure) No date: History of kidney stones No date: Lipoma of unspecified site No date: Osteoarthrosis, unspecified whether generalized or localized,  lower leg No date: Other and unspecified hyperlipidemia No date: Peripheral vascular disease ([place]) No date: Personal history of colonic polyps No date: Personal history of other malignant neoplasm of skin No date: PONV (postoperative nausea and vomiting) No date: Stroke [facility])     Comment:   " mini stroke" No date: Unspecified essential hypertension 09/20/2021: Viral gastroenteritis  Past Surgical History: No date: BACK SURGERY     Comment:  lumbar disc surgery 06/2003: bone spur     Comment:  right thumb 01/23/2022: CAROTID ANGIOGRAPHY; Left     Comment:  Procedure: CAROTID ANGIOGRAPHY;  Surgeon: [name], MD;  Location: [facility];  Service:              Cardiovascular;  Laterality: Left; No date: CATARACT EXTRACTION No date: COLONOSCOPY W/ POLYPECTOMY No date: COLONOSCOPY WITH ESOPHAGOGASTRODUODENOSCOPY (EGD) 09/2007: KNEE ARTHROSCOPY     Comment:  partial medial mesicecotmy, patellar chonfroplasty 03/24/2007: SHOULDER SURGERY     Comment:  removal bone spur 07/2016: SHOULDER SURGERY; Left     Comment:  [facility], Dr. Harmon Pier No date: TOTAL KNEE ARTHROPLASTY     Comment:  03-2009 [place] 12/2012: TOTAL KNEE ARTHROPLASTY; Left     Comment:  [place]  BMI    Body Mass Index: 32.55 kg/m      Reproductive/Obstetrics negative OB ROS                             Anesthesia Physical Anesthesia Plan  ASA: 3  Anesthesia Plan: General ETT   Post-op Pain Management:    Induction: Intravenous  PONV Risk Score and Plan: 2 and Midazolam, Dexamethasone and Ondansetron  Airway Management Planned: Oral ETT  Additional Equipment: Arterial line  Intra-op  Plan:   Post-operative Plan: Extubation in OR  Informed Consent: I have reviewed the patients History and Physical, chart, labs and discussed the procedure including the risks, benefits and alternatives for the proposed anesthesia with the patient or authorized representative who has indicated his/her understanding and acceptance.     Dental Advisory Given  Plan Discussed with: Anesthesiologist, CRNA and Surgeon  Anesthesia Plan Comments: (Patient consented for risks of anesthesia including but not limited to:  - adverse reactions  to medications - damage to eyes, teeth, lips or other oral mucosa - nerve damage due to positioning  - sore throat or hoarseness - Damage to heart, brain, nerves, lungs, other parts of body or loss of life  Patient voiced understanding.)       Anesthesia Quick Evaluation

## 2022-04-11 NOTE — Op Note (Signed)
[facility]   OPERATIVE NOTE  PROCEDURE:   1.  Left carotid endarterectomy with Bovine pericardial arterial patch reconstruction  PRE-OPERATIVE DIAGNOSIS: 1.  Critical left carotid stenosis 2.  Multiple TIAs  POST-OPERATIVE DIAGNOSIS: same as above   SURGEON: [name], MD  ASSISTANT(S): Annalee Genta, NP  ANESTHESIA: general  ESTIMATED BLOOD LOSS: 150 cc  FINDING(S): 1.  Extensive calcified carotid plaque.  SPECIMEN(S):  Carotid plaque (sent to Pathology)  INDICATIONS:   Alan [name] is a 77 y.o. y.o. male who presents with greater than 95% stenosis of the left internal carotid artery.  The left internal carotid artery has an atretic appearance on angiography and he was not a candidate for stenting.  The risks, benefits, and alternatives to carotid endarterectomy were discussed with the patient. The differences between carotid stenting and carotid endarterectomy were reviewed.  The patient voiced understanding and appears to be aware that the risks of carotid endarterectomy include but are not limited to: bleeding, infection, stroke, myocardial infarction, death, cranial nerve injuries both temporary and permanent, neck hematoma, possible airway compromise, labile blood pressure post-operatively, cerebral hyperperfusion syndrome, and possible need for additional interventions in the future. The patient is aware of the risks and agrees to proceed forward with the procedure.  DESCRIPTION: After full informed written consent was obtained from the patient, the patient was brought back to the operating room and placed supine upon the operating table.  Prior to induction, the patient received IV antibiotics.  After obtaining adequate anesthesia, the patient was placed a supine position with a shoulder roll in place and the patient's neck slightly hyperextended and rotated away from the surgical site.  The patient was prepped in the standard fashion for a  carotid endarterectomy.    A first assistant was required to provide a safe and appropriate environment for executing the surgery.  The assistant was integral in providing retraction, exposure, running suture providing suction and in the closing process.  The incision was made anterior to the sternocleidomastoid muscle and dissected down through the subcutaneous tissue.  The platysmas was opened with electrocautery.  The internal jugular vein and facial vein were identified.  The facial vein is ligated and divided between 2-0 silk ties.  The omohyoid was identified in the common carotid artery exposed at this level. The dissection was there in carried out along the carotid artery in a cranial direction.  The dissection was then carried along periadventitial plane along the common carotid artery up to the bifurcation. The external carotid artery was identified. Vessel loops were then placed around the external carotid artery as well as the superior thyroid artery. In the process of this dissection, the hypoglossal nerve was identified and protected from harm.  The internal carotid artery was then dissected circumferentially just beyond an area in the internal carotid artery distal to the plaque.    At this point, we gave the patient 9000 units of intravenous heparin.  After this was allowed to circulate for several minutes, the common carotid followed by the external carotid and then the internal carotid artery were clamped.  Arteriotomy was made in the common carotid artery with a 11 blade, and extended the arteriotomy with a Potts scissor down into the common carotid artery, then the arteriotomy was carried through the bifurcation into the internal carotid artery until I reached an area that was not diseased.  At this point, a Sundt shunt was placed.  The endarterectomy was begun in the common  carotid artery with a Garment/textile technologist and carried this dissection down into the common carotid artery  circumferentially.  Then I transected the plaque at a segment where it was adherent and transected the plaque with Potts scissors.  I then carried this dissection up into the external carotid artery.  The plaque was extracted by unclamping the external carotid artery and performing an eversion endarterectomy.  The dissection was then carried into the internal carotid artery where a  feathered end point was created.  The plaque was passed off the field as a specimen.  The distal endpoint was tacked down with 6 interrupted 7-0 Prolene sutures.  A CorMatrix arterial patch was delivered onto the field and trimmed appropriately for the artery and sewed in place with 6-0 Prolene using a 4 quadrant technique.  The medial suture line was completed and the lateral suture line was run approximately one quarter the length of the arteriotomy.  Prior to completing this patch angioplasty, the shunt was removed, the internal carotid artery was flushed and there was excellent backbleeding.  The carotid artery repair was flushed with heparinized saline and then the patch angioplasty was completed in the usual fashion.  The flow was then reestablished first to the external carotid artery and then the internal carotid artery to prevent distal embolization.   Several minutes of pressure were held and 6-0 Prolene patch sutures were used as need for hemostasis.  At this point, I placed Surgicel and Evicel topical hemostatic agents.  There was no more active bleeding in the surgical site.  The sternocleidomastoid space was closed with three interrupted 3-0 Vicryl sutures. I then reapproximated the platysma muscle with a running stitch of 3-0 Vicryl.  The skin was then closed with a running subcuticular 4-0 Monocryl.  The skin was then cleaned, dried and Dermabond was used to reinforce the skin closure.  The patient awakened and was taken to the recovery room in stable condition, following commands and moving all four extremities without  any apparent deficits.    COMPLICATIONS: none  CONDITION: stable  Alan [name] 04/11/2022<2:26 PM

## 2022-04-11 NOTE — Transfer of Care (Signed)
Immediate Anesthesia Transfer of Care Note  Patient: Alan [name]  Procedure(s) Performed: ENDARTERECTOMY CAROTID (Left)  Patient Location: PACU  Anesthesia Type:General  Level of Consciousness: awake, drowsy, and patient cooperative  Airway & Oxygen Therapy: Patient Spontanous Breathing and Patient connected to face mask oxygen  Post-op Assessment: Report given to RN and Post -op Vital signs reviewed and stable  Post vital signs: Reviewed and stable  Last Vitals:  Vitals Value Taken Time  BP 97/73 04/11/22 1449  Temp    Pulse 61 04/11/22 1454  Resp 12 04/11/22 1450  SpO2 93 % 04/11/22 1454  Vitals shown include unvalidated device data.  Last Pain:  Vitals:   04/11/22 0841  TempSrc: Temporal  PainSc: 0-No pain         Complications: No notable events documented.

## 2022-04-11 NOTE — Progress Notes (Signed)
Patient awake/alert x4.  Upon arrival to pacu, left radial a-line removed without event, pulses intact no s/s hematoma noted. Tolerated. Left neck with surgery site dermabond c/d/I  no s/s hematoma noted at incision site.  Neuro intact. Tolerated sips with po meds.

## 2022-04-11 NOTE — Interval H&P Note (Signed)
History and Physical Interval Note:  04/11/2022 10:11 AM  Alan [name]  has presented today for surgery, with the diagnosis of CAROTID ARTERY STENOSIS.  The various methods of treatment have been discussed with the patient and family. After consideration of risks, benefits and other options for treatment, the patient has consented to  Procedure(s): ENDARTERECTOMY CAROTID (Left) as a surgical intervention.  The patient's history has been reviewed, patient examined, no change in status, stable for surgery.  I have reviewed the patient's chart and labs.  Questions were answered to the patient's satisfaction.     Hortencia Pilar

## 2022-04-12 ENCOUNTER — Encounter: Payer: Self-pay | Admitting: Vascular Surgery

## 2022-04-12 LAB — BASIC METABOLIC PANEL
Anion gap: 7 (ref 5–15)
BUN: 22 mg/dL (ref 8–23)
CO2: 25 mmol/L (ref 22–32)
Calcium: 8.1 mg/dL — ABNORMAL LOW (ref 8.9–10.3)
Chloride: 107 mmol/L (ref 98–111)
Creatinine, Ser: 0.88 mg/dL (ref 0.61–1.24)
GFR, Estimated: >60 mL/min (ref >60)
Glucose, Bld: 134 mg/dL — ABNORMAL HIGH (ref 70–99)
Potassium: 4.3 mmol/L (ref 3.5–5.1)
Sodium: 139 mmol/L (ref 135–145)

## 2022-04-12 LAB — CBC
HCT: 33.2 % — ABNORMAL LOW (ref 39.0–52.0)
Hemoglobin: 10.8 g/dL — ABNORMAL LOW (ref 13.0–17.0)
MCH: 28.8 pg (ref 26.0–34.0)
MCHC: 32.5 g/dL (ref 30.0–36.0)
MCV: 88.5 fL (ref 80.0–100.0)
Platelets: 127 10*3/uL — ABNORMAL LOW (ref 150–400)
RBC: 3.75 MIL/uL — ABNORMAL LOW (ref 4.22–5.81)
RDW: 12.4 % (ref 11.5–15.5)
WBC: 9.8 10*3/uL (ref 4.0–10.5)
nRBC: 0 % (ref 0.0–0.2)

## 2022-04-12 MED ORDER — DOCUSATE SODIUM 100 MG PO CAPS
ORAL_CAPSULE | ORAL | Status: AC
  Administered 2022-04-12: 100 mg via ORAL
  Filled 2022-04-12: qty 1

## 2022-04-12 MED ORDER — ONDANSETRON HCL 4 MG/2ML IJ SOLN
INTRAMUSCULAR | Status: AC
  Filled 2022-04-12: qty 2

## 2022-04-12 MED ORDER — SERTRALINE HCL 50 MG PO TABS
ORAL_TABLET | ORAL | Status: AC
  Administered 2022-04-12: 100 mg via ORAL
  Filled 2022-04-12: qty 2

## 2022-04-12 MED ORDER — ACETAMINOPHEN 325 MG PO TABS
ORAL_TABLET | ORAL | Status: AC
  Administered 2022-04-12: 650 mg via ORAL
  Filled 2022-04-12: qty 2

## 2022-04-12 MED ORDER — BISACODYL 5 MG PO TBEC
DELAYED_RELEASE_TABLET | ORAL | Status: AC
  Administered 2022-04-12: 5 mg via ORAL
  Filled 2022-04-12: qty 1

## 2022-04-12 MED ORDER — OXYCODONE HCL 5 MG PO TABS: 5.0000 mg | ORAL_TABLET | Freq: Four times a day (QID) | ORAL | 0 refills | Status: DC | PRN

## 2022-04-12 MED ORDER — ASPIRIN 81 MG PO TBEC: 81.0000 mg | DELAYED_RELEASE_TABLET | Freq: Every day | ORAL | 11 refills | Status: AC

## 2022-04-12 MED ORDER — ATORVASTATIN CALCIUM 20 MG PO TABS
ORAL_TABLET | ORAL | Status: AC
  Administered 2022-04-12: 80 mg via ORAL
  Filled 2022-04-12: qty 4

## 2022-04-12 MED ORDER — ALUM & MAG HYDROXIDE-SIMETH 200-200-20 MG/5ML PO SUSP
ORAL | Status: AC
  Administered 2022-04-12: 30 mL via ORAL
  Filled 2022-04-12: qty 30

## 2022-04-12 MED ORDER — ACETAMINOPHEN 325 MG PO TABS
ORAL_TABLET | ORAL | Status: AC
  Filled 2022-04-12: qty 2

## 2022-04-12 MED ORDER — CEFAZOLIN SODIUM-DEXTROSE 2-4 GM/100ML-% IV SOLN
INTRAVENOUS | Status: AC
  Administered 2022-04-12: 2 g via INTRAVENOUS
  Filled 2022-04-12: qty 100

## 2022-04-12 NOTE — Anesthesia Postprocedure Evaluation (Signed)
Anesthesia Post Note  Patient: Alan [name]  Procedure(s) Performed: ENDARTERECTOMY CAROTID (Left)  Patient location during evaluation: Other Anesthesia Type: General Level of consciousness: awake, awake and alert and oriented Pain management: pain level controlled Vital Signs Assessment: post-procedure vital signs reviewed and stable Respiratory status: spontaneous breathing Cardiovascular status: stable Anesthetic complications: no   No notable events documented.   Last Vitals:  Vitals:   04/12/22 0521 04/12/22 0730  BP: 118/76   Pulse: (!) 53 61  Resp:    Temp: 36.7 C   SpO2: 99% 97%    Last Pain:  Vitals:   04/12/22 0730  TempSrc:   PainSc: 4                  Ilijah Doucet,  Cristen Murcia R

## 2022-04-12 NOTE — Discharge Summary (Signed)
[facility]    Discharge Summary    Patient ID:  Alan [name] MRN: [id number] DOB/AGE: 06-03-1945 77 y.o.  Admit date: 04/11/2022 Discharge date: 04/12/2022 Date of Surgery: 04/11/2022 Surgeon: Surgeon(s): [name], [name], MD  Admission Diagnosis: Carotid stenosis, symptomatic w/o infarct, left [I65.22]  Discharge Diagnoses:  Carotid stenosis, symptomatic w/o infarct, left [I65.22]  Secondary Diagnoses: Past Medical History:  Diagnosis Date   Allergic rhinitis, cause unspecified    Allergy    Anxiety    Cancer ([place])    Basal cell skin cancer,left ear   Carotid artery stenosis    left   Carpal tunnel syndrome    Chronic airway obstruction, not elsewhere classified    COPD with acute exacerbation ([place]) 10/02/2007   Qualifier: Diagnosis of  By: [name] MD, [place] D    Coronary artery disease    Depression    Dyspnea    Esophageal reflux    Glaucoma (increased eye pressure)    History of kidney stones    Lipoma of unspecified site    Osteoarthrosis, unspecified whether generalized or localized, lower leg    Other and unspecified hyperlipidemia    Peripheral vascular disease ([place])    Personal history of colonic polyps    Personal history of other malignant neoplasm of skin    PONV (postoperative nausea and vomiting)    Stroke [facility])    " mini stroke"   Unspecified essential hypertension    Viral gastroenteritis 09/20/2021    Procedure(s): ENDARTERECTOMY CAROTID  Discharged Condition: good  HPI:  Alan [name] is a 77 year old male who is now status postop day 1 from a left carotid endarterectomy.  He is recovering as expected.  Morning upon evaluation he was sitting in the bedside chair resting comfortably.  Patient states he is having very little pain.  Patient says his pain is being covered by Tylenol.  No complications to note.  Patient's vitals all remained stable.      Hospital Course:  Alan [name] is a 77 y.o.  male is S/P Left Carotid Endarterectomy  Extubated: POD # 0 Physical Exam:  Alert notes x3, no acute distress Face: Symmetrical.  Tongue is midline. Neck: Trachea is midline.  No swelling or bruising.Incision closed with dermabond. No signs or symptoms of infection. No hematoma or seroma to note.  Cardiovascular: Regular rate and rhythm Pulmonary: Clear to auscultation bilaterally Abdomen: Soft, nontender, nondistended Neurological: No deficits noted   Post-op wounds: Healing well: Leave left neck incision open to air.  Incision was closed with Dermabond.  May shower tomorrow but do not stand directly in the stream of water when showering.  When done showering pat incision line very dry.  Pt. Ambulating, voiding and taking PO diet without difficulty. Pt pain controlled with PO pain meds. PATIENT TO RESTART HOME BP MEDS TOMORROW!   Labs:  As below  Complications: none  Consults:    Significant Diagnostic Studies: CBC Lab Results  Component Value Date   WBC 9.8 04/12/2022   HGB 10.8 (L) 04/12/2022   HCT 33.2 (L) 04/12/2022   MCV 88.5 04/12/2022   PLT 127 (L) 04/12/2022    BMET    Component Value Date/Time   NA 139 04/12/2022 0513   NA 136 01/16/2013 0616   K 4.3 04/12/2022 0513   K 3.5 01/16/2013 0616   CL 107 04/12/2022 0513   CL 105 01/16/2013 0616   CO2 25 04/12/2022 0513   CO2 26 01/16/2013 0616  GLUCOSE 134 (H) 04/12/2022 0513   GLUCOSE 126 (H) 01/16/2013 0616   BUN 22 04/12/2022 0513   BUN 10 01/16/2013 0616   CREATININE 0.88 04/12/2022 0513   CREATININE 0.69 01/16/2013 0616   CALCIUM 8.1 (L) 04/12/2022 0513   CALCIUM 9.0 01/16/2013 0616   GFRNONAA >60 04/12/2022 0513   GFRNONAA >60 01/16/2013 0616   GFRAA >60 01/16/2013 0616   COAG Lab Results  Component Value Date   INR 1.0 05/30/2021   INR 1.0 11/07/2020   INR 1.0 12/24/2012     Disposition:  Discharge to :Home  Allergies as of 04/12/2022   No Known Allergies      Medication List      TAKE these medications    acetaminophen 500 MG tablet Commonly known as: TYLENOL Take 1,000 mg by mouth every 6 (six) hours as needed.   amLODipine 10 MG tablet Commonly known as: NORVASC TAKE 1 TABLET BY MOUTH EVERY DAY FOR BLOOD PRESSURE   aspirin EC 81 MG tablet Take 1 tablet (81 mg total) by mouth daily at 6 (six) AM. Swallow whole. Start taking on: April 13, 2022   atorvastatin 80 MG tablet Commonly known as: LIPITOR TAKE 1 TABLET BY MOUTH EVERY DAY for cholesterol.   clopidogrel 75 MG tablet Commonly known as: PLAVIX TAKE 1 TABLET BY MOUTH EVERY DAY   fish oil-omega-3 fatty acids 1000 MG capsule Take 2 g by mouth daily.   ketoconazole 2 % shampoo Commonly known as: NIZORAL as needed.   losartan 100 MG tablet Commonly known as: COZAAR TAKE 1 TABLET BY MOUTH EVERY DAY FOR BLOOD PRESSURE   montelukast 10 MG tablet Commonly known as: SINGULAIR TAKE 1 TABLET (10 MG TOTAL) BY MOUTH AT BEDTIME FOR ALLERGIES   multivitamin tablet Take 1 tablet by mouth daily.   oxyCODONE 5 MG immediate release tablet Commonly known as: Oxy IR/ROXICODONE Take 1 tablet (5 mg total) by mouth every 6 (six) hours as needed for moderate pain.   pantoprazole 40 MG tablet Commonly known as: PROTONIX TAKE 1 TABLET BY MOUTH DAILY FOR HEARTBURN.   sertraline 100 MG tablet Commonly known as: ZOLOFT TAKE 1 TABLET (100 MG TOTAL) BY MOUTH DAILY. FOR ANXIETY AND DEPRESSION.       Verbal and written Discharge instructions given to the patient. Wound care per Discharge AVS  Follow-up Information     [name], [name], MD Follow up in 1 month(s).   Specialties: Vascular Surgery, Cardiology, Radiology, Vascular Surgery Why: Carotid Duplex ultrasounds Contact information: [address] [place] [address] [phone]                 Signed: Drema Pry, NP  04/12/2022, 12:57 PM

## 2022-04-12 NOTE — Discharge Instructions (Signed)
Do not take blood pressure medication today, resume tomorrow

## 2022-04-13 ENCOUNTER — Telehealth (INDEPENDENT_AMBULATORY_CARE_PROVIDER_SITE_OTHER): Payer: Self-pay

## 2022-04-13 ENCOUNTER — Telehealth: Payer: Self-pay

## 2022-04-13 LAB — SURGICAL PATHOLOGY

## 2022-04-13 NOTE — Patient Outreach (Signed)
  Care Coordination [facility] Note Transition Care Management Unsuccessful Follow-up Telephone Call  Date of discharge and from where:  [facility] 04/11/22  Attempts:  1st Attempt  Reason for unsuccessful TCM follow-up call:  Left voice message  Johnney Killian, RN, BSN, CCM Care Management Coordinator [facility] Phone: [phone]: [phone]

## 2022-04-15 NOTE — Telephone Encounter (Signed)
He can shower today

## 2022-04-16 ENCOUNTER — Telehealth: Payer: Self-pay

## 2022-04-16 ENCOUNTER — Other Ambulatory Visit (INDEPENDENT_AMBULATORY_CARE_PROVIDER_SITE_OTHER): Payer: Medicare HMO

## 2022-04-16 DIAGNOSIS — D696 Thrombocytopenia, unspecified: Secondary | ICD-10-CM | POA: Diagnosis not present

## 2022-04-16 LAB — CBC WITH DIFFERENTIAL/PLATELET
Basophils Absolute: 0 10*3/uL (ref 0.0–0.1)
Basophils Relative: 0.4 % (ref 0.0–3.0)
Eosinophils Absolute: 0.4 10*3/uL (ref 0.0–0.7)
Eosinophils Relative: 6.2 % — ABNORMAL HIGH (ref 0.0–5.0)
HCT: 37.8 % — ABNORMAL LOW (ref 39.0–52.0)
Hemoglobin: 12.6 g/dL — ABNORMAL LOW (ref 13.0–17.0)
Lymphocytes Relative: 23.9 % (ref 12.0–46.0)
Lymphs Abs: 1.5 10*3/uL (ref 0.7–4.0)
MCHC: 33.4 g/dL (ref 30.0–36.0)
MCV: 88.8 fl (ref 78.0–100.0)
Monocytes Absolute: 0.6 10*3/uL (ref 0.1–1.0)
Monocytes Relative: 8.8 % (ref 3.0–12.0)
Neutro Abs: 3.9 10*3/uL (ref 1.4–7.7)
Neutrophils Relative %: 60.7 % (ref 43.0–77.0)
Platelets: 145 10*3/uL — ABNORMAL LOW (ref 150.0–400.0)
RBC: 4.25 Mil/uL (ref 4.22–5.81)
RDW: 13.2 % (ref 11.5–15.5)
WBC: 6.5 10*3/uL (ref 4.0–10.5)

## 2022-04-16 LAB — IBC + FERRITIN
Ferritin: 27.4 ng/mL (ref 22.0–322.0)
Iron: 43 ug/dL (ref 42–165)
Saturation Ratios: 12.9 % — ABNORMAL LOW (ref 20.0–50.0)
TIBC: 334.6 ug/dL (ref 250.0–450.0)
Transferrin: 239 mg/dL (ref 212.0–360.0)

## 2022-04-16 NOTE — Telephone Encounter (Signed)
Patient made aware with medical recommendations and verbalized understanding 

## 2022-04-16 NOTE — Telephone Encounter (Signed)
Left a message on patient voicemail to return call to the office 

## 2022-04-16 NOTE — Patient Outreach (Signed)
  Care Coordination [facility] Note Transition Care Management Unsuccessful Follow-up Telephone Call  Date of discharge and from where:  [facility] 04/11/22  Attempts:  1st Attempt  Reason for unsuccessful TCM follow-up call:  Left voice message  Johnney Killian, RN, BSN, CCM Care Management Coordinator [facility] Phone: [phone]: [phone]

## 2022-04-17 ENCOUNTER — Telehealth: Payer: Self-pay

## 2022-04-17 NOTE — Patient Outreach (Signed)
  Care Coordination [facility] Note Transition Care Management Unsuccessful Follow-up Telephone Call  Date of discharge and from where:  [facility] 04/11/22  Attempts:  3rd Attempt  Reason for unsuccessful TCM follow-up call:  No answer/busy  Johnney Killian, RN, BSN, CCM Care Management Coordinator [facility] Phone: [phone]: [phone]

## 2022-05-01 ENCOUNTER — Encounter: Payer: Self-pay | Admitting: Vascular Surgery

## 2022-05-04 DIAGNOSIS — H34212 Partial retinal artery occlusion, left eye: Secondary | ICD-10-CM | POA: Diagnosis not present

## 2022-05-04 DIAGNOSIS — Z961 Presence of intraocular lens: Secondary | ICD-10-CM | POA: Diagnosis not present

## 2022-05-04 DIAGNOSIS — H40013 Open angle with borderline findings, low risk, bilateral: Secondary | ICD-10-CM | POA: Diagnosis not present

## 2022-05-04 DIAGNOSIS — H43813 Vitreous degeneration, bilateral: Secondary | ICD-10-CM | POA: Diagnosis not present

## 2022-05-04 DIAGNOSIS — G453 Amaurosis fugax: Secondary | ICD-10-CM | POA: Diagnosis not present

## 2022-05-08 ENCOUNTER — Other Ambulatory Visit (INDEPENDENT_AMBULATORY_CARE_PROVIDER_SITE_OTHER): Payer: Self-pay | Admitting: Vascular Surgery

## 2022-05-08 DIAGNOSIS — I6522 Occlusion and stenosis of left carotid artery: Secondary | ICD-10-CM

## 2022-05-11 NOTE — Progress Notes (Signed)
      Patient appointment canceled   Hortencia Pilar, MD  05/11/2022 12:30 PM

## 2022-05-14 ENCOUNTER — Ambulatory Visit (INDEPENDENT_AMBULATORY_CARE_PROVIDER_SITE_OTHER): Payer: Medicare HMO

## 2022-05-14 ENCOUNTER — Ambulatory Visit (INDEPENDENT_AMBULATORY_CARE_PROVIDER_SITE_OTHER): Payer: Medicare HMO | Admitting: Vascular Surgery

## 2022-05-14 DIAGNOSIS — I6522 Occlusion and stenosis of left carotid artery: Secondary | ICD-10-CM

## 2022-07-05 ENCOUNTER — Ambulatory Visit (INDEPENDENT_AMBULATORY_CARE_PROVIDER_SITE_OTHER): Payer: Medicare HMO | Admitting: Primary Care

## 2022-07-05 ENCOUNTER — Encounter: Payer: Self-pay | Admitting: Primary Care

## 2022-07-05 VITALS — BP 114/78 | HR 64 | Temp 98.8°F | Ht 72.0 in | Wt 246.0 lb

## 2022-07-05 DIAGNOSIS — M542 Cervicalgia: Secondary | ICD-10-CM | POA: Diagnosis not present

## 2022-07-05 DIAGNOSIS — G8929 Other chronic pain: Secondary | ICD-10-CM

## 2022-07-05 DIAGNOSIS — H9202 Otalgia, left ear: Secondary | ICD-10-CM | POA: Diagnosis not present

## 2022-07-05 MED ORDER — FLUTICASONE PROPIONATE 50 MCG/ACT NA SUSP: 1.0000 | Freq: Two times a day (BID) | NASAL | 0 refills | Status: DC

## 2022-07-05 MED ORDER — CYCLOBENZAPRINE HCL 5 MG PO TABS: 5.0000 mg | ORAL_TABLET | Freq: Three times a day (TID) | ORAL | 0 refills | Status: DC | PRN

## 2022-07-05 NOTE — Assessment & Plan Note (Signed)
No infection or impaction.  Likely secondary to allergies. Continue Singulair 10 mg HS. Start Flonase BID. Rx sent to pharmacy.  Follow up PRN.

## 2022-07-05 NOTE — Assessment & Plan Note (Signed)
Secondary to osteoarthritis with what seems to be a muscle spasm.  Start cyclobenzaprine 5 mg TID PRN, drowsiness precautions provided.  Discussed use of Tylenol and heat, stretching.

## 2022-07-05 NOTE — Progress Notes (Signed)
Subjective:    [name], male    DOB: 02-18-1946, 77 y.o.   MRN: [id number]  HPI  [name] is a very pleasant 77 y.o. male with a history of hypertension, CAD, PAD, PVD, carotid stenosis, GAD, hyperlipidemia, prediabetes, frequent headaches, CVA  who presents today to discuss otalgia and muscle spasm.   Acute for the last three days and located to the left ear. He's also noticed ear fullness and internal swelling. He's applied peroxide twice without improvement.   He denies fevers, chills, body aches, post nasal drip. He resumed Singulair 10 mg two evenings ago.   Over the last 4 months he's noticed muscle spasm and tightness to the right lateral neck throughout the right anterior shoulder. He changed his mattress about that time, has also changed pillows several times since then. He denies injury/trauma, numbness/tingling. He's tried Tylenol and heat without much improvement.    Review of Systems  Constitutional:  Negative for chills and fever.  HENT:  Positive for ear pain. Negative for ear discharge, postnasal drip and sore throat.   Respiratory:  Negative for cough.   Musculoskeletal:  Positive for arthralgias and myalgias.         Past Medical History:  Diagnosis Date   Allergic rhinitis, cause unspecified    Allergy    Anxiety    Cancer    Basal cell skin cancer,left ear   Carotid artery stenosis    left   Carpal tunnel syndrome    Chronic airway obstruction, not elsewhere classified    COPD with acute exacerbation 10/02/2007   Qualifier: Diagnosis of  By: [name] MD, Clinton D    Coronary artery disease    Depression    Dyspnea    Esophageal reflux    Glaucoma (increased eye pressure)    History of kidney stones    Lipoma of unspecified site    Osteoarthrosis, unspecified whether generalized or localized, lower leg    Other and unspecified hyperlipidemia    Peripheral vascular disease    Personal history of colonic polyps     Personal history of other malignant neoplasm of skin    PONV (postoperative nausea and vomiting)    Stroke    " mini stroke"   Unspecified essential hypertension    Viral gastroenteritis 09/20/2021    Social History   Socioeconomic History   Marital status: Divorced    Spouse name: Not on file   Number of children: 2   Years of education: Not on file   Highest education level: Not on file  Occupational History   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  Tobacco Use   Smoking status: Former    Packs/day: 3.00    Years: 25.00    Additional pack years: 0.00    Total pack years: 75.00    Types: Cigarettes    Quit date: 11/23/1996    Years since quitting: 25.6   Smokeless tobacco: Never  Vaping Use   Vaping Use: Never used  Substance and Sexual Activity   Alcohol use: Not Currently    Alcohol/week: 0.0 standard drinks of alcohol   Drug use: No   Sexual activity: Yes    Partners: Female    Comment: monogamous relationship  Other Topics Concern   Not on file  Social History Narrative   Regular exercise -- no            Social Determinants of Health   Financial Resource Strain: Low Risk  (08/08/2021)  Overall Financial Resource Strain (CARDIA)    Difficulty of Paying Living Expenses: Not hard at all  Food Insecurity: No Food Insecurity (04/11/2022)   Hunger Vital Sign    Worried About Running Out of Food in the Last Year: Never true    Ran Out of Food in the Last Year: Never true  Transportation Needs: No Transportation Needs (04/11/2022)   PRAPARE - Hydrologist (Medical): No    Lack of Transportation (Non-Medical): No  Physical Activity: Insufficiently Active (08/08/2021)   Exercise Vital Sign    Days of Exercise per Week: 2 days    Minutes of Exercise per Session: 20 min  Stress: No Stress Concern Present (08/08/2021)   [place]    Feeling of Stress : Not at all  Social  Connections: Moderately Isolated (08/08/2021)   Social Connection and Isolation Panel [NHANES]    Frequency of Communication with Friends and Family: Twice a week    Frequency of Social Gatherings with Friends and Family: More than three times a week    Attends Religious Services: More than 4 times per year    Active Member of [employer] or Organizations: No    Attends Archivist Meetings: Never    Marital Status: Divorced  Human resources officer Violence: Not At Risk (04/11/2022)   Humiliation, Afraid, Rape, and Kick questionnaire    Fear of Current or Ex-Partner: No    Emotionally Abused: No    Physically Abused: No    Sexually Abused: No    Past Surgical History:  Procedure Laterality Date   BACK SURGERY     lumbar disc surgery   bone spur  06/2003   right thumb   CAROTID ANGIOGRAPHY Left 01/23/2022   Procedure: CAROTID ANGIOGRAPHY;  Surgeon: [name], MD;  Location: [facility];  Service: Cardiovascular;  Laterality: Left;   CATARACT EXTRACTION     COLONOSCOPY W/ POLYPECTOMY     COLONOSCOPY WITH ESOPHAGOGASTRODUODENOSCOPY (EGD)     ENDARTERECTOMY Left 04/11/2022   Procedure: ENDARTERECTOMY CAROTID;  Surgeon: [name], MD;  Location: [facility];  Service: Vascular;  Laterality: Left;   KNEE ARTHROSCOPY  09/2007   partial medial mesicecotmy, patellar chonfroplasty   SHOULDER SURGERY  03/24/2007   removal bone spur   SHOULDER SURGERY Left 07/2016   [facility], Dr. Harmon Pier   TOTAL KNEE ARTHROPLASTY     03-2009 [facility]   TOTAL KNEE ARTHROPLASTY Left 12/2012   [facility]    Family History  Problem Relation Age of Onset   Diabetes Mother    Hyperlipidemia Mother    Hypertension Mother    Heart attack Mother    Obesity Mother    Hyperlipidemia Father    Hypertension Father    Lung cancer Father    Colon polyps Father    Hypertension Sister    Hyperlipidemia Sister    Lung cancer Sister    Hyperlipidemia Sister    Heart attack  Brother    Diabetes Brother    Hyperlipidemia Brother    Hypertension Brother    Hyperlipidemia Brother    Hypertension Brother    Drug abuse Daughter    Colon cancer Neg Hx    Esophageal cancer Neg Hx    Pancreatic cancer Neg Hx    Rectal cancer Neg Hx     No Known Allergies  Current Outpatient Medications on File Prior to Visit  Medication Sig Dispense  Refill   acetaminophen (TYLENOL) 500 MG tablet Take 1,000 mg by mouth every 6 (six) hours as needed.     amLODipine (NORVASC) 10 MG tablet TAKE 1 TABLET BY MOUTH EVERY DAY FOR BLOOD PRESSURE 90 tablet 3   aspirin EC 81 MG tablet Take 1 tablet (81 mg total) by mouth daily at 6 (six) AM. Swallow whole. 30 tablet 11   atorvastatin (LIPITOR) 80 MG tablet TAKE 1 TABLET BY MOUTH EVERY DAY for cholesterol. 90 tablet 2   clopidogrel (PLAVIX) 75 MG tablet TAKE 1 TABLET BY MOUTH EVERY DAY 90 tablet 2   fish oil-omega-3 fatty acids 1000 MG capsule Take 2 g by mouth daily.     ketoconazole (NIZORAL) 2 % shampoo as needed.     losartan (COZAAR) 100 MG tablet TAKE 1 TABLET BY MOUTH EVERY DAY FOR BLOOD PRESSURE 90 tablet 2   montelukast (SINGULAIR) 10 MG tablet TAKE 1 TABLET (10 MG TOTAL) BY MOUTH AT BEDTIME FOR ALLERGIES 90 tablet 0   Multiple Vitamin (MULTIVITAMIN) tablet Take 1 tablet by mouth daily.     pantoprazole (PROTONIX) 40 MG tablet TAKE 1 TABLET BY MOUTH DAILY FOR HEARTBURN. 90 tablet 2   sertraline (ZOLOFT) 100 MG tablet TAKE 1 TABLET (100 MG TOTAL) BY MOUTH DAILY. FOR ANXIETY AND DEPRESSION. 90 tablet 2   oxyCODONE (OXY IR/ROXICODONE) 5 MG immediate release tablet Take 1 tablet (5 mg total) by mouth every 6 (six) hours as needed for moderate pain. (Patient not taking: Reported on 07/05/2022) 30 tablet 0   No current facility-administered medications on file prior to visit.    BP 114/78   Pulse 64   Temp 98.8 F (37.1 C) (Temporal)   Ht 6' (1.829 m)   Wt 246 lb (111.6 kg)   SpO2 98%   BMI 33.36 kg/m  Objective:   Physical  Exam HENT:     Right Ear: There is no impacted cerumen. Tympanic membrane is bulging. Tympanic membrane is not injected or erythematous.     Left Ear: There is no impacted cerumen. Tympanic membrane is bulging. Tympanic membrane is not injected or erythematous.  Neck:   Musculoskeletal:     Cervical back: Muscular tenderness present. No pain with movement or spinous process tenderness. Normal range of motion.           Assessment & Plan:  Chronic neck pain Assessment & Plan: Secondary to osteoarthritis with what seems to be a muscle spasm.  Start cyclobenzaprine 5 mg TID PRN, drowsiness precautions provided.  Discussed use of Tylenol and heat, stretching.   Orders: -     Cyclobenzaprine HCl; Take 1 tablet (5 mg total) by mouth 3 (three) times daily as needed for muscle spasms.  Dispense: 15 tablet; Refill: 0  Otalgia of left ear Assessment & Plan: No infection or impaction.  Likely secondary to allergies. Continue Singulair 10 mg HS. Start Flonase BID. Rx sent to pharmacy.  Follow up PRN.  Orders: -     Fluticasone Propionate; Place 1 spray into both nostrils 2 (two) times daily.  Dispense: 16 g; Refill: 0        Pleas Koch, NP

## 2022-07-05 NOTE — Patient Instructions (Signed)
You may take the cyclobenzaprine muscle relaxer three times daily as needed for muscle pain/neck pain. This may cause drowsiness.   Try using Flonase (fluticasone) nasal spray for your ear pai. Instill 1 spray in each nostril twice daily.   It was a pleasure to see you today!

## 2022-07-17 IMAGING — CT CT HEAD W/O CM
4 series · 16 of 47 positions shown, 18 images · non-contrast
Comparison: 11/18/2020 and MRI brain from 11/11/2020

CLINICAL DATA: Diplopia starting at [DATE] today.  Recent headache.



[Series 3: head wo · axial · 0.42mm/px · z∈[+515,+635]mm · 7 of 32 slices shown, 9 images]
[im 4/32  brain]
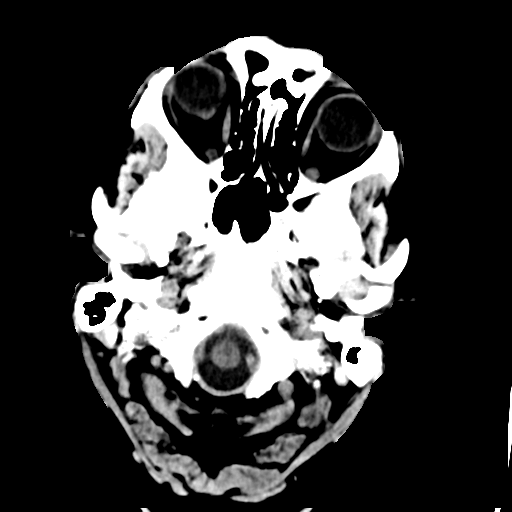
[im 4/32  bone]
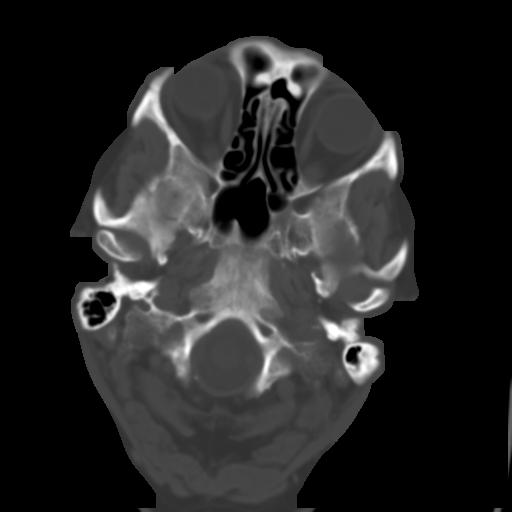
[im 8/32  brain]
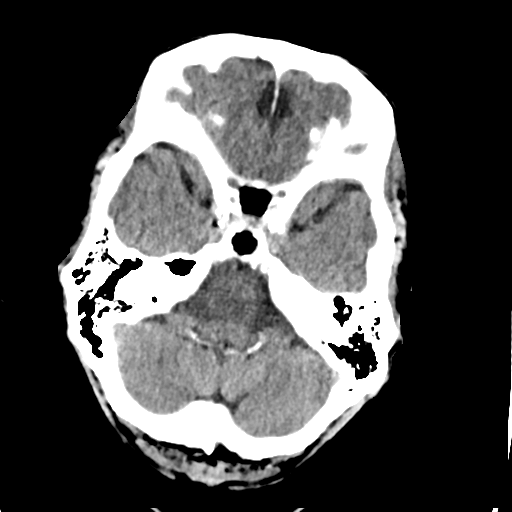
[im 12/32  brain]
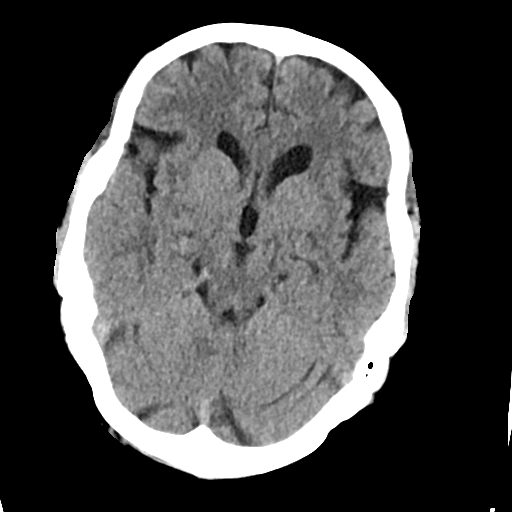
[im 16/32  brain]
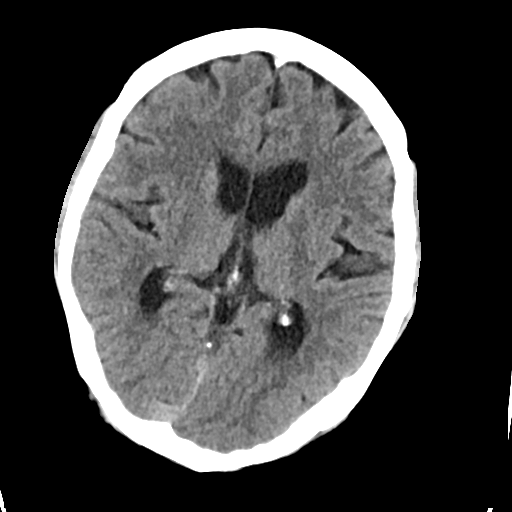
[im 20/32  brain]
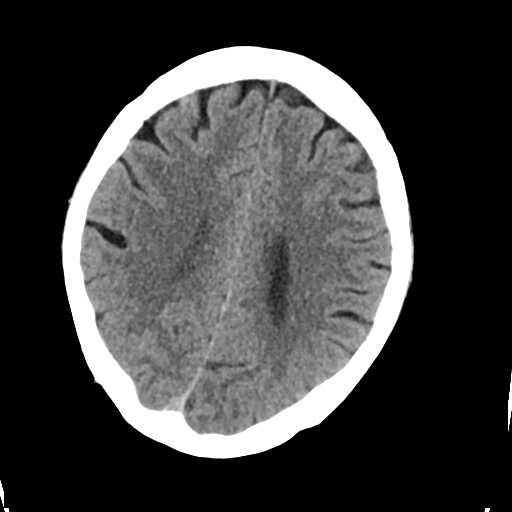
[im 20/32  bone]
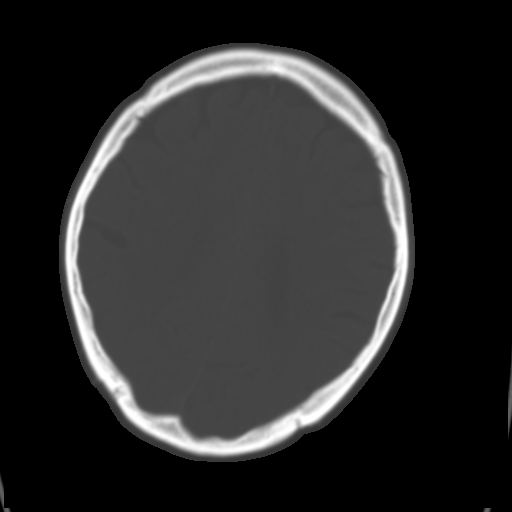
[im 24/32  brain]
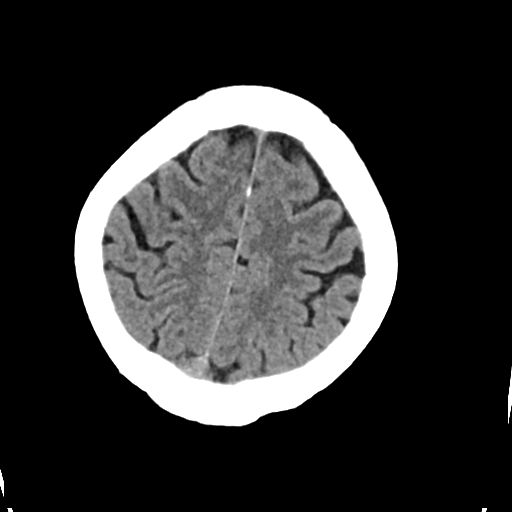
[im 28/32  brain]
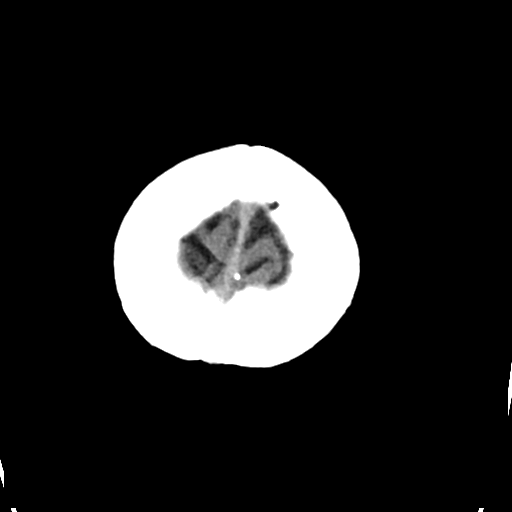

[Series 4: head bone · axial · 0.42mm/px · z∈[+514,+546]mm · 3 of 79 slices shown]
[im 8/79  bone]
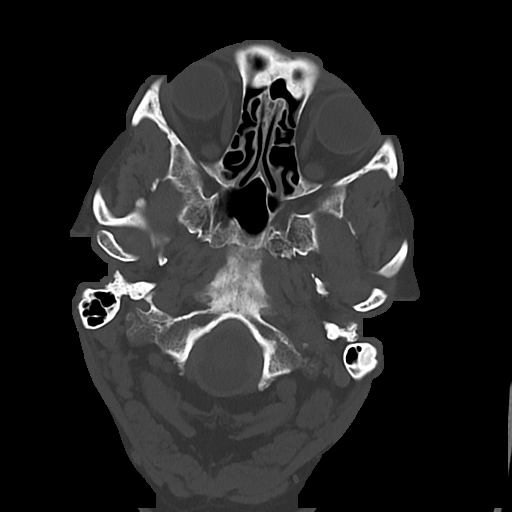
[im 16/79  bone]
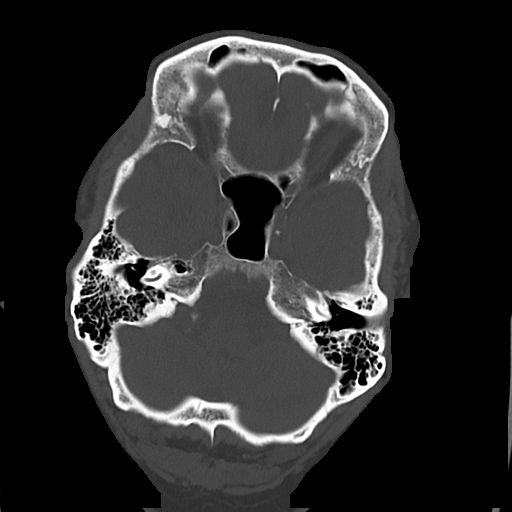
[im 24/79  bone]
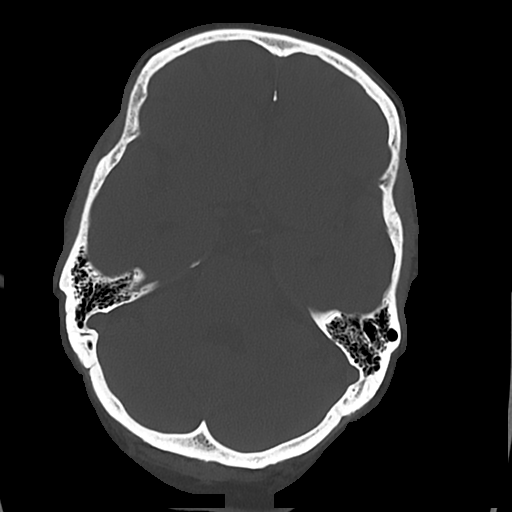

[Series 5: cor soft · coronal · 0.32mm/px · 3 of 70 slices shown]
[im 24/70  brain]
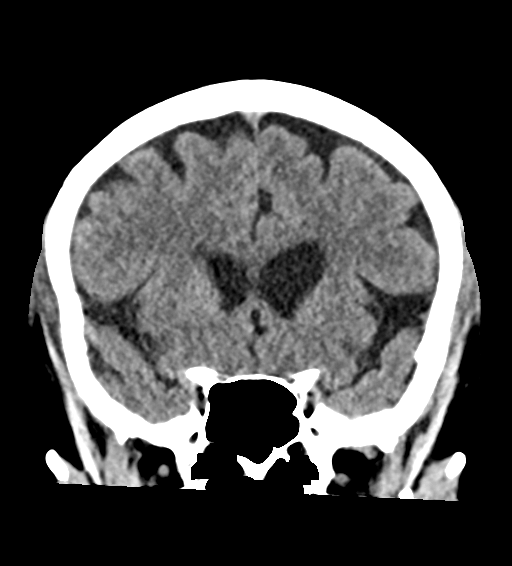
[im 31/70  brain]
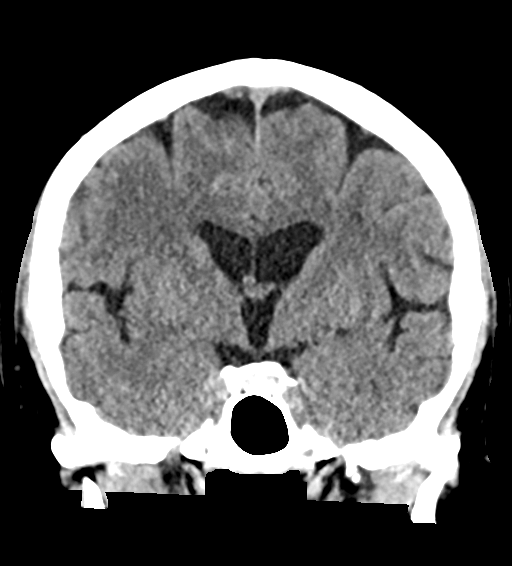
[im 39/70  brain]
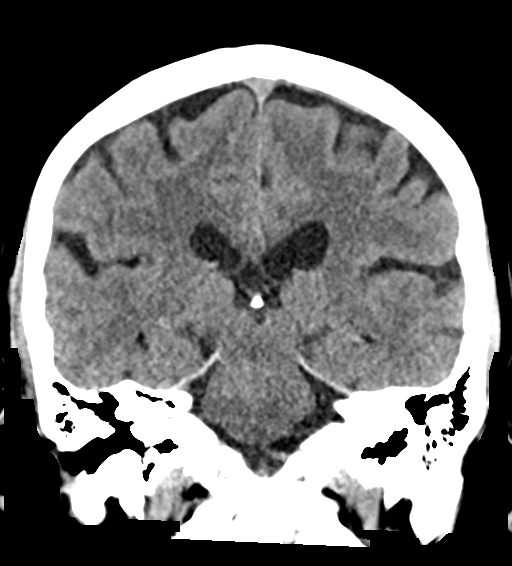

[Series 6: sag soft · sagittal · 0.37mm/px · 3 of 59 slices shown]
[im 20/59  brain]
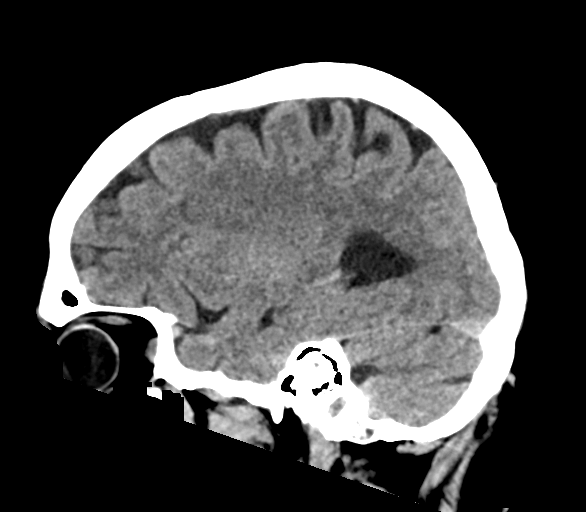
[im 30/59  brain]
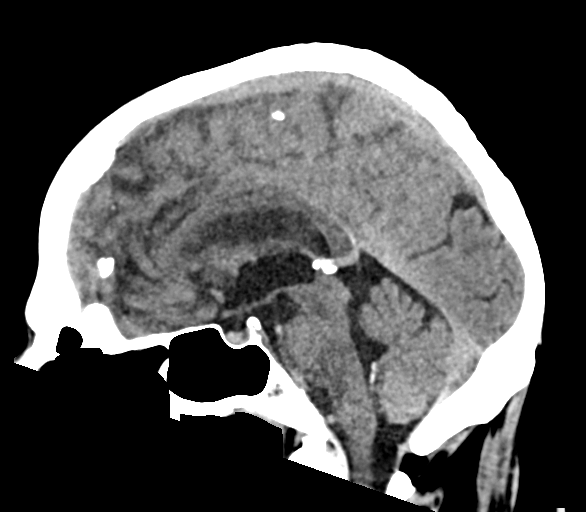
[im 39/59  brain]
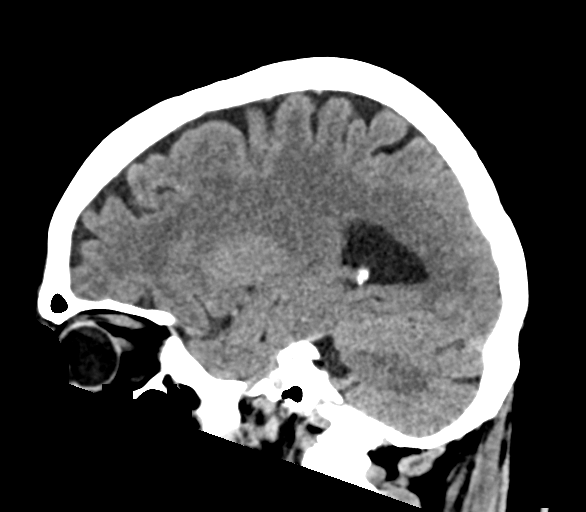

[16 of 47 positions shown; findings below may reference images not displayed]

FINDINGS: Brain: Chronic mild encephalomalacia along the medial anterior
frontal lobes bilaterally, unchanged.

Otherwise, the brainstem, cerebellum, cerebral peduncles, thalamus,
basal ganglia, basilar cisterns, and ventricular system appear
within normal limits. No intracranial hemorrhage, mass lesion, or
acute CVA.

Vascular: There is atherosclerotic calcification of the cavernous
carotid arteries bilaterally.

Skull: Unremarkable

Sinuses/Orbits: Unremarkable

Other: Unremarkable
IMPRESSION: 1. No acute intracranial findings.
2. Chronically stable medial inferior frontal lobe encephalomalacia
likely from remote injury.
3. Atherosclerosis.

## 2022-07-17 IMAGING — MR MR MRA NECK WO/W CM
7 of 9 series · 37 of 48 positions shown · IV contrast (Contrast agent)
Comparison: CT head 05/30/2021

CLINICAL DATA: Diplopia.  Rule out stroke

EXAM:
MRI HEAD WITHOUT AND WITH CONTRAST
MRA HEAD WITHOUT CONTRAST
MRA NECK WITHOUT AND WITH CONTRAST
TECHNIQUE: Multiplanar, multiecho pulse sequences of the brain and surrounding
structures were obtained without and with intravenous contrast.
Angiographic images of the Circle of Willis were obtained using MRA
technique without intravenous contrast. Angiographic images of the
neck were obtained using MRA technique without and with intravenous
contrast. Carotid stenosis measurements (when applicable) are
obtained utilizing NASCET criteria, using the distal internal
carotid diameter as the denominator.
CONTRAST:  10mL GADAVIST GADOBUTROL 1 MMOL/ML IV SOLN

[Series 24: tof_fl3d_tra_iso · axial · 0.6mm · 0.52mm/px · z∈[-180,-102]mm · 8 of 133 slices shown]
[im 1/133]
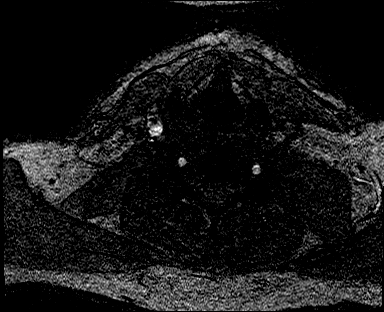
[im 19/133]
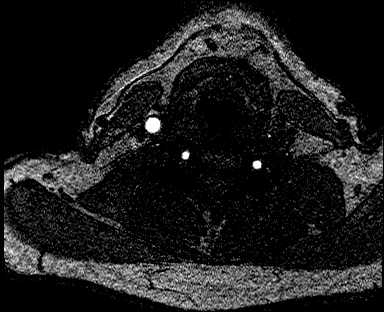
[im 38/133]
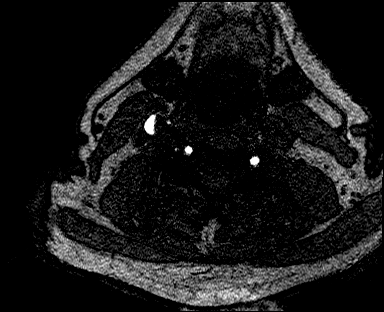
[im 57/133]
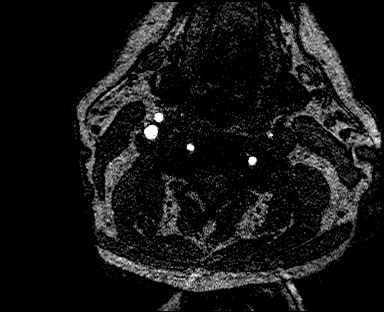
[im 76/133]
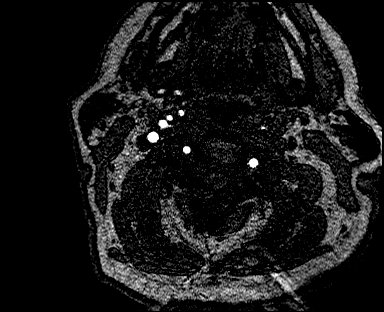
[im 95/133]
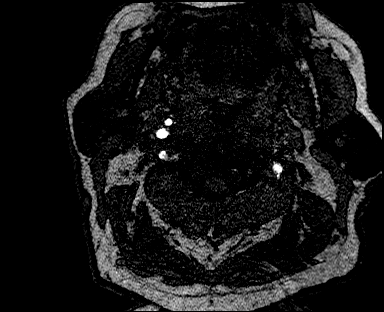
[im 114/133]
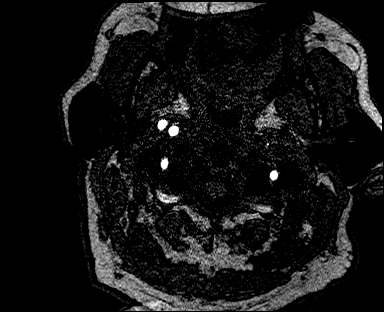
[im 133/133]
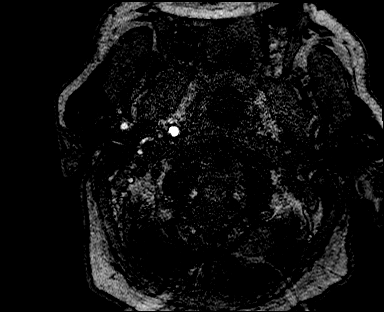

[Series 28: angio_fl3d_cor_post_ttc=3.0s · coronal · 0.9mm · 0.85mm/px · 5 of 88 slices shown (1 of 2)]
[im 1/88]
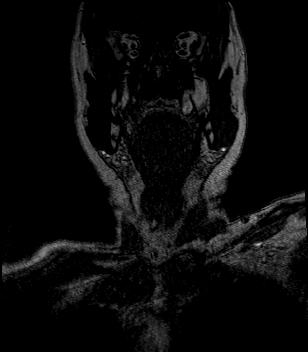
[im 22/88]
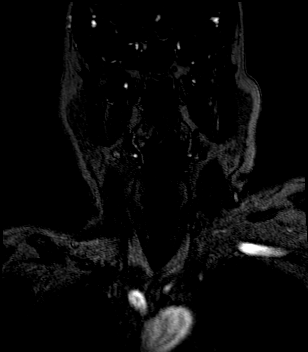
[im 44/88]
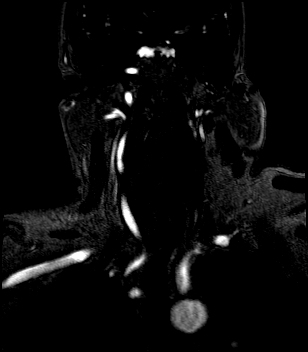
[im 66/88]
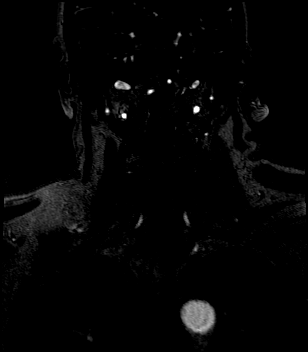
[im 88/88]
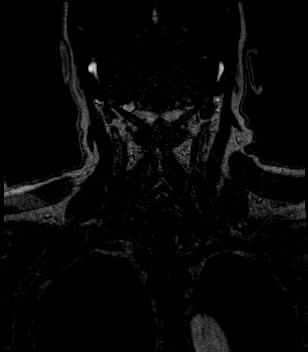

[Series 29: angio_fl3d_cor_post_ttc=3.0s_moco-adv · coronal · 0.9mm · 0.85mm/px · 6 of 88 slices shown (1 of 2)]
[im 1/88]
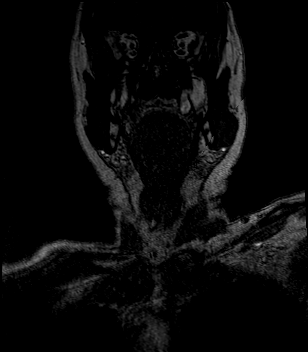
[im 18/88]
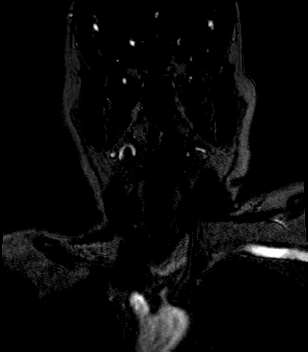
[im 35/88]
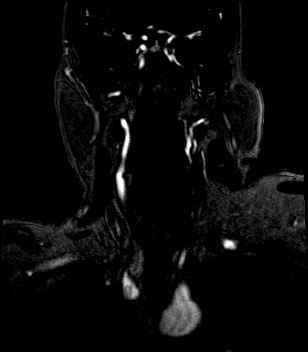
[im 53/88]
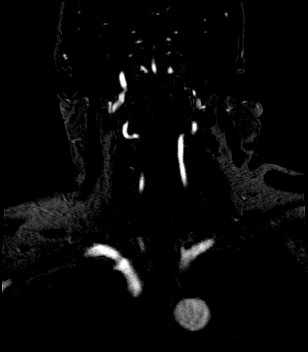
[im 70/88]
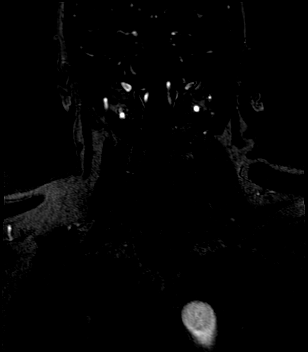
[im 88/88]
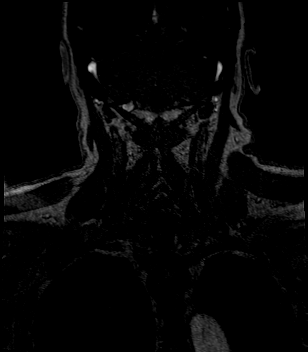

[Series 30: angio_fl3d_cor_post_ttc=3.0s_moco-adv_sub · coronal · 0.9mm · 0.85mm/px · 6 of 86 slices shown]
[im 1/86]
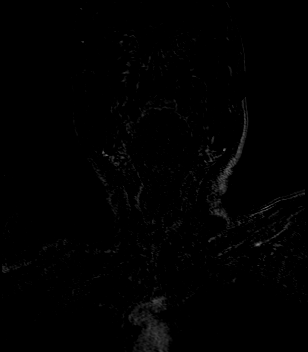
[im 18/86]
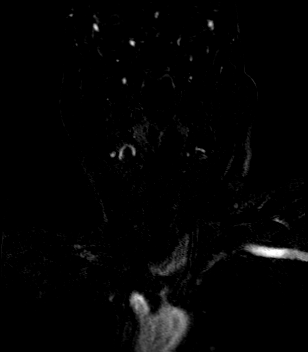
[im 35/86]
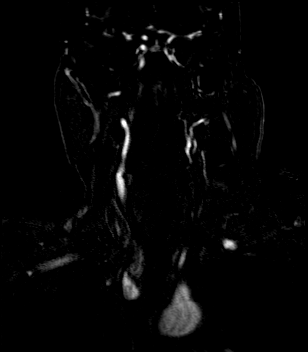
[im 52/86]
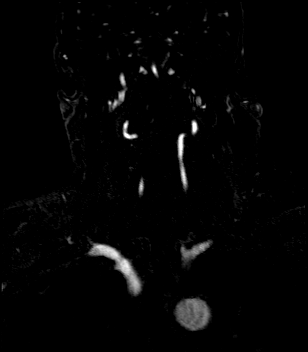
[im 69/86]
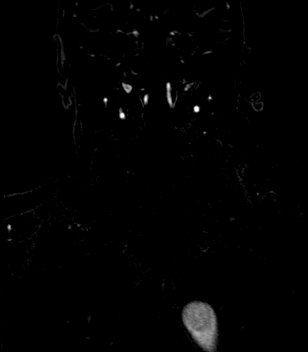
[im 86/86]
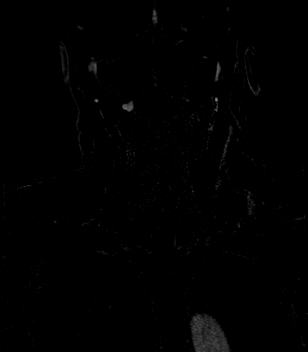

[Series 32: angio_fl3d_cor_post_ttc=3.0s · coronal · 0.9mm · 0.85mm/px · 6 of 88 slices shown (2 of 2)]
[im 1/88]
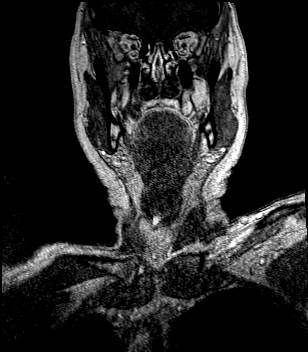
[im 18/88]
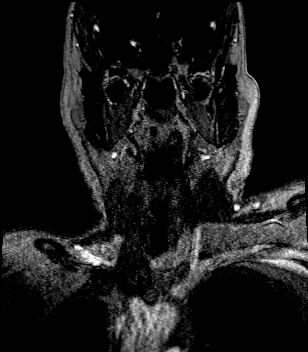
[im 35/88]
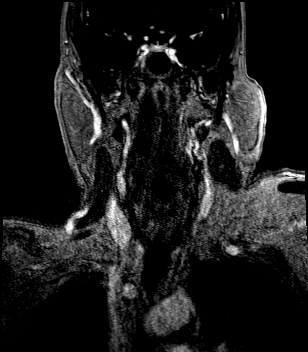
[im 53/88]
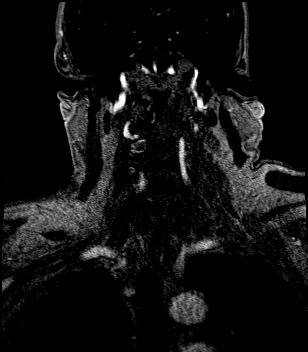
[im 70/88]
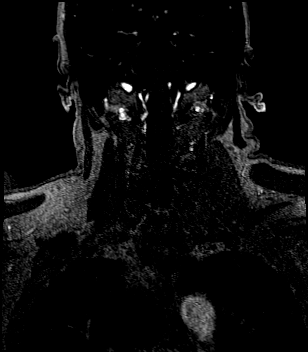
[im 88/88]
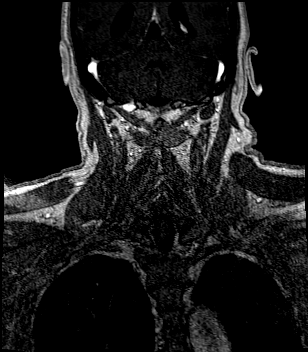

[Series 33: angio_fl3d_cor_post_ttc=3.0s_moco-adv · coronal · 0.9mm · 0.85mm/px · 4 of 88 slices shown (2 of 2)]
[im 1/88]
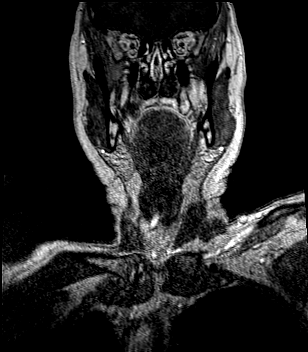
[im 18/88]
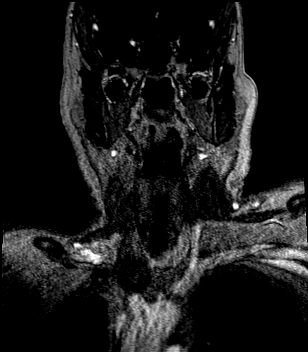
[im 35/88]
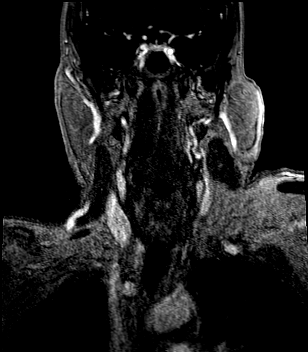
[im 53/88]
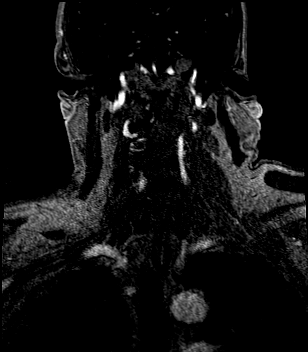

[Series 37: T1 post-contrast · coronal · 5.0mm · 0.34mm/px · 2 of 30 slices shown]
[im 1/30]
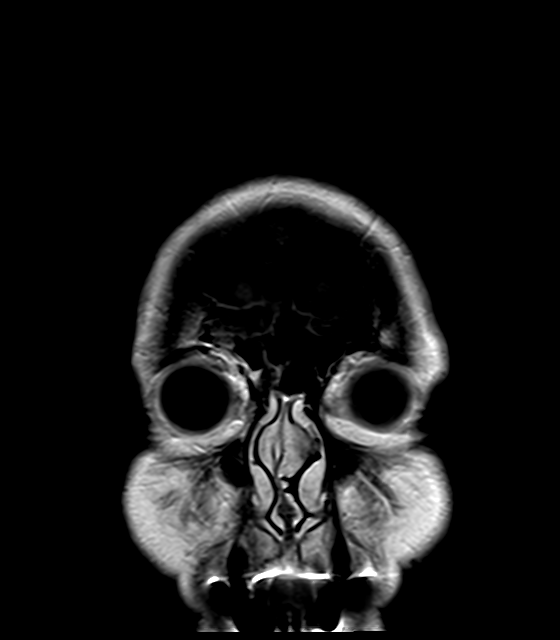
[im 30/30]
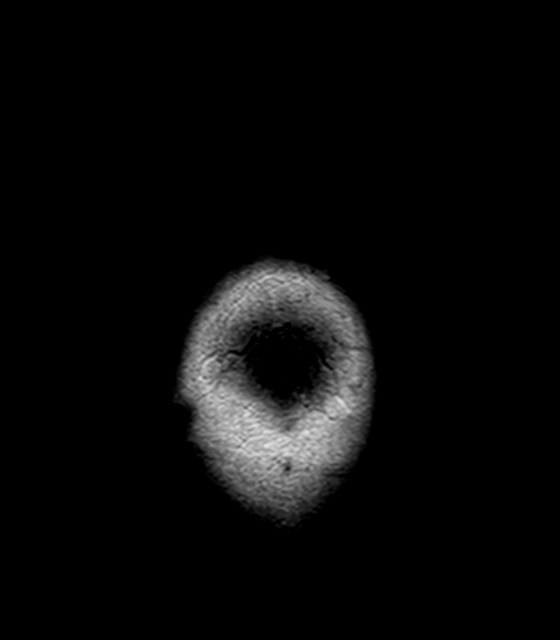

[37 of 48 positions shown; findings below may reference images not displayed]

FINDINGS: MRI HEAD FINDINGS

Brain: Negative for acute infarct. Mild white matter changes
consistent with chronic microvascular ischemia. Brainstem and
cerebellum intact. Negative for hemorrhage, mass, or fluid
collection. Ventricle size and cerebral volume normal for age.
Normal enhancement of the brain postcontrast administration.

Vascular: Loss of flow void left internal carotid artery through the
skull base and cavernous segment compatible with occlusion.
Otherwise normal arterial flow voids.

Skull and upper cervical spine: No focal skeletal lesion.

Sinuses/Orbits: Mild mucosal edema paranasal sinuses. Bilateral
cataract extraction

Other: None

MRA HEAD FINDINGS

Minimal flow left internal carotid artery. Small amount of flow
related signal in the left internal carotid artery in the neck and
cavernous carotid. This is similar to prior CTA of 11/18/2020. Right
internal carotid artery widely patent.

Anterior and middle cerebral arteries patent bilaterally without
stenosis or large vessel occlusion. Negative for aneurysm.

Internal carotid artery patent bilaterally. Right PICA patent. Left
PICA not visualized. Left AICA patent. Basilar widely patent.
Superior cerebellar and posterior cerebral arteries patent. Fetal
origin left posterior cerebral artery. Negative for aneurysm.

MRA NECK FINDINGS

Normal aortic arch. Innominate artery widely patent. Left subclavian
artery widely patent

Moderate stenosis at the origin of the left common carotid artery.
Left common carotid artery is a small vessel with slow flow. This
vessel appears to occlude near the carotid bifurcation however there
could be a small branch patent. Left internal carotid artery may be
occluded or have slow flow. This is difficult to determine due to
enhancing collateral vessels in the neck.

Both vertebral arteries are patent without stenosis in the neck.
IMPRESSION: 1. Negative for acute infarct. Mild chronic microvascular ischemic
change in the white matter.
2. Slow flow left common carotid artery with proximal stenosis.
Possible occlusion of the distal left common carotid artery.
Possible occlusion versus minimal flow left internal carotid artery.
The MRA head suggests there is a small amount of flow in the left
internal carotid artery through the skull base. This is similar to
the prior CT angiogram 11/18/2020.

## 2022-07-27 ENCOUNTER — Other Ambulatory Visit: Payer: Self-pay | Admitting: Primary Care

## 2022-07-27 DIAGNOSIS — H9202 Otalgia, left ear: Secondary | ICD-10-CM

## 2022-07-31 NOTE — Progress Notes (Unsigned)
    Sean Macwilliams T. Rance Smithson, MD, CAQ Sports Medicine [facility] [address] [place], [address]  Phone: [phone]  FAX: [phone]  GILFORD LARDIZABAL - 77 y.o. male  MRN [id number]  Date of Birth: May 11, 1945  Date: 08/01/2022  PCP: Doreene Nest, NP  Referral: Doreene Nest, NP  No chief complaint on file.  Subjective:   ZACARY BAUER is a 77 y.o. very pleasant male patient with There is no height or weight on file to calculate BMI. who presents with the following:  Patient presents with some ongoing finger pain.  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  At that point I did do an MCP joint injection.    Review of Systems is noted in the HPI, as appropriate  Objective:   There were no vitals taken for this visit.  GEN: No acute distress; alert,appropriate. PULM: Breathing comfortably in no respiratory distress PSYCH: Normally interactive.   Laboratory and Imaging Data:  Assessment and Plan:   ***

## 2022-08-01 ENCOUNTER — Ambulatory Visit (INDEPENDENT_AMBULATORY_CARE_PROVIDER_SITE_OTHER): Payer: Medicare HMO | Admitting: Family Medicine

## 2022-08-01 ENCOUNTER — Encounter: Payer: Self-pay | Admitting: Family Medicine

## 2022-08-01 VITALS — BP 120/60 | HR 71 | Temp 98.0°F | Ht 72.0 in | Wt 248.5 lb

## 2022-08-01 DIAGNOSIS — M19042 Primary osteoarthritis, left hand: Secondary | ICD-10-CM

## 2022-08-01 MED ORDER — TRIAMCINOLONE ACETONIDE 40 MG/ML IJ SUSP
20.0000 mg | Freq: Once | INTRAMUSCULAR | Status: AC
  Administered 2022-08-01: 20 mg via INTRA_ARTICULAR

## 2022-08-13 ENCOUNTER — Ambulatory Visit (INDEPENDENT_AMBULATORY_CARE_PROVIDER_SITE_OTHER): Payer: Medicare HMO | Admitting: Vascular Surgery

## 2022-08-13 ENCOUNTER — Encounter (INDEPENDENT_AMBULATORY_CARE_PROVIDER_SITE_OTHER): Payer: Medicare HMO

## 2022-08-15 ENCOUNTER — Other Ambulatory Visit (INDEPENDENT_AMBULATORY_CARE_PROVIDER_SITE_OTHER): Payer: Self-pay | Admitting: Vascular Surgery

## 2022-08-15 DIAGNOSIS — I6523 Occlusion and stenosis of bilateral carotid arteries: Secondary | ICD-10-CM

## 2022-08-20 ENCOUNTER — Ambulatory Visit (INDEPENDENT_AMBULATORY_CARE_PROVIDER_SITE_OTHER): Payer: Medicare HMO | Admitting: Vascular Surgery

## 2022-08-20 ENCOUNTER — Encounter (INDEPENDENT_AMBULATORY_CARE_PROVIDER_SITE_OTHER): Payer: Self-pay | Admitting: Vascular Surgery

## 2022-08-20 ENCOUNTER — Ambulatory Visit (INDEPENDENT_AMBULATORY_CARE_PROVIDER_SITE_OTHER): Payer: Medicare HMO

## 2022-08-20 VITALS — BP 126/76 | HR 64 | Resp 18 | Ht 72.0 in | Wt 243.0 lb

## 2022-08-20 DIAGNOSIS — E785 Hyperlipidemia, unspecified: Secondary | ICD-10-CM | POA: Diagnosis not present

## 2022-08-20 DIAGNOSIS — I10 Essential (primary) hypertension: Secondary | ICD-10-CM | POA: Diagnosis not present

## 2022-08-20 DIAGNOSIS — I6522 Occlusion and stenosis of left carotid artery: Secondary | ICD-10-CM | POA: Diagnosis not present

## 2022-08-20 DIAGNOSIS — I739 Peripheral vascular disease, unspecified: Secondary | ICD-10-CM | POA: Diagnosis not present

## 2022-08-20 DIAGNOSIS — K219 Gastro-esophageal reflux disease without esophagitis: Secondary | ICD-10-CM

## 2022-08-20 DIAGNOSIS — I6523 Occlusion and stenosis of bilateral carotid arteries: Secondary | ICD-10-CM

## 2022-08-20 NOTE — Progress Notes (Signed)
MRN : [id number]  Alan [name] is a 77 y.o. ([birth year]) male who presents with chief complaint of check carotid arteries.  History of Present Illness:   The patient is seen for follow up evaluation of carotid stenosis status post left carotid endarterectomy on April 11, 2022.  Repair was using a bovine pericardial patch.  There were no post operative problems or complications related to the surgery.  The patient denies neck or incisional pain although he still has numbness below the jaw.  He does state that the numbness of his ear has improved significantly.  The patient denies interval amaurosis fugax. There is no recent history of TIA symptoms or focal motor deficits. There is no prior documented CVA.  The patient denies headache.  The patient is taking enteric-coated aspirin 81 mg daily.  No recent shortening of the patient's walking distance or new symptoms consistent with claudication.  No history of rest pain symptoms. No new ulcers or wounds of the lower extremities have occurred.  There is no history of DVT, PE or superficial thrombophlebitis. No recent episodes of angina or shortness of breath documented.  Duplex ultrasound of the carotid arteries obtained today demonstrates 1 to 39% stenosis bilaterally.  The left endarterectomy site is widely patent.  Current Meds  Medication Sig   acetaminophen (TYLENOL) 500 MG tablet Take 1,000 mg by mouth every 6 (six) hours as needed.   amLODipine (NORVASC) 10 MG tablet TAKE 1 TABLET BY MOUTH EVERY DAY FOR BLOOD PRESSURE   aspirin EC 81 MG tablet Take 1 tablet (81 mg total) by mouth daily at 6 (six) AM. Swallow whole.   atorvastatin (LIPITOR) 80 MG tablet TAKE 1 TABLET BY MOUTH EVERY DAY for cholesterol.   clopidogrel (PLAVIX) 75 MG tablet TAKE 1 TABLET BY MOUTH EVERY DAY   cyclobenzaprine (FLEXERIL) 5 MG tablet Take 1 tablet (5 mg total) by mouth 3 (three) times daily as needed for muscle spasms.   fish  oil-omega-3 fatty acids 1000 MG capsule Take 2 g by mouth daily.   fluticasone (FLONASE) 50 MCG/ACT nasal spray PLACE 1 SPRAY INTO BOTH NOSTRILS 2 (TWO) TIMES DAILY   ketoconazole (NIZORAL) 2 % shampoo as needed.   losartan (COZAAR) 100 MG tablet TAKE 1 TABLET BY MOUTH EVERY DAY FOR BLOOD PRESSURE   montelukast (SINGULAIR) 10 MG tablet TAKE 1 TABLET (10 MG TOTAL) BY MOUTH AT BEDTIME FOR ALLERGIES   Multiple Vitamin (MULTIVITAMIN) tablet Take 1 tablet by mouth daily.   oxyCODONE (OXY IR/ROXICODONE) 5 MG immediate release tablet Take 1 tablet (5 mg total) by mouth every 6 (six) hours as needed for moderate pain.   pantoprazole (PROTONIX) 40 MG tablet TAKE 1 TABLET BY MOUTH DAILY FOR HEARTBURN.   sertraline (ZOLOFT) 100 MG tablet TAKE 1 TABLET (100 MG TOTAL) BY MOUTH DAILY. FOR ANXIETY AND DEPRESSION.    Past Medical History:  Diagnosis Date   Allergic rhinitis, cause unspecified    Allergy    Anxiety    Cancer (HCC)    Basal cell skin cancer,left ear   Carotid artery stenosis    left   Carpal tunnel syndrome    Chronic airway obstruction, not elsewhere classified    COPD with acute exacerbation (HCC) 10/02/2007   Qualifier: Diagnosis of  By: Maple Hudson MD, Clinton D    Coronary artery disease    Depression  Dyspnea    Esophageal reflux    Glaucoma (increased eye pressure)    History of kidney stones    Lipoma of unspecified site    Osteoarthrosis, unspecified whether generalized or localized, lower leg    Other and unspecified hyperlipidemia    Peripheral vascular disease (HCC)    Personal history of colonic polyps    Personal history of other malignant neoplasm of skin    PONV (postoperative nausea and vomiting)    Stroke [facility])    " mini stroke"   Unspecified essential hypertension    Viral gastroenteritis 09/20/2021    Past Surgical History:  Procedure Laterality Date   BACK SURGERY     lumbar disc surgery   bone spur  06/2003   right thumb   CAROTID ANGIOGRAPHY Left  01/23/2022   Procedure: CAROTID ANGIOGRAPHY;  Surgeon: Renford Dills, MD;  Location: [facility] INVASIVE CV LAB;  Service: Cardiovascular;  Laterality: Left;   CATARACT EXTRACTION     COLONOSCOPY W/ POLYPECTOMY     COLONOSCOPY WITH ESOPHAGOGASTRODUODENOSCOPY (EGD)     ENDARTERECTOMY Left 04/11/2022   Procedure: ENDARTERECTOMY CAROTID;  Surgeon: Renford Dills, MD;  Location: [facility] ORS;  Service: Vascular;  Laterality: Left;   KNEE ARTHROSCOPY  09/2007   partial medial mesicecotmy, patellar chonfroplasty   SHOULDER SURGERY  03/24/2007   removal bone spur   SHOULDER SURGERY Left 07/2016   [facility], Dr. Olegario Shearer   TOTAL KNEE ARTHROPLASTY     03-2009 [facility]   TOTAL KNEE ARTHROPLASTY Left 12/2012   [facility]    Social History Social History   Tobacco Use   Smoking status: Former    Packs/day: 3.00    Years: 25.00    Additional pack years: 0.00    Total pack years: 75.00    Types: Cigarettes    Quit date: 11/23/1996    Years since quitting: 25.7   Smokeless tobacco: Never  Vaping Use   Vaping Use: Never used  Substance Use Topics   Alcohol use: Not Currently    Alcohol/week: 0.0 standard drinks of alcohol   Drug use: No    Family History Family History  Problem Relation Age of Onset   Diabetes Mother    Hyperlipidemia Mother    Hypertension Mother    Heart attack Mother    Obesity Mother    Hyperlipidemia Father    Hypertension Father    Lung cancer Father    Colon polyps Father    Hypertension Sister    Hyperlipidemia Sister    Lung cancer Sister    Hyperlipidemia Sister    Heart attack Brother    Diabetes Brother    Hyperlipidemia Brother    Hypertension Brother    Hyperlipidemia Brother    Hypertension Brother    Drug abuse Daughter    Colon cancer Neg Hx    Esophageal cancer Neg Hx    Pancreatic cancer Neg Hx    Rectal cancer Neg Hx     No Known Allergies   REVIEW OF SYSTEMS (Negative unless checked)  Constitutional: [] Weight  loss  [] Fever  [] Chills Cardiac: [] Chest pain   [] Chest pressure   [] Palpitations   [] Shortness of breath when laying flat   [] Shortness of breath with exertion. Vascular:  [x] Pain in legs with walking   [] Pain in legs at rest  [] History of DVT   [] Phlebitis   [] Swelling in legs   [] Varicose veins   [] Non-healing ulcers Pulmonary:   [] Uses home oxygen   []   Productive cough   [] Hemoptysis   [] Wheeze  [] COPD   [] Asthma Neurologic:  [] Dizziness   [] Seizures   [] History of stroke   [] History of TIA  [] Aphasia   [] Vissual changes   [] Weakness or numbness in arm   [] Weakness or numbness in leg Musculoskeletal:   [] Joint swelling   [] Joint pain   [] Low back pain Hematologic:  [] Easy bruising  [] Easy bleeding   [] Hypercoagulable state   [] Anemic Gastrointestinal:  [] Diarrhea   [] Vomiting  [x] Gastroesophageal reflux/heartburn   [] Difficulty swallowing. Genitourinary:  [] Chronic kidney disease   [] Difficult urination  [] Frequent urination   [] Blood in urine Skin:  [] Rashes   [] Ulcers  Psychological:  [] History of anxiety   []  History of major depression.  Physical Examination  Vitals:   08/20/22 0836  BP: 126/76  Pulse: 64  Resp: 18  Weight: 243 lb (110.2 kg)  Height: 6' (1.829 m)   Body mass index is 32.96 kg/m. Gen: WD/WN, NAD Head: [place]/AT, No temporalis wasting.  Ear/Nose/Throat: Hearing grossly intact, nares w/o erythema or drainage Eyes: PER, EOMI, sclera nonicteric.  Neck: Supple, no masses.  No bruit or JVD.  Pulmonary:  Good air movement, no audible wheezing, no use of accessory muscles.  Cardiac: RRR, normal S1, S2, no Murmurs. Vascular: No carotid bruit noted; incisional scar is well-healed Vessel Right Left  Radial Palpable Palpable  Carotid  Palpable  Palpable  Gastrointestinal: soft, non-distended. No guarding/no peritoneal signs.  Musculoskeletal: M/S 5/5 throughout.  No visible deformity.  Neurologic: CN 2-12 intact. Pain and light touch intact in extremities.  Symmetrical.   Speech is fluent. Motor exam as listed above. Psychiatric: Judgment intact, Mood & affect appropriate for pt's clinical situation. Dermatologic: No rashes or ulcers noted.  No changes consistent with cellulitis.   CBC Lab Results  Component Value Date   WBC 6.5 04/16/2022   HGB 12.6 (L) 04/16/2022   HCT 37.8 (L) 04/16/2022   MCV 88.8 04/16/2022   PLT 145.0 (L) 04/16/2022    BMET    Component Value Date/Time   NA 139 04/12/2022 0513   NA 136 01/16/2013 0616   K 4.3 04/12/2022 0513   K 3.5 01/16/2013 0616   CL 107 04/12/2022 0513   CL 105 01/16/2013 0616   CO2 25 04/12/2022 0513   CO2 26 01/16/2013 0616   GLUCOSE 134 (H) 04/12/2022 0513   GLUCOSE 126 (H) 01/16/2013 0616   BUN 22 04/12/2022 0513   BUN 10 01/16/2013 0616   CREATININE 0.88 04/12/2022 0513   CREATININE 0.69 01/16/2013 0616   CALCIUM 8.1 (L) 04/12/2022 0513   CALCIUM 9.0 01/16/2013 0616   GFRNONAA >60 04/12/2022 0513   GFRNONAA >60 01/16/2013 0616   GFRAA >60 01/16/2013 0616   CrCl cannot be calculated (Patient's most recent lab result is older than the maximum 21 days allowed.).  COAG Lab Results  Component Value Date   INR 1.0 05/30/2021   INR 1.0 11/07/2020   INR 1.0 12/24/2012    Radiology No results found.   Assessment/Plan 1. Stenosis of left carotid artery Recommend:  The patient is s/p successful left CEA  Duplex ultrasound  shows 1-39%  stenosis bilaterally.  Continue antiplatelet therapy as prescribed Continue management of CAD, HTN and Hyperlipidemia Healthy heart diet,  encouraged exercise at least 4 times per week  The patient's NIHSS score is as follows: 0 Mild: 1 - 5 Mild to Moderately Severe: 5 - 14 Severe: 15 - 24 Very Severe: >25  Follow up in  12 months with duplex ultrasound and physical exam based on the patient's carotid surgery and <50% stenosis of the right carotid artery  - VAS US CAROTID; Future  2. PAD (peripheral artery disease) (HCC) Recommend:  I do not  find evidence of life style limiting vascular disease. The patient specifically denies life style limitation.  Previous noninvasive studies including ABI's of the legs do not identify critical vascular problems.  The patient should continue walking and begin a more formal exercise program. The patient should continue his antiplatelet therapy and aggressive treatment of the lipid abnormalities.  The patient is instructed to call the office if there is a significant change in the lower extremity symptoms, particularly if a wound develops or there is an abrupt increase in leg pain.  3. Essential hypertension Continue antihypertensive medications as already ordered, these medications have been reviewed and there are no changes at this time.  4. Hyperlipidemia LDL goal <70 Continue statin as ordered and reviewed, no changes at this time  5. Gastroesophageal reflux disease, unspecified whether esophagitis present Continue PPI as already ordered, this medication has been reviewed and there are no changes at this time.  Avoidence of caffeine and alcohol  Moderate elevation of the head of the bed     Levora Dredge, MD  08/20/2022 8:43 AM

## 2022-09-14 DIAGNOSIS — H34212 Partial retinal artery occlusion, left eye: Secondary | ICD-10-CM | POA: Diagnosis not present

## 2022-09-14 DIAGNOSIS — Z961 Presence of intraocular lens: Secondary | ICD-10-CM | POA: Diagnosis not present

## 2022-09-14 DIAGNOSIS — H40013 Open angle with borderline findings, low risk, bilateral: Secondary | ICD-10-CM | POA: Diagnosis not present

## 2022-09-14 DIAGNOSIS — G453 Amaurosis fugax: Secondary | ICD-10-CM | POA: Diagnosis not present

## 2022-09-14 DIAGNOSIS — H43813 Vitreous degeneration, bilateral: Secondary | ICD-10-CM | POA: Diagnosis not present

## 2022-09-14 DIAGNOSIS — H4311 Vitreous hemorrhage, right eye: Secondary | ICD-10-CM | POA: Diagnosis not present

## 2022-09-17 ENCOUNTER — Other Ambulatory Visit: Payer: Self-pay | Admitting: Primary Care

## 2022-09-17 DIAGNOSIS — K219 Gastro-esophageal reflux disease without esophagitis: Secondary | ICD-10-CM

## 2022-09-17 DIAGNOSIS — F3342 Major depressive disorder, recurrent, in full remission: Secondary | ICD-10-CM

## 2022-09-17 DIAGNOSIS — F411 Generalized anxiety disorder: Secondary | ICD-10-CM

## 2022-09-17 NOTE — Telephone Encounter (Signed)
LVM for patient to call back and schedule

## 2022-09-17 NOTE — Telephone Encounter (Signed)
Patient is due for CPE/follow up in mid August, this will be required prior to any further refills.  Please schedule, thank you!   

## 2022-09-20 DIAGNOSIS — G453 Amaurosis fugax: Secondary | ICD-10-CM | POA: Diagnosis not present

## 2022-09-20 DIAGNOSIS — H43813 Vitreous degeneration, bilateral: Secondary | ICD-10-CM | POA: Diagnosis not present

## 2022-09-20 DIAGNOSIS — H34212 Partial retinal artery occlusion, left eye: Secondary | ICD-10-CM | POA: Diagnosis not present

## 2022-09-20 DIAGNOSIS — H4311 Vitreous hemorrhage, right eye: Secondary | ICD-10-CM | POA: Diagnosis not present

## 2022-09-20 DIAGNOSIS — H40013 Open angle with borderline findings, low risk, bilateral: Secondary | ICD-10-CM | POA: Diagnosis not present

## 2022-10-02 ENCOUNTER — Ambulatory Visit (INDEPENDENT_AMBULATORY_CARE_PROVIDER_SITE_OTHER): Payer: Medicare HMO

## 2022-10-02 VITALS — Ht 72.0 in | Wt 243.0 lb

## 2022-10-02 DIAGNOSIS — Z0000 Encounter for general adult medical examination without abnormal findings: Secondary | ICD-10-CM | POA: Diagnosis not present

## 2022-10-02 NOTE — Patient Instructions (Addendum)
Mr. Alan [name] , Thank you for taking time to come for your Medicare Wellness Visit. I appreciate your ongoing commitment to your health goals. Please review the following plan we discussed and let me know if I can assist you in the future.   These are the goals we discussed:  Goals       DIET - EAT MORE FRUITS AND VEGETABLES      Increase physical activity (pt-stated)      Starting 10/12/2016, I will resume my previous exercise regimen of going to the gym 3 days weekly.      Patient Stated      Starting 10/18/2017, I will continue to take medications as prescribed.       Patient Stated      07/10/2019, I will continue to go to the gym 3 days a week for 45 minutes.       Stay Healthy (pt-stated)        This is a list of the screening recommended for you and due dates:  Health Maintenance  Topic Date Due   Flu Shot  11/01/2022   Medicare Annual Wellness Visit  10/02/2023   DTaP/Tdap/Td vaccine (3 - Td or Tdap) 10/16/2026   Pneumonia Vaccine  Completed   COVID-19 Vaccine  Completed   Hepatitis C Screening  Completed   Zoster (Shingles) Vaccine  Completed   HPV Vaccine  Aged Out   Colon Cancer Screening  Discontinued   Opioid Pain Medicine Management Opioids are powerful medicines that are used to treat moderate to severe pain. When used for short periods of time, they can help you to: Sleep better. Do better in physical or occupational therapy. Feel better in the first few days after an injury. Recover from surgery. Opioids should be taken with the supervision of a trained health care provider. They should be taken for the shortest period of time possible. This is because opioids can be addictive, and the longer you take opioids, the greater your risk of addiction. This addiction can also be called opioid use disorder. What are the risks? Using opioid pain medicines for longer than 3 days increases your risk of side effects. Side effects include: Constipation. Nausea and  vomiting. Breathing difficulties (respiratory depression). Drowsiness. Confusion. Opioid use disorder. Itching. Taking opioid pain medicine for a long period of time can affect your ability to do daily tasks. It also puts you at risk for: Motor vehicle crashes. Depression. Suicide. Heart attack. Overdose, which can be life-threatening. What is a pain treatment plan? A pain treatment plan is an agreement between you and your health care provider. Pain is unique to each person, and treatments vary depending on your condition. To manage your pain, you and your health care provider need to work together. To help you do this: Discuss the goals of your treatment, including how much pain you might expect to have and how you will manage the pain. Review the risks and benefits of taking opioid medicines. Remember that a good treatment plan uses more than one approach and minimizes the chance of side effects. Be honest about the amount of medicines you take and about any drug or alcohol use. Get pain medicine prescriptions from only one health care provider. Pain can be managed with many types of alternative treatments. Ask your health care provider to refer you to one or more specialists who can help you manage pain through: Physical or occupational therapy. Counseling (cognitive behavioral therapy). Good nutrition. Biofeedback. Massage. Meditation. Non-opioid medicine. Following  a gentle exercise program. How to use opioid pain medicine Taking medicine Take your pain medicine exactly as told by your health care provider. Take it only when you need it. If your pain gets less severe, you may take less than your prescribed dose if your health care provider approves. If you are not having pain, do nottake pain medicine unless your health care provider tells you to take it. If your pain is severe, do nottry to treat it yourself by taking more pills than instructed on your prescription. Contact  your health care provider for help. Write down the times when you take your pain medicine. It is easy to become confused while on pain medicine. Writing the time can help you avoid overdose. Take other over-the-counter or prescription medicines only as told by your health care provider. Keeping yourself and others safe  While you are taking opioid pain medicine: Do not drive, use machinery, or power tools. Do not sign legal documents. Do not drink alcohol. Do not take sleeping pills. Do not supervise children by yourself. Do not do activities that require climbing or being in high places. Do not go to a lake, river, ocean, spa, or swimming pool. Do not share your pain medicine with anyone. Keep pain medicine in a locked cabinet or in a secure area where pets and children cannot reach it. Stopping your use of opioids If you have been taking opioid medicine for more than a few weeks, you may need to slowly decrease (taper) how much you take until you stop completely. Tapering your use of opioids can decrease your risk of symptoms of withdrawal, such as: Pain and cramping in the abdomen. Nausea. Sweating. Sleepiness. Restlessness. Uncontrollable shaking (tremors). Cravings for the medicine. Do not attempt to taper your use of opioids on your own. Talk with your health care provider about how to do this. Your health care provider may prescribe a step-down schedule based on how much medicine you are taking and how long you have been taking it. Getting rid of leftover pills Do not save any leftover pills. Get rid of leftover pills safely by: Taking the medicine to a prescription take-back program. This is usually offered by the county or law enforcement. Bringing them to a pharmacy that has a drug disposal container. Flushing them down the toilet. Check the label or package insert of your medicine to see whether this is safe to do. Throwing them out in the trash. Check the label or package  insert of your medicine to see whether this is safe to do. If it is safe to throw it out, remove the medicine from the original container, put it into a sealable bag or container, and mix it with used coffee grounds, food scraps, dirt, or cat litter before putting it in the trash. Follow these instructions at home: Activity Do exercises as told by your health care provider. Avoid activities that make your pain worse. Return to your normal activities as told by your health care provider. Ask your health care provider what activities are safe for you. General instructions You may need to take these actions to prevent or treat constipation: Drink enough fluid to keep your urine pale yellow. Take over-the-counter or prescription medicines. Eat foods that are high in fiber, such as beans, whole grains, and fresh fruits and vegetables. Limit foods that are high in fat and processed sugars, such as fried or sweet foods. Keep all follow-up visits. This is important. Where to find support If you  have been taking opioids for a long time, you may benefit from receiving support for quitting from a local support group or counselor. Ask your health care provider for a referral to these resources in your area. Where to find more information Centers for Disease Control and Prevention (CDC): FootballExhibition.com.br [place] Food and Drug Administration (FDA): PumpkinSearch.com.ee Get help right away if: You may have taken too much of an opioid (overdosed). Common symptoms of an overdose: Your breathing is slower or more shallow than normal. You have a very slow heartbeat (pulse). You have slurred speech. You have nausea and vomiting. Your pupils become very small. You have other potential symptoms: You are very confused. You faint or feel like you will faint. You have cold, clammy skin. You have blue lips or fingernails. You have thoughts of harming yourself or harming others. These symptoms may represent a serious problem  that is an emergency. Do not wait to see if the symptoms will go away. Get medical help right away. Call your local emergency services (911 in the [place] Do not drive yourself to the hospital.  If you ever feel like you may hurt yourself or others, or have thoughts about taking your own life, get help right away. Go to your nearest emergency department or: Call your local emergency services (911 in the [place] Call the [facility] (680-683-7490 in the [place] Call a suicide crisis helpline, such as the National Suicide Prevention Lifeline at 331-130-7404 or 988 in the [place] This is open 24 hours a day in the [place] Text the Crisis Text Line at (239)324-6144 (in the [place] Summary Opioid medicines can help you manage moderate to severe pain for a short period of time. A pain treatment plan is an agreement between you and your health care provider. Discuss the goals of your treatment, including how much pain you might expect to have and how you will manage the pain. If you think that you or someone else may have taken too much of an opioid, get medical help right away. This information is not intended to replace advice given to you by your health care provider. Make sure you discuss any questions you have with your health care provider. Document Revised: 10/12/2020 Document Reviewed: 06/29/2020 Elsevier Patient Education  2024 [employer]  Advanced directives: Advance directive discussed with you today. Even though you declined this today, please call our office should you change your mind, and we can give you the proper paperwork for you to fill out.   Conditions/risks identified: None  Next appointment: Follow up in one year for your annual wellness visit.   Preventive Care 72 Years and Older, Male  Preventive care refers to lifestyle choices and visits with your health care provider that can promote health and wellness. What does preventive care include? A yearly physical exam.  This is also called an annual well check. Dental exams once or twice a year. Routine eye exams. Ask your health care provider how often you should have your eyes checked. Personal lifestyle choices, including: Daily care of your teeth and gums. Regular physical activity. Eating a healthy diet. Avoiding tobacco and drug use. Limiting alcohol use. Practicing safe sex. Taking low doses of aspirin every day. Taking vitamin and mineral supplements as recommended by your health care provider. What happens during an annual well check? The services and screenings done by your health care provider during your annual well check will depend on your age, overall health, lifestyle risk factors, and family  history of disease. Counseling  Your health care provider may ask you questions about your: Alcohol use. Tobacco use. Drug use. Emotional well-being. Home and relationship well-being. Sexual activity. Eating habits. History of falls. Memory and ability to understand (cognition). Work and work Astronomer. Screening  You may have the following tests or measurements: Height, weight, and BMI. Blood pressure. Lipid and cholesterol levels. These may be checked every 5 years, or more frequently if you are over 34 years old. Skin check. Lung cancer screening. You may have this screening every year starting at age 57 if you have a 30-pack-year history of smoking and currently smoke or have quit within the past 15 years. Fecal occult blood test (FOBT) of the stool. You may have this test every year starting at age 32. Flexible sigmoidoscopy or colonoscopy. You may have a sigmoidoscopy every 5 years or a colonoscopy every 10 years starting at age 31. Prostate cancer screening. Recommendations will vary depending on your family history and other risks. Hepatitis C blood test. Hepatitis B blood test. Sexually transmitted disease (STD) testing. Diabetes screening. This is done by checking your blood  sugar (glucose) after you have not eaten for a while (fasting). You may have this done every 1-3 years. Abdominal aortic aneurysm (AAA) screening. You may need this if you are a current or former smoker. Osteoporosis. You may be screened starting at age 75 if you are at high risk. Talk with your health care provider about your test results, treatment options, and if necessary, the need for more tests. Vaccines  Your health care provider may recommend certain vaccines, such as: Influenza vaccine. This is recommended every year. Tetanus, diphtheria, and acellular pertussis (Tdap, Td) vaccine. You may need a Td booster every 10 years. Zoster vaccine. You may need this after age 14. Pneumococcal 13-valent conjugate (PCV13) vaccine. One dose is recommended after age 86. Pneumococcal polysaccharide (PPSV23) vaccine. One dose is recommended after age 34. Talk to your health care provider about which screenings and vaccines you need and how often you need them. This information is not intended to replace advice given to you by your health care provider. Make sure you discuss any questions you have with your health care provider. Document Released: 04/15/2015 Document Revised: 12/07/2015 Document Reviewed: 01/18/2015 Elsevier Interactive Patient Education  2017 [employer].  Fall Prevention in the Home Falls can cause injuries. They can happen to people of all ages. There are many things you can do to make your home safe and to help prevent falls. What can I do on the outside of my home? Regularly fix the edges of walkways and driveways and fix any cracks. Remove anything that might make you trip as you walk through a door, such as a raised step or threshold. Trim any bushes or trees on the path to your home. Use bright outdoor lighting. Clear any walking paths of anything that might make someone trip, such as rocks or tools. Regularly check to see if handrails are loose or broken. Make sure that  both sides of any steps have handrails. Any raised decks and porches should have guardrails on the edges. Have any leaves, snow, or ice cleared regularly. Use sand or salt on walking paths during winter. Clean up any spills in your garage right away. This includes oil or grease spills. What can I do in the bathroom? Use night lights. Install grab bars by the toilet and in the tub and shower. Do not use towel bars as grab bars.  Use non-skid mats or decals in the tub or shower. If you need to sit down in the shower, use a plastic, non-slip stool. Keep the floor dry. Clean up any water that spills on the floor as soon as it happens. Remove soap buildup in the tub or shower regularly. Attach bath mats securely with double-sided non-slip rug tape. Do not have throw rugs and other things on the floor that can make you trip. What can I do in the bedroom? Use night lights. Make sure that you have a light by your bed that is easy to reach. Do not use any sheets or blankets that are too big for your bed. They should not hang down onto the floor. Have a firm chair that has side arms. You can use this for support while you get dressed. Do not have throw rugs and other things on the floor that can make you trip. What can I do in the kitchen? Clean up any spills right away. Avoid walking on wet floors. Keep items that you use a lot in easy-to-reach places. If you need to reach something above you, use a strong step stool that has a grab bar. Keep electrical cords out of the way. Do not use floor polish or wax that makes floors slippery. If you must use wax, use non-skid floor wax. Do not have throw rugs and other things on the floor that can make you trip. What can I do with my stairs? Do not leave any items on the stairs. Make sure that there are handrails on both sides of the stairs and use them. Fix handrails that are broken or loose. Make sure that handrails are as long as the stairways. Check  any carpeting to make sure that it is firmly attached to the stairs. Fix any carpet that is loose or worn. Avoid having throw rugs at the top or bottom of the stairs. If you do have throw rugs, attach them to the floor with carpet tape. Make sure that you have a light switch at the top of the stairs and the bottom of the stairs. If you do not have them, ask someone to add them for you. What else can I do to help prevent falls? Wear shoes that: Do not have high heels. Have rubber bottoms. Are comfortable and fit you well. Are closed at the toe. Do not wear sandals. If you use a stepladder: Make sure that it is fully opened. Do not climb a closed stepladder. Make sure that both sides of the stepladder are locked into place. Ask someone to hold it for you, if possible. Clearly mark and make sure that you can see: Any grab bars or handrails. First and last steps. Where the edge of each step is. Use tools that help you move around (mobility aids) if they are needed. These include: Canes. Walkers. Scooters. Crutches. Turn on the lights when you go into a dark area. Replace any light bulbs as soon as they burn out. Set up your furniture so you have a clear path. Avoid moving your furniture around. If any of your floors are uneven, fix them. If there are any pets around you, be aware of where they are. Review your medicines with your doctor. Some medicines can make you feel dizzy. This can increase your chance of falling. Ask your doctor what other things that you can do to help prevent falls. This information is not intended to replace advice given to you by your health care  provider. Make sure you discuss any questions you have with your health care provider. Document Released: 01/13/2009 Document Revised: 08/25/2015 Document Reviewed: 04/23/2014 Elsevier Interactive Patient Education  2017 [employer].

## 2022-10-02 NOTE — Progress Notes (Signed)
Subjective:   Alan [name] is a 77 y.o. male who presents for Medicare Annual/Subsequent preventive examination.  Visit Complete: Virtual  I connected with  Alan [name] on 10/02/22 by a audio enabled telemedicine application and verified that I am speaking with the correct person using two identifiers.  Patient Location: Home  Provider Location: Home Office  I discussed the limitations of evaluation and management by telemedicine. The patient expressed understanding and agreed to proceed.  Patient Medicare AWV questionnaire was completed by the patient on  ; I have confirmed that all information answered by patient is correct and no changes since this date.  Review of Systems      Cardiac Risk Factors include: advanced age (>74men, >55 women);male gender;hypertension     Objective:    Today's Vitals   10/02/22 0931  Weight: 243 lb (110.2 kg)  Height: 6' (1.829 m)   Body mass index is 32.96 kg/m.     10/02/2022    9:38 AM 04/11/2022    8:42 AM 04/05/2022    3:10 PM 08/08/2021    8:55 AM 11/07/2020   12:09 PM 07/10/2019    2:00 PM 10/18/2017   10:48 AM  Advanced Directives  Does Patient Have a Medical Advance Directive? No No No No No No No  Would patient like information on creating a medical advance directive? No - Patient declined No - Patient declined  No - Patient declined No - Patient declined No - Patient declined No - Patient declined    Current Medications (verified) Outpatient Encounter Medications as of 10/02/2022  Medication Sig   pantoprazole (PROTONIX) 40 MG tablet TAKE 1 TABLET BY MOUTH EVERY DAY FOR HEARTBURN   sertraline (ZOLOFT) 100 MG tablet TAKE 1 TABLET (100 MG TOTAL) BY MOUTH DAILY. FOR ANXIETY AND DEPRESSION.   acetaminophen (TYLENOL) 500 MG tablet Take 1,000 mg by mouth every 6 (six) hours as needed.   amLODipine (NORVASC) 10 MG tablet TAKE 1 TABLET BY MOUTH EVERY DAY FOR BLOOD PRESSURE   aspirin EC 81 MG tablet Take 1 tablet (81 mg total) by  mouth daily at 6 (six) AM. Swallow whole.   atorvastatin (LIPITOR) 80 MG tablet TAKE 1 TABLET BY MOUTH EVERY DAY for cholesterol.   clopidogrel (PLAVIX) 75 MG tablet TAKE 1 TABLET BY MOUTH EVERY DAY   cyclobenzaprine (FLEXERIL) 5 MG tablet Take 1 tablet (5 mg total) by mouth 3 (three) times daily as needed for muscle spasms.   fish oil-omega-3 fatty acids 1000 MG capsule Take 2 g by mouth daily.   fluticasone (FLONASE) 50 MCG/ACT nasal spray PLACE 1 SPRAY INTO BOTH NOSTRILS 2 (TWO) TIMES DAILY   ketoconazole (NIZORAL) 2 % shampoo as needed.   losartan (COZAAR) 100 MG tablet TAKE 1 TABLET BY MOUTH EVERY DAY FOR BLOOD PRESSURE   montelukast (SINGULAIR) 10 MG tablet TAKE 1 TABLET (10 MG TOTAL) BY MOUTH AT BEDTIME FOR ALLERGIES   Multiple Vitamin (MULTIVITAMIN) tablet Take 1 tablet by mouth daily.   oxyCODONE (OXY IR/ROXICODONE) 5 MG immediate release tablet Take 1 tablet (5 mg total) by mouth every 6 (six) hours as needed for moderate pain.   No facility-administered encounter medications on file as of 10/02/2022.    Allergies (verified) Patient has no known allergies.   History: Past Medical History:  Diagnosis Date   Allergic rhinitis, cause unspecified    Allergy    Anxiety    Cancer (HCC)    Basal cell skin cancer,left ear   Carotid  artery stenosis    left   Carpal tunnel syndrome    Chronic airway obstruction, not elsewhere classified    COPD with acute exacerbation (HCC) 10/02/2007   Qualifier: Diagnosis of  By: Maple Hudson MD, Clinton D    Coronary artery disease    Depression    Dyspnea    Esophageal reflux    Glaucoma (increased eye pressure)    History of kidney stones    Lipoma of unspecified site    Osteoarthrosis, unspecified whether generalized or localized, lower leg    Other and unspecified hyperlipidemia    Peripheral vascular disease (HCC)    Personal history of colonic polyps    Personal history of other malignant neoplasm of skin    PONV (postoperative nausea and  vomiting)    [facility]roke [facility])    " mini stroke"   Unspecified essential hypertension    Viral gastroenteritis 09/20/2021   Past Surgical History:  Procedure Laterality Date   BACK SURGERY     lumbar disc surgery   bone spur  06/2003   right thumb   CAROTID ANGIOGRAPHY Left 01/23/2022   Procedure: CAROTID ANGIOGRAPHY;  Surgeon: Renford Dills, MD;  Location: [facility] INVASIVE CV LAB;  Service: Cardiovascular;  Laterality: Left;   CATARACT EXTRACTION     COLONOSCOPY W/ POLYPECTOMY     COLONOSCOPY WITH ESOPHAGOGASTRODUODENOSCOPY (EGD)     ENDARTERECTOMY Left 04/11/2022   Procedure: ENDARTERECTOMY CAROTID;  Surgeon: Renford Dills, MD;  Location: [facility] ORS;  Service: Vascular;  Laterality: Left;   KNEE ARTHROSCOPY  09/2007   partial medial mesicecotmy, patellar chonfroplasty   SHOULDER SURGERY  03/24/2007   removal bone spur   SHOULDER SURGERY Left 07/2016   [facility], Dr. Olegario Shearer   TOTAL KNEE ARTHROPLASTY     03-2009 [facility]   TOTAL KNEE ARTHROPLASTY Left 12/2012   [facility]   Family History  Problem Relation Age of Onset   Diabetes Mother    Hyperlipidemia Mother    Hypertension Mother    Heart attack Mother    Obesity Mother    Hyperlipidemia Father    Hypertension Father    Lung cancer Father    Colon polyps Father    Hypertension Sister    Hyperlipidemia Sister    Lung cancer Sister    Hyperlipidemia Sister    Heart attack Brother    Diabetes Brother    Hyperlipidemia Brother    Hypertension Brother    Hyperlipidemia Brother    Hypertension Brother    Drug abuse Daughter    Colon cancer Neg Hx    Esophageal cancer Neg Hx    Pancreatic cancer Neg Hx    Rectal cancer Neg Hx    Social History   Socioeconomic History   Marital status: Divorced    Spouse name: Not on file   Number of children: 2   Years of education: Not on file   Highest education level: Not on file  Occupational History   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  Tobacco  Use   Smoking status: Former    Packs/day: 3.00    Years: 25.00    Additional pack years: 0.00    Total pack years: 75.00    Types: Cigarettes    Quit date: 11/23/1996    Years since quitting: 25.8   Smokeless tobacco: Never  Vaping Use   Vaping Use: Never used  Substance and Sexual Activity   Alcohol use: Not Currently    Alcohol/week: 0.0 standard drinks of  alcohol   Drug use: No   Sexual activity: Yes    Partners: Female    Comment: monogamous relationship  Other Topics Concern   Not on file  Social History Narrative   Regular exercise -- no            Social Determinants of Health   Financial Resource Strain: Low Risk  (10/02/2022)   Overall Financial Resource Strain (CARDIA)    Difficulty of Paying Living Expenses: Not hard at all  Food Insecurity: No Food Insecurity (10/02/2022)   Hunger Vital Sign    Worried About Running Out of Food in the Last Year: Never true    Ran Out of Food in the Last Year: Never true  Transportation Needs: No Transportation Needs (10/02/2022)   PRAPARE - Administrator, Civil Service (Medical): No    Lack of Transportation (Non-Medical): No  Physical Activity: Inactive (10/02/2022)   Exercise Vital Sign    Days of Exercise per Week: 0 days    Minutes of Exercise per Session: 0 min  Stress: No Stress Concern Present (10/02/2022)   [employer] of Occupational Health - Occupational Stress Questionnaire    Feeling of Stress : Not at all  Social Connections: Moderately Integrated (10/02/2022)   Social Connection and Isolation Panel [NHANES]    Frequency of Communication with Friends and Family: More than three times a week    Frequency of Social Gatherings with Friends and Family: More than three times a week    Attends Religious Services: More than 4 times per year    Active Member of [employer] or Organizations: Yes    Attends Engineer, structural: More than 4 times per year    Marital Status: Divorced    Tobacco  Counseling Counseling given: Not Answered   Clinical Intake:  Pre-visit preparation completed: No  Pain : No/denies pain     BMI - recorded: 32.96 Nutritional Status: BMI > 30  Obese Nutritional Risks: None Diabetes: No  How often do you need to have someone help you when you read instructions, pamphlets, or other written materials from your doctor or pharmacy?: 1 - Never  Interpreter Needed?: No  Information entered by :: Theresa Mulligan LPN   Activities of Daily Living    10/02/2022    9:37 AM 04/11/2022    3:00 PM  In your present state of health, do you have any difficulty performing the following activities:  Hearing? 0 0  Vision? 0 0  Difficulty concentrating or making decisions? 0 0  Walking or climbing stairs? 0 0  Dressing or bathing? 0 0  Doing errands, shopping? 0 0  Preparing Food and eating ? N   Using the Toilet? N   In the past six months, have you accidently leaked urine? N   Do you have problems with loss of bowel control? N   Managing your Medications? N   Managing your Finances? N   Housekeeping or managing your Housekeeping? N     Patient Care Team: Doreene Nest, NP as PCP - General (Nurse Practitioner) Marykay Lex, MD as PCP - Cardiology (Cardiology) Sallye Lat, MD as Consulting Physician (Ophthalmology) Elinor Parkinson, DPM as Consulting Physician (Podiatry) Teryl Lucy, MD as Consulting Physician (Orthopedic Surgery) Andrena Mews, DO as Consulting Physician (Sports Medicine) Bradly Bienenstock, MD as Consulting Physician (Orthopedic Surgery) [name], Latina Craver, MD (Vascular Surgery)  Indicate any recent Medical Services you may have received from other than [facility] providers  in the past year (date may be approximate).     Assessment:   This is a routine wellness examination for Alan [name].  Hearing/Vision screen Hearing Screening - Comments:: Denies hearing difficulties   Vision Screening - Comments:: Wears reading glasses  - up to date with routine eye exams with  Dr Dione Booze  Dietary issues and exercise activities discussed:     Goals Addressed               This Visit's Progress     Stay Healthy (pt-stated)         Depression Screen    10/02/2022    9:36 AM 07/05/2022    8:14 AM 11/07/2021    8:12 AM 08/08/2021    8:52 AM 06/22/2021   11:04 AM 10/21/2020    8:27 AM 08/31/2020    9:50 AM  PHQ 2/9 Scores  PHQ - 2 Score 0 0 0 0 0 0 0  PHQ- 9 Score  2 0 0 0 0 0    Fall Risk    10/02/2022    9:37 AM 07/05/2022    8:14 AM 08/08/2021    8:56 AM 08/31/2020    9:51 AM 07/10/2019    2:01 PM  Fall Risk   Falls in the past year? 1 0 0 0 0  Number falls in past yr: 0 0 0  0  Injury with Fall? 0 0 0  0  Risk for fall due to : No Fall Risks No Fall Risks No Fall Risks  Medication side effect  Follow up Falls prevention discussed Falls evaluation completed Falls evaluation completed  Falls prevention discussed;Falls evaluation completed    MEDICARE RISK AT HOME:  Medicare Risk at Home - 10/02/22 0941     Any stairs in or around the home? No    If so, are there any without handrails? No    Home free of loose throw rugs in walkways, pet beds, electrical cords, etc? Yes    Adequate lighting in your home to reduce risk of falls? Yes    Life alert? No    Use of a cane, walker or w/c? No    Grab bars in the bathroom? No    Shower chair or bench in shower? No    Elevated toilet seat or a handicapped toilet? No             TIMED UP AND GO:  Was the test performed?  No    Cognitive Function:    07/10/2019    2:05 PM 10/18/2017   10:32 AM 10/12/2016    9:54 AM  MMSE - Mini Mental State Exam  Orientation to time 5 5 5   Orientation to Place 5 5 5   Registration 3 3 3   Attention/ Calculation 5 0 0  Recall 3 3 1   Language- name 2 objects  0 0  Language- repeat 1 1 1   Language- follow 3 step command  3 3  Language- read & follow direction  0 0  Write a sentence  0 0  Copy design  0 0  Total score  20 18         10/02/2022    9:38 AM  6CIT Screen  What Year? 0 points  What month? 0 points  What time? 0 points  Count back from 20 0 points  Months in reverse 0 points  Repeat phrase 0 points  Total Score 0 points    Immunizations Immunization History  Administered Date(s)  Administered   COVID-19, mRNA, vaccine(Comirnaty)12 years and older 01/30/2022   Fluad Quad(high Dose 65+) 12/14/2019, 12/02/2020, 01/30/2022   Influenza Split 03/02/2011, 02/28/2013   Influenza Whole 01/24/2007, 01/06/2008, 02/02/2009   Influenza, High Dose Seasonal PF 12/26/2016, 12/22/2017   Influenza,inj,Quad PF,6+ Mos 12/02/2015   Influenza-Unspecified 03/22/2014, 01/05/2015   PFIZER Comirnaty(Gray Top)Covid-19 Tri-Sucrose Vaccine 08/08/2020   PFIZER(Purple Top)SARS-COV-2 Vaccination 04/27/2019, 05/18/2019, 01/21/2020   Pfizer Covid-19 Vaccine Bivalent Booster 68yrs & up 01/17/2021   Pneumococcal Conjugate-13 05/03/2014   Pneumococcal Polysaccharide-23 12/05/2011   Respiratory Syncytial Virus Vaccine,Recomb Aduvanted(Arexvy) 02/13/2022   Td 06/13/2006   Tdap 10/15/2016   Zoster Recombinant(Shingrix) 02/05/2020, 05/03/2020   Zoster, Live 09/03/2006    TDAP status: Up to date  Flu Vaccine status: Up to date  Pneumococcal vaccine status: Up to date  Covid-19 vaccine status: Completed vaccines  Qualifies for Shingles Vaccine? Yes   Zostavax completed Yes   Shingrix Completed?: Yes  Screening Tests Health Maintenance  Topic Date Due   INFLUENZA VACCINE  11/01/2022   Medicare Annual Wellness (AWV)  10/02/2023   DTaP/Tdap/Td (3 - Td or Tdap) 10/16/2026   Pneumonia Vaccine 98+ Years old  Completed   COVID-19 Vaccine  Completed   Hepatitis C Screening  Completed   Zoster Vaccines- Shingrix  Completed   HPV VACCINES  Aged Out   Colonoscopy  Discontinued    Health Maintenance  There are no preventive care reminders to display for this patient.   Colorectal cancer screening: No longer  required.   Lung Cancer Screening: (Low Dose CT Chest recommended if Age 36-80 years, 20 pack-year currently smoking OR have quit w/in 15years.) does not qualify.     Additional Screening:  Hepatitis C Screening: does qualify; Completed 12/02/15  Vision Screening: Recommended annual ophthalmology exams for early detection of glaucoma and other disorders of the eye. Is the patient up to date with their annual eye exam?  Yes  Who is the provider or what is the name of the office in which the patient attends annual eye exams? Dr Dione Booze If pt is not established with a provider, would they like to be referred to a provider to establish care? No .   Dental Screening: Recommended annual dental exams for proper oral hygiene   Community Resource Referral / Chronic Care Management:  CRR required this visit?  No   CCM required this visit?  No     Plan:     I have personally reviewed and noted the following in the patient's chart:   Medical and social history Use of alcohol, tobacco or illicit drugs  Current medications and supplements including opioid prescriptions. Patient is currently taking opioid prescriptions. Information provided to patient regarding non-opioid alternatives. Patient advised to discuss non-opioid treatment plan with their provider. Functional ability and status Nutritional status Physical activity Advanced directives List of other physicians Hospitalizations, surgeries, and ER visits in previous 12 months Vitals Screenings to include cognitive, depression, and falls Referrals and appointments  In addition, I have reviewed and discussed with patient certain preventive protocols, quality metrics, and best practice recommendations. A written personalized care plan for preventive services as well as general preventive health recommendations were provided to patient.     Tillie Rung, LPN   04/07/1094   After Visit Summary: (MyChart) Due to this being a  telephonic visit, the after visit summary with patients personalized plan was offered to patient via MyChart   Nurse Notes: Patient request f/u with concerns of increased memory loss.

## 2022-10-09 DIAGNOSIS — H43813 Vitreous degeneration, bilateral: Secondary | ICD-10-CM | POA: Diagnosis not present

## 2022-10-09 DIAGNOSIS — H34212 Partial retinal artery occlusion, left eye: Secondary | ICD-10-CM | POA: Diagnosis not present

## 2022-10-09 DIAGNOSIS — H4311 Vitreous hemorrhage, right eye: Secondary | ICD-10-CM | POA: Diagnosis not present

## 2022-10-09 DIAGNOSIS — H40013 Open angle with borderline findings, low risk, bilateral: Secondary | ICD-10-CM | POA: Diagnosis not present

## 2022-10-09 DIAGNOSIS — G453 Amaurosis fugax: Secondary | ICD-10-CM | POA: Diagnosis not present

## 2022-10-19 DIAGNOSIS — R238 Other skin changes: Secondary | ICD-10-CM | POA: Diagnosis not present

## 2022-10-19 DIAGNOSIS — L538 Other specified erythematous conditions: Secondary | ICD-10-CM | POA: Diagnosis not present

## 2022-10-19 DIAGNOSIS — B078 Other viral warts: Secondary | ICD-10-CM | POA: Diagnosis not present

## 2022-10-19 DIAGNOSIS — L820 Inflamed seborrheic keratosis: Secondary | ICD-10-CM | POA: Diagnosis not present

## 2022-11-04 ENCOUNTER — Other Ambulatory Visit: Payer: Self-pay | Admitting: Primary Care

## 2022-11-04 DIAGNOSIS — E782 Mixed hyperlipidemia: Secondary | ICD-10-CM

## 2022-11-04 NOTE — Telephone Encounter (Signed)
Patient is due for CPE/follow up in August, last CPE 11/07/21, this will be required prior to any further refills.  Please schedule, thank you!

## 2022-11-05 DIAGNOSIS — H4311 Vitreous hemorrhage, right eye: Secondary | ICD-10-CM | POA: Diagnosis not present

## 2022-11-05 DIAGNOSIS — H40013 Open angle with borderline findings, low risk, bilateral: Secondary | ICD-10-CM | POA: Diagnosis not present

## 2022-11-05 DIAGNOSIS — H34212 Partial retinal artery occlusion, left eye: Secondary | ICD-10-CM | POA: Diagnosis not present

## 2022-11-05 DIAGNOSIS — Z961 Presence of intraocular lens: Secondary | ICD-10-CM | POA: Diagnosis not present

## 2022-11-05 DIAGNOSIS — H43813 Vitreous degeneration, bilateral: Secondary | ICD-10-CM | POA: Diagnosis not present

## 2022-11-05 NOTE — Telephone Encounter (Signed)
Lvmtcb, sent mychart message  

## 2022-11-08 DIAGNOSIS — G453 Amaurosis fugax: Secondary | ICD-10-CM | POA: Diagnosis not present

## 2022-11-08 DIAGNOSIS — H43813 Vitreous degeneration, bilateral: Secondary | ICD-10-CM | POA: Diagnosis not present

## 2022-11-08 DIAGNOSIS — H40013 Open angle with borderline findings, low risk, bilateral: Secondary | ICD-10-CM | POA: Diagnosis not present

## 2022-11-08 DIAGNOSIS — H34212 Partial retinal artery occlusion, left eye: Secondary | ICD-10-CM | POA: Diagnosis not present

## 2022-11-08 DIAGNOSIS — H4311 Vitreous hemorrhage, right eye: Secondary | ICD-10-CM | POA: Diagnosis not present

## 2022-11-09 ENCOUNTER — Encounter: Payer: Self-pay | Admitting: Primary Care

## 2022-11-09 ENCOUNTER — Ambulatory Visit (INDEPENDENT_AMBULATORY_CARE_PROVIDER_SITE_OTHER): Payer: Medicare HMO | Admitting: Primary Care

## 2022-11-09 VITALS — BP 130/78 | HR 61 | Temp 97.7°F | Ht 69.75 in | Wt 248.0 lb

## 2022-11-09 DIAGNOSIS — Z0000 Encounter for general adult medical examination without abnormal findings: Secondary | ICD-10-CM

## 2022-11-09 DIAGNOSIS — R7303 Prediabetes: Secondary | ICD-10-CM

## 2022-11-09 DIAGNOSIS — K219 Gastro-esophageal reflux disease without esophagitis: Secondary | ICD-10-CM

## 2022-11-09 DIAGNOSIS — I10 Essential (primary) hypertension: Secondary | ICD-10-CM

## 2022-11-09 DIAGNOSIS — F411 Generalized anxiety disorder: Secondary | ICD-10-CM

## 2022-11-09 DIAGNOSIS — Z125 Encounter for screening for malignant neoplasm of prostate: Secondary | ICD-10-CM

## 2022-11-09 DIAGNOSIS — M542 Cervicalgia: Secondary | ICD-10-CM

## 2022-11-09 DIAGNOSIS — Z8673 Personal history of transient ischemic attack (TIA), and cerebral infarction without residual deficits: Secondary | ICD-10-CM

## 2022-11-09 DIAGNOSIS — J3089 Other allergic rhinitis: Secondary | ICD-10-CM | POA: Diagnosis not present

## 2022-11-09 DIAGNOSIS — I6522 Occlusion and stenosis of left carotid artery: Secondary | ICD-10-CM

## 2022-11-09 DIAGNOSIS — F32A Depression, unspecified: Secondary | ICD-10-CM

## 2022-11-09 DIAGNOSIS — I739 Peripheral vascular disease, unspecified: Secondary | ICD-10-CM

## 2022-11-09 DIAGNOSIS — E785 Hyperlipidemia, unspecified: Secondary | ICD-10-CM

## 2022-11-09 DIAGNOSIS — D696 Thrombocytopenia, unspecified: Secondary | ICD-10-CM | POA: Diagnosis not present

## 2022-11-09 DIAGNOSIS — M545 Low back pain, unspecified: Secondary | ICD-10-CM

## 2022-11-09 DIAGNOSIS — G8929 Other chronic pain: Secondary | ICD-10-CM

## 2022-11-09 LAB — CBC WITH DIFFERENTIAL/PLATELET
Basophils Absolute: 0 10*3/uL (ref 0.0–0.1)
Basophils Relative: 0.6 % (ref 0.0–3.0)
Eosinophils Absolute: 0.3 10*3/uL (ref 0.0–0.7)
Eosinophils Relative: 5.8 % — ABNORMAL HIGH (ref 0.0–5.0)
HCT: 36.7 % — ABNORMAL LOW (ref 39.0–52.0)
Hemoglobin: 12.1 g/dL — ABNORMAL LOW (ref 13.0–17.0)
Lymphocytes Relative: 28.9 % (ref 12.0–46.0)
Lymphs Abs: 1.4 10*3/uL (ref 0.7–4.0)
MCHC: 33 g/dL (ref 30.0–36.0)
MCV: 88.3 fl (ref 78.0–100.0)
Monocytes Absolute: 0.4 10*3/uL (ref 0.1–1.0)
Monocytes Relative: 8.4 % (ref 3.0–12.0)
Neutro Abs: 2.8 10*3/uL (ref 1.4–7.7)
Neutrophils Relative %: 56.3 % (ref 43.0–77.0)
Platelets: 134 10*3/uL — ABNORMAL LOW (ref 150.0–400.0)
RBC: 4.15 Mil/uL — ABNORMAL LOW (ref 4.22–5.81)
RDW: 14 % (ref 11.5–15.5)
WBC: 5 10*3/uL (ref 4.0–10.5)

## 2022-11-09 LAB — COMPREHENSIVE METABOLIC PANEL
ALT: 33 U/L (ref 0–53)
AST: 20 U/L (ref 0–37)
Albumin: 4.1 g/dL (ref 3.5–5.2)
Alkaline Phosphatase: 58 U/L (ref 39–117)
BUN: 17 mg/dL (ref 6–23)
CO2: 32 mEq/L (ref 19–32)
Calcium: 9.4 mg/dL (ref 8.4–10.5)
Chloride: 101 mEq/L (ref 96–112)
Creatinine, Ser: 0.94 mg/dL (ref 0.40–1.50)
GFR: 78.83 mL/min (ref >60.00)
Glucose, Bld: 109 mg/dL — ABNORMAL HIGH (ref 70–99)
Potassium: 4.2 mEq/L (ref 3.5–5.1)
Sodium: 139 mEq/L (ref 135–145)
Total Bilirubin: 0.4 mg/dL (ref 0.2–1.2)
Total Protein: 6.4 g/dL (ref 6.0–8.3)

## 2022-11-09 LAB — PSA, MEDICARE: PSA: 4.47 ng/ml — ABNORMAL HIGH (ref 0.10–4.00)

## 2022-11-09 LAB — LIPID PANEL
Cholesterol: 154 mg/dL (ref 0–200)
HDL: 50.8 mg/dL (ref >39.00)
LDL Cholesterol: 82 mg/dL (ref 0–99)
NonHDL: 103.04
Total CHOL/HDL Ratio: 3
Triglycerides: 104 mg/dL (ref 0.0–149.0)
VLDL: 20.8 mg/dL (ref 0.0–40.0)

## 2022-11-09 LAB — HEMOGLOBIN A1C: Hgb A1c MFr Bld: 6 % (ref 4.6–6.5)

## 2022-11-09 NOTE — Assessment & Plan Note (Signed)
Repeat CBC pending. 

## 2022-11-09 NOTE — Assessment & Plan Note (Signed)
No new symptoms.  Continue lipid control, blood pressure control, glucose control.

## 2022-11-09 NOTE — Assessment & Plan Note (Addendum)
 Controlled.  Continue amlodipine 10 mg daily, losartan 100 mg daily. CMP pending.

## 2022-11-09 NOTE — Assessment & Plan Note (Signed)
Continue atorvastatin 80 mg daily. Repeat lipid panel pending. 

## 2022-11-09 NOTE — Assessment & Plan Note (Signed)
Immunizations UTD.  Discussed Prevnar 20 for which she will set up a nurse visit for. Colonoscopy UTD, no further imaging needed according to last report PSA due and pending.  Discussed the importance of a healthy diet and regular exercise in order for weight loss, and to reduce the risk of further co-morbidity.  Exam stable. Labs pending.  Follow up in 1 year for repeat physical.

## 2022-11-09 NOTE — Assessment & Plan Note (Signed)
Stable.  Following with vascular services, reviewed office notes and carotid US from May 2024. Remain off Plavix. Continue aspirin 81 mg daily. Continue atorvastatin 80 mg daily.

## 2022-11-09 NOTE — Assessment & Plan Note (Signed)
Stable. No concerns today.  Continue cyclobenzaprine 5 mg as needed.

## 2022-11-09 NOTE — Assessment & Plan Note (Signed)
Controlled.  Continue montelukast 10 mg at bedtime.

## 2022-11-09 NOTE — Assessment & Plan Note (Signed)
Repeat A1C pending.  Discussed the importance of a healthy diet and regular exercise in order for weight loss, and to reduce the risk of further co-morbidity.  

## 2022-11-09 NOTE — Assessment & Plan Note (Signed)
Following with vascular services, reviewed office notes from May 2024.

## 2022-11-09 NOTE — Assessment & Plan Note (Signed)
Controlled.  Continue sertraline 100 mg daily.   

## 2022-11-09 NOTE — Assessment & Plan Note (Signed)
Stable.  No concerns today. Continue to monitor.  

## 2022-11-09 NOTE — Assessment & Plan Note (Signed)
Controlled.  Continue pantoprazole 40 mg daily. 

## 2022-11-09 NOTE — Progress Notes (Signed)
Subjective:    [name], male    DOB: [birth year], 77 y.o.   MRN: [id number]  HPI  Alan [name] is a very pleasant 77 y.o. male who presents today for complete physical and follow up of chronic conditions.  Immunizations: -Tetanus: Completed in 2018 -Shingles: Completed Shingrix series -Pneumonia: Completed Prevnar 13 in 2016 and pneumovax 23 in 2013  Diet: Fair diet.  Exercise: No regular exercise.  Eye exam: Completes annually  Dental exam: Completes semi-annually    Colonoscopy: Completed in 2022, no further screening needed from report.  PSA: Due  BP Readings from Last 3 Encounters:  11/09/22 130/78  08/20/22 126/76  08/01/22 120/60         Review of Systems  Constitutional:  Negative for unexpected weight change.  HENT:  Negative for rhinorrhea.   Respiratory:  Negative for cough and shortness of breath.   Cardiovascular:  Negative for chest pain.  Gastrointestinal:  Negative for constipation and diarrhea.  Genitourinary:  Negative for difficulty urinating.  Musculoskeletal:  Negative for arthralgias and myalgias.  Skin:  Negative for rash.  Allergic/Immunologic: Negative for environmental allergies.  Neurological:  Negative for dizziness and headaches.  Psychiatric/Behavioral:  The patient is not nervous/anxious.          Past Medical History:  Diagnosis Date   Allergic rhinitis, cause unspecified    Allergy    Anxiety    Cancer (HCC)    Basal cell skin cancer,left ear   Carotid artery stenosis    left   Carotid stenosis, symptomatic w/o infarct, left 04/11/2022   Carpal tunnel syndrome    Chronic airway obstruction, not elsewhere classified    Chronic cough 05/25/2016   COPD with acute exacerbation (HCC) 10/02/2007   Qualifier: Diagnosis of  By: Maple Hudson MD, Clinton D    Coronary artery disease    Depression    Dizziness 12/02/2020   Dyspnea    Esophageal reflux    Glaucoma (increased eye pressure)    History of  kidney stones    Lipoma of unspecified site    Osteoarthrosis, unspecified whether generalized or localized, lower leg    Other and unspecified hyperlipidemia    Paresthesia of left leg 11/17/2019   Peripheral vascular disease (HCC)    Personal history of colonic polyps    Personal history of other malignant neoplasm of skin    PONV (postoperative nausea and vomiting)    Stroke (HCC)    " mini stroke"   Unspecified essential hypertension    Viral gastroenteritis 09/20/2021    Social History   Socioeconomic History   Marital status: Divorced    Spouse name: Not on file   Number of children: 2   Years of education: Not on file   Highest education level: 12th grade  Occupational History   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  Tobacco Use   Smoking status: Former    Current packs/day: 0.00    Average packs/day: 3.0 packs/day for 25.0 years (75.0 ttl pk-yrs)    Types: Cigarettes    Start date: 11/24/1971    Quit date: 11/23/1996    Years since quitting: 25.9   Smokeless tobacco: Never  Vaping Use   Vaping status: Never Used  Substance and Sexual Activity   Alcohol use: Not Currently    Alcohol/week: 0.0 standard drinks of alcohol   Drug use: No   Sexual activity: Yes    Partners: Female    Comment: monogamous relationship  Other Topics  Concern   Not on file  Social History Narrative   Regular exercise -- no            Social Determinants of Health   Financial Resource Strain: Low Risk  (11/08/2022)   Overall Financial Resource Strain (CARDIA)    Difficulty of Paying Living Expenses: Not hard at all  Food Insecurity: No Food Insecurity (11/08/2022)   Hunger Vital Sign    Worried About Running Out of Food in the Last Year: Never true    Ran Out of Food in the Last Year: Never true  Transportation Needs: No Transportation Needs (11/08/2022)   PRAPARE - Administrator, Civil Service (Medical): No    Lack of Transportation (Non-Medical): No  Physical  Activity: Insufficiently Active (11/08/2022)   Exercise Vital Sign    Days of Exercise per Week: 1 day    Minutes of Exercise per Session: 10 min  Stress: No Stress Concern Present (11/08/2022)   [employer] of Occupational Health - Occupational Stress Questionnaire    Feeling of Stress : Only a little  Social Connections: Moderately Integrated (11/08/2022)   Social Connection and Isolation Panel [NHANES]    Frequency of Communication with Friends and Family: More than three times a week    Frequency of Social Gatherings with Friends and Family: More than three times a week    Attends Religious Services: More than 4 times per year    Active Member of Clubs or Organizations: Yes    Attends Banker Meetings: More than 4 times per year    Marital Status: Divorced  Intimate Partner Violence: Not At Risk (10/02/2022)   Humiliation, Afraid, Rape, and Kick questionnaire    Fear of Current or Ex-Partner: No    Emotionally Abused: No    Physically Abused: No    Sexually Abused: No    Past Surgical History:  Procedure Laterality Date   BACK SURGERY     lumbar disc surgery   bone spur  06/2003   right thumb   CAROTID ANGIOGRAPHY Left 01/23/2022   Procedure: CAROTID ANGIOGRAPHY;  Surgeon: Renford Dills, MD;  Location: [facility];  Service: Cardiovascular;  Laterality: Left;   CATARACT EXTRACTION     COLONOSCOPY W/ POLYPECTOMY     COLONOSCOPY WITH ESOPHAGOGASTRODUODENOSCOPY (EGD)     ENDARTERECTOMY Left 04/11/2022   Procedure: ENDARTERECTOMY CAROTID;  Surgeon: Renford Dills, MD;  Location: [facility];  Service: Vascular;  Laterality: Left;   KNEE ARTHROSCOPY  09/2007   partial medial mesicecotmy, patellar chonfroplasty   SHOULDER SURGERY  03/24/2007   removal bone spur   SHOULDER SURGERY Left 07/2016   [facility], Dr. Olegario Shearer   TOTAL KNEE ARTHROPLASTY     03-2009 [facility]   TOTAL KNEE ARTHROPLASTY Left 12/2012   [facility]    Family History   Problem Relation Age of Onset   Diabetes Mother    Hyperlipidemia Mother    Hypertension Mother    Heart attack Mother    Obesity Mother    Hyperlipidemia Father    Hypertension Father    Lung cancer Father    Colon polyps Father    Hypertension Sister    Hyperlipidemia Sister    Lung cancer Sister    Hyperlipidemia Sister    Heart attack Brother    Diabetes Brother    Hyperlipidemia Brother    Hypertension Brother    Hyperlipidemia Brother    Hypertension Brother    Drug  abuse Daughter    Colon cancer Neg Hx    Esophageal cancer Neg Hx    Pancreatic cancer Neg Hx    Rectal cancer Neg Hx     No Known Allergies  Current Outpatient Medications on File Prior to Visit  Medication Sig Dispense Refill   acetaminophen (TYLENOL) 500 MG tablet Take 1,000 mg by mouth every 6 (six) hours as needed.     amLODipine (NORVASC) 10 MG tablet TAKE 1 TABLET BY MOUTH EVERY DAY FOR BLOOD PRESSURE 90 tablet 3   aspirin EC 81 MG tablet Take 1 tablet (81 mg total) by mouth daily at 6 (six) AM. Swallow whole. 30 tablet 11   atorvastatin (LIPITOR) 80 MG tablet TAKE 1 TABLET BY MOUTH EVERY DAY FOR CHOLESTEROL 90 tablet 0   cyclobenzaprine (FLEXERIL) 5 MG tablet Take 1 tablet (5 mg total) by mouth 3 (three) times daily as needed for muscle spasms. 15 tablet 0   fish oil-omega-3 fatty acids 1000 MG capsule Take 2 g by mouth daily.     fluticasone (FLONASE) 50 MCG/ACT nasal spray PLACE 1 SPRAY INTO BOTH NOSTRILS 2 (TWO) TIMES DAILY 48 mL 0   ketoconazole (NIZORAL) 2 % shampoo as needed.     losartan (COZAAR) 100 MG tablet TAKE 1 TABLET BY MOUTH EVERY DAY FOR BLOOD PRESSURE 90 tablet 2   montelukast (SINGULAIR) 10 MG tablet TAKE 1 TABLET (10 MG TOTAL) BY MOUTH AT BEDTIME FOR ALLERGIES 90 tablet 0   Multiple Vitamin (MULTIVITAMIN) tablet Take 1 tablet by mouth daily.     pantoprazole (PROTONIX) 40 MG tablet TAKE 1 TABLET BY MOUTH EVERY DAY FOR HEARTBURN 90 tablet 0   sertraline (ZOLOFT) 100 MG tablet  TAKE 1 TABLET (100 MG TOTAL) BY MOUTH DAILY. FOR ANXIETY AND DEPRESSION. 90 tablet 0   No current facility-administered medications on file prior to visit.    BP 130/78 (BP Location: Left Arm, Patient Position: Sitting, Cuff Size: Large)   Pulse 61   Temp 97.7 F (36.5 C)   Ht 5' 9.75" (1.772 m)   Wt 248 lb (112.5 kg)   SpO2 98%   BMI 35.84 kg/m  Objective:   Physical Exam HENT:     Right Ear: Tympanic membrane and ear canal normal.     Left Ear: Tympanic membrane and ear canal normal.     Nose: Nose normal.     Right Sinus: No maxillary sinus tenderness or frontal sinus tenderness.     Left Sinus: No maxillary sinus tenderness or frontal sinus tenderness.  Eyes:     Conjunctiva/sclera: Conjunctivae normal.  Neck:     Thyroid: No thyromegaly.     Vascular: No carotid bruit.  Cardiovascular:     Rate and Rhythm: Normal rate and regular rhythm.     Heart sounds: Normal heart sounds.  Pulmonary:     Effort: Pulmonary effort is normal.     Breath sounds: Normal breath sounds. No wheezing or rales.  Abdominal:     General: Bowel sounds are normal.     Palpations: Abdomen is soft.     Tenderness: There is no abdominal tenderness.  Musculoskeletal:        General: Normal range of motion.     Cervical back: Neck supple.  Skin:    General: Skin is warm and dry.  Neurological:     Mental Status: He is alert and oriented to person, place, and time.     Cranial Nerves: No cranial nerve deficit.  Deep Tendon Reflexes: Reflexes are normal and symmetric.  Psychiatric:        Mood and Affect: Mood normal.           Assessment & Plan:  Preventative health care Assessment & Plan: Immunizations UTD.  Discussed Prevnar 20 for which she will set up a nurse visit for. Colonoscopy UTD, no further imaging needed according to last report PSA due and pending.  Discussed the importance of a healthy diet and regular exercise in order for weight loss, and to reduce the risk of  further co-morbidity.  Exam stable. Labs pending.  Follow up in 1 year for repeat physical.    Stenosis of left carotid artery Assessment & Plan: Stable.  Following with vascular services, reviewed office notes and carotid US from May 2024. Remain off Plavix. Continue aspirin 81 mg daily. Continue atorvastatin 80 mg daily.   Essential hypertension Assessment & Plan: Controlled.  Continue amlodipine 10 mg daily, losartan 100 mg daily.  CMP pending.   PAD (peripheral artery disease) (HCC) Assessment & Plan: Following with vascular services, reviewed office notes from May 2024.   Non-seasonal allergic rhinitis, unspecified trigger Assessment & Plan: Controlled.  Continue montelukast 10 mg at bedtime.   Gastroesophageal reflux disease, unspecified whether esophagitis present Assessment & Plan: Controlled.  Continue pantoprazole 40 mg daily.   Chronic bilateral low back pain without sciatica Assessment & Plan: Stable.  No concerns today. Continue to monitor.   Chronic neck pain Assessment & Plan: Stable. No concerns today.  Continue cyclobenzaprine 5 mg as needed.   Depression, unspecified depression type Assessment & Plan: Controlled.  Continue sertraline 100 mg daily.   Prediabetes Assessment & Plan: Repeat A1C pending.  Discussed the importance of a healthy diet and regular exercise in order for weight loss, and to reduce the risk of further co-morbidity.   Orders: -     Hemoglobin A1c  Hyperlipidemia LDL goal <70 Assessment & Plan: Continue atorvastatin 80 mg daily. Repeat lipid panel pending.  Orders: -     Lipid panel -     Comprehensive metabolic panel  History of CVA (cerebrovascular accident) Assessment & Plan: No new symptoms.  Continue lipid control, blood pressure control, glucose control.   GAD (generalized anxiety disorder) Assessment & Plan: Controlled.  Continue sertraline 100 mg daily.   Screening for  prostate cancer -     PSA, Medicare  Thrombocytopenia (HCC) Assessment & Plan: Repeat CBC pending.  Orders: -     CBC with Differential/Platelet        Doreene Nest, NP

## 2022-11-09 NOTE — Patient Instructions (Signed)
Stop by the lab prior to leaving today. I will notify you of your results once received.   Schedule a nurse visit to complete your Prevnar 20 pneumonia vaccine.  It was a pleasure to see you today!

## 2022-11-13 ENCOUNTER — Other Ambulatory Visit: Payer: Self-pay | Admitting: Primary Care

## 2022-11-13 DIAGNOSIS — D696 Thrombocytopenia, unspecified: Secondary | ICD-10-CM

## 2022-11-16 DIAGNOSIS — L538 Other specified erythematous conditions: Secondary | ICD-10-CM | POA: Diagnosis not present

## 2022-11-16 DIAGNOSIS — L821 Other seborrheic keratosis: Secondary | ICD-10-CM | POA: Diagnosis not present

## 2022-11-16 DIAGNOSIS — L820 Inflamed seborrheic keratosis: Secondary | ICD-10-CM | POA: Diagnosis not present

## 2022-11-16 DIAGNOSIS — B078 Other viral warts: Secondary | ICD-10-CM | POA: Diagnosis not present

## 2022-11-18 ENCOUNTER — Other Ambulatory Visit: Payer: Self-pay | Admitting: Primary Care

## 2022-11-18 DIAGNOSIS — I10 Essential (primary) hypertension: Secondary | ICD-10-CM

## 2022-11-26 ENCOUNTER — Inpatient Hospital Stay: Payer: Medicare HMO | Attending: Oncology | Admitting: Oncology

## 2022-11-26 ENCOUNTER — Encounter: Payer: Self-pay | Admitting: Oncology

## 2022-11-26 ENCOUNTER — Inpatient Hospital Stay: Payer: Medicare HMO

## 2022-11-26 VITALS — BP 121/71 | HR 64 | Temp 98.4°F | Resp 18 | Ht 69.75 in | Wt 249.7 lb

## 2022-11-26 DIAGNOSIS — D649 Anemia, unspecified: Secondary | ICD-10-CM | POA: Insufficient documentation

## 2022-11-26 DIAGNOSIS — Z862 Personal history of diseases of the blood and blood-forming organs and certain disorders involving the immune mechanism: Secondary | ICD-10-CM

## 2022-11-26 DIAGNOSIS — D696 Thrombocytopenia, unspecified: Secondary | ICD-10-CM

## 2022-11-26 LAB — VITAMIN B12: Vitamin B-12: 509 pg/mL (ref 180–914)

## 2022-11-26 LAB — CBC WITH DIFFERENTIAL/PLATELET
Abs Immature Granulocytes: 0.01 10*3/uL (ref 0.00–0.07)
Basophils Absolute: 0 10*3/uL (ref 0.0–0.1)
Basophils Relative: 1 %
Eosinophils Absolute: 0.3 10*3/uL (ref 0.0–0.5)
Eosinophils Relative: 5 %
HCT: 36.9 % — ABNORMAL LOW (ref 39.0–52.0)
Hemoglobin: 11.8 g/dL — ABNORMAL LOW (ref 13.0–17.0)
Immature Granulocytes: 0 %
Lymphocytes Relative: 24 %
Lymphs Abs: 1.3 10*3/uL (ref 0.7–4.0)
MCH: 29.2 pg (ref 26.0–34.0)
MCHC: 32 g/dL (ref 30.0–36.0)
MCV: 91.3 fL (ref 80.0–100.0)
Monocytes Absolute: 0.5 10*3/uL (ref 0.1–1.0)
Monocytes Relative: 9 %
Neutro Abs: 3.4 10*3/uL (ref 1.7–7.7)
Neutrophils Relative %: 61 %
Platelets: 130 10*3/uL — ABNORMAL LOW (ref 150–400)
RBC: 4.04 MIL/uL — ABNORMAL LOW (ref 4.22–5.81)
RDW: 12.7 % (ref 11.5–15.5)
WBC: 5.6 10*3/uL (ref 4.0–10.5)
nRBC: 0 % (ref 0.0–0.2)

## 2022-11-26 LAB — FOLATE: Folate: 28 ng/mL (ref >5.9)

## 2022-11-26 LAB — IRON AND TIBC
Iron: 43 ug/dL — ABNORMAL LOW (ref 45–182)
Saturation Ratios: 11 % — ABNORMAL LOW (ref 17.9–39.5)
TIBC: 382 ug/dL (ref 250–450)
UIBC: 339 ug/dL

## 2022-11-26 LAB — HEPATITIS C ANTIBODY: HCV Ab: NONREACTIVE

## 2022-11-26 LAB — HIV ANTIBODY (ROUTINE TESTING W REFLEX): HIV Screen 4th Generation wRfx: NONREACTIVE

## 2022-11-26 LAB — FERRITIN: Ferritin: 11 ng/mL — ABNORMAL LOW (ref 24–336)

## 2022-11-28 ENCOUNTER — Other Ambulatory Visit: Payer: Self-pay

## 2022-11-28 ENCOUNTER — Other Ambulatory Visit: Payer: Self-pay | Admitting: *Deleted

## 2022-11-28 DIAGNOSIS — D649 Anemia, unspecified: Secondary | ICD-10-CM | POA: Diagnosis not present

## 2022-11-28 DIAGNOSIS — Z862 Personal history of diseases of the blood and blood-forming organs and certain disorders involving the immune mechanism: Secondary | ICD-10-CM

## 2022-11-28 DIAGNOSIS — D696 Thrombocytopenia, unspecified: Secondary | ICD-10-CM | POA: Diagnosis not present

## 2022-11-29 ENCOUNTER — Other Ambulatory Visit: Payer: Self-pay | Admitting: Primary Care

## 2022-11-29 DIAGNOSIS — I10 Essential (primary) hypertension: Secondary | ICD-10-CM

## 2022-11-30 LAB — H. PYLORI ANTIGEN, STOOL: H. Pylori Stool Ag, Eia: NEGATIVE

## 2022-12-03 NOTE — Progress Notes (Signed)
Hematology/Oncology Consult note [facility] [phone] [phone]  Patient Care Team: Doreene Nest, NP as PCP - General (Nurse Practitioner) Marykay Lex, MD as PCP - Cardiology (Cardiology) Sallye Lat, MD as Consulting Physician (Ophthalmology) Elinor Parkinson, [place] as Consulting Physician (Podiatry) Teryl Lucy, MD as Consulting Physician (Orthopedic Surgery) Andrena Mews, DO as Consulting Physician (Sports Medicine) Bradly Bienenstock, MD as Consulting Physician (Orthopedic Surgery) [name], Latina Craver, MD (Vascular Surgery) Creig Hines, MD as Consulting Physician (Oncology)   Name of the patient: Alan [name]  [id number]  Dec 16, 1945    Reason for referral- thrombocytopenia   Referring physician- Vernona Rieger NP  Date of visit: 12/03/22   History of presenting illness- Patient is a 77 year old male with a past medical history significant for coronary artery disease, carotid stenosis COPD among other medical problems who has been referred for thrombocytopenia patient has had some longstanding thrombocytopenia with a platelet count that has fluctuated between 1 30-1 40 at least dating back to 2022.  White count is normal and his hemoglobin over the last 1 year has been between 10-11.  He denies any bleeding symptoms.  No recent changes in his medications.  ECOG PS- 1  Pain scale- 0   Review of systems- Review of Systems  Constitutional:  Positive for malaise/fatigue. Negative for chills, fever and weight loss.  HENT:  Negative for congestion, ear discharge and nosebleeds.   Eyes:  Negative for blurred vision.  Respiratory:  Negative for cough, hemoptysis, sputum production, shortness of breath and wheezing.   Cardiovascular:  Negative for chest pain, palpitations, orthopnea and claudication.  Gastrointestinal:  Negative for abdominal pain, blood in stool, constipation, diarrhea, heartburn, melena, nausea and  vomiting.  Genitourinary:  Negative for dysuria, flank pain, frequency, hematuria and urgency.  Musculoskeletal:  Negative for back pain, joint pain and myalgias.  Skin:  Negative for rash.  Neurological:  Negative for dizziness, tingling, focal weakness, seizures, weakness and headaches.  Endo/Heme/Allergies:  Does not bruise/bleed easily.  Psychiatric/Behavioral:  Negative for depression and suicidal ideas. The patient does not have insomnia.     No Known Allergies  Patient Active Problem List   Diagnosis Date Noted   Thrombocytopenia (HCC) 11/09/2022   Frequent headaches 11/07/2021   PAD (peripheral artery disease) (HCC) 06/12/2021   History of CVA (cerebrovascular accident) 06/02/2021   Diplopia 05/30/2021   PVD (peripheral vascular disease) (HCC) 05/30/2021   History of colonic polyps 02/21/2021   Carotid artery stenosis 12/02/2020   Pain of left hand 06/10/2020   Nocturia 11/17/2019   Bilateral lower extremity edema 06/04/2019   Chronic neck pain 06/04/2019   Chronic right shoulder pain 06/04/2019   Glaucoma (increased eye pressure) 04/28/2018   Glaucoma 04/28/2018   History of alcohol abuse 04/08/2018   Preventative health care 10/25/2017   Lumbar radiculopathy 07/30/2017   Tremor 10/19/2016   Obesity (BMI 30.0-34.9) 01/10/2016   Medicare annual wellness visit, subsequent 05/06/2015   Depression 05/06/2015   Prediabetes 09/01/2014   Chronic back pain 09/20/2009   VENTRAL HERNIA 10/27/2008   Bilateral chronic knee pain 07/23/2008   Allergic rhinitis 07/21/2007   GAD (generalized anxiety disorder) 07/02/2007   Lipoma 12/24/2006   Hyperlipidemia LDL goal <70 09/30/2006   CARPAL TUNNEL SYNDROME, BILATERAL 09/30/2006   Essential hypertension 09/30/2006   GERD 09/30/2006   SKIN CANCER, HX OF 09/30/2006     Past Medical History:  Diagnosis Date   Allergic rhinitis, cause unspecified    Allergy  Anxiety    Cancer (HCC)    Basal cell skin cancer,left ear    Carotid artery stenosis    left   Carotid stenosis, symptomatic w/o infarct, left 04/11/2022   Carpal tunnel syndrome    Chronic airway obstruction, not elsewhere classified    Chronic cough 05/25/2016   COPD with acute exacerbation (HCC) 10/02/2007   Qualifier: Diagnosis of  By: Maple Hudson MD, Clinton D    Coronary artery disease    Depression    Dizziness 12/02/2020   Dyspnea    Esophageal reflux    Glaucoma (increased eye pressure)    History of kidney stones    Lipoma of unspecified site    Osteoarthrosis, unspecified whether generalized or localized, lower leg    Other and unspecified hyperlipidemia    Paresthesia of left leg 11/17/2019   Peripheral vascular disease (HCC)    Personal history of colonic polyps    Personal history of other malignant neoplasm of skin    PONV (postoperative nausea and vomiting)    Stroke (HCC)    " mini stroke"   Unspecified essential hypertension    Viral gastroenteritis 09/20/2021     Past Surgical History:  Procedure Laterality Date   BACK SURGERY     lumbar disc surgery   bone spur  06/2003   right thumb   CAROTID ANGIOGRAPHY Left 01/23/2022   Procedure: CAROTID ANGIOGRAPHY;  Surgeon: Renford Dills, MD;  Location: [facility];  Service: Cardiovascular;  Laterality: Left;   CATARACT EXTRACTION     COLONOSCOPY W/ POLYPECTOMY     COLONOSCOPY WITH ESOPHAGOGASTRODUODENOSCOPY (EGD)     ENDARTERECTOMY Left 04/11/2022   Procedure: ENDARTERECTOMY CAROTID;  Surgeon: Renford Dills, MD;  Location: [facility];  Service: Vascular;  Laterality: Left;   KNEE ARTHROSCOPY  09/2007   partial medial mesicecotmy, patellar chonfroplasty   SHOULDER SURGERY  03/24/2007   removal bone spur   SHOULDER SURGERY Left 07/2016   [facility], Dr. 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  Tobacco Use   Smoking status: Former    Current packs/day: 0.00    Average packs/day: 3.0 packs/day for 25.0 years (75.0 ttl pk-yrs)    Types: Cigarettes    Start date: 11/24/1971    Quit date: 11/23/1996    Years since quitting: 26.0   Smokeless tobacco: Never  Vaping Use   Vaping status: Never Used  Substance and Sexual Activity   Alcohol use: Not Currently    Alcohol/week: 0.0 standard drinks of alcohol   Drug use: No   Sexual activity: Yes    Partners: Female    Comment: monogamous relationship  Other Topics Concern   Not on file  Social History Narrative   Regular exercise -- no            Social Determinants of Health   Financial Resource Strain: Low Risk  (11/08/2022)   Overall Financial Resource Strain (CARDIA)    Difficulty of Paying Living Expenses: Not hard at all  Food Insecurity: No Food Insecurity (11/26/2022)   Hunger Vital Sign  Worried About Programme researcher, broadcasting/film/video in the Last Year: Never true    Ran Out of Food in the Last Year: Never true  Transportation Needs: No Transportation Needs (11/26/2022)   PRAPARE - Administrator, Civil Service (Medical): No    Lack of Transportation (Non-Medical): No  Physical Activity: Insufficiently Active (11/08/2022)   Exercise Vital Sign    Days of Exercise per Week: 1 day    Minutes of Exercise per Session: 10 min  Stress: No Stress Concern Present (11/08/2022)   [employer] of Occupational Health - Occupational Stress Questionnaire    Feeling of Stress : Only a little  Social Connections: Moderately Integrated (11/08/2022)   Social Connection and Isolation Panel [NHANES]    Frequency of Communication with Friends and Family: More than three times a week    Frequency of Social Gatherings with Friends and Family: More than three  times a week    Attends Religious Services: More than 4 times per year    Active Member of [employer] or Organizations: Yes    Attends Engineer, structural: More than 4 times per year    Marital Status: Divorced  Intimate Partner Violence: Not At Risk (11/26/2022)   Humiliation, Afraid, Rape, and Kick questionnaire    Fear of Current or Ex-Partner: No    Emotionally Abused: No    Physically Abused: No    Sexually Abused: No     Family History  Problem Relation Age of Onset   Diabetes Mother    Hyperlipidemia Mother    Hypertension Mother    Heart attack Mother    Obesity Mother    Hyperlipidemia Father    Hypertension Father    Lung cancer Father    Colon polyps Father    Hypertension Sister    Hyperlipidemia Sister    Lung cancer Sister    Hyperlipidemia Sister    Heart attack Brother    Diabetes Brother    Hyperlipidemia Brother    Hypertension Brother    Hyperlipidemia Brother    Hypertension Brother    Drug abuse Daughter    Colon cancer Neg Hx    Esophageal cancer Neg Hx    Pancreatic cancer Neg Hx    Rectal cancer Neg Hx      Current Outpatient Medications:    acetaminophen (TYLENOL) 500 MG tablet, Take 1,000 mg by mouth every 6 (six) hours as needed., Disp: , Rfl:    amLODipine (NORVASC) 10 MG tablet, TAKE 1 TABLET BY MOUTH EVERY DAY FOR BLOOD PRESSURE, Disp: 90 tablet, Rfl: 3   aspirin EC 81 MG tablet, Take 1 tablet (81 mg total) by mouth daily at 6 (six) AM. Swallow whole., Disp: 30 tablet, Rfl: 11   atorvastatin (LIPITOR) 80 MG tablet, TAKE 1 TABLET BY MOUTH EVERY DAY FOR CHOLESTEROL, Disp: 90 tablet, Rfl: 0   cyclobenzaprine (FLEXERIL) 5 MG tablet, Take 1 tablet (5 mg total) by mouth 3 (three) times daily as needed for muscle spasms., Disp: 15 tablet, Rfl: 0   fish oil-omega-3 fatty acids 1000 MG capsule, Take 2 g by mouth daily., Disp: , Rfl:    fluticasone (FLONASE) 50 MCG/ACT nasal spray, PLACE 1 SPRAY INTO BOTH NOSTRILS 2 (TWO) TIMES DAILY, Disp: 48  mL, Rfl: 0   ketoconazole (NIZORAL) 2 % shampoo, as needed., Disp: , Rfl:    montelukast (SINGULAIR) 10 MG tablet, TAKE 1 TABLET (10 MG TOTAL) BY MOUTH AT BEDTIME FOR ALLERGIES, Disp: 90 tablet, Rfl: 0  Multiple Vitamin (MULTIVITAMIN) tablet, Take 1 tablet by mouth daily., Disp: , Rfl:    pantoprazole (PROTONIX) 40 MG tablet, TAKE 1 TABLET BY MOUTH EVERY DAY FOR HEARTBURN, Disp: 90 tablet, Rfl: 0   sertraline (ZOLOFT) 100 MG tablet, TAKE 1 TABLET (100 MG TOTAL) BY MOUTH DAILY. FOR ANXIETY AND DEPRESSION., Disp: 90 tablet, Rfl: 0   losartan (COZAAR) 100 MG tablet, TAKE 1 TABLET BY MOUTH EVERY DAY FOR BLOOD PRESSURE, Disp: 90 tablet, Rfl: 2   Physical exam:  Vitals:   11/26/22 1324  BP: 121/71  Pulse: 64  Resp: 18  Temp: 98.4 F (36.9 C)  TempSrc: Tympanic  SpO2: 96%  Weight: 249 lb 11.2 oz (113.3 kg)  Height: 5' 9.75" (1.772 m)   Physical Exam Cardiovascular:     Rate and Rhythm: Normal rate and regular rhythm.     Heart sounds: Normal heart sounds.  Pulmonary:     Effort: Pulmonary effort is normal.     Breath sounds: Normal breath sounds.  Abdominal:     General: Bowel sounds are normal.     Palpations: Abdomen is soft.  Skin:    General: Skin is warm and dry.  Neurological:     Mental Status: He is alert and oriented to person, place, and time.           Latest Ref Rng & Units 11/09/2022    8:56 AM  CMP  Glucose 70 - 99 mg/dL 829   BUN 6 - 23 mg/dL 17   Creatinine 5.62 - 1.50 mg/dL 1.30   Sodium 865 - 784 mEq/L 139   Potassium 3.5 - 5.1 mEq/L 4.2   Chloride 96 - 112 mEq/L 101   CO2 19 - 32 mEq/L 32   Calcium 8.4 - 10.5 mg/dL 9.4   Total Protein 6.0 - 8.3 g/dL 6.4   Total Bilirubin 0.2 - 1.2 mg/dL 0.4   Alkaline Phos 39 - 117 U/L 58   AST 0 - 37 U/L 20   ALT 0 - 53 U/L 33       Latest Ref Rng & Units 11/26/2022    2:43 PM  CBC  WBC 4.0 - 10.5 K/uL 5.6   Hemoglobin 13.0 - 17.0 g/dL 69.6   Hematocrit 29.5 - 52.0 % 36.9   Platelets 150 - 400 K/uL 130      Assessment and plan- Patient is a 76 y.o. male referred for thrombocytopenia   Thrombocytopenia: I will check B12 folate HIV and hepatitis C testing today.  I will also be checking for stool H. pylori antigen  Patient has mild normocytic anemia for which I will be checking ferritin and iron studies in addition to above anemia workup.  I will see him for an in person visit in 2 weeks time  Thank you for this kind referral and the opportunity to participate in the care of this  Patient   Visit Diagnosis 1. History of iron deficiency anemia   2. Thrombocytopenia (HCC)     Dr. Owens Shark, MD, MPH [facility] [phone] 12/03/2022

## 2022-12-12 ENCOUNTER — Inpatient Hospital Stay: Payer: Medicare HMO | Attending: Oncology | Admitting: Oncology

## 2022-12-12 ENCOUNTER — Encounter: Payer: Self-pay | Admitting: Oncology

## 2022-12-12 VITALS — BP 123/73 | HR 60 | Temp 97.2°F | Resp 18 | Wt 252.0 lb

## 2022-12-12 DIAGNOSIS — Z801 Family history of malignant neoplasm of trachea, bronchus and lung: Secondary | ICD-10-CM | POA: Insufficient documentation

## 2022-12-12 DIAGNOSIS — Z87891 Personal history of nicotine dependence: Secondary | ICD-10-CM | POA: Insufficient documentation

## 2022-12-12 DIAGNOSIS — Z862 Personal history of diseases of the blood and blood-forming organs and certain disorders involving the immune mechanism: Secondary | ICD-10-CM

## 2022-12-12 DIAGNOSIS — D696 Thrombocytopenia, unspecified: Secondary | ICD-10-CM | POA: Diagnosis not present

## 2022-12-12 DIAGNOSIS — D509 Iron deficiency anemia, unspecified: Secondary | ICD-10-CM | POA: Diagnosis not present

## 2022-12-13 NOTE — Progress Notes (Signed)
Hematology/Oncology Consult note [facility]  [phone] [phone]  Patient Care Team: Doreene Nest, NP as PCP - General (Nurse Practitioner) Marykay Lex, MD as PCP - Cardiology (Cardiology) Sallye Lat, MD as Consulting Physician (Ophthalmology) Elinor Parkinson, [place] as Consulting Physician (Podiatry) Teryl Lucy, MD as Consulting Physician (Orthopedic Surgery) Andrena Mews, DO as Consulting Physician (Sports Medicine) Bradly Bienenstock, MD as Consulting Physician (Orthopedic Surgery) [name], Latina Craver, MD (Vascular Surgery) Creig Hines, MD as Consulting Physician (Oncology)   Name of the patient: Alan [name]  [id number]  Dec 02, 1945   Date of visit: 12/13/22  Diagnosis- 1. Thrombocytopenia 2. Iron deficiency anemia  Chief complaint/ Reason for visit- discuss results of blood work  Heme/Onc history: Patient is a 77 year old male with a past medical history significant for coronary artery disease, carotid stenosis COPD among other medical problems who has been referred for thrombocytopenia patient has had some longstanding thrombocytopenia with a platelet count that has fluctuated between 1 30-1 40 at least dating back to 2022. White count is normal and his hemoglobin over the last 1 year has been between 10-11.   Results of blood work from 11/26/2022 were as follows: CBC showed white count of 5.6, H&H of 11.8/36.9 and a platelet count of 130.  B12 levels normal at 509.  H. pylori stool antigen negative.  Folate normal.  HIV and hepatitis C testing negative.  Ferritin levels low at 11 with an iron saturation of 11%.  Interval history-patient feels well overall.  He has been donating blood about 2 times a year.  Denies any blood loss in his stool or urine  ECOG PS- 1 Pain scale- 0   Review of systems- Review of Systems  Constitutional:  Negative for chills, fever, malaise/fatigue and weight loss.  HENT:   Negative for congestion, ear discharge and nosebleeds.   Eyes:  Negative for blurred vision.  Respiratory:  Negative for cough, hemoptysis, sputum production, shortness of breath and wheezing.   Cardiovascular:  Negative for chest pain, palpitations, orthopnea and claudication.  Gastrointestinal:  Negative for abdominal pain, blood in stool, constipation, diarrhea, heartburn, melena, nausea and vomiting.  Genitourinary:  Negative for dysuria, flank pain, frequency, hematuria and urgency.  Musculoskeletal:  Negative for back pain, joint pain and myalgias.  Skin:  Negative for rash.  Neurological:  Negative for dizziness, tingling, focal weakness, seizures, weakness and headaches.  Endo/Heme/Allergies:  Does not bruise/bleed easily.  Psychiatric/Behavioral:  Negative for depression and suicidal ideas. The patient does not have insomnia.       No Known Allergies   Past Medical History:  Diagnosis Date   Allergic rhinitis, cause unspecified    Allergy    Anxiety    Cancer (HCC)    Basal cell skin cancer,left ear   Carotid artery stenosis    left   Carotid stenosis, symptomatic w/o infarct, left 04/11/2022   Carpal tunnel syndrome    Chronic airway obstruction, not elsewhere classified    Chronic cough 05/25/2016   COPD with acute exacerbation (HCC) 10/02/2007   Qualifier: Diagnosis of  By: Maple Hudson MD, [place] D    Coronary artery disease    Depression    Dizziness 12/02/2020   Dyspnea    Esophageal reflux    Glaucoma (increased eye pressure)    History of kidney stones    Lipoma of unspecified site    Osteoarthrosis, unspecified whether generalized or localized, lower leg    Other and unspecified  hyperlipidemia    Paresthesia of left leg 11/17/2019   Peripheral vascular disease (HCC)    Personal history of colonic polyps    Personal history of other malignant neoplasm of skin    PONV (postoperative nausea and vomiting)    Stroke [facility])    " mini stroke"   Unspecified  essential hypertension    Viral gastroenteritis 09/20/2021     Past Surgical History:  Procedure Laterality Date   BACK SURGERY     lumbar disc surgery   bone spur  06/2003   right thumb   CAROTID ANGIOGRAPHY Left 01/23/2022   Procedure: CAROTID ANGIOGRAPHY;  Surgeon: Renford Dills, MD;  Location: [facility] INVASIVE CV LAB;  Service: Cardiovascular;  Laterality: Left;   CATARACT EXTRACTION     COLONOSCOPY W/ POLYPECTOMY     COLONOSCOPY WITH ESOPHAGOGASTRODUODENOSCOPY (EGD)     ENDARTERECTOMY Left 04/11/2022   Procedure: ENDARTERECTOMY CAROTID;  Surgeon: Renford Dills, MD;  Location: [facility] ORS;  Service: Vascular;  Laterality: Left;   KNEE ARTHROSCOPY  09/2007   partial medial mesicecotmy, patellar chonfroplasty   SHOULDER SURGERY  03/24/2007   removal bone spur   SHOULDER SURGERY Left 07/2016   [facility], Dr. 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  Tobacco Use   Smoking status: Former    Current packs/day: 0.00    Average packs/day: 3.0 packs/day for 25.0 years (75.0 ttl pk-yrs)    Types: Cigarettes    Start date: 11/24/1971    Quit date: 11/23/1996    Years since quitting: 26.0   Smokeless tobacco: Never  Vaping Use   Vaping status: Never Used  Substance and Sexual Activity   Alcohol use: Not Currently    Alcohol/week: 0.0 standard drinks of alcohol   Drug use: No   Sexual activity: Yes    Partners: Female    Comment: monogamous relationship  Other Topics Concern   Not on file  Social History Narrative   Regular exercise -- no            Social Determinants of Health   Financial Resource Strain: Low Risk   (11/08/2022)   Overall Financial Resource Strain (CARDIA)    Difficulty of Paying Living Expenses: Not hard at all  Food Insecurity: No Food Insecurity (11/26/2022)   Hunger Vital Sign    Worried About Running Out of Food in the Last Year: Never true    Ran Out of Food in the Last Year: Never true  Transportation Needs: No Transportation Needs (11/26/2022)   PRAPARE - Administrator, Civil Service (Medical): No    Lack of Transportation (Non-Medical): No  Physical Activity: Insufficiently Active (11/08/2022)   Exercise Vital Sign    Days of Exercise per Week: 1 day    Minutes of Exercise per Session: 10 min  Stress: No Stress Concern Present (11/08/2022)   [employer] of Occupational Health - Occupational Stress Questionnaire    Feeling of Stress : Only a little  Social Connections: Moderately Integrated (11/08/2022)   Social Connection and Isolation Panel [NHANES]    Frequency of Communication  with Friends and Family: More than three times a week    Frequency of Social Gatherings with Friends and Family: More than three times a week    Attends Religious Services: More than 4 times per year    Active Member of [employer] or Organizations: Yes    Attends Engineer, structural: More than 4 times per year    Marital Status: Divorced  Intimate Partner Violence: Not At Risk (11/26/2022)   Humiliation, Afraid, Rape, and Kick questionnaire    Fear of Current or Ex-Partner: No    Emotionally Abused: No    Physically Abused: No    Sexually Abused: No    Family History  Problem Relation Age of Onset   Diabetes Mother    Hyperlipidemia Mother    Hypertension Mother    Heart attack Mother    Obesity Mother    Hyperlipidemia Father    Hypertension Father    Lung cancer Father    Colon polyps Father    Hypertension Sister    Hyperlipidemia Sister    Lung cancer Sister    Hyperlipidemia Sister    Heart attack Brother    Diabetes Brother    Hyperlipidemia Brother     Hypertension Brother    Hyperlipidemia Brother    Hypertension Brother    Drug abuse Daughter    Colon cancer Neg Hx    Esophageal cancer Neg Hx    Pancreatic cancer Neg Hx    Rectal cancer Neg Hx      Current Outpatient Medications:    acetaminophen (TYLENOL) 500 MG tablet, Take 1,000 mg by mouth every 6 (six) hours as needed., Disp: , Rfl:    amLODipine (NORVASC) 10 MG tablet, TAKE 1 TABLET BY MOUTH EVERY DAY FOR BLOOD PRESSURE, Disp: 90 tablet, Rfl: 3   aspirin EC 81 MG tablet, Take 1 tablet (81 mg total) by mouth daily at 6 (six) AM. Swallow whole., Disp: 30 tablet, Rfl: 11   atorvastatin (LIPITOR) 80 MG tablet, TAKE 1 TABLET BY MOUTH EVERY DAY FOR CHOLESTEROL, Disp: 90 tablet, Rfl: 0   cyclobenzaprine (FLEXERIL) 5 MG tablet, Take 1 tablet (5 mg total) by mouth 3 (three) times daily as needed for muscle spasms., Disp: 15 tablet, Rfl: 0   fish oil-omega-3 fatty acids 1000 MG capsule, Take 2 g by mouth daily., Disp: , Rfl:    fluticasone (FLONASE) 50 MCG/ACT nasal spray, PLACE 1 SPRAY INTO BOTH NOSTRILS 2 (TWO) TIMES DAILY, Disp: 48 mL, Rfl: 0   losartan (COZAAR) 100 MG tablet, TAKE 1 TABLET BY MOUTH EVERY DAY FOR BLOOD PRESSURE, Disp: 90 tablet, Rfl: 2   montelukast (SINGULAIR) 10 MG tablet, TAKE 1 TABLET (10 MG TOTAL) BY MOUTH AT BEDTIME FOR ALLERGIES, Disp: 90 tablet, Rfl: 0   Multiple Vitamin (MULTIVITAMIN) tablet, Take 1 tablet by mouth daily., Disp: , Rfl:    pantoprazole (PROTONIX) 40 MG tablet, TAKE 1 TABLET BY MOUTH EVERY DAY FOR HEARTBURN, Disp: 90 tablet, Rfl: 0   sertraline (ZOLOFT) 100 MG tablet, TAKE 1 TABLET (100 MG TOTAL) BY MOUTH DAILY. FOR ANXIETY AND DEPRESSION., Disp: 90 tablet, Rfl: 0   ketoconazole (NIZORAL) 2 % shampoo, as needed. (Patient not taking: Reported on 12/12/2022), Disp: , Rfl:   Physical exam:  Vitals:   12/12/22 1442  BP: 123/73  Pulse: 60  Resp: 18  Temp: (!) 97.2 F (36.2 C)  TempSrc: Tympanic  SpO2: 99%  Weight: 252 lb (114.3 kg)    Physical Exam Cardiovascular:  Rate and Rhythm: Normal rate and regular rhythm.     Heart sounds: Normal heart sounds.  Pulmonary:     Effort: Pulmonary effort is normal.  Skin:    General: Skin is warm and dry.  Neurological:     Mental Status: He is alert and oriented to person, place, and time.         Latest Ref Rng & Units 11/09/2022    8:56 AM  CMP  Glucose 70 - 99 mg/dL 161   BUN 6 - 23 mg/dL 17   Creatinine 0.96 - 1.50 mg/dL 0.45   Sodium 409 - 811 mEq/L 139   Potassium 3.5 - 5.1 mEq/L 4.2   Chloride 96 - 112 mEq/L 101   CO2 19 - 32 mEq/L 32   Calcium 8.4 - 10.5 mg/dL 9.4   Total Protein 6.0 - 8.3 g/dL 6.4   Total Bilirubin 0.2 - 1.2 mg/dL 0.4   Alkaline Phos 39 - 117 U/L 58   AST 0 - 37 U/L 20   ALT 0 - 53 U/L 33       Latest Ref Rng & Units 11/26/2022    2:43 PM  CBC  WBC 4.0 - 10.5 K/uL 5.6   Hemoglobin 13.0 - 17.0 g/dL 91.4   Hematocrit 78.2 - 52.0 % 36.9   Platelets 150 - 400 K/uL 130     No images are attached to the encounter.  No results found.   Assessment and plan- Patient is a 77 y.o. 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  B12 and folate levels are normal stool H. pylori antigen negative.  HIV and hepatitis C testing negative.  This can be monitored conservatively  Patient has evidence of mild iron deficiency anemia have asked him to take oral iron at this time.  CBC ferritin and iron studies in 3 and 6 months and I will see him back in 6 months   Visit Diagnosis 1. History of iron deficiency anemia      Dr. Owens Shark, MD, MPH [facility] [phone] 12/13/2022 11:55 AM

## 2022-12-15 ENCOUNTER — Other Ambulatory Visit: Payer: Self-pay | Admitting: Primary Care

## 2022-12-15 DIAGNOSIS — F411 Generalized anxiety disorder: Secondary | ICD-10-CM

## 2022-12-15 DIAGNOSIS — K219 Gastro-esophageal reflux disease without esophagitis: Secondary | ICD-10-CM

## 2022-12-15 DIAGNOSIS — F3342 Major depressive disorder, recurrent, in full remission: Secondary | ICD-10-CM

## 2022-12-27 NOTE — Progress Notes (Signed)
Aleen Sells D.[facility] [address] [place] [address] Phone: [phone]   Assessment and Plan:     1. OA (osteoarthritis) of finger, right - Chronic with exacerbation, initial sports medicine visit - Consistent with flare of osteoarthritis of right second MCP based on physical exam and HPI - Patient described intermittent episodes of swelling and pain at second MCP that could be consistent with gout flare versus osteoarthritis.  No imaging on file.  Could consider testing uric acid level or aspirating joint to rule out gout versus treatment for gout to see if this decreases flares.  Patient said that he has been dealing with this pain for years and instead of workup would prefer an injection today and referral to orthopedic surgery.  Based on patient's wishes, MCP CSI performed today's visit, and referral to orthopedic hand surgery made today - CSI may temporarily increase blood glucose in patient with past medical history of prediabetes - May continue to use Voltaren gel and Tylenol 500 to 1000 mg 2-3 times a day for day-to-day pain relief  Procedure: Second [facility] joint Injection Side: Right Diagnosis: Flare of arthritis   After explaining the procedure, viable alternatives, risks, and answering any questions, consent was given verbally. The site was cleaned with alcohol prep.     A steroid injection was performed   using 0.76ml of 1% lidocaine without epinephrine and 20 mg of Kenalog 40. This was well tolerated and resulted in  relief.  Needle was removed and dressing placed and post injection instructions were given including  a discussion of likely return of pain today after the anesthetic wears off (with the possibility of worsened pain) until the steroid starts to work in 1-3 days.   Pt was advised to call or return to clinic if these symptoms worsen or fail to improve as anticipated.    Pertinent previous records reviewed include family  medicine note 08/01/2022   Follow Up: As needed   Subjective:   I, Moenique Parris, am serving as a Neurosurgeon for Doctor Richardean Sale  Chief Complaint: 2nd digit left hand   HPI:   12/28/2022 Patient is a 77 year old male  that he wants an injection for his arthritis . Pain for about a month. He has been using voltaren and tylenol but that's not cutting it anymore. He would like an injection   Relevant Historical Information: Hypertension  Additional pertinent review of systems negative.   Current Outpatient Medications:    acetaminophen (TYLENOL) 500 MG tablet, Take 1,000 mg by mouth every 6 (six) hours as needed., Disp: , Rfl:    amLODipine (NORVASC) 10 MG tablet, TAKE 1 TABLET BY MOUTH EVERY DAY FOR BLOOD PRESSURE, Disp: 90 tablet, Rfl: 3   aspirin EC 81 MG tablet, Take 1 tablet (81 mg total) by mouth daily at 6 (six) AM. Swallow whole., Disp: 30 tablet, Rfl: 11   atorvastatin (LIPITOR) 80 MG tablet, TAKE 1 TABLET BY MOUTH EVERY DAY FOR CHOLESTEROL, Disp: 90 tablet, Rfl: 0   cyclobenzaprine (FLEXERIL) 5 MG tablet, Take 1 tablet (5 mg total) by mouth 3 (three) times daily as needed for muscle spasms., Disp: 15 tablet, Rfl: 0   fish oil-omega-3 fatty acids 1000 MG capsule, Take 2 g by mouth daily., Disp: , Rfl:    fluticasone (FLONASE) 50 MCG/ACT nasal spray, PLACE 1 SPRAY INTO BOTH NOSTRILS 2 (TWO) TIMES DAILY, Disp: 48 mL, Rfl: 0   ketoconazole (NIZORAL) 2 % shampoo, as  needed., Disp: , Rfl:    losartan (COZAAR) 100 MG tablet, TAKE 1 TABLET BY MOUTH EVERY DAY FOR BLOOD PRESSURE, Disp: 90 tablet, Rfl: 2   montelukast (SINGULAIR) 10 MG tablet, TAKE 1 TABLET (10 MG TOTAL) BY MOUTH AT BEDTIME FOR ALLERGIES, Disp: 90 tablet, Rfl: 0   Multiple Vitamin (MULTIVITAMIN) tablet, Take 1 tablet by mouth daily., Disp: , Rfl:    pantoprazole (PROTONIX) 40 MG tablet, TAKE 1 TABLET BY MOUTH EVERY DAY FOR HEARTBURN, Disp: 90 tablet, Rfl: 2   sertraline (ZOLOFT) 100 MG tablet, TAKE 1 TABLET (100 MG  TOTAL) BY MOUTH DAILY. FOR ANXIETY AND DEPRESSION., Disp: 90 tablet, Rfl: 2   Objective:     Vitals:   12/28/22 0752  BP: 132/78  Pulse: 87  SpO2: 96%  Weight: 253 lb (114.8 kg)  Height: 5\' 9"  (1.753 m)      Body mass index is 37.36 kg/m.    Physical Exam:    General: Appears well, nad, nontoxic and pleasant Neuro:sensation intact, strength is 5/5 in upper extremities, muscle tone wnl Skin:no susupicious lesions or rashes  Right hand/wrist:   Enlarged bony second MCP joint bilaterally, but more prominent on right compared to left.  Mildly TTP without open lesion, erythema, warmth Wrist ROM  Ext 90, flexion70, radial/ulnar deviation 30 nttp over the snuff box, dorsal carpals, volar carpals, radial styloid, ulnar styloid, 1st mcp, tfcc  No pain with resisted ext, flex or deviation    Electronically signed by:  Aleen Sells D.[facility] 8:12 AM 12/28/22

## 2022-12-28 ENCOUNTER — Ambulatory Visit: Payer: Medicare HMO | Admitting: Sports Medicine

## 2022-12-28 VITALS — BP 132/78 | HR 87 | Ht 69.0 in | Wt 253.0 lb

## 2022-12-28 DIAGNOSIS — M19041 Primary osteoarthritis, right hand: Secondary | ICD-10-CM

## 2023-01-03 DIAGNOSIS — M79645 Pain in left finger(s): Secondary | ICD-10-CM | POA: Diagnosis not present

## 2023-01-30 ENCOUNTER — Other Ambulatory Visit: Payer: Self-pay | Admitting: Primary Care

## 2023-01-30 DIAGNOSIS — E782 Mixed hyperlipidemia: Secondary | ICD-10-CM

## 2023-02-01 ENCOUNTER — Other Ambulatory Visit: Payer: Self-pay | Admitting: Primary Care

## 2023-02-01 DIAGNOSIS — R972 Elevated prostate specific antigen [PSA]: Secondary | ICD-10-CM

## 2023-02-13 ENCOUNTER — Other Ambulatory Visit: Payer: Self-pay | Admitting: Primary Care

## 2023-02-13 ENCOUNTER — Other Ambulatory Visit (INDEPENDENT_AMBULATORY_CARE_PROVIDER_SITE_OTHER): Payer: Medicare HMO

## 2023-02-13 DIAGNOSIS — R972 Elevated prostate specific antigen [PSA]: Secondary | ICD-10-CM

## 2023-02-13 LAB — PSA, MEDICARE: PSA: 4.38 ng/mL — ABNORMAL HIGH (ref 0.10–4.00)

## 2023-02-14 ENCOUNTER — Other Ambulatory Visit: Payer: Self-pay | Admitting: Primary Care

## 2023-02-14 DIAGNOSIS — R972 Elevated prostate specific antigen [PSA]: Secondary | ICD-10-CM

## 2023-02-14 DIAGNOSIS — M19042 Primary osteoarthritis, left hand: Secondary | ICD-10-CM | POA: Diagnosis not present

## 2023-03-11 ENCOUNTER — Ambulatory Visit: Payer: Medicare HMO | Admitting: Urology

## 2023-03-11 ENCOUNTER — Encounter: Payer: Self-pay | Admitting: Urology

## 2023-03-11 VITALS — BP 151/78 | HR 74 | Ht 72.0 in | Wt 242.0 lb

## 2023-03-11 DIAGNOSIS — H34232 Retinal artery branch occlusion, left eye: Secondary | ICD-10-CM | POA: Diagnosis not present

## 2023-03-11 DIAGNOSIS — H34212 Partial retinal artery occlusion, left eye: Secondary | ICD-10-CM | POA: Diagnosis not present

## 2023-03-11 DIAGNOSIS — R972 Elevated prostate specific antigen [PSA]: Secondary | ICD-10-CM | POA: Diagnosis not present

## 2023-03-11 DIAGNOSIS — H4311 Vitreous hemorrhage, right eye: Secondary | ICD-10-CM | POA: Diagnosis not present

## 2023-03-11 DIAGNOSIS — H43813 Vitreous degeneration, bilateral: Secondary | ICD-10-CM | POA: Diagnosis not present

## 2023-03-11 DIAGNOSIS — H40013 Open angle with borderline findings, low risk, bilateral: Secondary | ICD-10-CM | POA: Diagnosis not present

## 2023-03-11 NOTE — Progress Notes (Signed)
I, Maysun Anabel Bene, acting as a scribe for Riki Altes, MD., have documented all relevant documentation on the behalf of Riki Altes, MD, as directed by Riki Altes, MD while in the presence of Riki Altes, MD.  03/11/2023 3:56 PM   Alan [name] 07/01/1945 [id number]  Referring provider: Doreene Nest, NP [address] [place],  [place] [address]  Chief Complaint  Patient presents with   Elevated PSA    HPI: Alan [name] is a 77 y.o. male referred for evaluation of an elevated PSA.  PSA 2 years ago was 1.78 and last year 2.38 PSA August 2024 bump to 4.47, which was repeated 02/13/23 and remained persistently elevated at 4.38 No bothersome voiding symptoms. He has occasional urinary hesitancy and post-void dribbling.  Denies recurrent UTI or dysuria.  No flank, abdominal, or pelvic pain.  No family history of prostate cancer.   PSA trend   PSA  Latest Ref Rng 0.10 - 4.00 ng/ml  10/21/2020 1.78   11/07/2021 2.38   11/09/2022 4.47 (H)   02/13/2023 4.38 (H)      PMH: Past Medical History:  Diagnosis Date   Allergic rhinitis, cause unspecified    Allergy    Anxiety    Cancer (HCC)    Basal cell skin cancer,left ear   Carotid artery stenosis    left   Carotid stenosis, symptomatic w/o infarct, left 04/11/2022   Carpal tunnel syndrome    Chronic airway obstruction, not elsewhere classified    Chronic cough 05/25/2016   COPD with acute exacerbation (HCC) 10/02/2007   Qualifier: Diagnosis of  By: Maple Hudson MD, [place] D    Coronary artery disease    Depression    Dizziness 12/02/2020   Dyspnea    Esophageal reflux    Glaucoma (increased eye pressure)    History of kidney stones    Lipoma of unspecified site    Osteoarthrosis, unspecified whether generalized or localized, lower leg    Other and unspecified hyperlipidemia    Paresthesia of left leg 11/17/2019   Peripheral vascular disease (HCC)    Personal history of colonic polyps     Personal history of other malignant neoplasm of skin    PONV (postoperative nausea and vomiting)    Stroke (HCC)    " mini stroke"   Unspecified essential hypertension    Viral gastroenteritis 09/20/2021    Surgical History: Past Surgical History:  Procedure Laterality Date   BACK SURGERY     lumbar disc surgery   bone spur  06/2003   right thumb   CAROTID ANGIOGRAPHY Left 01/23/2022   Procedure: CAROTID ANGIOGRAPHY;  Surgeon: Renford Dills, MD;  Location: [facility] INVASIVE CV LAB;  Service: Cardiovascular;  Laterality: Left;   CATARACT EXTRACTION     COLONOSCOPY W/ POLYPECTOMY     COLONOSCOPY WITH ESOPHAGOGASTRODUODENOSCOPY (EGD)     ENDARTERECTOMY Left 04/11/2022   Procedure: ENDARTERECTOMY CAROTID;  Surgeon: Renford Dills, MD;  Location: [facility] ORS;  Service: Vascular;  Laterality: Left;   KNEE ARTHROSCOPY  09/2007   partial medial mesicecotmy, patellar chonfroplasty   SHOULDER SURGERY  03/24/2007   removal bone spur   SHOULDER SURGERY Left 07/2016   [facility], Dr. Olegario Shearer   TOTAL KNEE ARTHROPLASTY     03-2009 [facility]   TOTAL KNEE ARTHROPLASTY Left 12/2012   [facility]    Home Medications:  Allergies as of 03/11/2023   No Known Allergies  Medication List        Accurate as of March 11, 2023  3:56 PM. If you have any questions, ask your nurse or doctor.          STOP taking these medications    fluticasone 50 MCG/ACT nasal spray Commonly known as: FLONASE Stopped by: Riki Altes       TAKE these medications    acetaminophen 500 MG tablet Commonly known as: TYLENOL Take 1,000 mg by mouth every 6 (six) hours as needed.   amLODipine 10 MG tablet Commonly known as: NORVASC TAKE 1 TABLET BY MOUTH EVERY DAY FOR BLOOD PRESSURE   aspirin EC 81 MG tablet Take 1 tablet (81 mg total) by mouth daily at 6 (six) AM. Swallow whole.   atorvastatin 80 MG tablet Commonly known as: LIPITOR TAKE 1 TABLET BY MOUTH EVERY DAY FOR  CHOLESTEROL   cyclobenzaprine 5 MG tablet Commonly known as: FLEXERIL Take 1 tablet (5 mg total) by mouth 3 (three) times daily as needed for muscle spasms.   fish oil-omega-3 fatty acids 1000 MG capsule Take 2 g by mouth daily.   ketoconazole 2 % shampoo Commonly known as: NIZORAL as needed.   losartan 100 MG tablet Commonly known as: COZAAR TAKE 1 TABLET BY MOUTH EVERY DAY FOR BLOOD PRESSURE   montelukast 10 MG tablet Commonly known as: SINGULAIR TAKE 1 TABLET (10 MG TOTAL) BY MOUTH AT BEDTIME FOR ALLERGIES   multivitamin tablet Take 1 tablet by mouth daily.   pantoprazole 40 MG tablet Commonly known as: PROTONIX TAKE 1 TABLET BY MOUTH EVERY DAY FOR HEARTBURN   sertraline 100 MG tablet Commonly known as: ZOLOFT TAKE 1 TABLET (100 MG TOTAL) BY MOUTH DAILY. FOR ANXIETY AND DEPRESSION.        Allergies: No Known Allergies  Family History: Family History  Problem Relation Age of Onset   Diabetes Mother    Hyperlipidemia Mother    Hypertension Mother    Heart attack Mother    Obesity Mother    Hyperlipidemia Father    Hypertension Father    Lung cancer Father    Colon polyps Father    Hypertension Sister    Hyperlipidemia Sister    Lung cancer Sister    Hyperlipidemia Sister    Heart attack Brother    Diabetes Brother    Hyperlipidemia Brother    Hypertension Brother    Hyperlipidemia Brother    Hypertension Brother    Drug abuse Daughter    Colon cancer Neg Hx    Esophageal cancer Neg Hx    Pancreatic cancer Neg Hx    Rectal cancer Neg Hx     Social History:  reports that he quit smoking about 26 years ago. His smoking use included cigarettes. He started smoking about 51 years ago. He has a 75 pack-year smoking history. He has never used smokeless tobacco. He reports that he does not currently use alcohol. He reports that he does not use drugs.   Physical Exam: BP (!) 151/78   Pulse 74   Ht 6' (1.829 m)   Wt 242 lb (109.8 kg)   BMI 32.82 kg/m    Constitutional:  Alert and oriented, No acute distress. HEENT: [place] AT, moist mucus membranes.  Trachea midline, no masses. Cardiovascular: No clubbing, cyanosis, or edema. Respiratory: Normal respiratory effort, no increased work of breathing. GI: Abdomen is soft, nontender, nondistended, no abdominal masses GU: Prostate small 20 grams, smooth without nodules.  Skin: No rashes, bruises  or suspicious lesions. Neurologic: Grossly intact, no focal deficits, moving all 4 extremities. Psychiatric: Normal mood and affect.   Assessment & Plan:    1. Elevated PSA Benign DRE Although PSA is a prostate cancer screening test he was informed that cancer is not the most common cause of an elevated PSA. Other potential causes including BPH and inflammation were discussed.  He was informed that the only way to adequately diagnose prostate cancer would be transrectal ultrasound and biopsy of the prostate. The procedure was discussed including potential risks of bleeding and infection/sepsis. He was also informed that a negative biopsy does not conclusively rule out the possibility that prostate cancer may be present and that continued monitoring is required.  The use of newer adjunctive blood and urine tests were discussed.  The use of multiparametric prostate MRI to evaluate for lesions suspicious for high grade prostate cancer and aid in targeted bx was reviewed.  Continued periodic surveillance was also discussed.  Further evaluation with prostate MRI and he will be notified with the results  [facility] [address], [address] [place], [place] [address] [phone]

## 2023-03-11 NOTE — Patient Instructions (Signed)
Prostate MRI Prep:  1- No ejaculation 48 hours prior to exam  2- No caffeine or carbonated beverages on day of the exam  3- Eat light diet evening prior and day of exam  4- Avoid eating 4 hours prior to exam  5- Fleets enema needs to be done 4 hours prior to exam -See below. Can be purchased at the drug store.

## 2023-03-13 ENCOUNTER — Inpatient Hospital Stay: Payer: Medicare HMO | Attending: Oncology

## 2023-03-13 DIAGNOSIS — D696 Thrombocytopenia, unspecified: Secondary | ICD-10-CM | POA: Diagnosis not present

## 2023-03-13 DIAGNOSIS — Z862 Personal history of diseases of the blood and blood-forming organs and certain disorders involving the immune mechanism: Secondary | ICD-10-CM

## 2023-03-13 DIAGNOSIS — D509 Iron deficiency anemia, unspecified: Secondary | ICD-10-CM | POA: Insufficient documentation

## 2023-03-13 LAB — CBC
HCT: 39.9 % (ref 39.0–52.0)
Hemoglobin: 13.3 g/dL (ref 13.0–17.0)
MCH: 29.6 pg (ref 26.0–34.0)
MCHC: 33.3 g/dL (ref 30.0–36.0)
MCV: 88.7 fL (ref 80.0–100.0)
Platelets: 141 10*3/uL — ABNORMAL LOW (ref 150–400)
RBC: 4.5 MIL/uL (ref 4.22–5.81)
RDW: 12.8 % (ref 11.5–15.5)
WBC: 5.4 10*3/uL (ref 4.0–10.5)
nRBC: 0 % (ref 0.0–0.2)

## 2023-03-13 LAB — IRON AND TIBC
Iron: 96 ug/dL (ref 45–182)
Saturation Ratios: 27 % (ref 17.9–39.5)
TIBC: 350 ug/dL (ref 250–450)
UIBC: 254 ug/dL

## 2023-03-13 LAB — FERRITIN: Ferritin: 21 ng/mL — ABNORMAL LOW (ref 24–336)

## 2023-03-20 DIAGNOSIS — M19042 Primary osteoarthritis, left hand: Secondary | ICD-10-CM | POA: Diagnosis not present

## 2023-04-04 DIAGNOSIS — M79642 Pain in left hand: Secondary | ICD-10-CM | POA: Diagnosis not present

## 2023-04-04 DIAGNOSIS — M19042 Primary osteoarthritis, left hand: Secondary | ICD-10-CM | POA: Diagnosis not present

## 2023-04-05 ENCOUNTER — Ambulatory Visit (HOSPITAL_COMMUNITY)
Admission: RE | Admit: 2023-04-05 | Discharge: 2023-04-05 | Disposition: A | Discharge status: Home / Self Care | Payer: Medicare HMO | Source: Ambulatory Visit | Attending: Urology | Admitting: Urology

## 2023-04-05 DIAGNOSIS — K5730 Diverticulosis of large intestine without perforation or abscess without bleeding: Secondary | ICD-10-CM | POA: Diagnosis not present

## 2023-04-05 DIAGNOSIS — R972 Elevated prostate specific antigen [PSA]: Secondary | ICD-10-CM | POA: Diagnosis not present

## 2023-04-05 DIAGNOSIS — N4289 Other specified disorders of prostate: Secondary | ICD-10-CM | POA: Diagnosis not present

## 2023-04-05 MED ORDER — GADOBUTROL 1 MMOL/ML IV SOLN
10.0000 mL | Freq: Once | INTRAVENOUS | Status: AC | PRN
  Administered 2023-04-05: 10 mL via INTRAVENOUS

## 2023-04-12 DIAGNOSIS — M79642 Pain in left hand: Secondary | ICD-10-CM | POA: Diagnosis not present

## 2023-04-25 DIAGNOSIS — M19042 Primary osteoarthritis, left hand: Secondary | ICD-10-CM | POA: Diagnosis not present

## 2023-04-25 DIAGNOSIS — M25641 Stiffness of right hand, not elsewhere classified: Secondary | ICD-10-CM | POA: Diagnosis not present

## 2023-05-02 DIAGNOSIS — M25641 Stiffness of right hand, not elsewhere classified: Secondary | ICD-10-CM | POA: Diagnosis not present

## 2023-05-07 DIAGNOSIS — M25641 Stiffness of right hand, not elsewhere classified: Secondary | ICD-10-CM | POA: Diagnosis not present

## 2023-05-09 DIAGNOSIS — L538 Other specified erythematous conditions: Secondary | ICD-10-CM | POA: Diagnosis not present

## 2023-05-09 DIAGNOSIS — L218 Other seborrheic dermatitis: Secondary | ICD-10-CM | POA: Diagnosis not present

## 2023-05-09 DIAGNOSIS — L820 Inflamed seborrheic keratosis: Secondary | ICD-10-CM | POA: Diagnosis not present

## 2023-05-09 DIAGNOSIS — L821 Other seborrheic keratosis: Secondary | ICD-10-CM | POA: Diagnosis not present

## 2023-05-09 DIAGNOSIS — L814 Other melanin hyperpigmentation: Secondary | ICD-10-CM | POA: Diagnosis not present

## 2023-05-09 DIAGNOSIS — D225 Melanocytic nevi of trunk: Secondary | ICD-10-CM | POA: Diagnosis not present

## 2023-05-09 DIAGNOSIS — L720 Epidermal cyst: Secondary | ICD-10-CM | POA: Diagnosis not present

## 2023-05-16 ENCOUNTER — Other Ambulatory Visit: Payer: Medicare HMO

## 2023-05-16 DIAGNOSIS — M25641 Stiffness of right hand, not elsewhere classified: Secondary | ICD-10-CM | POA: Diagnosis not present

## 2023-05-16 DIAGNOSIS — M79642 Pain in left hand: Secondary | ICD-10-CM | POA: Diagnosis not present

## 2023-05-16 DIAGNOSIS — M19042 Primary osteoarthritis, left hand: Secondary | ICD-10-CM | POA: Diagnosis not present

## 2023-05-17 ENCOUNTER — Other Ambulatory Visit: Payer: Self-pay

## 2023-05-17 ENCOUNTER — Telehealth: Payer: Self-pay

## 2023-05-17 ENCOUNTER — Other Ambulatory Visit: Payer: Medicare HMO

## 2023-05-17 DIAGNOSIS — D696 Thrombocytopenia, unspecified: Secondary | ICD-10-CM

## 2023-05-17 DIAGNOSIS — R972 Elevated prostate specific antigen [PSA]: Secondary | ICD-10-CM

## 2023-05-17 DIAGNOSIS — Z862 Personal history of diseases of the blood and blood-forming organs and certain disorders involving the immune mechanism: Secondary | ICD-10-CM

## 2023-05-17 LAB — PSA: PSA: 4.29 ng/mL — ABNORMAL HIGH (ref 0.10–4.00)

## 2023-05-17 NOTE — Telephone Encounter (Signed)
Pt calls triage line and states that he is returning call from our office. Gave pt biopsy instructions including d/c ASA x 7 days prior to biopsy. Pt voiced understanding.

## 2023-05-17 NOTE — Addendum Note (Signed)
Addended by: Alvina Chou on: 05/17/2023 10:18 AM   Modules accepted: Orders

## 2023-05-21 ENCOUNTER — Ambulatory Visit: Payer: Self-pay | Admitting: Primary Care

## 2023-05-21 NOTE — Telephone Encounter (Signed)
Copied from CRM [id number]. Topic: Clinical - Red Word Triage >> May 21, 2023  3:58 PM Irine Seal wrote: [facility] that prompted transfer to Nurse Triage: patient presents with intermittent back pain, ranging from mild to severe. Stated its in the waist line does not wrap around into the front localized in the back. Symptoms presented a week ago.   Chief Complaint: Back pain Pertinent Negatives: Patient denies urinary symptoms Disposition: [] ED /[] Urgent Care (no appt availability in office) / [x] Appointment(In office/virtual)/ []  [facility]/ [] Home Care/ [] Refused Recommended Disposition /[] [facility]/ []  Follow-up with PCP Additional Notes: Patient stated he is having back pain that started a week ago. The pain comes and goes. He also stated that he has been experiencing intermittent numbness and tingling in the hands but that has been going on for several months. Appointment scheduled for 2/20.   Reason for Disposition  [1] MODERATE back pain (e.g., interferes with normal activities) AND [2] present > 3 days  Answer Assessment - Initial Assessment Questions 1. ONSET: "When did the pain begin?"      1 week ago  2. LOCATION: "Where does it hurt?" (upper, mid or lower back)     Left lower back around the waistline  3. SEVERITY: "How bad is the pain?"  (e.g., Scale 1-10; mild, moderate, or severe)   - MILD (1-3): Doesn't interfere with normal activities.    - MODERATE (4-7): Interferes with normal activities or awakens from sleep.    - SEVERE (8-10): Excruciating pain, unable to do any normal activities.      6/10  4. PATTERN: "Is the pain constant?" (e.g., yes, no; constant, intermittent)      Comes and goes. Patient feels okay when resting in recliner  5. RADIATION: "Does the pain shoot into your legs or somewhere else?"     No  6. 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 BACK OVERUSE:  "Any recent lifting of heavy objects, strenuous work or exercise?"      8. MEDICINES: "What have you taken so far for the pain?" (e.g., nothing, acetaminophen, NSAIDS)     Tylenol takes the edge off but doesn't get rid of it completely.  9. NEUROLOGIC SYMPTOMS: "Do you have any weakness, numbness, or problems with bowel/bladder control?"     Intermittent and numbness and tingling in hands, has been going on long time 6-8 months   10. OTHER SYMPTOMS: "Do you have any other symptoms?" (e.g., fever, abdomen pain, burning with urination, blood in urine)       No  Protocols used: Back Pain-A-AH

## 2023-05-21 NOTE — Telephone Encounter (Signed)
Appt made for 05/23/23

## 2023-05-21 NOTE — Telephone Encounter (Signed)
 Noted, will evaluate.

## 2023-05-22 ENCOUNTER — Other Ambulatory Visit: Payer: Self-pay | Admitting: Urology

## 2023-05-23 ENCOUNTER — Ambulatory Visit: Payer: Medicare HMO | Admitting: Primary Care

## 2023-05-23 ENCOUNTER — Encounter: Payer: Self-pay | Admitting: Primary Care

## 2023-05-23 ENCOUNTER — Ambulatory Visit
Admission: RE | Admit: 2023-05-23 | Discharge: 2023-05-23 | Disposition: A | Discharge status: Home / Self Care | Payer: Medicare HMO | Source: Ambulatory Visit | Attending: Primary Care

## 2023-05-23 VITALS — BP 124/82 | HR 68 | Temp 98.0°F | Ht 72.0 in | Wt 244.8 lb

## 2023-05-23 DIAGNOSIS — R202 Paresthesia of skin: Secondary | ICD-10-CM | POA: Diagnosis not present

## 2023-05-23 DIAGNOSIS — M542 Cervicalgia: Secondary | ICD-10-CM

## 2023-05-23 DIAGNOSIS — M545 Low back pain, unspecified: Secondary | ICD-10-CM | POA: Diagnosis not present

## 2023-05-23 DIAGNOSIS — M4802 Spinal stenosis, cervical region: Secondary | ICD-10-CM | POA: Diagnosis not present

## 2023-05-23 DIAGNOSIS — G8929 Other chronic pain: Secondary | ICD-10-CM | POA: Diagnosis not present

## 2023-05-23 DIAGNOSIS — M5030 Other cervical disc degeneration, unspecified cervical region: Secondary | ICD-10-CM | POA: Diagnosis not present

## 2023-05-23 DIAGNOSIS — M48061 Spinal stenosis, lumbar region without neurogenic claudication: Secondary | ICD-10-CM | POA: Diagnosis not present

## 2023-05-23 DIAGNOSIS — M47816 Spondylosis without myelopathy or radiculopathy, lumbar region: Secondary | ICD-10-CM | POA: Diagnosis not present

## 2023-05-23 DIAGNOSIS — M51369 Other intervertebral disc degeneration, lumbar region without mention of lumbar back pain or lower extremity pain: Secondary | ICD-10-CM | POA: Diagnosis not present

## 2023-05-23 DIAGNOSIS — M549 Dorsalgia, unspecified: Secondary | ICD-10-CM | POA: Diagnosis not present

## 2023-05-23 DIAGNOSIS — M47812 Spondylosis without myelopathy or radiculopathy, cervical region: Secondary | ICD-10-CM | POA: Diagnosis not present

## 2023-05-23 MED ORDER — PREDNISONE 20 MG PO TABS
ORAL_TABLET | ORAL | 0 refills | Status: DC
Start: 2023-05-23 — End: 2023-06-12

## 2023-05-23 MED ORDER — CYCLOBENZAPRINE HCL 5 MG PO TABS: 5.0000 mg | ORAL_TABLET | Freq: Three times a day (TID) | ORAL | 0 refills | Status: AC | PRN

## 2023-05-23 NOTE — Assessment & Plan Note (Signed)
Acute on chronic flare. No alarm signs in HPI or exam today.  Will update lumbar plain films.  Orders placed.  Start cyclobenzaprine 5 mg 3 times daily as needed.  Drowsiness precautions provided.  Referral placed for orthopedics to evaluate for lumbar spine injections.

## 2023-05-23 NOTE — Progress Notes (Signed)
Subjective:    [name], male    DOB: 04/07/1945, 78 y.o.   MRN: [id number]  Back Pain Associated symptoms include numbness.    Alan [name] is a very pleasant 78 y.o. male with a history of hypertension, PAD, PVD, carpel tunnel syndrome, lumbar radiculopathy, lower extremity edema, chronic back pain, tremor, chronic neck pain, chronic shoulder pain, CVA who presents today to discuss back pan and hand paresthesias.   His back pain is located to the mid lumbar region with radiation to the right lumbar region. This began about 2 weeks ago, felt improved a few days ago, then pain has returned. His pain is worse with changing positions, rising from a seated position. Improved with sitting and laying. Previously following with Dr. Ethelene Hal, has undergone spinal injections with improvement.   His paresthesias are located to the bilateral hands, mostly fingers and palms which began 6-7 months ago, worse over last month or so. He will wake during the night with tingling. He also notices his tingling with driving or any fine motor movement. He continues to notice chronic right shoulder pain and chronic neck pain. He denies paresthesias radiating from the neck/shoulder down the upper extremities.  He denies lower extremity radiculopathy, numbness/tingling, changes in bowel/bladder control.  He underwent plain films of the cervical spine in 2021 which revealed cervical spondylosis.  He underwent CTA neck in 2022 which revealed degenerative changes to the cervical spine.  He underwent MRI lumbar spine in 2019 which revealed progression of lumbar spondylosis at the L3-4 and L4-5 levels, moderate L3-4 and L5 canal stenosis, L3-4 through L5-S1 mild foraminal stenosis.   Review of Systems  Musculoskeletal:  Positive for arthralgias, back pain and neck pain.  Skin:  Negative for color change.  Neurological:  Positive for numbness.         Past Medical History:  Diagnosis Date    Allergic rhinitis, cause unspecified    Allergy    Anxiety    Cancer (HCC)    Basal cell skin cancer,left ear   Carotid artery stenosis    left   Carotid stenosis, symptomatic w/o infarct, left 04/11/2022   Carpal tunnel syndrome    Chronic airway obstruction, not elsewhere classified    Chronic cough 05/25/2016   COPD with acute exacerbation (HCC) 10/02/2007   Qualifier: Diagnosis of  By: Maple Hudson MD, Clinton D    Coronary artery disease    Depression    Dizziness 12/02/2020   Dyspnea    Esophageal reflux    Glaucoma (increased eye pressure)    History of kidney stones    Lipoma of unspecified site    Osteoarthrosis, unspecified whether generalized or localized, lower leg    Other and unspecified hyperlipidemia    Paresthesia of left leg 11/17/2019   Peripheral vascular disease (HCC)    Personal history of colonic polyps    Personal history of other malignant neoplasm of skin    PONV (postoperative nausea and vomiting)    Stroke (HCC)    " mini stroke"   Unspecified essential hypertension    Viral gastroenteritis 09/20/2021    Social History   Socioeconomic History   Marital status: Divorced    Spouse name: Not on file   Number of children: 2   Years of education: Not on file   Highest education level: 12th grade  Occupational History   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  Tobacco Use   Smoking status: Former    Current packs/day:  0.00    Average packs/day: 3.0 packs/day for 25.0 years (75.0 ttl pk-yrs)    Types: Cigarettes    Start date: 11/24/1971    Quit date: 11/23/1996    Years since quitting: 26.5   Smokeless tobacco: Never  Vaping Use   Vaping status: Never Used  Substance and Sexual Activity   Alcohol use: Not Currently    Alcohol/week: 0.0 standard drinks of alcohol   Drug use: No   Sexual activity: Yes    Partners: Female    Comment: monogamous relationship  Other Topics Concern   Not on file  Social History Narrative   Regular exercise --  no            Social Drivers of Corporate investment banker Strain: Low Risk  (11/08/2022)   Overall Financial Resource Strain (CARDIA)    Difficulty of Paying Living Expenses: Not hard at all  Food Insecurity: No Food Insecurity (11/26/2022)   Hunger Vital Sign    Worried About Running Out of Food in the Last Year: Never true    Ran Out of Food in the Last Year: Never true  Transportation Needs: No Transportation Needs (11/26/2022)   PRAPARE - Administrator, Civil Service (Medical): No    Lack of Transportation (Non-Medical): No  Physical Activity: Insufficiently Active (11/08/2022)   Exercise Vital Sign    Days of Exercise per Week: 1 day    Minutes of Exercise per Session: 10 min  Stress: No Stress Concern Present (11/08/2022)   [employer] of Occupational Health - Occupational Stress Questionnaire    Feeling of Stress : Only a little  Social Connections: Moderately Integrated (11/08/2022)   Social Connection and Isolation Panel [NHANES]    Frequency of Communication with Friends and Family: More than three times a week    Frequency of Social Gatherings with Friends and Family: More than three times a week    Attends Religious Services: More than 4 times per year    Active Member of Clubs or Organizations: Yes    Attends Banker Meetings: More than 4 times per year    Marital Status: Divorced  Intimate Partner Violence: Not At Risk (11/26/2022)   Humiliation, Afraid, Rape, and Kick questionnaire    Fear of Current or Ex-Partner: No    Emotionally Abused: No    Physically Abused: No    Sexually Abused: No    Past Surgical History:  Procedure Laterality Date   BACK SURGERY     lumbar disc surgery   bone spur  06/2003   right thumb   CAROTID ANGIOGRAPHY Left 01/23/2022   Procedure: CAROTID ANGIOGRAPHY;  Surgeon: Renford Dills, MD;  Location: [facility];  Service: Cardiovascular;  Laterality: Left;   CATARACT EXTRACTION      COLONOSCOPY W/ POLYPECTOMY     COLONOSCOPY WITH ESOPHAGOGASTRODUODENOSCOPY (EGD)     ENDARTERECTOMY Left 04/11/2022   Procedure: ENDARTERECTOMY CAROTID;  Surgeon: Renford Dills, MD;  Location: [facility];  Service: Vascular;  Laterality: Left;   KNEE ARTHROSCOPY  09/2007   partial medial mesicecotmy, patellar chonfroplasty   SHOULDER SURGERY  03/24/2007   removal bone spur   SHOULDER SURGERY Left 07/2016   [facility] [place], Dr. Olegario Shearer   TOTAL KNEE ARTHROPLASTY     03-2009 [facility]   TOTAL KNEE ARTHROPLASTY Left 12/2012   [facility]    Family History  Problem Relation Age of Onset   Diabetes Mother  Hyperlipidemia Mother    Hypertension Mother    Heart attack Mother    Obesity Mother    Hyperlipidemia Father    Hypertension Father    Lung cancer Father    Colon polyps Father    Hypertension Sister    Hyperlipidemia Sister    Lung cancer Sister    Hyperlipidemia Sister    Heart attack Brother    Diabetes Brother    Hyperlipidemia Brother    Hypertension Brother    Hyperlipidemia Brother    Hypertension Brother    Drug abuse Daughter    Colon cancer Neg Hx    Esophageal cancer Neg Hx    Pancreatic cancer Neg Hx    Rectal cancer Neg Hx     No Known Allergies  Current Outpatient Medications on File Prior to Visit  Medication Sig Dispense Refill   acetaminophen (TYLENOL) 500 MG tablet Take 1,000 mg by mouth every 6 (six) hours as needed.     amLODipine (NORVASC) 10 MG tablet TAKE 1 TABLET BY MOUTH EVERY DAY FOR BLOOD PRESSURE 90 tablet 3   aspirin EC 81 MG tablet Take 1 tablet (81 mg total) by mouth daily at 6 (six) AM. Swallow whole. 30 tablet 11   atorvastatin (LIPITOR) 80 MG tablet TAKE 1 TABLET BY MOUTH EVERY DAY FOR CHOLESTEROL 90 tablet 2   fish oil-omega-3 fatty acids 1000 MG capsule Take 2 g by mouth daily.     ketoconazole (NIZORAL) 2 % shampoo as needed.     losartan (COZAAR) 100 MG tablet TAKE 1 TABLET BY MOUTH EVERY DAY FOR BLOOD PRESSURE 90  tablet 2   montelukast (SINGULAIR) 10 MG tablet TAKE 1 TABLET (10 MG TOTAL) BY MOUTH AT BEDTIME FOR ALLERGIES 90 tablet 0   Multiple Vitamin (MULTIVITAMIN) tablet Take 1 tablet by mouth daily.     pantoprazole (PROTONIX) 40 MG tablet TAKE 1 TABLET BY MOUTH EVERY DAY FOR HEARTBURN 90 tablet 2   sertraline (ZOLOFT) 100 MG tablet TAKE 1 TABLET (100 MG TOTAL) BY MOUTH DAILY. FOR ANXIETY AND DEPRESSION. 90 tablet 2   No current facility-administered medications on file prior to visit.    BP 124/82 (BP Location: Left Arm, Patient Position: Sitting, Cuff Size: Normal)   Pulse 68   Temp 98 F (36.7 C) (Oral)   Ht 6' (1.829 m)   Wt 244 lb 12.8 oz (111 kg)   SpO2 97%   BMI 33.20 kg/m  Objective:   Physical Exam Cardiovascular:     Rate and Rhythm: Normal rate.  Pulmonary:     Effort: Pulmonary effort is normal.  Musculoskeletal:     Lumbar back: No bony tenderness. Decreased range of motion. Negative right straight leg raise test and negative left straight leg raise test.       Back:     Comments: Decrease in range of motion to lumbar spine with flexion, extension, right and left rotation.  Neurological:     Mental Status: He is alert.     Comments: Negative Phalen and Tinel signs.           Assessment & Plan:  Chronic neck pain -     DG Cervical Spine 2 or 3 views  Chronic bilateral low back pain without sciatica Assessment & Plan: Acute on chronic flare. No alarm signs in HPI or exam today.  Will update lumbar plain films.  Orders placed.  Start cyclobenzaprine 5 mg 3 times daily as needed.  Drowsiness precautions provided.  Referral  placed for orthopedics to evaluate for lumbar spine injections.  Orders: -     Ambulatory referral to Orthopedic Surgery -     DG Lumbar Spine 2-3 Views -     Cyclobenzaprine HCl; Take 1 tablet (5 mg total) by mouth 3 (three) times daily as needed for muscle spasms.  Dispense: 15 tablet; Refill: 0  Paresthesias Assessment &  Plan: Symptoms representative of carpal tunnel syndrome.  There certainly could be some cervical spine involvement.  X-rays of the cervical spine ordered and pending.  Start prednisone tablets. Take two tablets my mouth once daily in the morning for four days, then one tablet once daily in the morning for four days.   Recommended sports medicine evaluation for carpal tunnel symptoms.  He will schedule.  Orders: -     DG Cervical Spine 2 or 3 views -     predniSONE; Take two tablets my mouth once daily in the morning for four days, then one tablet once daily in the morning for four days.  Dispense: 12 tablet; Refill: 0        Doreene Nest, NP

## 2023-05-23 NOTE — Assessment & Plan Note (Signed)
Symptoms representative of carpal tunnel syndrome.  There certainly could be some cervical spine involvement.  X-rays of the cervical spine ordered and pending.  Start prednisone tablets. Take two tablets my mouth once daily in the morning for four days, then one tablet once daily in the morning for four days.   Recommended sports medicine evaluation for carpal tunnel symptoms.  He will schedule.

## 2023-05-23 NOTE — Patient Instructions (Signed)
Complete xray(s) prior to leaving today. I will notify you of your results once received.  You may take the cyclobenzaprine muscle relaxer medication 3 times daily as needed for muscle spasms/back pain.  Be careful as this medication may cause drowsiness.  Start prednisone tablets. Take two tablets my mouth once daily in the morning for four days, then one tablet once daily in the morning for four days.   You will either be contacted via phone regarding your referral to Dr. Ethelene Hal, or you may receive a letter on your MyChart portal from our referral team with instructions for scheduling an appointment. Please let us know if you have not been contacted by anyone within two weeks.  Set up an appointment with Dr. Patsy Lager for the numbness to your hands.  It was a pleasure to see you today!

## 2023-06-05 ENCOUNTER — Inpatient Hospital Stay: Payer: Medicare HMO | Admitting: Oncology

## 2023-06-05 ENCOUNTER — Inpatient Hospital Stay: Payer: Medicare HMO | Attending: Oncology

## 2023-06-05 ENCOUNTER — Encounter: Payer: Self-pay | Admitting: Oncology

## 2023-06-05 ENCOUNTER — Telehealth: Payer: Self-pay | Admitting: *Deleted

## 2023-06-05 VITALS — BP 119/73 | HR 78 | Temp 97.4°F | Resp 14 | Wt 246.0 lb

## 2023-06-05 DIAGNOSIS — Z862 Personal history of diseases of the blood and blood-forming organs and certain disorders involving the immune mechanism: Secondary | ICD-10-CM | POA: Diagnosis not present

## 2023-06-05 DIAGNOSIS — Z7952 Long term (current) use of systemic steroids: Secondary | ICD-10-CM | POA: Insufficient documentation

## 2023-06-05 DIAGNOSIS — Z87891 Personal history of nicotine dependence: Secondary | ICD-10-CM | POA: Insufficient documentation

## 2023-06-05 DIAGNOSIS — Z7982 Long term (current) use of aspirin: Secondary | ICD-10-CM | POA: Diagnosis not present

## 2023-06-05 DIAGNOSIS — D696 Thrombocytopenia, unspecified: Secondary | ICD-10-CM | POA: Insufficient documentation

## 2023-06-05 DIAGNOSIS — Z79899 Other long term (current) drug therapy: Secondary | ICD-10-CM | POA: Insufficient documentation

## 2023-06-05 DIAGNOSIS — D509 Iron deficiency anemia, unspecified: Secondary | ICD-10-CM | POA: Insufficient documentation

## 2023-06-05 LAB — CBC (CANCER CENTER ONLY)
HCT: 39.7 % (ref 39.0–52.0)
Hemoglobin: 13.2 g/dL (ref 13.0–17.0)
MCH: 30.1 pg (ref 26.0–34.0)
MCHC: 33.2 g/dL (ref 30.0–36.0)
MCV: 90.4 fL (ref 80.0–100.0)
Platelet Count: 137 10*3/uL — ABNORMAL LOW (ref 150–400)
RBC: 4.39 MIL/uL (ref 4.22–5.81)
RDW: 12.1 % (ref 11.5–15.5)
WBC Count: 5.4 10*3/uL (ref 4.0–10.5)
nRBC: 0 % (ref 0.0–0.2)

## 2023-06-05 LAB — IRON AND TIBC
Iron: 102 ug/dL (ref 45–182)
Saturation Ratios: 33 % (ref 17.9–39.5)
TIBC: 311 ug/dL (ref 250–450)
UIBC: 209 ug/dL

## 2023-06-05 LAB — FERRITIN: Ferritin: 60 ng/mL (ref 24–336)

## 2023-06-05 NOTE — Progress Notes (Signed)
 Hematology/Oncology Consult note [facility]  [phone] [phone]  Patient Care Team: Doreene Nest, NP as PCP - General (Nurse Practitioner) Marykay Lex, MD as PCP - Cardiology (Cardiology) Sallye Lat, MD as Consulting Physician (Ophthalmology) Elinor Parkinson, [place] as Consulting Physician (Podiatry) Teryl Lucy, MD as Consulting Physician (Orthopedic Surgery) Andrena Mews, DO as Consulting Physician (Sports Medicine) Bradly Bienenstock, MD as Consulting Physician (Orthopedic Surgery) [name], Latina Craver, MD (Vascular Surgery) Creig Hines, MD as Consulting Physician (Oncology)   Name of the patient: Alan [name]  [id number]  [birth year]   Date of visit: 06/05/23  Diagnosis- 1. Thrombocytopenia 2. Iron deficiency anemia  Chief complaint/ Reason for visit-routine follow-up of anemia and thrombocytopenia  Heme/Onc history: Patient is a 78 year old male with a past medical history significant for coronary artery disease, carotid stenosis COPD among other medical problems who has been referred for thrombocytopenia patient has had some longstanding thrombocytopenia with a platelet count that has fluctuated between 1 30-1 40 at least dating back to 2022. White count is normal and his hemoglobin over the last 1 year has been between 10-11.    Results of blood work from 11/26/2022 were as follows: CBC showed white count of 5.6, H&H of 11.8/36.9 and a platelet count of 130.  B12 levels normal at 509.  H. pylori stool antigen negative.  Folate normal.  HIV and hepatitis C testing negative.  Ferritin levels low at 11 with an iron saturation of 11%.  Patient is on oral iron for the same    Interval history-he is tolerating once a day iron pill well without any significant side effects.  Denies any blood loss in his stool or urine.  Denies any bleeding or bruising.  ECOG PS- 1  Pain scale- 0   Review of systems- Review of  Systems  Constitutional:  Negative for chills, fever, malaise/fatigue and weight loss.  HENT:  Negative for congestion, ear discharge and nosebleeds.   Eyes:  Negative for blurred vision.  Respiratory:  Negative for cough, hemoptysis, sputum production, shortness of breath and wheezing.   Cardiovascular:  Negative for chest pain, palpitations, orthopnea and claudication.  Gastrointestinal:  Negative for abdominal pain, blood in stool, constipation, diarrhea, heartburn, melena, nausea and vomiting.  Genitourinary:  Negative for dysuria, flank pain, frequency, hematuria and urgency.  Musculoskeletal:  Negative for back pain, joint pain and myalgias.  Skin:  Negative for rash.  Neurological:  Negative for dizziness, tingling, focal weakness, seizures, weakness and headaches.  Endo/Heme/Allergies:  Does not bruise/bleed easily.  Psychiatric/Behavioral:  Negative for depression and suicidal ideas. The patient does not have insomnia.       No Known Allergies   Past Medical History:  Diagnosis Date   Allergic rhinitis, cause unspecified    Allergy    Anxiety    Cancer (HCC)    Basal cell skin cancer,left ear   Carotid artery stenosis    left   Carotid stenosis, symptomatic w/o infarct, left 04/11/2022   Carpal tunnel syndrome    Chronic airway obstruction, not elsewhere classified    Chronic cough 05/25/2016   COPD with acute exacerbation (HCC) 10/02/2007   Qualifier: Diagnosis of  By: Maple Hudson MD, Clinton D    Coronary artery disease    Depression    Dizziness 12/02/2020   Dyspnea    Esophageal reflux    Glaucoma (increased eye pressure)    History of kidney stones    Lipoma of unspecified  site    Osteoarthrosis, unspecified whether generalized or localized, lower leg    Other and unspecified hyperlipidemia    Paresthesia of left leg 11/17/2019   Peripheral vascular disease (HCC)    Personal history of colonic polyps    Personal history of other malignant neoplasm of skin     PONV (postoperative nausea and vomiting)    Stroke [facility])    " mini stroke"   Unspecified essential hypertension    Viral gastroenteritis 09/20/2021     Past Surgical History:  Procedure Laterality Date   BACK SURGERY     lumbar disc surgery   bone spur  06/2003   right thumb   CAROTID ANGIOGRAPHY Left 01/23/2022   Procedure: CAROTID ANGIOGRAPHY;  Surgeon: Renford Dills, MD;  Location: [facility] INVASIVE CV LAB;  Service: Cardiovascular;  Laterality: Left;   CATARACT EXTRACTION     COLONOSCOPY W/ POLYPECTOMY     COLONOSCOPY WITH ESOPHAGOGASTRODUODENOSCOPY (EGD)     ENDARTERECTOMY Left 04/11/2022   Procedure: ENDARTERECTOMY CAROTID;  Surgeon: Renford Dills, MD;  Location: [facility] ORS;  Service: Vascular;  Laterality: Left;   KNEE ARTHROSCOPY  09/2007   partial medial mesicecotmy, patellar chonfroplasty   SHOULDER SURGERY  03/24/2007   removal bone spur   SHOULDER SURGERY Left 07/2016   [facility], Dr. 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  Tobacco Use   Smoking status: Former    Current packs/day: 0.00    Average packs/day: 3.0 packs/day for 25.0 years (75.0 ttl pk-yrs)    Types: Cigarettes    Start date: 11/24/1971    Quit date: 11/23/1996    Years since quitting: 26.5   Smokeless tobacco: Never  Vaping Use   Vaping status: Never Used  Substance and Sexual Activity   Alcohol use: Not Currently    Alcohol/week: 0.0 standard drinks of alcohol   Drug use: No   Sexual activity: Yes    Partners: Female    Comment: monogamous relationship  Other Topics Concern   Not on file  Social History Narrative   Regular exercise -- no             Social Drivers of Corporate investment banker Strain: Low Risk  (11/08/2022)   Overall Financial Resource Strain (CARDIA)    Difficulty of Paying Living Expenses: Not hard at all  Food Insecurity: No Food Insecurity (11/26/2022)   Hunger Vital Sign    Worried About Running Out of Food in the Last Year: Never true    Ran Out of Food in the Last Year: Never true  Transportation Needs: No Transportation Needs (11/26/2022)   PRAPARE - Administrator, Civil Service (Medical): No    Lack of Transportation (Non-Medical): No  Physical Activity: Insufficiently Active (11/08/2022)   Exercise Vital Sign    Days of Exercise per Week: 1 day    Minutes of Exercise per Session: 10 min  Stress: No Stress Concern Present (11/08/2022)   [employer] of Occupational Health - Occupational Stress Questionnaire    Feeling of Stress : Only a little  Social  Connections: Moderately Integrated (11/08/2022)   Social Connection and Isolation Panel [NHANES]    Frequency of Communication with Friends and Family: More than three times a week    Frequency of Social Gatherings with Friends and Family: More than three times a week    Attends Religious Services: More than 4 times per year    Active Member of [employer] or Organizations: Yes    Attends Engineer, structural: More than 4 times per year    Marital Status: Divorced  Intimate Partner Violence: Not At Risk (11/26/2022)   Humiliation, Afraid, Rape, and Kick questionnaire    Fear of Current or Ex-Partner: No    Emotionally Abused: No    Physically Abused: No    Sexually Abused: No    Family History  Problem Relation Age of Onset   Diabetes Mother    Hyperlipidemia Mother    Hypertension Mother    Heart attack Mother    Obesity Mother    Hyperlipidemia Father    Hypertension Father    Lung cancer Father    Colon polyps Father    Hypertension Sister    Hyperlipidemia Sister    Lung cancer Sister    Hyperlipidemia Sister     Heart attack Brother    Diabetes Brother    Hyperlipidemia Brother    Hypertension Brother    Hyperlipidemia Brother    Hypertension Brother    Drug abuse Daughter    Colon cancer Neg Hx    Esophageal cancer Neg Hx    Pancreatic cancer Neg Hx    Rectal cancer Neg Hx      Current Outpatient Medications:    acetaminophen (TYLENOL) 500 MG tablet, Take 1,000 mg by mouth every 6 (six) hours as needed., Disp: , Rfl:    amLODipine (NORVASC) 10 MG tablet, TAKE 1 TABLET BY MOUTH EVERY DAY FOR BLOOD PRESSURE, Disp: 90 tablet, Rfl: 3   aspirin EC 81 MG tablet, Take 1 tablet (81 mg total) by mouth daily at 6 (six) AM. Swallow whole., Disp: 30 tablet, Rfl: 11   atorvastatin (LIPITOR) 80 MG tablet, TAKE 1 TABLET BY MOUTH EVERY DAY FOR CHOLESTEROL, Disp: 90 tablet, Rfl: 2   cyclobenzaprine (FLEXERIL) 5 MG tablet, Take 1 tablet (5 mg total) by mouth 3 (three) times daily as needed for muscle spasms., Disp: 15 tablet, Rfl: 0   fish oil-omega-3 fatty acids 1000 MG capsule, Take 2 g by mouth daily., Disp: , Rfl:    ketoconazole (NIZORAL) 2 % shampoo, as needed., Disp: , Rfl:    losartan (COZAAR) 100 MG tablet, TAKE 1 TABLET BY MOUTH EVERY DAY FOR BLOOD PRESSURE, Disp: 90 tablet, Rfl: 2   montelukast (SINGULAIR) 10 MG tablet, TAKE 1 TABLET (10 MG TOTAL) BY MOUTH AT BEDTIME FOR ALLERGIES, Disp: 90 tablet, Rfl: 0   Multiple Vitamin (MULTIVITAMIN) tablet, Take 1 tablet by mouth daily., Disp: , Rfl:    pantoprazole (PROTONIX) 40 MG tablet, TAKE 1 TABLET BY MOUTH EVERY DAY FOR HEARTBURN, Disp: 90 tablet, Rfl: 2   predniSONE (DELTASONE) 20 MG tablet, Take two tablets my mouth once daily in the morning for four days, then one tablet once daily in the morning for four days., Disp: 12 tablet, Rfl: 0   sertraline (ZOLOFT) 100 MG tablet, TAKE 1 TABLET (100 MG TOTAL) BY MOUTH DAILY. FOR ANXIETY AND DEPRESSION., Disp: 90 tablet, Rfl: 2  Physical exam:  Vitals:   06/05/23 0914  BP: 119/73  Pulse: 78  Resp: 14  Temp: (!) 97.4 F (36.3 C)  TempSrc: Tympanic  SpO2: 98%  Weight: 246 lb (111.6 kg)   Physical Exam Cardiovascular:     Rate and Rhythm: Normal rate and regular rhythm.     Heart sounds: Normal heart sounds.  Pulmonary:     Effort: Pulmonary effort is normal.     Breath sounds: Normal breath sounds.  Skin:    General: Skin is warm and dry.  Neurological:     Mental Status: He is alert and oriented to person, place, and time.         Latest Ref Rng & Units 11/09/2022    8:56 AM  CMP  Glucose 70 - 99 mg/dL 454   BUN 6 - 23 mg/dL 17   Creatinine 0.98 - 1.50 mg/dL 1.19   Sodium 147 - 829 mEq/L 139   Potassium 3.5 - 5.1 mEq/L 4.2   Chloride 96 - 112 mEq/L 101   CO2 19 - 32 mEq/L 32   Calcium 8.4 - 10.5 mg/dL 9.4   Total Protein 6.0 - 8.3 g/dL 6.4   Total Bilirubin 0.2 - 1.2 mg/dL 0.4   Alkaline Phos 39 - 117 U/L 58   AST 0 - 37 U/L 20   ALT 0 - 53 U/L 33       Latest Ref Rng & Units 06/05/2023    9:01 AM  CBC  WBC 4.0 - 10.5 K/uL 5.4   Hemoglobin 13.0 - 17.0 g/dL 56.2   Hematocrit 13.0 - 52.0 % 39.7   Platelets 150 - 400 K/uL 137      Assessment and plan- Patient is a 78 y.o. male here for routine follow-up of following issues:  Iron deficiency anemia: Patient is not presently anemic with a hemoglobin of 13.2/29.7.  Iron studies have improved and are presently within normal limits.  He does not require any IV iron at this time.  He will continue taking oral iron once a day or every other day.  If iron deficiency recurs we will refer him to GI at that time  Thrombocytopenia: Overall mild with a platelet count fluctuates between 120s to 140s.  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  Continue to monitor  CBC ferritin and iron studies in 6 months and 1 year I will see him back in 1 year   Visit Diagnosis 1. History of iron deficiency anemia   2. Thrombocytopenia (HCC)      Dr. Owens Shark, MD, MPH [facility] at [facility] [phone] 06/05/2023 12:33 PM

## 2023-06-05 NOTE — Telephone Encounter (Signed)
 Per Dr. Smith Robert  - Please let him know iron studies are now normal. He can take po iron every other day at this time   I spoke w/patient and provided him with the results and plan of care to take oral iron every other day. Pt gave verbal understanding of the plan.

## 2023-06-11 ENCOUNTER — Ambulatory Visit: Payer: Medicare HMO | Admitting: Oncology

## 2023-06-11 ENCOUNTER — Other Ambulatory Visit: Payer: Medicare HMO

## 2023-06-11 NOTE — Progress Notes (Unsigned)
   Alphonza Tramell T. Anjulie Dipierro, MD, CAQ Sports Medicine [facility] at [facility] [address] [place], [address]  Phone: [phone]  FAX: [phone]  PRINCE COUEY - 78 y.o. male  MRN [id number]  Date of Birth: 1945-04-17  Date: 06/12/2023  PCP: Doreene Nest, NP  Referral: Doreene Nest, NP  No chief complaint on file.  Subjective:   ANIKET PAYE is a 78 y.o. very pleasant male patient with There is no height or weight on file to calculate BMI. who presents with the following:  I remember this patient quite well and he presents with some ongoing hand paresthesias and discomfort that is been present for about 6 months.  He also additionally has some significant degenerative changes of the cervical spine.  He did see his primary care doctor a few weeks ago and she was concerned about possible carpal tunnel syndrome.  Symptoms do worsen with repetitive use of the hands.    Review of Systems is noted in the HPI, as appropriate  Objective:   There were no vitals taken for this visit.  GEN: No acute distress; alert,appropriate. PULM: Breathing comfortably in no respiratory distress PSYCH: Normally interactive.   Laboratory and Imaging Data:  Assessment and Plan:   ***

## 2023-06-12 ENCOUNTER — Encounter: Payer: Self-pay | Admitting: Family Medicine

## 2023-06-12 ENCOUNTER — Ambulatory Visit (INDEPENDENT_AMBULATORY_CARE_PROVIDER_SITE_OTHER): Admitting: Family Medicine

## 2023-06-12 VITALS — BP 112/60 | HR 75 | Temp 98.0°F | Ht 72.0 in | Wt 247.2 lb

## 2023-06-12 DIAGNOSIS — M542 Cervicalgia: Secondary | ICD-10-CM

## 2023-06-12 DIAGNOSIS — G5603 Carpal tunnel syndrome, bilateral upper limbs: Secondary | ICD-10-CM | POA: Diagnosis not present

## 2023-06-12 DIAGNOSIS — G8929 Other chronic pain: Secondary | ICD-10-CM

## 2023-06-12 DIAGNOSIS — R202 Paresthesia of skin: Secondary | ICD-10-CM | POA: Diagnosis not present

## 2023-06-12 MED ORDER — TRIAMCINOLONE ACETONIDE 40 MG/ML IJ SUSP
20.0000 mg | Freq: Once | INTRAMUSCULAR | Status: AC
  Administered 2023-06-12: 20 mg via INTRA_ARTICULAR

## 2023-06-19 ENCOUNTER — Ambulatory Visit: Payer: Medicare HMO | Admitting: Urology

## 2023-06-19 VITALS — BP 133/79 | HR 77

## 2023-06-19 DIAGNOSIS — Z792 Long term (current) use of antibiotics: Secondary | ICD-10-CM

## 2023-06-19 DIAGNOSIS — C61 Malignant neoplasm of prostate: Secondary | ICD-10-CM

## 2023-06-19 DIAGNOSIS — Z2989 Encounter for other specified prophylactic measures: Secondary | ICD-10-CM | POA: Diagnosis not present

## 2023-06-19 DIAGNOSIS — R972 Elevated prostate specific antigen [PSA]: Secondary | ICD-10-CM | POA: Diagnosis not present

## 2023-06-19 MED ORDER — LEVOFLOXACIN 500 MG PO TABS
500.0000 mg | ORAL_TABLET | Freq: Once | ORAL | Status: AC
  Administered 2023-06-19: 500 mg via ORAL

## 2023-06-19 MED ORDER — GENTAMICIN SULFATE 40 MG/ML IJ SOLN
80.0000 mg | Freq: Once | INTRAMUSCULAR | Status: AC
Start: 2023-06-19 — End: 2023-06-19
  Administered 2023-06-19: 80 mg via INTRAMUSCULAR

## 2023-06-19 NOTE — Progress Notes (Signed)
   06/19/23  Indication: Elevated PSA, 4.3  MRI Fusion Prostate Biopsy Procedure   Informed consent was obtained, and we discussed the risks of bleeding and infection/sepsis. A time out was performed to ensure correct patient identity.  Pre-Procedure: - Last PSA Level: 4.3 - Gentamicin and levaquin given for antibiotic prophylaxis -Prostate measured 34 g on MRI, PSA density 0.13 -Obvious hypoechoic region on ultrasound correlated with PI-RADS 5 lesion on MRI  Procedure: - Prostate block performed using 10 cc 1% lidocaine  - MRI fusion biopsy was performed:  ROI#1: PIRADS 5 lesion right posterior lateral peripheral zone (6 cores)  - Total of 6 cores taken  Post-Procedure: - Patient tolerated the procedure well - He was counseled to seek immediate medical attention if experiences significant bleeding, fevers, or severe pain - Return in one week to discuss biopsy results  Assessment/ Plan: Will follow up in 1-2 weeks to discuss pathology with Dr. Mort Sawyers, MD 06/19/2023

## 2023-06-19 NOTE — Patient Instructions (Signed)

## 2023-06-27 DIAGNOSIS — M19042 Primary osteoarthritis, left hand: Secondary | ICD-10-CM | POA: Diagnosis not present

## 2023-06-28 ENCOUNTER — Telehealth: Payer: Self-pay

## 2023-06-28 NOTE — Telephone Encounter (Signed)
 Patient left message on triage line requesting prostate biopsy results. He does not have a follow up appointment scheduled to review this and schedule is booked out for a month. Please advise

## 2023-07-03 ENCOUNTER — Ambulatory Visit (INDEPENDENT_AMBULATORY_CARE_PROVIDER_SITE_OTHER): Admitting: Physician Assistant

## 2023-07-03 VITALS — BP 125/76 | HR 78 | Ht 70.0 in | Wt 247.0 lb

## 2023-07-03 DIAGNOSIS — L03317 Cellulitis of buttock: Secondary | ICD-10-CM

## 2023-07-03 DIAGNOSIS — C61 Malignant neoplasm of prostate: Secondary | ICD-10-CM | POA: Diagnosis not present

## 2023-07-03 MED ORDER — MUPIROCIN 2 % EX OINT: TOPICAL_OINTMENT | CUTANEOUS | 0 refills | Status: DC

## 2023-07-03 NOTE — Progress Notes (Signed)
 07/03/2023 2:34 PM   Alan [name] [birth year] [id number]  CC: Chief Complaint  Patient presents with   Other   HPI: Alan [name] is a 78 y.o. male with PMH elevated PSA who underwent fusion biopsy with Dr. Richardo Hanks on 06/19/2023 who presents today for evaluation of possible gluteal infection and biopsy results.  He is accompanied today by his significant other, who contributes to HPI.  Today he reports he noticed a purulent, red spot on his right buttock a couple of days ago.  His significant other has been applying topical Neosporin and this has helped.  He denies any anal or rectal pain, bowel changes, or fevers.  Most recent PSA 4.29.  Fusion biopsy results were positive for Gleason 4+3 prostate cancer, grade group 3, comprising 60% of the tumor.  PMH: Past Medical History:  Diagnosis Date   Allergic rhinitis, cause unspecified    Allergy    Anxiety    Cancer (HCC)    Basal cell skin cancer,left ear   Carotid artery stenosis    left   Carotid stenosis, symptomatic w/o infarct, left 04/11/2022   Carpal tunnel syndrome    Chronic airway obstruction, not elsewhere classified    Chronic cough 05/25/2016   COPD with acute exacerbation (HCC) 10/02/2007   Qualifier: Diagnosis of  By: Maple Hudson MD, Clinton D    Coronary artery disease    Depression    Dizziness 12/02/2020   Dyspnea    Esophageal reflux    Glaucoma (increased eye pressure)    History of kidney stones    Lipoma of unspecified site    Osteoarthrosis, unspecified whether generalized or localized, lower leg    Other and unspecified hyperlipidemia    Paresthesia of left leg 11/17/2019   Peripheral vascular disease (HCC)    Personal history of colonic polyps    Personal history of other malignant neoplasm of skin    PONV (postoperative nausea and vomiting)    Stroke (HCC)    " mini stroke"   Unspecified essential hypertension    Viral gastroenteritis 09/20/2021    Surgical History: Past Surgical  History:  Procedure Laterality Date   BACK SURGERY     lumbar disc surgery   bone spur  06/2003   right thumb   CAROTID ANGIOGRAPHY Left 01/23/2022   Procedure: CAROTID ANGIOGRAPHY;  Surgeon: Renford Dills, MD;  Location: [facility] INVASIVE CV LAB;  Service: Cardiovascular;  Laterality: Left;   CATARACT EXTRACTION     COLONOSCOPY W/ POLYPECTOMY     COLONOSCOPY WITH ESOPHAGOGASTRODUODENOSCOPY (EGD)     ENDARTERECTOMY Left 04/11/2022   Procedure: ENDARTERECTOMY CAROTID;  Surgeon: Renford Dills, MD;  Location: [facility] ORS;  Service: Vascular;  Laterality: Left;   KNEE ARTHROSCOPY  09/2007   partial medial mesicecotmy, patellar chonfroplasty   SHOULDER SURGERY  03/24/2007   removal bone spur   SHOULDER SURGERY Left 07/2016   [facility], Dr. Olegario Shearer   TOTAL KNEE ARTHROPLASTY     03-2009 [facility]   TOTAL KNEE ARTHROPLASTY Left 12/2012   [facility]    Home Medications:  Allergies as of 07/03/2023   No Known Allergies      Medication List        Accurate as of July 03, 2023  2:34 PM. If you have any questions, ask your nurse or doctor.          acetaminophen 500 MG tablet Commonly known as: TYLENOL Take 1,000 mg by mouth every 6 (six) hours as  needed.   amLODipine 10 MG tablet Commonly known as: NORVASC TAKE 1 TABLET BY MOUTH EVERY DAY FOR BLOOD PRESSURE   aspirin EC 81 MG tablet Take 1 tablet (81 mg total) by mouth daily at 6 (six) AM. Swallow whole.   atorvastatin 80 MG tablet Commonly known as: LIPITOR TAKE 1 TABLET BY MOUTH EVERY DAY FOR CHOLESTEROL   cyclobenzaprine 5 MG tablet Commonly known as: FLEXERIL Take 1 tablet (5 mg total) by mouth 3 (three) times daily as needed for muscle spasms.   fish oil-omega-3 fatty acids 1000 MG capsule Take 2 g by mouth daily.   fluticasone 0.05 % cream Commonly known as: CUTIVATE APPLY TWICE DAILY TO FACE AND EARS FOR 1 WEEK ON AND 1 WEEK OFF, REPEAT AS NEEDED ONLY   ketoconazole 2 % shampoo Commonly known  as: NIZORAL as needed.   losartan 100 MG tablet Commonly known as: COZAAR TAKE 1 TABLET BY MOUTH EVERY DAY FOR BLOOD PRESSURE   montelukast 10 MG tablet Commonly known as: SINGULAIR TAKE 1 TABLET (10 MG TOTAL) BY MOUTH AT BEDTIME FOR ALLERGIES   multivitamin tablet Take 1 tablet by mouth daily.   mupirocin ointment 2 % Commonly known as: BACTROBAN Apply topically twice daily.   pantoprazole 40 MG tablet Commonly known as: PROTONIX TAKE 1 TABLET BY MOUTH EVERY DAY FOR HEARTBURN   sertraline 100 MG tablet Commonly known as: ZOLOFT TAKE 1 TABLET (100 MG TOTAL) BY MOUTH DAILY. FOR ANXIETY AND DEPRESSION.        Allergies:  No Known Allergies  Family History: Family History  Problem Relation Age of Onset   Diabetes Mother    Hyperlipidemia Mother    Hypertension Mother    Heart attack Mother    Obesity Mother    Hyperlipidemia Father    Hypertension Father    Lung cancer Father    Colon polyps Father    Hypertension Sister    Hyperlipidemia Sister    Lung cancer Sister    Hyperlipidemia Sister    Heart attack Brother    Diabetes Brother    Hyperlipidemia Brother    Hypertension Brother    Hyperlipidemia Brother    Hypertension Brother    Drug abuse Daughter    Colon cancer Neg Hx    Esophageal cancer Neg Hx    Pancreatic cancer Neg Hx    Rectal cancer Neg Hx     Social History:   reports that he quit smoking about 26 years ago. His smoking use included cigarettes. He started smoking about 51 years ago. He has a 75 pack-year smoking history. He has never used smokeless tobacco. He reports that he does not currently use alcohol. He reports that he does not use drugs.  Physical Exam: BP 125/76   Pulse 78   Ht 5\' 10"  (1.778 m)   Wt 247 lb (112 kg)   BMI 35.44 kg/m   Constitutional:  Alert and oriented, no acute distress, nontoxic appearing HEENT: [place], AT Cardiovascular: No clubbing, cyanosis, or edema Respiratory: Normal respiratory effort, no  increased work of breathing GI: 1 cm diameter superficial localized erythema without purulence, fluctuance, crepitus along the right gluteal cleft.  Erythema does not extend to the anus or any surrounding structures.  He denies tenderness deep to the wound.  There is a small focus of what appears to be healing unroofed portion of the wound. Skin: No rashes, bruises or suspicious lesions Neurologic: Grossly intact, no focal deficits, moving all 4 extremities Psychiatric: Normal  mood and affect  Assessment & Plan:   1. Cellulitis of buttock (Primary) Localized, superficial cellulitis on the right buttock.  No evidence of abscess or Fournier's gangrene.  Will switch him to mupirocin to reduce his risk for localized skin reactions.  We discussed return precautions including spreading erythema, worsening pain, and fevers.  Notably, he is traveling to the beach next week and I asked him to not submerge in the ocean water with his wound open. - mupirocin ointment (BACTROBAN) 2 %; Apply topically twice daily.  Dispense: 15 g; Refill: 0  2. 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  We discussed his results.  We discussed that treatment options generally include surgery versus radiation therapy with ADT.  We discussed increased risk for long-term urinary incontinence with surgical intervention in older patients.  He is more interested in pursuing radiation therapy regardless.  Will pursue PSMA PET for staging and have him follow-up with Dr. Lonna Cobb to discuss results.  Since he is most interested in radiation oncology anyway, we will go ahead and refer him to Dr. Rushie Chestnut now. - NM PET (PSMA) SKULL TO MID THIGH; Future - Ambulatory referral to Radiation Oncology   Return in about 3 weeks (around 07/24/2023) for PET results with Dr. Lonna Cobb.  Carman Ching, PA-C  [facility] [address], [address] [place], [place] [address] [phone]

## 2023-07-10 ENCOUNTER — Institutional Professional Consult (permissible substitution): Admitting: Radiation Oncology

## 2023-07-12 ENCOUNTER — Telehealth: Payer: Self-pay | Admitting: *Deleted

## 2023-07-12 NOTE — Telephone Encounter (Signed)
 Patient called and said that donna for an appt. And his phone [phone]. I called the pt and did not get an answer.  . He has an appt 4/17 at 9:30 to see Dr. Rushie Chestnut for radiation .I called again and then got a voicemail and left message of the above for the appt above

## 2023-07-17 ENCOUNTER — Telehealth: Payer: Self-pay | Admitting: *Deleted

## 2023-07-17 NOTE — Telephone Encounter (Signed)
 Called patient to clarify need for appointment.

## 2023-07-17 NOTE — Telephone Encounter (Signed)
 Patient called and he wants to change appt .of 4/17 to see Alan [name]. He wants to change it because that his pet scan in done 4/18  and the patient wants to have the results when he sees Dr. Jacalyn Martin. He wants a call back

## 2023-07-18 ENCOUNTER — Ambulatory Visit
Admission: RE | Admit: 2023-07-18 | Discharge: 2023-07-18 | Disposition: A | Discharge status: Home / Self Care | Source: Ambulatory Visit | Attending: Radiation Oncology | Admitting: Radiation Oncology

## 2023-07-18 ENCOUNTER — Encounter: Payer: Self-pay | Admitting: Radiation Oncology

## 2023-07-18 VITALS — BP 131/78 | HR 67 | Temp 97.2°F | Resp 16 | Ht 70.0 in | Wt 248.7 lb

## 2023-07-18 DIAGNOSIS — J449 Chronic obstructive pulmonary disease, unspecified: Secondary | ICD-10-CM | POA: Insufficient documentation

## 2023-07-18 DIAGNOSIS — C61 Malignant neoplasm of prostate: Secondary | ICD-10-CM | POA: Diagnosis not present

## 2023-07-18 DIAGNOSIS — E785 Hyperlipidemia, unspecified: Secondary | ICD-10-CM | POA: Diagnosis not present

## 2023-07-18 DIAGNOSIS — I2510 Atherosclerotic heart disease of native coronary artery without angina pectoris: Secondary | ICD-10-CM | POA: Diagnosis not present

## 2023-07-18 DIAGNOSIS — Z87442 Personal history of urinary calculi: Secondary | ICD-10-CM | POA: Diagnosis not present

## 2023-07-18 DIAGNOSIS — Z191 Hormone sensitive malignancy status: Secondary | ICD-10-CM | POA: Diagnosis not present

## 2023-07-18 DIAGNOSIS — I739 Peripheral vascular disease, unspecified: Secondary | ICD-10-CM | POA: Insufficient documentation

## 2023-07-18 DIAGNOSIS — Z79899 Other long term (current) drug therapy: Secondary | ICD-10-CM | POA: Insufficient documentation

## 2023-07-18 DIAGNOSIS — Z85828 Personal history of other malignant neoplasm of skin: Secondary | ICD-10-CM | POA: Diagnosis not present

## 2023-07-18 DIAGNOSIS — I10 Essential (primary) hypertension: Secondary | ICD-10-CM | POA: Diagnosis not present

## 2023-07-18 DIAGNOSIS — K219 Gastro-esophageal reflux disease without esophagitis: Secondary | ICD-10-CM | POA: Diagnosis not present

## 2023-07-18 DIAGNOSIS — Z801 Family history of malignant neoplasm of trachea, bronchus and lung: Secondary | ICD-10-CM | POA: Diagnosis not present

## 2023-07-18 DIAGNOSIS — Z87891 Personal history of nicotine dependence: Secondary | ICD-10-CM | POA: Diagnosis not present

## 2023-07-18 DIAGNOSIS — I6529 Occlusion and stenosis of unspecified carotid artery: Secondary | ICD-10-CM | POA: Diagnosis not present

## 2023-07-18 DIAGNOSIS — Z7982 Long term (current) use of aspirin: Secondary | ICD-10-CM | POA: Diagnosis not present

## 2023-07-18 NOTE — Consult Note (Signed)
 NEW PATIENT EVALUATION  Name: Alan [name]  MRN: [id number]  Date:   07/18/2023     DOB: 07-Jul-1945   This 78 y.o. 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  REFERRING PHYSICIAN: Carman Ching,*  CHIEF COMPLAINT:  Chief Complaint  Patient presents with   Prostate Cancer    DIAGNOSIS: The encounter diagnosis was Malignant neoplasm of prostate (HCC).   PREVIOUS INVESTIGATIONS:  MRI scan reviewed PSMA PET scan pending Pathology reports reviewed Clinical notes reviewed  HPI: Patient is a 78 year old male who presented with a bump in his PSA from the prior year from 2.3 up to 4.47.  Physical exam showed no nodularity in the prostate.  He underwent an MRI scan showing a category 5 PI-RADS lesion in the right posterior lateral peripheral zone.  There was mild capsular bulging vicinity raising a possibility of trans capsular spread.  He had no evidence of bone metastasis or pelvic adenopathy.  He underwent UroNav biopsy which was positive for Gleason 7 (4+3) adenocarcinoma consisting of 60% of the tumor tissue submitted.  Patient is fairly asymptomatic specifically denies any increased low urinary tract symptoms diarrhea or fatigue he is having no bone pain.  He is scheduled for PSMA PET scan tomorrow.  PLANNED TREATMENT REGIMEN: Image guided IMRT radiation therapy plus ADT  PAST MEDICAL HISTORY:  has a past medical history of Allergic rhinitis, cause unspecified, Allergy, Anxiety, Cancer (HCC), Carotid artery stenosis, Carotid stenosis, symptomatic w/o infarct, left (04/11/2022), Carpal tunnel syndrome, Chronic airway obstruction, not elsewhere classified, Chronic cough (05/25/2016), COPD with acute exacerbation (HCC) (10/02/2007), Coronary artery disease, Depression, Dizziness (12/02/2020), Dyspnea, Esophageal reflux, Glaucoma (increased eye  pressure), History of kidney stones, Lipoma of unspecified site, Osteoarthrosis, unspecified whether generalized or localized, lower leg, Other and unspecified hyperlipidemia, Paresthesia of left leg (11/17/2019), Peripheral vascular disease (HCC), Personal history of colonic polyps, Personal history of other malignant neoplasm of skin, PONV (postoperative nausea and vomiting), Stroke (HCC), Unspecified essential hypertension, and Viral gastroenteritis (09/20/2021).    PAST SURGICAL HISTORY:  Past Surgical History:  Procedure Laterality Date   BACK SURGERY     lumbar disc surgery   bone spur  06/2003   right thumb   CAROTID ANGIOGRAPHY Left 01/23/2022   Procedure: CAROTID ANGIOGRAPHY;  Surgeon: Renford Dills, MD;  Location: [facility];  Service: Cardiovascular;  Laterality: Left;   CATARACT EXTRACTION     COLONOSCOPY W/ POLYPECTOMY     COLONOSCOPY WITH ESOPHAGOGASTRODUODENOSCOPY (EGD)     ENDARTERECTOMY Left 04/11/2022   Procedure: ENDARTERECTOMY CAROTID;  Surgeon: Renford Dills, MD;  Location: [facility] ORS;  Service: Vascular;  Laterality: Left;   KNEE ARTHROSCOPY  09/2007   partial medial mesicecotmy, patellar chonfroplasty   SHOULDER SURGERY  03/24/2007   removal bone spur   SHOULDER SURGERY Left 07/2016   [facility], Dr. Olegario Shearer   TOTAL KNEE ARTHROPLASTY     03-2009 [facility]   TOTAL KNEE ARTHROPLASTY Left 12/2012   [facility]    FAMILY HISTORY: family history includes Colon polyps in his father; Diabetes in his brother and mother; Drug abuse in his daughter; Heart attack in his brother and mother; Hyperlipidemia in his brother, brother, father, mother, sister, and sister; Hypertension in his brother, brother, father, mother, and sister; Lung cancer in his father and sister; Obesity in his mother.  SOCIAL HISTORY:  reports  that he quit smoking about 26 years ago. His smoking use included cigarettes. He started smoking about 51 years ago. He has a 75  pack-year smoking history. He has never used smokeless tobacco. He reports that he does not currently use alcohol. He reports that he does not use drugs.  ALLERGIES: Patient has no known allergies.  MEDICATIONS:  Current Outpatient Medications  Medication Sig Dispense Refill   acetaminophen (TYLENOL) 500 MG tablet Take 1,000 mg by mouth every 6 (six) hours as needed.     amLODipine (NORVASC) 10 MG tablet TAKE 1 TABLET BY MOUTH EVERY DAY FOR BLOOD PRESSURE 90 tablet 3   aspirin EC 81 MG tablet Take 1 tablet (81 mg total) by mouth daily at 6 (six) AM. Swallow whole. 30 tablet 11   atorvastatin (LIPITOR) 80 MG tablet TAKE 1 TABLET BY MOUTH EVERY DAY FOR CHOLESTEROL 90 tablet 2   cyclobenzaprine (FLEXERIL) 5 MG tablet Take 1 tablet (5 mg total) by mouth 3 (three) times daily as needed for muscle spasms. 15 tablet 0   fish oil-omega-3 fatty acids 1000 MG capsule Take 2 g by mouth daily.     fluticasone (CUTIVATE) 0.05 % cream APPLY TWICE DAILY TO FACE AND EARS FOR 1 WEEK ON AND 1 WEEK OFF, REPEAT AS NEEDED ONLY     ketoconazole (NIZORAL) 2 % shampoo as needed.     losartan (COZAAR) 100 MG tablet TAKE 1 TABLET BY MOUTH EVERY DAY FOR BLOOD PRESSURE 90 tablet 2   montelukast (SINGULAIR) 10 MG tablet TAKE 1 TABLET (10 MG TOTAL) BY MOUTH AT BEDTIME FOR ALLERGIES 90 tablet 0   Multiple Vitamin (MULTIVITAMIN) tablet Take 1 tablet by mouth daily.     mupirocin ointment (BACTROBAN) 2 % Apply topically twice daily. 15 g 0   pantoprazole (PROTONIX) 40 MG tablet TAKE 1 TABLET BY MOUTH EVERY DAY FOR HEARTBURN 90 tablet 2   sertraline (ZOLOFT) 100 MG tablet TAKE 1 TABLET (100 MG TOTAL) BY MOUTH DAILY. FOR ANXIETY AND DEPRESSION. 90 tablet 2   No current facility-administered medications for this encounter.    ECOG PERFORMANCE STATUS:  0 - Asymptomatic  REVIEW OF SYSTEMS: Patient has history of basal cell carcinoma the left ear carotid artery stenosis COPD coronary artery disease esophageal reflux glaucoma  history of renal calculi Patient denies any weight loss, fatigue, weakness, fever, chills or night sweats. Patient denies any loss of vision, blurred vision. Patient denies any ringing  of the ears or hearing loss. No irregular heartbeat. Patient denies heart murmur or history of fainting. Patient denies any chest pain or pain radiating to her upper extremities. Patient denies any shortness of breath, difficulty breathing at night, cough or hemoptysis. Patient denies any swelling in the lower legs. Patient denies any nausea vomiting, vomiting of blood, or coffee ground material in the vomitus. Patient denies any stomach pain. Patient states has had normal bowel movements no significant constipation or diarrhea. Patient denies any dysuria, hematuria or significant nocturia. Patient denies any problems walking, swelling in the joints or loss of balance. Patient denies any skin changes, loss of hair or loss of weight. Patient denies any excessive worrying or anxiety or significant depression. Patient denies any problems with insomnia. Patient denies excessive thirst, polyuria, polydipsia. Patient denies any swollen glands, patient denies easy bruising or easy bleeding. Patient denies any recent infections, allergies or URI. Patient "s visual fields have not changed significantly in recent time.   PHYSICAL EXAM: BP 131/78   Pulse 67  Temp (!) 97.2 F (36.2 C)   Resp 16   Ht 5\' 10"  (1.778 m)   Wt 248 lb 11.2 oz (112.8 kg)   BMI 35.68 kg/m  Well-developed well-nourished patient in NAD. HEENT reveals PERLA, EOMI, discs not visualized.  Oral cavity is clear. No oral mucosal lesions are identified. Neck is clear without evidence of cervical or supraclavicular adenopathy. Lungs are clear to A&P. Cardiac examination is essentially unremarkable with regular rate and rhythm without murmur rub or thrill. Abdomen is benign with no organomegaly or masses noted. Motor sensory and DTR levels are equal and symmetric in  the upper and lower extremities. Cranial nerves II through XII are grossly intact. Proprioception is intact. No peripheral adenopathy or edema is identified. No motor or sensory levels are noted. Crude visual fields are within normal range.  LABORATORY DATA: Pathology reports reviewed    RADIOLOGY RESULTS: MRI scan reviewed PSMA PET scan will be reviewed prior to simulation   IMPRESSION: Stage IIc Gleason 7 (4+3) adenocarcinoma presented with a PSA in the 4 range in 78 year old male  PLAN: At this time we will review his PSMA PET scan prior to initiating simulation.  I have recommended image guided IMRT radiation therapy.  Will plan on delivering 80 Gray over 8 weeks to his prostate.  I have run the [facility] showing extremely low chance of lymph node involvement.  Risks and benefits of treatment including increased lower Neri tract symptoms possible diarrhea fatigue skin reaction all reviewed with the patient.  I have asked urology to place fiducial markers in his prostate as well as start him on 14-month Eligard depot.  Patient comprehends my recommendations well.  Simulation will be scheduled after his markers are placed.  Patient comprehends my treatment plan well.  I would like to take this opportunity to thank you for allowing me to participate in the care of your patient.Glenis Langdon, MD

## 2023-07-19 ENCOUNTER — Ambulatory Visit
Admission: RE | Admit: 2023-07-19 | Discharge: 2023-07-19 | Disposition: A | Discharge status: Home / Self Care | Source: Ambulatory Visit | Attending: Physician Assistant

## 2023-07-19 DIAGNOSIS — C61 Malignant neoplasm of prostate: Secondary | ICD-10-CM | POA: Insufficient documentation

## 2023-07-19 MED ORDER — FLOTUFOLASTAT F 18 GALLIUM 296-5846 MBQ/ML IV SOLN
8.0000 | Freq: Once | INTRAVENOUS | Status: AC
  Administered 2023-07-19: 8.74 via INTRAVENOUS
  Filled 2023-07-19: qty 8

## 2023-07-31 ENCOUNTER — Encounter: Payer: Self-pay | Admitting: Urology

## 2023-07-31 DIAGNOSIS — C61 Malignant neoplasm of prostate: Secondary | ICD-10-CM | POA: Diagnosis not present

## 2023-07-31 DIAGNOSIS — Z191 Hormone sensitive malignancy status: Secondary | ICD-10-CM | POA: Diagnosis not present

## 2023-08-08 ENCOUNTER — Ambulatory Visit: Admitting: Urology

## 2023-08-08 ENCOUNTER — Encounter: Payer: Self-pay | Admitting: Urology

## 2023-08-08 VITALS — BP 136/78 | HR 81 | Ht 72.0 in | Wt 245.0 lb

## 2023-08-08 DIAGNOSIS — C61 Malignant neoplasm of prostate: Secondary | ICD-10-CM

## 2023-08-08 DIAGNOSIS — Z2989 Encounter for other specified prophylactic measures: Secondary | ICD-10-CM | POA: Diagnosis not present

## 2023-08-08 MED ORDER — LEVOFLOXACIN 500 MG PO TABS
500.0000 mg | ORAL_TABLET | Freq: Once | ORAL | Status: AC
  Administered 2023-08-08: 500 mg via ORAL

## 2023-08-08 MED ORDER — LEUPROLIDE ACETATE (6 MONTH) 45 MG ~~LOC~~ KIT
45.0000 mg | PACK | Freq: Once | SUBCUTANEOUS | Status: AC
  Administered 2023-08-08: 45 mg via SUBCUTANEOUS

## 2023-08-08 MED ORDER — GENTAMICIN SULFATE 40 MG/ML IJ SOLN
80.0000 mg | Freq: Once | INTRAMUSCULAR | Status: AC
Start: 2023-08-08 — End: 2023-08-08
  Administered 2023-08-08: 80 mg via INTRAMUSCULAR

## 2023-08-08 NOTE — Progress Notes (Signed)
   08/08/23  CC: gold fiducial marker placement  HPI: 78 y.o. male with prostate cancer who presents today for placement of fiducial markers in anticipation of his upcoming IMRT with Dr. Jacalyn Martin.  Prostate Gold fiducial Marker Placement Procedure   Informed consent was obtained after discussing risks/benefits of the procedure.  A time out was performed to ensure correct patient identity.  Pre-Procedure: - Gentamicin  given prophylactically - PO Levaquin  500 mg also given today  Procedure: - Rectal ultrasound probe was placed without difficulty and the prostate visualized - Prostatic block performed with 10 mL 1% Xylocaine  - 3 fiducial gold seed markers placed, one at right base, one at left base, one at apex of prostate gland under transrectal ultrasound guidance  Post-Procedure: - Patient tolerated the procedure well - He was counseled to seek immediate medical attention if experiences any severe pain, significant bleeding, or fevers    Darlynn Elam, MD

## 2023-08-08 NOTE — Progress Notes (Signed)
 Eligard  SubQ Injection   Due to Prostate Cancer patient is present today for a Eligard  Injection.  Medication: Eligard  6 month Dose: 45 mg  Location: right  Lot: [id number] Exp: 11/24/2024  Patient tolerated well, no complications were noted  Performed by: Theodosia Fishman CMA  Per Dr.Chrystal he recommended a one-time 59-month dose.  Vitamin D 800-1000iu and Calcium  1000-1200mg  daily while on Androgen Deprivation Therapy.  PA approval dates:  No Auth needed.

## 2023-08-13 ENCOUNTER — Ambulatory Visit
Admission: RE | Admit: 2023-08-13 | Discharge: 2023-08-13 | Disposition: A | Discharge status: Home / Self Care | Source: Ambulatory Visit | Attending: Radiation Oncology | Admitting: Radiation Oncology

## 2023-08-13 DIAGNOSIS — K219 Gastro-esophageal reflux disease without esophagitis: Secondary | ICD-10-CM | POA: Insufficient documentation

## 2023-08-13 DIAGNOSIS — E785 Hyperlipidemia, unspecified: Secondary | ICD-10-CM | POA: Insufficient documentation

## 2023-08-13 DIAGNOSIS — Z7982 Long term (current) use of aspirin: Secondary | ICD-10-CM | POA: Insufficient documentation

## 2023-08-13 DIAGNOSIS — J449 Chronic obstructive pulmonary disease, unspecified: Secondary | ICD-10-CM | POA: Insufficient documentation

## 2023-08-13 DIAGNOSIS — Z801 Family history of malignant neoplasm of trachea, bronchus and lung: Secondary | ICD-10-CM | POA: Diagnosis not present

## 2023-08-13 DIAGNOSIS — Z87442 Personal history of urinary calculi: Secondary | ICD-10-CM | POA: Diagnosis not present

## 2023-08-13 DIAGNOSIS — Z79899 Other long term (current) drug therapy: Secondary | ICD-10-CM | POA: Diagnosis not present

## 2023-08-13 DIAGNOSIS — I2510 Atherosclerotic heart disease of native coronary artery without angina pectoris: Secondary | ICD-10-CM | POA: Diagnosis not present

## 2023-08-13 DIAGNOSIS — I6529 Occlusion and stenosis of unspecified carotid artery: Secondary | ICD-10-CM | POA: Insufficient documentation

## 2023-08-13 DIAGNOSIS — I10 Essential (primary) hypertension: Secondary | ICD-10-CM | POA: Diagnosis not present

## 2023-08-13 DIAGNOSIS — Z87891 Personal history of nicotine dependence: Secondary | ICD-10-CM | POA: Insufficient documentation

## 2023-08-13 DIAGNOSIS — Z191 Hormone sensitive malignancy status: Secondary | ICD-10-CM | POA: Diagnosis not present

## 2023-08-13 DIAGNOSIS — I739 Peripheral vascular disease, unspecified: Secondary | ICD-10-CM | POA: Insufficient documentation

## 2023-08-13 DIAGNOSIS — Z85828 Personal history of other malignant neoplasm of skin: Secondary | ICD-10-CM | POA: Diagnosis not present

## 2023-08-13 DIAGNOSIS — C61 Malignant neoplasm of prostate: Secondary | ICD-10-CM | POA: Insufficient documentation

## 2023-08-16 ENCOUNTER — Other Ambulatory Visit: Payer: Self-pay | Admitting: Primary Care

## 2023-08-16 DIAGNOSIS — F3342 Major depressive disorder, recurrent, in full remission: Secondary | ICD-10-CM

## 2023-08-16 DIAGNOSIS — F411 Generalized anxiety disorder: Secondary | ICD-10-CM

## 2023-08-16 DIAGNOSIS — E782 Mixed hyperlipidemia: Secondary | ICD-10-CM

## 2023-08-16 DIAGNOSIS — K219 Gastro-esophageal reflux disease without esophagitis: Secondary | ICD-10-CM

## 2023-08-16 DIAGNOSIS — I10 Essential (primary) hypertension: Secondary | ICD-10-CM

## 2023-08-19 ENCOUNTER — Ambulatory Visit (INDEPENDENT_AMBULATORY_CARE_PROVIDER_SITE_OTHER): Payer: Medicare HMO | Admitting: Vascular Surgery

## 2023-08-19 ENCOUNTER — Encounter (INDEPENDENT_AMBULATORY_CARE_PROVIDER_SITE_OTHER): Payer: Medicare HMO

## 2023-08-19 DIAGNOSIS — Z87442 Personal history of urinary calculi: Secondary | ICD-10-CM | POA: Diagnosis not present

## 2023-08-19 DIAGNOSIS — I2510 Atherosclerotic heart disease of native coronary artery without angina pectoris: Secondary | ICD-10-CM | POA: Diagnosis not present

## 2023-08-19 DIAGNOSIS — I10 Essential (primary) hypertension: Secondary | ICD-10-CM | POA: Diagnosis not present

## 2023-08-19 DIAGNOSIS — I6529 Occlusion and stenosis of unspecified carotid artery: Secondary | ICD-10-CM | POA: Diagnosis not present

## 2023-08-19 DIAGNOSIS — I739 Peripheral vascular disease, unspecified: Secondary | ICD-10-CM | POA: Diagnosis not present

## 2023-08-19 DIAGNOSIS — J449 Chronic obstructive pulmonary disease, unspecified: Secondary | ICD-10-CM | POA: Diagnosis not present

## 2023-08-19 DIAGNOSIS — E785 Hyperlipidemia, unspecified: Secondary | ICD-10-CM | POA: Diagnosis not present

## 2023-08-19 DIAGNOSIS — K219 Gastro-esophageal reflux disease without esophagitis: Secondary | ICD-10-CM | POA: Diagnosis not present

## 2023-08-19 DIAGNOSIS — C61 Malignant neoplasm of prostate: Secondary | ICD-10-CM | POA: Diagnosis not present

## 2023-08-19 DIAGNOSIS — Z191 Hormone sensitive malignancy status: Secondary | ICD-10-CM | POA: Diagnosis not present

## 2023-08-19 NOTE — Telephone Encounter (Signed)
Patient is due for CPE/follow up in mid August, this will be required prior to any further refills.  Please schedule, thank you!   

## 2023-08-20 ENCOUNTER — Encounter (INDEPENDENT_AMBULATORY_CARE_PROVIDER_SITE_OTHER): Payer: Self-pay

## 2023-08-20 NOTE — Telephone Encounter (Signed)
 Spoke to pt, scheduled cpe for 11/12/23

## 2023-08-21 ENCOUNTER — Other Ambulatory Visit: Payer: Self-pay | Admitting: *Deleted

## 2023-08-21 ENCOUNTER — Ambulatory Visit
Admission: RE | Admit: 2023-08-21 | Discharge: 2023-08-21 | Disposition: A | Discharge status: Home / Self Care | Source: Ambulatory Visit | Attending: Radiation Oncology | Admitting: Radiation Oncology

## 2023-08-21 DIAGNOSIS — C61 Malignant neoplasm of prostate: Secondary | ICD-10-CM

## 2023-08-22 ENCOUNTER — Ambulatory Visit
Admission: RE | Admit: 2023-08-22 | Discharge: 2023-08-22 | Disposition: A | Discharge status: Home / Self Care | Source: Ambulatory Visit | Attending: Radiation Oncology | Admitting: Radiation Oncology

## 2023-08-22 ENCOUNTER — Other Ambulatory Visit: Payer: Self-pay

## 2023-08-22 DIAGNOSIS — Z510 Encounter for antineoplastic radiation therapy: Secondary | ICD-10-CM | POA: Diagnosis not present

## 2023-08-22 DIAGNOSIS — I10 Essential (primary) hypertension: Secondary | ICD-10-CM | POA: Diagnosis not present

## 2023-08-22 DIAGNOSIS — E785 Hyperlipidemia, unspecified: Secondary | ICD-10-CM | POA: Diagnosis not present

## 2023-08-22 DIAGNOSIS — I6529 Occlusion and stenosis of unspecified carotid artery: Secondary | ICD-10-CM | POA: Diagnosis not present

## 2023-08-22 DIAGNOSIS — Z87442 Personal history of urinary calculi: Secondary | ICD-10-CM | POA: Diagnosis not present

## 2023-08-22 DIAGNOSIS — I2510 Atherosclerotic heart disease of native coronary artery without angina pectoris: Secondary | ICD-10-CM | POA: Diagnosis not present

## 2023-08-22 DIAGNOSIS — J449 Chronic obstructive pulmonary disease, unspecified: Secondary | ICD-10-CM | POA: Diagnosis not present

## 2023-08-22 DIAGNOSIS — Z191 Hormone sensitive malignancy status: Secondary | ICD-10-CM | POA: Diagnosis not present

## 2023-08-22 DIAGNOSIS — I739 Peripheral vascular disease, unspecified: Secondary | ICD-10-CM | POA: Diagnosis not present

## 2023-08-22 DIAGNOSIS — C61 Malignant neoplasm of prostate: Secondary | ICD-10-CM | POA: Diagnosis not present

## 2023-08-22 DIAGNOSIS — K219 Gastro-esophageal reflux disease without esophagitis: Secondary | ICD-10-CM | POA: Diagnosis not present

## 2023-08-22 LAB — RAD ONC ARIA SESSION SUMMARY
Course Elapsed Days: 0
Plan Fractions Treated to Date: 1
Plan Prescribed Dose Per Fraction: 2 Gy
Plan Total Fractions Prescribed: 40
Plan Total Prescribed Dose: 80 Gy
Reference Point Dosage Given to Date: 2 Gy
Reference Point Session Dosage Given: 2 Gy
Session Number: 1

## 2023-08-23 ENCOUNTER — Other Ambulatory Visit: Payer: Self-pay

## 2023-08-23 ENCOUNTER — Ambulatory Visit
Admission: RE | Admit: 2023-08-23 | Discharge: 2023-08-23 | Disposition: A | Discharge status: Home / Self Care | Source: Ambulatory Visit | Attending: Radiation Oncology | Admitting: Radiation Oncology

## 2023-08-23 DIAGNOSIS — I739 Peripheral vascular disease, unspecified: Secondary | ICD-10-CM | POA: Diagnosis not present

## 2023-08-23 DIAGNOSIS — K219 Gastro-esophageal reflux disease without esophagitis: Secondary | ICD-10-CM | POA: Diagnosis not present

## 2023-08-23 DIAGNOSIS — Z510 Encounter for antineoplastic radiation therapy: Secondary | ICD-10-CM | POA: Diagnosis not present

## 2023-08-23 DIAGNOSIS — I10 Essential (primary) hypertension: Secondary | ICD-10-CM | POA: Diagnosis not present

## 2023-08-23 DIAGNOSIS — I2510 Atherosclerotic heart disease of native coronary artery without angina pectoris: Secondary | ICD-10-CM | POA: Diagnosis not present

## 2023-08-23 DIAGNOSIS — I6529 Occlusion and stenosis of unspecified carotid artery: Secondary | ICD-10-CM | POA: Diagnosis not present

## 2023-08-23 DIAGNOSIS — Z191 Hormone sensitive malignancy status: Secondary | ICD-10-CM | POA: Diagnosis not present

## 2023-08-23 DIAGNOSIS — C61 Malignant neoplasm of prostate: Secondary | ICD-10-CM | POA: Diagnosis not present

## 2023-08-23 DIAGNOSIS — J449 Chronic obstructive pulmonary disease, unspecified: Secondary | ICD-10-CM | POA: Diagnosis not present

## 2023-08-23 DIAGNOSIS — Z87442 Personal history of urinary calculi: Secondary | ICD-10-CM | POA: Diagnosis not present

## 2023-08-23 DIAGNOSIS — E785 Hyperlipidemia, unspecified: Secondary | ICD-10-CM | POA: Diagnosis not present

## 2023-08-23 LAB — RAD ONC ARIA SESSION SUMMARY
Course Elapsed Days: 1
Plan Fractions Treated to Date: 2
Plan Prescribed Dose Per Fraction: 2 Gy
Plan Total Fractions Prescribed: 40
Plan Total Prescribed Dose: 80 Gy
Reference Point Dosage Given to Date: 4 Gy
Reference Point Session Dosage Given: 2 Gy
Session Number: 2

## 2023-08-27 ENCOUNTER — Ambulatory Visit
Admission: RE | Admit: 2023-08-27 | Discharge: 2023-08-27 | Disposition: A | Discharge status: Home / Self Care | Source: Ambulatory Visit | Attending: Radiation Oncology | Admitting: Radiation Oncology

## 2023-08-27 ENCOUNTER — Encounter (INDEPENDENT_AMBULATORY_CARE_PROVIDER_SITE_OTHER)

## 2023-08-27 ENCOUNTER — Ambulatory Visit (INDEPENDENT_AMBULATORY_CARE_PROVIDER_SITE_OTHER): Admitting: Nurse Practitioner

## 2023-08-27 ENCOUNTER — Other Ambulatory Visit: Payer: Self-pay

## 2023-08-27 DIAGNOSIS — Z191 Hormone sensitive malignancy status: Secondary | ICD-10-CM | POA: Diagnosis not present

## 2023-08-27 DIAGNOSIS — I10 Essential (primary) hypertension: Secondary | ICD-10-CM | POA: Diagnosis not present

## 2023-08-27 DIAGNOSIS — E785 Hyperlipidemia, unspecified: Secondary | ICD-10-CM | POA: Diagnosis not present

## 2023-08-27 DIAGNOSIS — I6529 Occlusion and stenosis of unspecified carotid artery: Secondary | ICD-10-CM | POA: Diagnosis not present

## 2023-08-27 DIAGNOSIS — Z510 Encounter for antineoplastic radiation therapy: Secondary | ICD-10-CM | POA: Diagnosis not present

## 2023-08-27 DIAGNOSIS — I2510 Atherosclerotic heart disease of native coronary artery without angina pectoris: Secondary | ICD-10-CM | POA: Diagnosis not present

## 2023-08-27 DIAGNOSIS — C61 Malignant neoplasm of prostate: Secondary | ICD-10-CM | POA: Diagnosis not present

## 2023-08-27 DIAGNOSIS — I739 Peripheral vascular disease, unspecified: Secondary | ICD-10-CM | POA: Diagnosis not present

## 2023-08-27 DIAGNOSIS — J449 Chronic obstructive pulmonary disease, unspecified: Secondary | ICD-10-CM | POA: Diagnosis not present

## 2023-08-27 DIAGNOSIS — K219 Gastro-esophageal reflux disease without esophagitis: Secondary | ICD-10-CM | POA: Diagnosis not present

## 2023-08-27 DIAGNOSIS — Z87442 Personal history of urinary calculi: Secondary | ICD-10-CM | POA: Diagnosis not present

## 2023-08-27 LAB — RAD ONC ARIA SESSION SUMMARY
Course Elapsed Days: 5
Plan Fractions Treated to Date: 3
Plan Prescribed Dose Per Fraction: 2 Gy
Plan Total Fractions Prescribed: 40
Plan Total Prescribed Dose: 80 Gy
Reference Point Dosage Given to Date: 6 Gy
Reference Point Session Dosage Given: 2 Gy
Session Number: 3

## 2023-08-28 ENCOUNTER — Ambulatory Visit
Admission: RE | Admit: 2023-08-28 | Discharge: 2023-08-28 | Disposition: A | Discharge status: Home / Self Care | Source: Ambulatory Visit | Attending: Radiation Oncology | Admitting: Radiation Oncology

## 2023-08-28 ENCOUNTER — Other Ambulatory Visit: Payer: Self-pay

## 2023-08-28 ENCOUNTER — Inpatient Hospital Stay

## 2023-08-28 ENCOUNTER — Other Ambulatory Visit: Payer: Self-pay | Admitting: *Deleted

## 2023-08-28 DIAGNOSIS — C61 Malignant neoplasm of prostate: Secondary | ICD-10-CM

## 2023-08-28 DIAGNOSIS — Z87442 Personal history of urinary calculi: Secondary | ICD-10-CM | POA: Diagnosis not present

## 2023-08-28 DIAGNOSIS — J449 Chronic obstructive pulmonary disease, unspecified: Secondary | ICD-10-CM | POA: Diagnosis not present

## 2023-08-28 DIAGNOSIS — I739 Peripheral vascular disease, unspecified: Secondary | ICD-10-CM | POA: Diagnosis not present

## 2023-08-28 DIAGNOSIS — I6529 Occlusion and stenosis of unspecified carotid artery: Secondary | ICD-10-CM | POA: Diagnosis not present

## 2023-08-28 DIAGNOSIS — E785 Hyperlipidemia, unspecified: Secondary | ICD-10-CM | POA: Diagnosis not present

## 2023-08-28 DIAGNOSIS — I10 Essential (primary) hypertension: Secondary | ICD-10-CM | POA: Diagnosis not present

## 2023-08-28 DIAGNOSIS — Z191 Hormone sensitive malignancy status: Secondary | ICD-10-CM | POA: Diagnosis not present

## 2023-08-28 DIAGNOSIS — I2510 Atherosclerotic heart disease of native coronary artery without angina pectoris: Secondary | ICD-10-CM | POA: Diagnosis not present

## 2023-08-28 DIAGNOSIS — K219 Gastro-esophageal reflux disease without esophagitis: Secondary | ICD-10-CM | POA: Diagnosis not present

## 2023-08-28 DIAGNOSIS — Z510 Encounter for antineoplastic radiation therapy: Secondary | ICD-10-CM | POA: Diagnosis not present

## 2023-08-28 LAB — CBC (CANCER CENTER ONLY)
HCT: 39 % (ref 39.0–52.0)
Hemoglobin: 13.3 g/dL (ref 13.0–17.0)
MCH: 30.4 pg (ref 26.0–34.0)
MCHC: 34.1 g/dL (ref 30.0–36.0)
MCV: 89.2 fL (ref 80.0–100.0)
Platelet Count: 125 10*3/uL — ABNORMAL LOW (ref 150–400)
RBC: 4.37 MIL/uL (ref 4.22–5.81)
RDW: 12.1 % (ref 11.5–15.5)
WBC Count: 4.8 10*3/uL (ref 4.0–10.5)
nRBC: 0 % (ref 0.0–0.2)

## 2023-08-28 LAB — RAD ONC ARIA SESSION SUMMARY
Course Elapsed Days: 6
Plan Fractions Treated to Date: 4
Plan Prescribed Dose Per Fraction: 2 Gy
Plan Total Fractions Prescribed: 40
Plan Total Prescribed Dose: 80 Gy
Reference Point Dosage Given to Date: 8 Gy
Reference Point Session Dosage Given: 2 Gy
Session Number: 4

## 2023-08-29 ENCOUNTER — Ambulatory Visit
Admission: RE | Admit: 2023-08-29 | Discharge: 2023-08-29 | Disposition: A | Discharge status: Home / Self Care | Source: Ambulatory Visit | Attending: Radiation Oncology | Admitting: Radiation Oncology

## 2023-08-29 ENCOUNTER — Other Ambulatory Visit: Payer: Self-pay

## 2023-08-29 DIAGNOSIS — C61 Malignant neoplasm of prostate: Secondary | ICD-10-CM | POA: Diagnosis not present

## 2023-08-29 DIAGNOSIS — I2510 Atherosclerotic heart disease of native coronary artery without angina pectoris: Secondary | ICD-10-CM | POA: Diagnosis not present

## 2023-08-29 DIAGNOSIS — J449 Chronic obstructive pulmonary disease, unspecified: Secondary | ICD-10-CM | POA: Diagnosis not present

## 2023-08-29 DIAGNOSIS — E785 Hyperlipidemia, unspecified: Secondary | ICD-10-CM | POA: Diagnosis not present

## 2023-08-29 DIAGNOSIS — Z87442 Personal history of urinary calculi: Secondary | ICD-10-CM | POA: Diagnosis not present

## 2023-08-29 DIAGNOSIS — I6529 Occlusion and stenosis of unspecified carotid artery: Secondary | ICD-10-CM | POA: Diagnosis not present

## 2023-08-29 DIAGNOSIS — Z510 Encounter for antineoplastic radiation therapy: Secondary | ICD-10-CM | POA: Diagnosis not present

## 2023-08-29 DIAGNOSIS — I10 Essential (primary) hypertension: Secondary | ICD-10-CM | POA: Diagnosis not present

## 2023-08-29 DIAGNOSIS — I739 Peripheral vascular disease, unspecified: Secondary | ICD-10-CM | POA: Diagnosis not present

## 2023-08-29 DIAGNOSIS — K219 Gastro-esophageal reflux disease without esophagitis: Secondary | ICD-10-CM | POA: Diagnosis not present

## 2023-08-29 DIAGNOSIS — Z191 Hormone sensitive malignancy status: Secondary | ICD-10-CM | POA: Diagnosis not present

## 2023-08-29 LAB — RAD ONC ARIA SESSION SUMMARY
Course Elapsed Days: 7
Plan Fractions Treated to Date: 5
Plan Prescribed Dose Per Fraction: 2 Gy
Plan Total Fractions Prescribed: 40
Plan Total Prescribed Dose: 80 Gy
Reference Point Dosage Given to Date: 10 Gy
Reference Point Session Dosage Given: 2 Gy
Session Number: 5

## 2023-08-30 ENCOUNTER — Other Ambulatory Visit: Payer: Self-pay

## 2023-08-30 ENCOUNTER — Ambulatory Visit
Admission: RE | Admit: 2023-08-30 | Discharge: 2023-08-30 | Disposition: A | Discharge status: Home / Self Care | Source: Ambulatory Visit | Attending: Radiation Oncology | Admitting: Radiation Oncology

## 2023-08-30 DIAGNOSIS — E785 Hyperlipidemia, unspecified: Secondary | ICD-10-CM | POA: Diagnosis not present

## 2023-08-30 DIAGNOSIS — Z87442 Personal history of urinary calculi: Secondary | ICD-10-CM | POA: Diagnosis not present

## 2023-08-30 DIAGNOSIS — I6529 Occlusion and stenosis of unspecified carotid artery: Secondary | ICD-10-CM | POA: Diagnosis not present

## 2023-08-30 DIAGNOSIS — Z191 Hormone sensitive malignancy status: Secondary | ICD-10-CM | POA: Diagnosis not present

## 2023-08-30 DIAGNOSIS — I2510 Atherosclerotic heart disease of native coronary artery without angina pectoris: Secondary | ICD-10-CM | POA: Diagnosis not present

## 2023-08-30 DIAGNOSIS — J449 Chronic obstructive pulmonary disease, unspecified: Secondary | ICD-10-CM | POA: Diagnosis not present

## 2023-08-30 DIAGNOSIS — C61 Malignant neoplasm of prostate: Secondary | ICD-10-CM | POA: Diagnosis not present

## 2023-08-30 DIAGNOSIS — Z510 Encounter for antineoplastic radiation therapy: Secondary | ICD-10-CM | POA: Diagnosis not present

## 2023-08-30 DIAGNOSIS — I10 Essential (primary) hypertension: Secondary | ICD-10-CM | POA: Diagnosis not present

## 2023-08-30 DIAGNOSIS — K219 Gastro-esophageal reflux disease without esophagitis: Secondary | ICD-10-CM | POA: Diagnosis not present

## 2023-08-30 DIAGNOSIS — I739 Peripheral vascular disease, unspecified: Secondary | ICD-10-CM | POA: Diagnosis not present

## 2023-08-30 LAB — RAD ONC ARIA SESSION SUMMARY
Course Elapsed Days: 8
Plan Fractions Treated to Date: 6
Plan Prescribed Dose Per Fraction: 2 Gy
Plan Total Fractions Prescribed: 40
Plan Total Prescribed Dose: 80 Gy
Reference Point Dosage Given to Date: 12 Gy
Reference Point Session Dosage Given: 2 Gy
Session Number: 6

## 2023-09-02 ENCOUNTER — Ambulatory Visit
Admission: RE | Admit: 2023-09-02 | Discharge: 2023-09-02 | Disposition: A | Discharge status: Home / Self Care | Source: Ambulatory Visit | Attending: Radiation Oncology | Admitting: Radiation Oncology

## 2023-09-02 ENCOUNTER — Other Ambulatory Visit: Payer: Self-pay

## 2023-09-02 DIAGNOSIS — Z87891 Personal history of nicotine dependence: Secondary | ICD-10-CM | POA: Diagnosis not present

## 2023-09-02 DIAGNOSIS — I6529 Occlusion and stenosis of unspecified carotid artery: Secondary | ICD-10-CM | POA: Insufficient documentation

## 2023-09-02 DIAGNOSIS — E785 Hyperlipidemia, unspecified: Secondary | ICD-10-CM | POA: Diagnosis not present

## 2023-09-02 DIAGNOSIS — J449 Chronic obstructive pulmonary disease, unspecified: Secondary | ICD-10-CM | POA: Insufficient documentation

## 2023-09-02 DIAGNOSIS — I2510 Atherosclerotic heart disease of native coronary artery without angina pectoris: Secondary | ICD-10-CM | POA: Diagnosis not present

## 2023-09-02 DIAGNOSIS — I739 Peripheral vascular disease, unspecified: Secondary | ICD-10-CM | POA: Diagnosis not present

## 2023-09-02 DIAGNOSIS — Z7982 Long term (current) use of aspirin: Secondary | ICD-10-CM | POA: Insufficient documentation

## 2023-09-02 DIAGNOSIS — Z85828 Personal history of other malignant neoplasm of skin: Secondary | ICD-10-CM | POA: Insufficient documentation

## 2023-09-02 DIAGNOSIS — Z79899 Other long term (current) drug therapy: Secondary | ICD-10-CM | POA: Diagnosis not present

## 2023-09-02 DIAGNOSIS — I10 Essential (primary) hypertension: Secondary | ICD-10-CM | POA: Insufficient documentation

## 2023-09-02 DIAGNOSIS — Z191 Hormone sensitive malignancy status: Secondary | ICD-10-CM | POA: Diagnosis not present

## 2023-09-02 DIAGNOSIS — K219 Gastro-esophageal reflux disease without esophagitis: Secondary | ICD-10-CM | POA: Diagnosis not present

## 2023-09-02 DIAGNOSIS — C61 Malignant neoplasm of prostate: Secondary | ICD-10-CM | POA: Insufficient documentation

## 2023-09-02 DIAGNOSIS — Z87442 Personal history of urinary calculi: Secondary | ICD-10-CM | POA: Insufficient documentation

## 2023-09-02 DIAGNOSIS — Z801 Family history of malignant neoplasm of trachea, bronchus and lung: Secondary | ICD-10-CM | POA: Diagnosis not present

## 2023-09-02 DIAGNOSIS — Z510 Encounter for antineoplastic radiation therapy: Secondary | ICD-10-CM | POA: Diagnosis not present

## 2023-09-02 LAB — RAD ONC ARIA SESSION SUMMARY
Course Elapsed Days: 11
Plan Fractions Treated to Date: 7
Plan Prescribed Dose Per Fraction: 2 Gy
Plan Total Fractions Prescribed: 40
Plan Total Prescribed Dose: 80 Gy
Reference Point Dosage Given to Date: 14 Gy
Reference Point Session Dosage Given: 2 Gy
Session Number: 7

## 2023-09-03 ENCOUNTER — Ambulatory Visit
Admission: RE | Admit: 2023-09-03 | Discharge: 2023-09-03 | Disposition: A | Discharge status: Home / Self Care | Source: Ambulatory Visit | Attending: Radiation Oncology | Admitting: Radiation Oncology

## 2023-09-03 ENCOUNTER — Other Ambulatory Visit: Payer: Self-pay

## 2023-09-03 DIAGNOSIS — I739 Peripheral vascular disease, unspecified: Secondary | ICD-10-CM | POA: Diagnosis not present

## 2023-09-03 DIAGNOSIS — Z510 Encounter for antineoplastic radiation therapy: Secondary | ICD-10-CM | POA: Diagnosis not present

## 2023-09-03 DIAGNOSIS — I10 Essential (primary) hypertension: Secondary | ICD-10-CM | POA: Diagnosis not present

## 2023-09-03 DIAGNOSIS — I2510 Atherosclerotic heart disease of native coronary artery without angina pectoris: Secondary | ICD-10-CM | POA: Diagnosis not present

## 2023-09-03 DIAGNOSIS — Z87442 Personal history of urinary calculi: Secondary | ICD-10-CM | POA: Diagnosis not present

## 2023-09-03 DIAGNOSIS — I6529 Occlusion and stenosis of unspecified carotid artery: Secondary | ICD-10-CM | POA: Diagnosis not present

## 2023-09-03 DIAGNOSIS — Z191 Hormone sensitive malignancy status: Secondary | ICD-10-CM | POA: Diagnosis not present

## 2023-09-03 DIAGNOSIS — K219 Gastro-esophageal reflux disease without esophagitis: Secondary | ICD-10-CM | POA: Diagnosis not present

## 2023-09-03 DIAGNOSIS — E785 Hyperlipidemia, unspecified: Secondary | ICD-10-CM | POA: Diagnosis not present

## 2023-09-03 DIAGNOSIS — C61 Malignant neoplasm of prostate: Secondary | ICD-10-CM | POA: Diagnosis not present

## 2023-09-03 DIAGNOSIS — J449 Chronic obstructive pulmonary disease, unspecified: Secondary | ICD-10-CM | POA: Diagnosis not present

## 2023-09-03 LAB — RAD ONC ARIA SESSION SUMMARY
Course Elapsed Days: 12
Plan Fractions Treated to Date: 8
Plan Prescribed Dose Per Fraction: 2 Gy
Plan Total Fractions Prescribed: 40
Plan Total Prescribed Dose: 80 Gy
Reference Point Dosage Given to Date: 16 Gy
Reference Point Session Dosage Given: 2 Gy
Session Number: 8

## 2023-09-04 ENCOUNTER — Other Ambulatory Visit: Payer: Self-pay

## 2023-09-04 ENCOUNTER — Ambulatory Visit
Admission: RE | Admit: 2023-09-04 | Discharge: 2023-09-04 | Disposition: A | Discharge status: Home / Self Care | Source: Ambulatory Visit | Attending: Radiation Oncology | Admitting: Radiation Oncology

## 2023-09-04 DIAGNOSIS — Z191 Hormone sensitive malignancy status: Secondary | ICD-10-CM | POA: Diagnosis not present

## 2023-09-04 DIAGNOSIS — E785 Hyperlipidemia, unspecified: Secondary | ICD-10-CM | POA: Diagnosis not present

## 2023-09-04 DIAGNOSIS — K219 Gastro-esophageal reflux disease without esophagitis: Secondary | ICD-10-CM | POA: Diagnosis not present

## 2023-09-04 DIAGNOSIS — I739 Peripheral vascular disease, unspecified: Secondary | ICD-10-CM | POA: Diagnosis not present

## 2023-09-04 DIAGNOSIS — I10 Essential (primary) hypertension: Secondary | ICD-10-CM | POA: Diagnosis not present

## 2023-09-04 DIAGNOSIS — I2510 Atherosclerotic heart disease of native coronary artery without angina pectoris: Secondary | ICD-10-CM | POA: Diagnosis not present

## 2023-09-04 DIAGNOSIS — C61 Malignant neoplasm of prostate: Secondary | ICD-10-CM | POA: Diagnosis not present

## 2023-09-04 DIAGNOSIS — I6529 Occlusion and stenosis of unspecified carotid artery: Secondary | ICD-10-CM | POA: Diagnosis not present

## 2023-09-04 DIAGNOSIS — Z87442 Personal history of urinary calculi: Secondary | ICD-10-CM | POA: Diagnosis not present

## 2023-09-04 DIAGNOSIS — Z510 Encounter for antineoplastic radiation therapy: Secondary | ICD-10-CM | POA: Diagnosis not present

## 2023-09-04 DIAGNOSIS — J449 Chronic obstructive pulmonary disease, unspecified: Secondary | ICD-10-CM | POA: Diagnosis not present

## 2023-09-04 LAB — RAD ONC ARIA SESSION SUMMARY
Course Elapsed Days: 13
Plan Fractions Treated to Date: 9
Plan Prescribed Dose Per Fraction: 2 Gy
Plan Total Fractions Prescribed: 40
Plan Total Prescribed Dose: 80 Gy
Reference Point Dosage Given to Date: 18 Gy
Reference Point Session Dosage Given: 2 Gy
Session Number: 9

## 2023-09-05 ENCOUNTER — Other Ambulatory Visit: Payer: Self-pay

## 2023-09-05 ENCOUNTER — Ambulatory Visit
Admission: RE | Admit: 2023-09-05 | Discharge: 2023-09-05 | Disposition: A | Discharge status: Home / Self Care | Source: Ambulatory Visit | Attending: Radiation Oncology | Admitting: Radiation Oncology

## 2023-09-05 DIAGNOSIS — K219 Gastro-esophageal reflux disease without esophagitis: Secondary | ICD-10-CM | POA: Diagnosis not present

## 2023-09-05 DIAGNOSIS — C61 Malignant neoplasm of prostate: Secondary | ICD-10-CM | POA: Diagnosis not present

## 2023-09-05 DIAGNOSIS — I10 Essential (primary) hypertension: Secondary | ICD-10-CM | POA: Diagnosis not present

## 2023-09-05 DIAGNOSIS — I6529 Occlusion and stenosis of unspecified carotid artery: Secondary | ICD-10-CM | POA: Diagnosis not present

## 2023-09-05 DIAGNOSIS — I2510 Atherosclerotic heart disease of native coronary artery without angina pectoris: Secondary | ICD-10-CM | POA: Diagnosis not present

## 2023-09-05 DIAGNOSIS — Z510 Encounter for antineoplastic radiation therapy: Secondary | ICD-10-CM | POA: Diagnosis not present

## 2023-09-05 DIAGNOSIS — J449 Chronic obstructive pulmonary disease, unspecified: Secondary | ICD-10-CM | POA: Diagnosis not present

## 2023-09-05 DIAGNOSIS — I739 Peripheral vascular disease, unspecified: Secondary | ICD-10-CM | POA: Diagnosis not present

## 2023-09-05 DIAGNOSIS — Z87442 Personal history of urinary calculi: Secondary | ICD-10-CM | POA: Diagnosis not present

## 2023-09-05 DIAGNOSIS — Z191 Hormone sensitive malignancy status: Secondary | ICD-10-CM | POA: Diagnosis not present

## 2023-09-05 DIAGNOSIS — E785 Hyperlipidemia, unspecified: Secondary | ICD-10-CM | POA: Diagnosis not present

## 2023-09-05 LAB — RAD ONC ARIA SESSION SUMMARY
Course Elapsed Days: 14
Plan Fractions Treated to Date: 10
Plan Prescribed Dose Per Fraction: 2 Gy
Plan Total Fractions Prescribed: 40
Plan Total Prescribed Dose: 80 Gy
Reference Point Dosage Given to Date: 20 Gy
Reference Point Session Dosage Given: 2 Gy
Session Number: 10

## 2023-09-06 ENCOUNTER — Other Ambulatory Visit: Payer: Self-pay

## 2023-09-06 ENCOUNTER — Ambulatory Visit
Admission: RE | Admit: 2023-09-06 | Discharge: 2023-09-06 | Disposition: A | Discharge status: Home / Self Care | Source: Ambulatory Visit | Attending: Radiation Oncology | Admitting: Radiation Oncology

## 2023-09-06 DIAGNOSIS — E785 Hyperlipidemia, unspecified: Secondary | ICD-10-CM | POA: Diagnosis not present

## 2023-09-06 DIAGNOSIS — I10 Essential (primary) hypertension: Secondary | ICD-10-CM | POA: Diagnosis not present

## 2023-09-06 DIAGNOSIS — C61 Malignant neoplasm of prostate: Secondary | ICD-10-CM | POA: Diagnosis not present

## 2023-09-06 DIAGNOSIS — I2510 Atherosclerotic heart disease of native coronary artery without angina pectoris: Secondary | ICD-10-CM | POA: Diagnosis not present

## 2023-09-06 DIAGNOSIS — J449 Chronic obstructive pulmonary disease, unspecified: Secondary | ICD-10-CM | POA: Diagnosis not present

## 2023-09-06 DIAGNOSIS — Z191 Hormone sensitive malignancy status: Secondary | ICD-10-CM | POA: Diagnosis not present

## 2023-09-06 DIAGNOSIS — Z510 Encounter for antineoplastic radiation therapy: Secondary | ICD-10-CM | POA: Diagnosis not present

## 2023-09-06 DIAGNOSIS — I739 Peripheral vascular disease, unspecified: Secondary | ICD-10-CM | POA: Diagnosis not present

## 2023-09-06 DIAGNOSIS — I6529 Occlusion and stenosis of unspecified carotid artery: Secondary | ICD-10-CM | POA: Diagnosis not present

## 2023-09-06 DIAGNOSIS — K219 Gastro-esophageal reflux disease without esophagitis: Secondary | ICD-10-CM | POA: Diagnosis not present

## 2023-09-06 DIAGNOSIS — Z87442 Personal history of urinary calculi: Secondary | ICD-10-CM | POA: Diagnosis not present

## 2023-09-06 LAB — RAD ONC ARIA SESSION SUMMARY
Course Elapsed Days: 15
Plan Fractions Treated to Date: 11
Plan Prescribed Dose Per Fraction: 2 Gy
Plan Total Fractions Prescribed: 40
Plan Total Prescribed Dose: 80 Gy
Reference Point Dosage Given to Date: 22 Gy
Reference Point Session Dosage Given: 2 Gy
Session Number: 11

## 2023-09-09 ENCOUNTER — Ambulatory Visit
Admission: RE | Admit: 2023-09-09 | Discharge: 2023-09-09 | Disposition: A | Discharge status: Home / Self Care | Source: Ambulatory Visit | Attending: Radiation Oncology | Admitting: Radiation Oncology

## 2023-09-09 ENCOUNTER — Other Ambulatory Visit: Payer: Self-pay

## 2023-09-09 DIAGNOSIS — I2510 Atherosclerotic heart disease of native coronary artery without angina pectoris: Secondary | ICD-10-CM | POA: Diagnosis not present

## 2023-09-09 DIAGNOSIS — I6529 Occlusion and stenosis of unspecified carotid artery: Secondary | ICD-10-CM | POA: Diagnosis not present

## 2023-09-09 DIAGNOSIS — E785 Hyperlipidemia, unspecified: Secondary | ICD-10-CM | POA: Diagnosis not present

## 2023-09-09 DIAGNOSIS — I10 Essential (primary) hypertension: Secondary | ICD-10-CM | POA: Diagnosis not present

## 2023-09-09 DIAGNOSIS — C61 Malignant neoplasm of prostate: Secondary | ICD-10-CM | POA: Diagnosis not present

## 2023-09-09 DIAGNOSIS — J449 Chronic obstructive pulmonary disease, unspecified: Secondary | ICD-10-CM | POA: Diagnosis not present

## 2023-09-09 DIAGNOSIS — I739 Peripheral vascular disease, unspecified: Secondary | ICD-10-CM | POA: Diagnosis not present

## 2023-09-09 DIAGNOSIS — Z87442 Personal history of urinary calculi: Secondary | ICD-10-CM | POA: Diagnosis not present

## 2023-09-09 DIAGNOSIS — K219 Gastro-esophageal reflux disease without esophagitis: Secondary | ICD-10-CM | POA: Diagnosis not present

## 2023-09-09 LAB — RAD ONC ARIA SESSION SUMMARY
Course Elapsed Days: 18
Plan Fractions Treated to Date: 12
Plan Prescribed Dose Per Fraction: 2 Gy
Plan Total Fractions Prescribed: 40
Plan Total Prescribed Dose: 80 Gy
Reference Point Dosage Given to Date: 24 Gy
Reference Point Session Dosage Given: 2 Gy
Session Number: 12

## 2023-09-10 ENCOUNTER — Ambulatory Visit
Admission: RE | Admit: 2023-09-10 | Discharge: 2023-09-10 | Disposition: A | Discharge status: Home / Self Care | Source: Ambulatory Visit | Attending: Radiation Oncology | Admitting: Radiation Oncology

## 2023-09-10 ENCOUNTER — Other Ambulatory Visit: Payer: Self-pay

## 2023-09-10 DIAGNOSIS — E785 Hyperlipidemia, unspecified: Secondary | ICD-10-CM | POA: Diagnosis not present

## 2023-09-10 DIAGNOSIS — I2510 Atherosclerotic heart disease of native coronary artery without angina pectoris: Secondary | ICD-10-CM | POA: Diagnosis not present

## 2023-09-10 DIAGNOSIS — I6529 Occlusion and stenosis of unspecified carotid artery: Secondary | ICD-10-CM | POA: Diagnosis not present

## 2023-09-10 DIAGNOSIS — Z191 Hormone sensitive malignancy status: Secondary | ICD-10-CM | POA: Diagnosis not present

## 2023-09-10 DIAGNOSIS — C61 Malignant neoplasm of prostate: Secondary | ICD-10-CM | POA: Diagnosis not present

## 2023-09-10 DIAGNOSIS — J449 Chronic obstructive pulmonary disease, unspecified: Secondary | ICD-10-CM | POA: Diagnosis not present

## 2023-09-10 DIAGNOSIS — I739 Peripheral vascular disease, unspecified: Secondary | ICD-10-CM | POA: Diagnosis not present

## 2023-09-10 DIAGNOSIS — Z87442 Personal history of urinary calculi: Secondary | ICD-10-CM | POA: Diagnosis not present

## 2023-09-10 DIAGNOSIS — I10 Essential (primary) hypertension: Secondary | ICD-10-CM | POA: Diagnosis not present

## 2023-09-10 DIAGNOSIS — K219 Gastro-esophageal reflux disease without esophagitis: Secondary | ICD-10-CM | POA: Diagnosis not present

## 2023-09-10 DIAGNOSIS — Z510 Encounter for antineoplastic radiation therapy: Secondary | ICD-10-CM | POA: Diagnosis not present

## 2023-09-10 LAB — RAD ONC ARIA SESSION SUMMARY
Course Elapsed Days: 19
Plan Fractions Treated to Date: 13
Plan Prescribed Dose Per Fraction: 2 Gy
Plan Total Fractions Prescribed: 40
Plan Total Prescribed Dose: 80 Gy
Reference Point Dosage Given to Date: 26 Gy
Reference Point Session Dosage Given: 2 Gy
Session Number: 13

## 2023-09-11 ENCOUNTER — Ambulatory Visit
Admission: RE | Admit: 2023-09-11 | Discharge: 2023-09-11 | Disposition: A | Discharge status: Home / Self Care | Source: Ambulatory Visit | Attending: Radiation Oncology | Admitting: Radiation Oncology

## 2023-09-11 ENCOUNTER — Inpatient Hospital Stay

## 2023-09-11 ENCOUNTER — Other Ambulatory Visit: Payer: Self-pay

## 2023-09-11 DIAGNOSIS — D696 Thrombocytopenia, unspecified: Secondary | ICD-10-CM | POA: Insufficient documentation

## 2023-09-11 DIAGNOSIS — I6529 Occlusion and stenosis of unspecified carotid artery: Secondary | ICD-10-CM | POA: Diagnosis not present

## 2023-09-11 DIAGNOSIS — C61 Malignant neoplasm of prostate: Secondary | ICD-10-CM

## 2023-09-11 DIAGNOSIS — Z510 Encounter for antineoplastic radiation therapy: Secondary | ICD-10-CM | POA: Diagnosis not present

## 2023-09-11 DIAGNOSIS — I739 Peripheral vascular disease, unspecified: Secondary | ICD-10-CM | POA: Diagnosis not present

## 2023-09-11 DIAGNOSIS — I10 Essential (primary) hypertension: Secondary | ICD-10-CM | POA: Diagnosis not present

## 2023-09-11 DIAGNOSIS — D509 Iron deficiency anemia, unspecified: Secondary | ICD-10-CM | POA: Insufficient documentation

## 2023-09-11 DIAGNOSIS — I2510 Atherosclerotic heart disease of native coronary artery without angina pectoris: Secondary | ICD-10-CM | POA: Diagnosis not present

## 2023-09-11 DIAGNOSIS — K219 Gastro-esophageal reflux disease without esophagitis: Secondary | ICD-10-CM | POA: Diagnosis not present

## 2023-09-11 DIAGNOSIS — J449 Chronic obstructive pulmonary disease, unspecified: Secondary | ICD-10-CM | POA: Diagnosis not present

## 2023-09-11 DIAGNOSIS — E785 Hyperlipidemia, unspecified: Secondary | ICD-10-CM | POA: Diagnosis not present

## 2023-09-11 DIAGNOSIS — Z191 Hormone sensitive malignancy status: Secondary | ICD-10-CM | POA: Diagnosis not present

## 2023-09-11 DIAGNOSIS — Z87442 Personal history of urinary calculi: Secondary | ICD-10-CM | POA: Diagnosis not present

## 2023-09-11 LAB — RAD ONC ARIA SESSION SUMMARY
Course Elapsed Days: 20
Plan Fractions Treated to Date: 14
Plan Prescribed Dose Per Fraction: 2 Gy
Plan Total Fractions Prescribed: 40
Plan Total Prescribed Dose: 80 Gy
Reference Point Dosage Given to Date: 28 Gy
Reference Point Session Dosage Given: 2 Gy
Session Number: 14

## 2023-09-11 LAB — CBC (CANCER CENTER ONLY)
HCT: 37.8 % — ABNORMAL LOW (ref 39.0–52.0)
Hemoglobin: 12.6 g/dL — ABNORMAL LOW (ref 13.0–17.0)
MCH: 29.9 pg (ref 26.0–34.0)
MCHC: 33.3 g/dL (ref 30.0–36.0)
MCV: 89.8 fL (ref 80.0–100.0)
Platelet Count: 139 10*3/uL — ABNORMAL LOW (ref 150–400)
RBC: 4.21 MIL/uL — ABNORMAL LOW (ref 4.22–5.81)
RDW: 12.2 % (ref 11.5–15.5)
WBC Count: 5 10*3/uL (ref 4.0–10.5)
nRBC: 0 % (ref 0.0–0.2)

## 2023-09-12 ENCOUNTER — Ambulatory Visit
Admission: RE | Admit: 2023-09-12 | Discharge: 2023-09-12 | Disposition: A | Discharge status: Home / Self Care | Source: Ambulatory Visit | Attending: Radiation Oncology | Admitting: Radiation Oncology

## 2023-09-12 ENCOUNTER — Other Ambulatory Visit: Payer: Self-pay

## 2023-09-12 DIAGNOSIS — I739 Peripheral vascular disease, unspecified: Secondary | ICD-10-CM | POA: Diagnosis not present

## 2023-09-12 DIAGNOSIS — C61 Malignant neoplasm of prostate: Secondary | ICD-10-CM | POA: Diagnosis not present

## 2023-09-12 DIAGNOSIS — Z87442 Personal history of urinary calculi: Secondary | ICD-10-CM | POA: Diagnosis not present

## 2023-09-12 DIAGNOSIS — Z191 Hormone sensitive malignancy status: Secondary | ICD-10-CM | POA: Diagnosis not present

## 2023-09-12 DIAGNOSIS — J449 Chronic obstructive pulmonary disease, unspecified: Secondary | ICD-10-CM | POA: Diagnosis not present

## 2023-09-12 DIAGNOSIS — I6529 Occlusion and stenosis of unspecified carotid artery: Secondary | ICD-10-CM | POA: Diagnosis not present

## 2023-09-12 DIAGNOSIS — I2510 Atherosclerotic heart disease of native coronary artery without angina pectoris: Secondary | ICD-10-CM | POA: Diagnosis not present

## 2023-09-12 DIAGNOSIS — Z510 Encounter for antineoplastic radiation therapy: Secondary | ICD-10-CM | POA: Diagnosis not present

## 2023-09-12 DIAGNOSIS — E785 Hyperlipidemia, unspecified: Secondary | ICD-10-CM | POA: Diagnosis not present

## 2023-09-12 DIAGNOSIS — I10 Essential (primary) hypertension: Secondary | ICD-10-CM | POA: Diagnosis not present

## 2023-09-12 DIAGNOSIS — K219 Gastro-esophageal reflux disease without esophagitis: Secondary | ICD-10-CM | POA: Diagnosis not present

## 2023-09-12 LAB — RAD ONC ARIA SESSION SUMMARY
Course Elapsed Days: 21
Plan Fractions Treated to Date: 15
Plan Prescribed Dose Per Fraction: 2 Gy
Plan Total Fractions Prescribed: 40
Plan Total Prescribed Dose: 80 Gy
Reference Point Dosage Given to Date: 30 Gy
Reference Point Session Dosage Given: 2 Gy
Session Number: 15

## 2023-09-13 ENCOUNTER — Ambulatory Visit
Admission: RE | Admit: 2023-09-13 | Discharge: 2023-09-13 | Disposition: A | Discharge status: Home / Self Care | Source: Ambulatory Visit | Attending: Radiation Oncology | Admitting: Radiation Oncology

## 2023-09-13 ENCOUNTER — Other Ambulatory Visit: Payer: Self-pay

## 2023-09-13 DIAGNOSIS — I2510 Atherosclerotic heart disease of native coronary artery without angina pectoris: Secondary | ICD-10-CM | POA: Diagnosis not present

## 2023-09-13 DIAGNOSIS — K219 Gastro-esophageal reflux disease without esophagitis: Secondary | ICD-10-CM | POA: Diagnosis not present

## 2023-09-13 DIAGNOSIS — Z510 Encounter for antineoplastic radiation therapy: Secondary | ICD-10-CM | POA: Diagnosis not present

## 2023-09-13 DIAGNOSIS — I10 Essential (primary) hypertension: Secondary | ICD-10-CM | POA: Diagnosis not present

## 2023-09-13 DIAGNOSIS — Z87442 Personal history of urinary calculi: Secondary | ICD-10-CM | POA: Diagnosis not present

## 2023-09-13 DIAGNOSIS — I739 Peripheral vascular disease, unspecified: Secondary | ICD-10-CM | POA: Diagnosis not present

## 2023-09-13 DIAGNOSIS — Z191 Hormone sensitive malignancy status: Secondary | ICD-10-CM | POA: Diagnosis not present

## 2023-09-13 DIAGNOSIS — J449 Chronic obstructive pulmonary disease, unspecified: Secondary | ICD-10-CM | POA: Diagnosis not present

## 2023-09-13 DIAGNOSIS — E785 Hyperlipidemia, unspecified: Secondary | ICD-10-CM | POA: Diagnosis not present

## 2023-09-13 DIAGNOSIS — I6529 Occlusion and stenosis of unspecified carotid artery: Secondary | ICD-10-CM | POA: Diagnosis not present

## 2023-09-13 DIAGNOSIS — C61 Malignant neoplasm of prostate: Secondary | ICD-10-CM | POA: Diagnosis not present

## 2023-09-13 LAB — RAD ONC ARIA SESSION SUMMARY
Course Elapsed Days: 22
Plan Fractions Treated to Date: 16
Plan Prescribed Dose Per Fraction: 2 Gy
Plan Total Fractions Prescribed: 40
Plan Total Prescribed Dose: 80 Gy
Reference Point Dosage Given to Date: 32 Gy
Reference Point Session Dosage Given: 2 Gy
Session Number: 16

## 2023-09-16 ENCOUNTER — Other Ambulatory Visit: Payer: Self-pay

## 2023-09-16 ENCOUNTER — Ambulatory Visit
Admission: RE | Admit: 2023-09-16 | Discharge: 2023-09-16 | Disposition: A | Discharge status: Home / Self Care | Source: Ambulatory Visit | Attending: Radiation Oncology | Admitting: Radiation Oncology

## 2023-09-16 DIAGNOSIS — I6529 Occlusion and stenosis of unspecified carotid artery: Secondary | ICD-10-CM | POA: Diagnosis not present

## 2023-09-16 DIAGNOSIS — I10 Essential (primary) hypertension: Secondary | ICD-10-CM | POA: Diagnosis not present

## 2023-09-16 DIAGNOSIS — K219 Gastro-esophageal reflux disease without esophagitis: Secondary | ICD-10-CM | POA: Diagnosis not present

## 2023-09-16 DIAGNOSIS — I739 Peripheral vascular disease, unspecified: Secondary | ICD-10-CM | POA: Diagnosis not present

## 2023-09-16 DIAGNOSIS — I2510 Atherosclerotic heart disease of native coronary artery without angina pectoris: Secondary | ICD-10-CM | POA: Diagnosis not present

## 2023-09-16 DIAGNOSIS — Z87442 Personal history of urinary calculi: Secondary | ICD-10-CM | POA: Diagnosis not present

## 2023-09-16 DIAGNOSIS — J449 Chronic obstructive pulmonary disease, unspecified: Secondary | ICD-10-CM | POA: Diagnosis not present

## 2023-09-16 DIAGNOSIS — C61 Malignant neoplasm of prostate: Secondary | ICD-10-CM | POA: Diagnosis not present

## 2023-09-16 DIAGNOSIS — E785 Hyperlipidemia, unspecified: Secondary | ICD-10-CM | POA: Diagnosis not present

## 2023-09-16 LAB — RAD ONC ARIA SESSION SUMMARY
Course Elapsed Days: 25
Plan Fractions Treated to Date: 17
Plan Prescribed Dose Per Fraction: 2 Gy
Plan Total Fractions Prescribed: 40
Plan Total Prescribed Dose: 80 Gy
Reference Point Dosage Given to Date: 34 Gy
Reference Point Session Dosage Given: 2 Gy
Session Number: 17

## 2023-09-17 ENCOUNTER — Other Ambulatory Visit: Payer: Self-pay

## 2023-09-17 ENCOUNTER — Ambulatory Visit
Admission: RE | Admit: 2023-09-17 | Discharge: 2023-09-17 | Disposition: A | Discharge status: Home / Self Care | Source: Ambulatory Visit | Attending: Radiation Oncology | Admitting: Radiation Oncology

## 2023-09-17 DIAGNOSIS — Z510 Encounter for antineoplastic radiation therapy: Secondary | ICD-10-CM | POA: Diagnosis not present

## 2023-09-17 DIAGNOSIS — I739 Peripheral vascular disease, unspecified: Secondary | ICD-10-CM | POA: Diagnosis not present

## 2023-09-17 DIAGNOSIS — J449 Chronic obstructive pulmonary disease, unspecified: Secondary | ICD-10-CM | POA: Diagnosis not present

## 2023-09-17 DIAGNOSIS — Z87442 Personal history of urinary calculi: Secondary | ICD-10-CM | POA: Diagnosis not present

## 2023-09-17 DIAGNOSIS — Z191 Hormone sensitive malignancy status: Secondary | ICD-10-CM | POA: Diagnosis not present

## 2023-09-17 DIAGNOSIS — I6529 Occlusion and stenosis of unspecified carotid artery: Secondary | ICD-10-CM | POA: Diagnosis not present

## 2023-09-17 DIAGNOSIS — C61 Malignant neoplasm of prostate: Secondary | ICD-10-CM | POA: Diagnosis not present

## 2023-09-17 DIAGNOSIS — I2510 Atherosclerotic heart disease of native coronary artery without angina pectoris: Secondary | ICD-10-CM | POA: Diagnosis not present

## 2023-09-17 DIAGNOSIS — E785 Hyperlipidemia, unspecified: Secondary | ICD-10-CM | POA: Diagnosis not present

## 2023-09-17 DIAGNOSIS — I10 Essential (primary) hypertension: Secondary | ICD-10-CM | POA: Diagnosis not present

## 2023-09-17 DIAGNOSIS — K219 Gastro-esophageal reflux disease without esophagitis: Secondary | ICD-10-CM | POA: Diagnosis not present

## 2023-09-17 LAB — RAD ONC ARIA SESSION SUMMARY
Course Elapsed Days: 26
Plan Fractions Treated to Date: 18
Plan Prescribed Dose Per Fraction: 2 Gy
Plan Total Fractions Prescribed: 40
Plan Total Prescribed Dose: 80 Gy
Reference Point Dosage Given to Date: 36 Gy
Reference Point Session Dosage Given: 2 Gy
Session Number: 18

## 2023-09-18 ENCOUNTER — Other Ambulatory Visit: Payer: Self-pay

## 2023-09-18 ENCOUNTER — Ambulatory Visit
Admission: RE | Admit: 2023-09-18 | Discharge: 2023-09-18 | Disposition: A | Discharge status: Home / Self Care | Source: Ambulatory Visit | Attending: Radiation Oncology | Admitting: Radiation Oncology

## 2023-09-18 DIAGNOSIS — Z510 Encounter for antineoplastic radiation therapy: Secondary | ICD-10-CM | POA: Diagnosis not present

## 2023-09-18 DIAGNOSIS — H40013 Open angle with borderline findings, low risk, bilateral: Secondary | ICD-10-CM | POA: Diagnosis not present

## 2023-09-18 DIAGNOSIS — G453 Amaurosis fugax: Secondary | ICD-10-CM | POA: Diagnosis not present

## 2023-09-18 DIAGNOSIS — H02836 Dermatochalasis of left eye, unspecified eyelid: Secondary | ICD-10-CM | POA: Diagnosis not present

## 2023-09-18 DIAGNOSIS — H34212 Partial retinal artery occlusion, left eye: Secondary | ICD-10-CM | POA: Diagnosis not present

## 2023-09-18 DIAGNOSIS — H34232 Retinal artery branch occlusion, left eye: Secondary | ICD-10-CM | POA: Diagnosis not present

## 2023-09-18 DIAGNOSIS — E785 Hyperlipidemia, unspecified: Secondary | ICD-10-CM | POA: Diagnosis not present

## 2023-09-18 DIAGNOSIS — Z87442 Personal history of urinary calculi: Secondary | ICD-10-CM | POA: Diagnosis not present

## 2023-09-18 DIAGNOSIS — I2510 Atherosclerotic heart disease of native coronary artery without angina pectoris: Secondary | ICD-10-CM | POA: Diagnosis not present

## 2023-09-18 DIAGNOSIS — H4311 Vitreous hemorrhage, right eye: Secondary | ICD-10-CM | POA: Diagnosis not present

## 2023-09-18 DIAGNOSIS — K219 Gastro-esophageal reflux disease without esophagitis: Secondary | ICD-10-CM | POA: Diagnosis not present

## 2023-09-18 DIAGNOSIS — J449 Chronic obstructive pulmonary disease, unspecified: Secondary | ICD-10-CM | POA: Diagnosis not present

## 2023-09-18 DIAGNOSIS — H43813 Vitreous degeneration, bilateral: Secondary | ICD-10-CM | POA: Diagnosis not present

## 2023-09-18 DIAGNOSIS — I739 Peripheral vascular disease, unspecified: Secondary | ICD-10-CM | POA: Diagnosis not present

## 2023-09-18 DIAGNOSIS — C61 Malignant neoplasm of prostate: Secondary | ICD-10-CM | POA: Diagnosis not present

## 2023-09-18 DIAGNOSIS — I10 Essential (primary) hypertension: Secondary | ICD-10-CM | POA: Diagnosis not present

## 2023-09-18 DIAGNOSIS — H02833 Dermatochalasis of right eye, unspecified eyelid: Secondary | ICD-10-CM | POA: Diagnosis not present

## 2023-09-18 DIAGNOSIS — I6529 Occlusion and stenosis of unspecified carotid artery: Secondary | ICD-10-CM | POA: Diagnosis not present

## 2023-09-18 DIAGNOSIS — Z191 Hormone sensitive malignancy status: Secondary | ICD-10-CM | POA: Diagnosis not present

## 2023-09-18 LAB — RAD ONC ARIA SESSION SUMMARY
Course Elapsed Days: 27
Plan Fractions Treated to Date: 19
Plan Prescribed Dose Per Fraction: 2 Gy
Plan Total Fractions Prescribed: 40
Plan Total Prescribed Dose: 80 Gy
Reference Point Dosage Given to Date: 38 Gy
Reference Point Session Dosage Given: 2 Gy
Session Number: 19

## 2023-09-19 ENCOUNTER — Other Ambulatory Visit: Payer: Self-pay

## 2023-09-19 ENCOUNTER — Ambulatory Visit
Admission: RE | Admit: 2023-09-19 | Discharge: 2023-09-19 | Disposition: A | Discharge status: Home / Self Care | Source: Ambulatory Visit | Attending: Radiation Oncology | Admitting: Radiation Oncology

## 2023-09-19 DIAGNOSIS — Z510 Encounter for antineoplastic radiation therapy: Secondary | ICD-10-CM | POA: Diagnosis not present

## 2023-09-19 DIAGNOSIS — C61 Malignant neoplasm of prostate: Secondary | ICD-10-CM | POA: Diagnosis not present

## 2023-09-19 DIAGNOSIS — Z87442 Personal history of urinary calculi: Secondary | ICD-10-CM | POA: Diagnosis not present

## 2023-09-19 DIAGNOSIS — E785 Hyperlipidemia, unspecified: Secondary | ICD-10-CM | POA: Diagnosis not present

## 2023-09-19 DIAGNOSIS — I6529 Occlusion and stenosis of unspecified carotid artery: Secondary | ICD-10-CM | POA: Diagnosis not present

## 2023-09-19 DIAGNOSIS — I2510 Atherosclerotic heart disease of native coronary artery without angina pectoris: Secondary | ICD-10-CM | POA: Diagnosis not present

## 2023-09-19 DIAGNOSIS — Z191 Hormone sensitive malignancy status: Secondary | ICD-10-CM | POA: Diagnosis not present

## 2023-09-19 DIAGNOSIS — J449 Chronic obstructive pulmonary disease, unspecified: Secondary | ICD-10-CM | POA: Diagnosis not present

## 2023-09-19 DIAGNOSIS — I10 Essential (primary) hypertension: Secondary | ICD-10-CM | POA: Diagnosis not present

## 2023-09-19 DIAGNOSIS — I739 Peripheral vascular disease, unspecified: Secondary | ICD-10-CM | POA: Diagnosis not present

## 2023-09-19 DIAGNOSIS — K219 Gastro-esophageal reflux disease without esophagitis: Secondary | ICD-10-CM | POA: Diagnosis not present

## 2023-09-19 LAB — RAD ONC ARIA SESSION SUMMARY
Course Elapsed Days: 28
Plan Fractions Treated to Date: 20
Plan Prescribed Dose Per Fraction: 2 Gy
Plan Total Fractions Prescribed: 40
Plan Total Prescribed Dose: 80 Gy
Reference Point Dosage Given to Date: 40 Gy
Reference Point Session Dosage Given: 2 Gy
Session Number: 20

## 2023-09-20 ENCOUNTER — Ambulatory Visit
Admission: RE | Admit: 2023-09-20 | Discharge: 2023-09-20 | Disposition: A | Discharge status: Home / Self Care | Source: Ambulatory Visit | Attending: Radiation Oncology | Admitting: Radiation Oncology

## 2023-09-20 ENCOUNTER — Other Ambulatory Visit: Payer: Self-pay

## 2023-09-20 DIAGNOSIS — C61 Malignant neoplasm of prostate: Secondary | ICD-10-CM | POA: Diagnosis not present

## 2023-09-20 DIAGNOSIS — K219 Gastro-esophageal reflux disease without esophagitis: Secondary | ICD-10-CM | POA: Diagnosis not present

## 2023-09-20 DIAGNOSIS — I10 Essential (primary) hypertension: Secondary | ICD-10-CM | POA: Diagnosis not present

## 2023-09-20 DIAGNOSIS — Z191 Hormone sensitive malignancy status: Secondary | ICD-10-CM | POA: Diagnosis not present

## 2023-09-20 DIAGNOSIS — E785 Hyperlipidemia, unspecified: Secondary | ICD-10-CM | POA: Diagnosis not present

## 2023-09-20 DIAGNOSIS — I739 Peripheral vascular disease, unspecified: Secondary | ICD-10-CM | POA: Diagnosis not present

## 2023-09-20 DIAGNOSIS — I6529 Occlusion and stenosis of unspecified carotid artery: Secondary | ICD-10-CM | POA: Diagnosis not present

## 2023-09-20 DIAGNOSIS — Z510 Encounter for antineoplastic radiation therapy: Secondary | ICD-10-CM | POA: Diagnosis not present

## 2023-09-20 DIAGNOSIS — J449 Chronic obstructive pulmonary disease, unspecified: Secondary | ICD-10-CM | POA: Diagnosis not present

## 2023-09-20 DIAGNOSIS — I2510 Atherosclerotic heart disease of native coronary artery without angina pectoris: Secondary | ICD-10-CM | POA: Diagnosis not present

## 2023-09-20 DIAGNOSIS — Z87442 Personal history of urinary calculi: Secondary | ICD-10-CM | POA: Diagnosis not present

## 2023-09-20 LAB — RAD ONC ARIA SESSION SUMMARY
Course Elapsed Days: 29
Plan Fractions Treated to Date: 21
Plan Prescribed Dose Per Fraction: 2 Gy
Plan Total Fractions Prescribed: 40
Plan Total Prescribed Dose: 80 Gy
Reference Point Dosage Given to Date: 42 Gy
Reference Point Session Dosage Given: 2 Gy
Session Number: 21

## 2023-09-21 ENCOUNTER — Other Ambulatory Visit: Payer: Self-pay | Admitting: Primary Care

## 2023-09-21 DIAGNOSIS — E782 Mixed hyperlipidemia: Secondary | ICD-10-CM

## 2023-09-23 ENCOUNTER — Ambulatory Visit
Admission: RE | Admit: 2023-09-23 | Discharge: 2023-09-23 | Disposition: A | Discharge status: Home / Self Care | Source: Ambulatory Visit | Attending: Radiation Oncology | Admitting: Radiation Oncology

## 2023-09-23 ENCOUNTER — Other Ambulatory Visit: Payer: Self-pay

## 2023-09-23 ENCOUNTER — Other Ambulatory Visit (INDEPENDENT_AMBULATORY_CARE_PROVIDER_SITE_OTHER): Payer: Self-pay | Admitting: Vascular Surgery

## 2023-09-23 DIAGNOSIS — I10 Essential (primary) hypertension: Secondary | ICD-10-CM | POA: Diagnosis not present

## 2023-09-23 DIAGNOSIS — I6529 Occlusion and stenosis of unspecified carotid artery: Secondary | ICD-10-CM | POA: Diagnosis not present

## 2023-09-23 DIAGNOSIS — E785 Hyperlipidemia, unspecified: Secondary | ICD-10-CM | POA: Diagnosis not present

## 2023-09-23 DIAGNOSIS — J449 Chronic obstructive pulmonary disease, unspecified: Secondary | ICD-10-CM | POA: Diagnosis not present

## 2023-09-23 DIAGNOSIS — I739 Peripheral vascular disease, unspecified: Secondary | ICD-10-CM | POA: Diagnosis not present

## 2023-09-23 DIAGNOSIS — I2510 Atherosclerotic heart disease of native coronary artery without angina pectoris: Secondary | ICD-10-CM | POA: Diagnosis not present

## 2023-09-23 DIAGNOSIS — K219 Gastro-esophageal reflux disease without esophagitis: Secondary | ICD-10-CM | POA: Diagnosis not present

## 2023-09-23 DIAGNOSIS — C61 Malignant neoplasm of prostate: Secondary | ICD-10-CM | POA: Diagnosis not present

## 2023-09-23 DIAGNOSIS — I6523 Occlusion and stenosis of bilateral carotid arteries: Secondary | ICD-10-CM

## 2023-09-23 DIAGNOSIS — Z87442 Personal history of urinary calculi: Secondary | ICD-10-CM | POA: Diagnosis not present

## 2023-09-23 LAB — RAD ONC ARIA SESSION SUMMARY
Course Elapsed Days: 32
Plan Fractions Treated to Date: 22
Plan Prescribed Dose Per Fraction: 2 Gy
Plan Total Fractions Prescribed: 40
Plan Total Prescribed Dose: 80 Gy
Reference Point Dosage Given to Date: 44 Gy
Reference Point Session Dosage Given: 2 Gy
Session Number: 22

## 2023-09-24 ENCOUNTER — Other Ambulatory Visit: Payer: Self-pay

## 2023-09-24 ENCOUNTER — Ambulatory Visit (INDEPENDENT_AMBULATORY_CARE_PROVIDER_SITE_OTHER): Admitting: Nurse Practitioner

## 2023-09-24 ENCOUNTER — Encounter (INDEPENDENT_AMBULATORY_CARE_PROVIDER_SITE_OTHER): Payer: Self-pay | Admitting: Nurse Practitioner

## 2023-09-24 ENCOUNTER — Ambulatory Visit (INDEPENDENT_AMBULATORY_CARE_PROVIDER_SITE_OTHER)

## 2023-09-24 ENCOUNTER — Ambulatory Visit
Admission: RE | Admit: 2023-09-24 | Discharge: 2023-09-24 | Disposition: A | Discharge status: Home / Self Care | Source: Ambulatory Visit | Attending: Radiation Oncology | Admitting: Radiation Oncology

## 2023-09-24 VITALS — BP 118/81 | HR 67 | Ht 72.0 in | Wt 251.0 lb

## 2023-09-24 DIAGNOSIS — E785 Hyperlipidemia, unspecified: Secondary | ICD-10-CM | POA: Diagnosis not present

## 2023-09-24 DIAGNOSIS — I739 Peripheral vascular disease, unspecified: Secondary | ICD-10-CM | POA: Diagnosis not present

## 2023-09-24 DIAGNOSIS — I6523 Occlusion and stenosis of bilateral carotid arteries: Secondary | ICD-10-CM | POA: Diagnosis not present

## 2023-09-24 DIAGNOSIS — J449 Chronic obstructive pulmonary disease, unspecified: Secondary | ICD-10-CM | POA: Diagnosis not present

## 2023-09-24 DIAGNOSIS — I2510 Atherosclerotic heart disease of native coronary artery without angina pectoris: Secondary | ICD-10-CM | POA: Diagnosis not present

## 2023-09-24 DIAGNOSIS — C61 Malignant neoplasm of prostate: Secondary | ICD-10-CM | POA: Diagnosis not present

## 2023-09-24 DIAGNOSIS — K219 Gastro-esophageal reflux disease without esophagitis: Secondary | ICD-10-CM | POA: Diagnosis not present

## 2023-09-24 DIAGNOSIS — Z191 Hormone sensitive malignancy status: Secondary | ICD-10-CM | POA: Diagnosis not present

## 2023-09-24 DIAGNOSIS — I10 Essential (primary) hypertension: Secondary | ICD-10-CM | POA: Diagnosis not present

## 2023-09-24 DIAGNOSIS — Z87442 Personal history of urinary calculi: Secondary | ICD-10-CM | POA: Diagnosis not present

## 2023-09-24 DIAGNOSIS — I6529 Occlusion and stenosis of unspecified carotid artery: Secondary | ICD-10-CM | POA: Diagnosis not present

## 2023-09-24 DIAGNOSIS — Z510 Encounter for antineoplastic radiation therapy: Secondary | ICD-10-CM | POA: Diagnosis not present

## 2023-09-24 LAB — RAD ONC ARIA SESSION SUMMARY
Course Elapsed Days: 33
Plan Fractions Treated to Date: 23
Plan Prescribed Dose Per Fraction: 2 Gy
Plan Total Fractions Prescribed: 40
Plan Total Prescribed Dose: 80 Gy
Reference Point Dosage Given to Date: 46 Gy
Reference Point Session Dosage Given: 2 Gy
Session Number: 23

## 2023-09-24 NOTE — Progress Notes (Signed)
 Subjective:    [name], male    DOB: [birth year], 78 y.o.   MRN: [id number] Chief Complaint  Patient presents with   Follow-up    1 year carotid + see gs/fb     The patient is seen for follow up evaluation of carotid stenosis status post left carotid endarterectomy on 04/11/2022.  There were no post operative problems or complications related to the surgery.  The patient denies neck or incisional pain.  The patient denies interval amaurosis fugax. There is no recent history of TIA symptoms or focal motor deficits. There is prior documented CVA.  The patient is currently undergoing treatment for prostate cancer.  He is undergoing radiation which she is tolerating well.  The patient denies headache.  The patient is taking enteric-coated aspirin  81 mg daily.  No recent shortening of the patient's walking distance or new symptoms consistent with claudication.  No history of rest pain symptoms. No new ulcers or wounds of the lower extremities have occurred.  There is no history of DVT, PE or superficial thrombophlebitis. No recent episodes of angina or shortness of breath documented.   1 to 39% stenosis noted bilaterally within the internal carotid arteries.  No significant stenosis of the bilateral vertebral arteries.  Normal flow hemodynamics in the bilateral subclavian arteries..     Review of Systems  All other systems reviewed and are negative.      Objective:   Physical Exam Vitals reviewed.  HENT:     Head: Normocephalic.  Neck:     Vascular: No carotid bruit.   Cardiovascular:     Rate and Rhythm: Normal rate.     Pulses:          Radial pulses are 2+ on the right side and 2+ on the left side.  Pulmonary:     Effort: Pulmonary effort is normal.   Skin:    General: Skin is warm and dry.   Neurological:     Mental Status: He is alert and oriented to person, place, and time.   Psychiatric:        Mood and Affect: Mood normal.         Behavior: Behavior normal.        Thought Content: Thought content normal.     BP 118/81   Pulse 67   Ht 6' (1.829 m)   Wt 251 lb (113.9 kg)   BMI 34.04 kg/m   Past Medical History:  Diagnosis Date   Allergic rhinitis, cause unspecified    Allergy    Anxiety    Cancer (HCC)    Basal cell skin cancer,left ear   Carotid artery stenosis    left   Carotid stenosis, symptomatic w/o infarct, left 04/11/2022   Carpal tunnel syndrome    Chronic airway obstruction, not elsewhere classified    Chronic cough 05/25/2016   COPD with acute exacerbation (HCC) 10/02/2007   Qualifier: Diagnosis of  By: Neysa MD, Clinton D    Coronary artery disease    Depression    Dizziness 12/02/2020   Dyspnea    Esophageal reflux    Glaucoma (increased eye pressure)    History of kidney stones    Lipoma of unspecified site    Osteoarthrosis, unspecified whether generalized or localized, lower leg    Other and unspecified hyperlipidemia    Paresthesia of left leg 11/17/2019   Peripheral vascular disease (HCC)    Personal history of colonic polyps    Personal  history of other malignant neoplasm of skin    PONV (postoperative nausea and vomiting)    Stroke [facility])     mini stroke   Unspecified essential hypertension    Viral gastroenteritis 09/20/2021    Social History   Socioeconomic History   Marital status: Divorced    Spouse name: Not on file   Number of children: 2   Years of education: Not on file   Highest education level: 12th grade  Occupational History   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  Tobacco Use   Smoking status: Former    Current packs/day: 0.00    Average packs/day: 3.0 packs/day for 25.0 years (75.0 ttl pk-yrs)    Types: Cigarettes    Start date: 11/24/1971    Quit date: 11/23/1996    Years since quitting: 26.8   Smokeless tobacco: Never  Vaping Use   Vaping status: Never Used  Substance and Sexual Activity   Alcohol use: Not Currently    Alcohol/week: 0.0  standard drinks of alcohol   Drug use: No   Sexual activity: Yes    Partners: Female    Comment: monogamous relationship  Other Topics Concern   Not on file  Social History Narrative   Regular exercise -- no            Social Drivers of Corporate investment banker Strain: Low Risk  (11/08/2022)   Overall Financial Resource Strain (CARDIA)    Difficulty of Paying Living Expenses: Not hard at all  Food Insecurity: No Food Insecurity (11/26/2022)   Hunger Vital Sign    Worried About Running Out of Food in the Last Year: Never true    Ran Out of Food in the Last Year: Never true  Transportation Needs: No Transportation Needs (11/26/2022)   PRAPARE - Administrator, Civil Service (Medical): No    Lack of Transportation (Non-Medical): No  Physical Activity: Insufficiently Active (11/08/2022)   Exercise Vital Sign    Days of Exercise per Week: 1 day    Minutes of Exercise per Session: 10 min  Stress: No Stress Concern Present (11/08/2022)   [employer] of Occupational Health - Occupational Stress Questionnaire    Feeling of Stress : Only a little  Social Connections: Moderately Integrated (11/08/2022)   Social Connection and Isolation Panel    Frequency of Communication with Friends and Family: More than three times a week    Frequency of Social Gatherings with Friends and Family: More than three times a week    Attends Religious Services: More than 4 times per year    Active Member of Clubs or Organizations: Yes    Attends Banker Meetings: More than 4 times per year    Marital Status: Divorced  Intimate Partner Violence: Not At Risk (11/26/2022)   Humiliation, Afraid, Rape, and Kick questionnaire    Fear of Current or Ex-Partner: No    Emotionally Abused: No    Physically Abused: No    Sexually Abused: No    Past Surgical History:  Procedure Laterality Date   BACK SURGERY     lumbar disc surgery   bone spur  06/2003   right thumb   CAROTID  ANGIOGRAPHY Left 01/23/2022   Procedure: CAROTID ANGIOGRAPHY;  Surgeon: Jama Cordella MATSU, MD;  Location: [facility];  Service: Cardiovascular;  Laterality: Left;   CATARACT EXTRACTION     COLONOSCOPY W/ POLYPECTOMY     COLONOSCOPY WITH ESOPHAGOGASTRODUODENOSCOPY (EGD)  ENDARTERECTOMY Left 04/11/2022   Procedure: ENDARTERECTOMY CAROTID;  Surgeon: Jama Cordella MATSU, MD;  Location: [facility] ORS;  Service: Vascular;  Laterality: Left;   KNEE ARTHROSCOPY  09/2007   partial medial mesicecotmy, patellar chonfroplasty   SHOULDER SURGERY  03/24/2007   removal bone spur   SHOULDER SURGERY Left 07/2016   [facility], Dr. Rozelle   TOTAL KNEE ARTHROPLASTY     03-2009 [facility]   TOTAL KNEE ARTHROPLASTY Left 12/2012   [facility]    Family History  Problem Relation Age of Onset   Diabetes Mother    Hyperlipidemia Mother    Hypertension Mother    Heart attack Mother    Obesity Mother    Hyperlipidemia Father    Hypertension Father    Lung cancer Father    Colon polyps Father    Hypertension Sister    Hyperlipidemia Sister    Lung cancer Sister    Hyperlipidemia Sister    Heart attack Brother    Diabetes Brother    Hyperlipidemia Brother    Hypertension Brother    Hyperlipidemia Brother    Hypertension Brother    Drug abuse Daughter    Colon cancer Neg Hx    Esophageal cancer Neg Hx    Pancreatic cancer Neg Hx    Rectal cancer Neg Hx     No Known Allergies     Latest Ref Rng & Units 09/11/2023    8:52 AM 08/28/2023    8:46 AM 06/05/2023    9:01 AM  CBC  WBC 4.0 - 10.5 K/uL 5.0  4.8  5.4   Hemoglobin 13.0 - 17.0 g/dL 87.3  86.6  86.7   Hematocrit 39.0 - 52.0 % 37.8  39.0  39.7   Platelets 150 - 400 K/uL 139  125  137       CMP     Component Value Date/Time   NA 139 11/09/2022 0856   NA 136 01/16/2013 0616   K 4.2 11/09/2022 0856   K 3.5 01/16/2013 0616   CL 101 11/09/2022 0856   CL 105 01/16/2013 0616   CO2 32 11/09/2022 0856   CO2 26  01/16/2013 0616   GLUCOSE 109 (H) 11/09/2022 0856   GLUCOSE 126 (H) 01/16/2013 0616   BUN 17 11/09/2022 0856   BUN 10 01/16/2013 0616   CREATININE 0.94 11/09/2022 0856   CREATININE 0.69 01/16/2013 0616   CALCIUM  9.4 11/09/2022 0856   CALCIUM  9.0 01/16/2013 0616   PROT 6.4 11/09/2022 0856   ALBUMIN 4.1 11/09/2022 0856   AST 20 11/09/2022 0856   ALT 33 11/09/2022 0856   ALKPHOS 58 11/09/2022 0856   BILITOT 0.4 11/09/2022 0856   GFR 78.83 11/09/2022 0856   GFRNONAA >60 04/12/2022 0513   GFRNONAA >60 01/16/2013 0616     No results found.     Assessment & Plan:   1. Bilateral carotid artery stenosis (Primary) Recommend:  Given the patient's asymptomatic subcritical stenosis no further invasive testing or surgery at this time.  Duplex ultrasound shows <40% stenosis bilaterally.  Continue ASA therapy as prescribed Continue management of CAD, HTN and Hyperlipidemia Healthy heart diet,  encouraged exercise at least 4 times per week  Follow up in 12 months with duplex ultrasound and physical exam   2. Essential hypertension Continue antihypertensive medications as already ordered, these medications have been reviewed and there are no changes at this time.  3. Hyperlipidemia LDL goal <70 Continue statin as ordered and reviewed, no changes at this  time   Current Outpatient Medications on File Prior to Visit  Medication Sig Dispense Refill   acetaminophen  (TYLENOL ) 500 MG tablet Take 1,000 mg by mouth every 6 (six) hours as needed.     amLODipine  (NORVASC ) 10 MG tablet TAKE 1 TABLET BY MOUTH EVERY DAY FOR BLOOD PRESSURE 90 tablet 3   aspirin  EC 81 MG tablet Take 1 tablet (81 mg total) by mouth daily at 6 (six) AM. Swallow whole. 30 tablet 11   atorvastatin  (LIPITOR) 80 MG tablet TAKE 1 TABLET BY MOUTH EVERY DAY FOR CHOLESTEROL 90 tablet 0   cyclobenzaprine  (FLEXERIL ) 5 MG tablet Take 1 tablet (5 mg total) by mouth 3 (three) times daily as needed for muscle spasms. 15 tablet 0    fish oil-omega-3 fatty acids 1000 MG capsule Take 2 g by mouth daily.     fluticasone  (CUTIVATE ) 0.05 % cream APPLY TWICE DAILY TO FACE AND EARS FOR 1 WEEK ON AND 1 WEEK OFF, REPEAT AS NEEDED ONLY     ketoconazole (NIZORAL) 2 % shampoo as needed.     losartan  (COZAAR ) 100 MG tablet TAKE 1 TABLET BY MOUTH EVERY DAY FOR BLOOD PRESSURE 90 tablet 0   montelukast  (SINGULAIR ) 10 MG tablet TAKE 1 TABLET (10 MG TOTAL) BY MOUTH AT BEDTIME FOR ALLERGIES 90 tablet 0   Multiple Vitamin (MULTIVITAMIN) tablet Take 1 tablet by mouth daily.     mupirocin  ointment (BACTROBAN ) 2 % Apply topically twice daily. 15 g 0   pantoprazole  (PROTONIX ) 40 MG tablet TAKE 1 TABLET BY MOUTH EVERY DAY FOR HEARTBURN 90 tablet 0   sertraline  (ZOLOFT ) 100 MG tablet TAKE 1 TABLET (100 MG TOTAL) BY MOUTH DAILY. FOR ANXIETY AND DEPRESSION. 90 tablet 0   No current facility-administered medications on file prior to visit.    There are no Patient Instructions on file for this visit. No follow-ups on file.   Faylinn Schwenn E Jashaun Penrose, NP

## 2023-09-25 ENCOUNTER — Ambulatory Visit
Admission: RE | Admit: 2023-09-25 | Discharge: 2023-09-25 | Disposition: A | Discharge status: Home / Self Care | Source: Ambulatory Visit | Attending: Radiation Oncology | Admitting: Radiation Oncology

## 2023-09-25 ENCOUNTER — Inpatient Hospital Stay

## 2023-09-25 ENCOUNTER — Other Ambulatory Visit: Payer: Self-pay

## 2023-09-25 DIAGNOSIS — K219 Gastro-esophageal reflux disease without esophagitis: Secondary | ICD-10-CM | POA: Diagnosis not present

## 2023-09-25 DIAGNOSIS — C61 Malignant neoplasm of prostate: Secondary | ICD-10-CM | POA: Diagnosis not present

## 2023-09-25 DIAGNOSIS — Z87442 Personal history of urinary calculi: Secondary | ICD-10-CM | POA: Diagnosis not present

## 2023-09-25 DIAGNOSIS — I739 Peripheral vascular disease, unspecified: Secondary | ICD-10-CM | POA: Diagnosis not present

## 2023-09-25 DIAGNOSIS — I6529 Occlusion and stenosis of unspecified carotid artery: Secondary | ICD-10-CM | POA: Diagnosis not present

## 2023-09-25 DIAGNOSIS — J449 Chronic obstructive pulmonary disease, unspecified: Secondary | ICD-10-CM | POA: Diagnosis not present

## 2023-09-25 DIAGNOSIS — Z191 Hormone sensitive malignancy status: Secondary | ICD-10-CM | POA: Diagnosis not present

## 2023-09-25 DIAGNOSIS — I10 Essential (primary) hypertension: Secondary | ICD-10-CM | POA: Diagnosis not present

## 2023-09-25 DIAGNOSIS — Z510 Encounter for antineoplastic radiation therapy: Secondary | ICD-10-CM | POA: Diagnosis not present

## 2023-09-25 DIAGNOSIS — E785 Hyperlipidemia, unspecified: Secondary | ICD-10-CM | POA: Diagnosis not present

## 2023-09-25 DIAGNOSIS — I2510 Atherosclerotic heart disease of native coronary artery without angina pectoris: Secondary | ICD-10-CM | POA: Diagnosis not present

## 2023-09-25 LAB — CBC (CANCER CENTER ONLY)
HCT: 36.9 % — ABNORMAL LOW (ref 39.0–52.0)
Hemoglobin: 12.5 g/dL — ABNORMAL LOW (ref 13.0–17.0)
MCH: 30.2 pg (ref 26.0–34.0)
MCHC: 33.9 g/dL (ref 30.0–36.0)
MCV: 89.1 fL (ref 80.0–100.0)
Platelet Count: 111 10*3/uL — ABNORMAL LOW (ref 150–400)
RBC: 4.14 MIL/uL — ABNORMAL LOW (ref 4.22–5.81)
RDW: 12.2 % (ref 11.5–15.5)
WBC Count: 4.2 10*3/uL (ref 4.0–10.5)
nRBC: 0 % (ref 0.0–0.2)

## 2023-09-25 LAB — RAD ONC ARIA SESSION SUMMARY
Course Elapsed Days: 34
Plan Fractions Treated to Date: 24
Plan Prescribed Dose Per Fraction: 2 Gy
Plan Total Fractions Prescribed: 40
Plan Total Prescribed Dose: 80 Gy
Reference Point Dosage Given to Date: 48 Gy
Reference Point Session Dosage Given: 2 Gy
Session Number: 24

## 2023-09-26 ENCOUNTER — Ambulatory Visit
Admission: RE | Admit: 2023-09-26 | Discharge: 2023-09-26 | Disposition: A | Discharge status: Home / Self Care | Source: Ambulatory Visit | Attending: Radiation Oncology | Admitting: Radiation Oncology

## 2023-09-26 ENCOUNTER — Other Ambulatory Visit: Payer: Self-pay

## 2023-09-26 DIAGNOSIS — K219 Gastro-esophageal reflux disease without esophagitis: Secondary | ICD-10-CM | POA: Diagnosis not present

## 2023-09-26 DIAGNOSIS — I6529 Occlusion and stenosis of unspecified carotid artery: Secondary | ICD-10-CM | POA: Diagnosis not present

## 2023-09-26 DIAGNOSIS — Z87442 Personal history of urinary calculi: Secondary | ICD-10-CM | POA: Diagnosis not present

## 2023-09-26 DIAGNOSIS — I2510 Atherosclerotic heart disease of native coronary artery without angina pectoris: Secondary | ICD-10-CM | POA: Diagnosis not present

## 2023-09-26 DIAGNOSIS — I739 Peripheral vascular disease, unspecified: Secondary | ICD-10-CM | POA: Diagnosis not present

## 2023-09-26 DIAGNOSIS — I10 Essential (primary) hypertension: Secondary | ICD-10-CM | POA: Diagnosis not present

## 2023-09-26 DIAGNOSIS — Z191 Hormone sensitive malignancy status: Secondary | ICD-10-CM | POA: Diagnosis not present

## 2023-09-26 DIAGNOSIS — J449 Chronic obstructive pulmonary disease, unspecified: Secondary | ICD-10-CM | POA: Diagnosis not present

## 2023-09-26 DIAGNOSIS — E785 Hyperlipidemia, unspecified: Secondary | ICD-10-CM | POA: Diagnosis not present

## 2023-09-26 DIAGNOSIS — C61 Malignant neoplasm of prostate: Secondary | ICD-10-CM | POA: Diagnosis not present

## 2023-09-26 DIAGNOSIS — Z510 Encounter for antineoplastic radiation therapy: Secondary | ICD-10-CM | POA: Diagnosis not present

## 2023-09-26 LAB — RAD ONC ARIA SESSION SUMMARY
Course Elapsed Days: 35
Plan Fractions Treated to Date: 25
Plan Prescribed Dose Per Fraction: 2 Gy
Plan Total Fractions Prescribed: 40
Plan Total Prescribed Dose: 80 Gy
Reference Point Dosage Given to Date: 50 Gy
Reference Point Session Dosage Given: 2 Gy
Session Number: 25

## 2023-09-27 ENCOUNTER — Ambulatory Visit
Admission: RE | Admit: 2023-09-27 | Discharge: 2023-09-27 | Disposition: A | Discharge status: Home / Self Care | Source: Ambulatory Visit | Attending: Radiation Oncology | Admitting: Radiation Oncology

## 2023-09-27 ENCOUNTER — Other Ambulatory Visit: Payer: Self-pay

## 2023-09-27 DIAGNOSIS — I10 Essential (primary) hypertension: Secondary | ICD-10-CM | POA: Diagnosis not present

## 2023-09-27 DIAGNOSIS — Z191 Hormone sensitive malignancy status: Secondary | ICD-10-CM | POA: Diagnosis not present

## 2023-09-27 DIAGNOSIS — K219 Gastro-esophageal reflux disease without esophagitis: Secondary | ICD-10-CM | POA: Diagnosis not present

## 2023-09-27 DIAGNOSIS — Z87442 Personal history of urinary calculi: Secondary | ICD-10-CM | POA: Diagnosis not present

## 2023-09-27 DIAGNOSIS — I739 Peripheral vascular disease, unspecified: Secondary | ICD-10-CM | POA: Diagnosis not present

## 2023-09-27 DIAGNOSIS — I2510 Atherosclerotic heart disease of native coronary artery without angina pectoris: Secondary | ICD-10-CM | POA: Diagnosis not present

## 2023-09-27 DIAGNOSIS — E785 Hyperlipidemia, unspecified: Secondary | ICD-10-CM | POA: Diagnosis not present

## 2023-09-27 DIAGNOSIS — I6529 Occlusion and stenosis of unspecified carotid artery: Secondary | ICD-10-CM | POA: Diagnosis not present

## 2023-09-27 DIAGNOSIS — C61 Malignant neoplasm of prostate: Secondary | ICD-10-CM | POA: Diagnosis not present

## 2023-09-27 DIAGNOSIS — Z510 Encounter for antineoplastic radiation therapy: Secondary | ICD-10-CM | POA: Diagnosis not present

## 2023-09-27 DIAGNOSIS — J449 Chronic obstructive pulmonary disease, unspecified: Secondary | ICD-10-CM | POA: Diagnosis not present

## 2023-09-27 LAB — RAD ONC ARIA SESSION SUMMARY
Course Elapsed Days: 36
Plan Fractions Treated to Date: 26
Plan Prescribed Dose Per Fraction: 2 Gy
Plan Total Fractions Prescribed: 40
Plan Total Prescribed Dose: 80 Gy
Reference Point Dosage Given to Date: 52 Gy
Reference Point Session Dosage Given: 2 Gy
Session Number: 26

## 2023-09-30 ENCOUNTER — Other Ambulatory Visit: Payer: Self-pay

## 2023-09-30 ENCOUNTER — Ambulatory Visit
Admission: RE | Admit: 2023-09-30 | Discharge: 2023-09-30 | Disposition: A | Discharge status: Home / Self Care | Source: Ambulatory Visit | Attending: Radiation Oncology | Admitting: Radiation Oncology

## 2023-09-30 DIAGNOSIS — J449 Chronic obstructive pulmonary disease, unspecified: Secondary | ICD-10-CM | POA: Diagnosis not present

## 2023-09-30 DIAGNOSIS — E785 Hyperlipidemia, unspecified: Secondary | ICD-10-CM | POA: Diagnosis not present

## 2023-09-30 DIAGNOSIS — K219 Gastro-esophageal reflux disease without esophagitis: Secondary | ICD-10-CM | POA: Diagnosis not present

## 2023-09-30 DIAGNOSIS — Z87442 Personal history of urinary calculi: Secondary | ICD-10-CM | POA: Diagnosis not present

## 2023-09-30 DIAGNOSIS — I739 Peripheral vascular disease, unspecified: Secondary | ICD-10-CM | POA: Diagnosis not present

## 2023-09-30 DIAGNOSIS — I6529 Occlusion and stenosis of unspecified carotid artery: Secondary | ICD-10-CM | POA: Diagnosis not present

## 2023-09-30 DIAGNOSIS — I10 Essential (primary) hypertension: Secondary | ICD-10-CM | POA: Diagnosis not present

## 2023-09-30 DIAGNOSIS — C61 Malignant neoplasm of prostate: Secondary | ICD-10-CM | POA: Diagnosis not present

## 2023-09-30 DIAGNOSIS — I2510 Atherosclerotic heart disease of native coronary artery without angina pectoris: Secondary | ICD-10-CM | POA: Diagnosis not present

## 2023-09-30 LAB — RAD ONC ARIA SESSION SUMMARY
Course Elapsed Days: 39
Plan Fractions Treated to Date: 27
Plan Prescribed Dose Per Fraction: 2 Gy
Plan Total Fractions Prescribed: 40
Plan Total Prescribed Dose: 80 Gy
Reference Point Dosage Given to Date: 54 Gy
Reference Point Session Dosage Given: 2 Gy
Session Number: 27

## 2023-10-01 ENCOUNTER — Other Ambulatory Visit: Payer: Self-pay

## 2023-10-01 ENCOUNTER — Ambulatory Visit
Admission: RE | Admit: 2023-10-01 | Discharge: 2023-10-01 | Disposition: A | Discharge status: Home / Self Care | Source: Ambulatory Visit | Attending: Radiation Oncology | Admitting: Radiation Oncology

## 2023-10-01 DIAGNOSIS — K219 Gastro-esophageal reflux disease without esophagitis: Secondary | ICD-10-CM | POA: Insufficient documentation

## 2023-10-01 DIAGNOSIS — I6529 Occlusion and stenosis of unspecified carotid artery: Secondary | ICD-10-CM | POA: Diagnosis not present

## 2023-10-01 DIAGNOSIS — Z79899 Other long term (current) drug therapy: Secondary | ICD-10-CM | POA: Insufficient documentation

## 2023-10-01 DIAGNOSIS — J449 Chronic obstructive pulmonary disease, unspecified: Secondary | ICD-10-CM | POA: Diagnosis not present

## 2023-10-01 DIAGNOSIS — E785 Hyperlipidemia, unspecified: Secondary | ICD-10-CM | POA: Diagnosis not present

## 2023-10-01 DIAGNOSIS — I739 Peripheral vascular disease, unspecified: Secondary | ICD-10-CM | POA: Insufficient documentation

## 2023-10-01 DIAGNOSIS — I2510 Atherosclerotic heart disease of native coronary artery without angina pectoris: Secondary | ICD-10-CM | POA: Insufficient documentation

## 2023-10-01 DIAGNOSIS — Z85828 Personal history of other malignant neoplasm of skin: Secondary | ICD-10-CM | POA: Diagnosis not present

## 2023-10-01 DIAGNOSIS — Z510 Encounter for antineoplastic radiation therapy: Secondary | ICD-10-CM | POA: Diagnosis not present

## 2023-10-01 DIAGNOSIS — C61 Malignant neoplasm of prostate: Secondary | ICD-10-CM | POA: Insufficient documentation

## 2023-10-01 DIAGNOSIS — Z191 Hormone sensitive malignancy status: Secondary | ICD-10-CM | POA: Diagnosis not present

## 2023-10-01 DIAGNOSIS — Z87442 Personal history of urinary calculi: Secondary | ICD-10-CM | POA: Insufficient documentation

## 2023-10-01 DIAGNOSIS — Z801 Family history of malignant neoplasm of trachea, bronchus and lung: Secondary | ICD-10-CM | POA: Diagnosis not present

## 2023-10-01 DIAGNOSIS — I10 Essential (primary) hypertension: Secondary | ICD-10-CM | POA: Insufficient documentation

## 2023-10-01 DIAGNOSIS — Z87891 Personal history of nicotine dependence: Secondary | ICD-10-CM | POA: Insufficient documentation

## 2023-10-01 DIAGNOSIS — Z7982 Long term (current) use of aspirin: Secondary | ICD-10-CM | POA: Diagnosis not present

## 2023-10-01 LAB — RAD ONC ARIA SESSION SUMMARY
Course Elapsed Days: 40
Plan Fractions Treated to Date: 28
Plan Prescribed Dose Per Fraction: 2 Gy
Plan Total Fractions Prescribed: 40
Plan Total Prescribed Dose: 80 Gy
Reference Point Dosage Given to Date: 56 Gy
Reference Point Session Dosage Given: 2 Gy
Session Number: 28

## 2023-10-02 ENCOUNTER — Ambulatory Visit
Admission: RE | Admit: 2023-10-02 | Discharge: 2023-10-02 | Disposition: A | Discharge status: Home / Self Care | Source: Ambulatory Visit | Attending: Radiation Oncology | Admitting: Radiation Oncology

## 2023-10-02 ENCOUNTER — Other Ambulatory Visit: Payer: Self-pay

## 2023-10-02 DIAGNOSIS — J449 Chronic obstructive pulmonary disease, unspecified: Secondary | ICD-10-CM | POA: Diagnosis not present

## 2023-10-02 DIAGNOSIS — I739 Peripheral vascular disease, unspecified: Secondary | ICD-10-CM | POA: Diagnosis not present

## 2023-10-02 DIAGNOSIS — I6529 Occlusion and stenosis of unspecified carotid artery: Secondary | ICD-10-CM | POA: Diagnosis not present

## 2023-10-02 DIAGNOSIS — I2510 Atherosclerotic heart disease of native coronary artery without angina pectoris: Secondary | ICD-10-CM | POA: Diagnosis not present

## 2023-10-02 DIAGNOSIS — Z510 Encounter for antineoplastic radiation therapy: Secondary | ICD-10-CM | POA: Diagnosis not present

## 2023-10-02 DIAGNOSIS — K219 Gastro-esophageal reflux disease without esophagitis: Secondary | ICD-10-CM | POA: Diagnosis not present

## 2023-10-02 DIAGNOSIS — I10 Essential (primary) hypertension: Secondary | ICD-10-CM | POA: Diagnosis not present

## 2023-10-02 DIAGNOSIS — Z87442 Personal history of urinary calculi: Secondary | ICD-10-CM | POA: Diagnosis not present

## 2023-10-02 DIAGNOSIS — Z191 Hormone sensitive malignancy status: Secondary | ICD-10-CM | POA: Diagnosis not present

## 2023-10-02 DIAGNOSIS — E785 Hyperlipidemia, unspecified: Secondary | ICD-10-CM | POA: Diagnosis not present

## 2023-10-02 DIAGNOSIS — C61 Malignant neoplasm of prostate: Secondary | ICD-10-CM | POA: Diagnosis not present

## 2023-10-02 LAB — RAD ONC ARIA SESSION SUMMARY
Course Elapsed Days: 41
Plan Fractions Treated to Date: 29
Plan Prescribed Dose Per Fraction: 2 Gy
Plan Total Fractions Prescribed: 40
Plan Total Prescribed Dose: 80 Gy
Reference Point Dosage Given to Date: 58 Gy
Reference Point Session Dosage Given: 2 Gy
Session Number: 29

## 2023-10-03 ENCOUNTER — Ambulatory Visit
Admission: RE | Admit: 2023-10-03 | Discharge: 2023-10-03 | Disposition: A | Discharge status: Home / Self Care | Source: Ambulatory Visit | Attending: Radiation Oncology | Admitting: Radiation Oncology

## 2023-10-03 ENCOUNTER — Other Ambulatory Visit: Payer: Self-pay

## 2023-10-03 DIAGNOSIS — J449 Chronic obstructive pulmonary disease, unspecified: Secondary | ICD-10-CM | POA: Diagnosis not present

## 2023-10-03 DIAGNOSIS — Z191 Hormone sensitive malignancy status: Secondary | ICD-10-CM | POA: Diagnosis not present

## 2023-10-03 DIAGNOSIS — I2510 Atherosclerotic heart disease of native coronary artery without angina pectoris: Secondary | ICD-10-CM | POA: Diagnosis not present

## 2023-10-03 DIAGNOSIS — I739 Peripheral vascular disease, unspecified: Secondary | ICD-10-CM | POA: Diagnosis not present

## 2023-10-03 DIAGNOSIS — Z87442 Personal history of urinary calculi: Secondary | ICD-10-CM | POA: Diagnosis not present

## 2023-10-03 DIAGNOSIS — K219 Gastro-esophageal reflux disease without esophagitis: Secondary | ICD-10-CM | POA: Diagnosis not present

## 2023-10-03 DIAGNOSIS — E785 Hyperlipidemia, unspecified: Secondary | ICD-10-CM | POA: Diagnosis not present

## 2023-10-03 DIAGNOSIS — I6529 Occlusion and stenosis of unspecified carotid artery: Secondary | ICD-10-CM | POA: Diagnosis not present

## 2023-10-03 DIAGNOSIS — I10 Essential (primary) hypertension: Secondary | ICD-10-CM | POA: Diagnosis not present

## 2023-10-03 DIAGNOSIS — C61 Malignant neoplasm of prostate: Secondary | ICD-10-CM | POA: Diagnosis not present

## 2023-10-03 DIAGNOSIS — Z510 Encounter for antineoplastic radiation therapy: Secondary | ICD-10-CM | POA: Diagnosis not present

## 2023-10-03 LAB — RAD ONC ARIA SESSION SUMMARY
Course Elapsed Days: 42
Plan Fractions Treated to Date: 30
Plan Prescribed Dose Per Fraction: 2 Gy
Plan Total Fractions Prescribed: 40
Plan Total Prescribed Dose: 80 Gy
Reference Point Dosage Given to Date: 60 Gy
Reference Point Session Dosage Given: 2 Gy
Session Number: 30

## 2023-10-07 ENCOUNTER — Other Ambulatory Visit: Payer: Self-pay

## 2023-10-07 ENCOUNTER — Ambulatory Visit
Admission: RE | Admit: 2023-10-07 | Discharge: 2023-10-07 | Disposition: A | Discharge status: Home / Self Care | Source: Ambulatory Visit | Attending: Radiation Oncology | Admitting: Radiation Oncology

## 2023-10-07 ENCOUNTER — Telehealth: Payer: Self-pay

## 2023-10-07 DIAGNOSIS — K219 Gastro-esophageal reflux disease without esophagitis: Secondary | ICD-10-CM | POA: Diagnosis not present

## 2023-10-07 DIAGNOSIS — E785 Hyperlipidemia, unspecified: Secondary | ICD-10-CM | POA: Diagnosis not present

## 2023-10-07 DIAGNOSIS — Z510 Encounter for antineoplastic radiation therapy: Secondary | ICD-10-CM | POA: Diagnosis not present

## 2023-10-07 DIAGNOSIS — Z191 Hormone sensitive malignancy status: Secondary | ICD-10-CM | POA: Diagnosis not present

## 2023-10-07 DIAGNOSIS — I2510 Atherosclerotic heart disease of native coronary artery without angina pectoris: Secondary | ICD-10-CM | POA: Diagnosis not present

## 2023-10-07 DIAGNOSIS — I6529 Occlusion and stenosis of unspecified carotid artery: Secondary | ICD-10-CM | POA: Diagnosis not present

## 2023-10-07 DIAGNOSIS — I739 Peripheral vascular disease, unspecified: Secondary | ICD-10-CM | POA: Diagnosis not present

## 2023-10-07 DIAGNOSIS — C61 Malignant neoplasm of prostate: Secondary | ICD-10-CM | POA: Diagnosis not present

## 2023-10-07 DIAGNOSIS — J449 Chronic obstructive pulmonary disease, unspecified: Secondary | ICD-10-CM | POA: Diagnosis not present

## 2023-10-07 DIAGNOSIS — Z87442 Personal history of urinary calculi: Secondary | ICD-10-CM | POA: Diagnosis not present

## 2023-10-07 DIAGNOSIS — I10 Essential (primary) hypertension: Secondary | ICD-10-CM | POA: Diagnosis not present

## 2023-10-07 LAB — RAD ONC ARIA SESSION SUMMARY
Course Elapsed Days: 46
Plan Fractions Treated to Date: 31
Plan Prescribed Dose Per Fraction: 2 Gy
Plan Total Fractions Prescribed: 40
Plan Total Prescribed Dose: 80 Gy
Reference Point Dosage Given to Date: 62 Gy
Reference Point Session Dosage Given: 2 Gy
Session Number: 31

## 2023-10-07 NOTE — Telephone Encounter (Signed)
 Copied from CRM [id number]. Topic: Appointments - Scheduling Inquiry for Clinic >> Oct 07, 2023 10:32 AM [place] A wrote: Reason for CRM: Patient said that he has answered the questionnaire of about 50 questions and wants to know does he still need AWV by phone tomorrow. Please call

## 2023-10-08 ENCOUNTER — Other Ambulatory Visit: Payer: Self-pay

## 2023-10-08 ENCOUNTER — Ambulatory Visit
Admission: RE | Admit: 2023-10-08 | Discharge: 2023-10-08 | Disposition: A | Discharge status: Home / Self Care | Source: Ambulatory Visit | Attending: Radiation Oncology | Admitting: Radiation Oncology

## 2023-10-08 ENCOUNTER — Ambulatory Visit (INDEPENDENT_AMBULATORY_CARE_PROVIDER_SITE_OTHER): Payer: Medicare HMO

## 2023-10-08 VITALS — BP 118/76 | Ht 72.0 in | Wt 251.2 lb

## 2023-10-08 DIAGNOSIS — Z87442 Personal history of urinary calculi: Secondary | ICD-10-CM | POA: Diagnosis not present

## 2023-10-08 DIAGNOSIS — K219 Gastro-esophageal reflux disease without esophagitis: Secondary | ICD-10-CM | POA: Diagnosis not present

## 2023-10-08 DIAGNOSIS — J449 Chronic obstructive pulmonary disease, unspecified: Secondary | ICD-10-CM | POA: Diagnosis not present

## 2023-10-08 DIAGNOSIS — Z510 Encounter for antineoplastic radiation therapy: Secondary | ICD-10-CM | POA: Diagnosis not present

## 2023-10-08 DIAGNOSIS — E785 Hyperlipidemia, unspecified: Secondary | ICD-10-CM | POA: Diagnosis not present

## 2023-10-08 DIAGNOSIS — I739 Peripheral vascular disease, unspecified: Secondary | ICD-10-CM | POA: Diagnosis not present

## 2023-10-08 DIAGNOSIS — Z0000 Encounter for general adult medical examination without abnormal findings: Secondary | ICD-10-CM

## 2023-10-08 DIAGNOSIS — Z191 Hormone sensitive malignancy status: Secondary | ICD-10-CM | POA: Diagnosis not present

## 2023-10-08 DIAGNOSIS — I10 Essential (primary) hypertension: Secondary | ICD-10-CM | POA: Diagnosis not present

## 2023-10-08 DIAGNOSIS — I6529 Occlusion and stenosis of unspecified carotid artery: Secondary | ICD-10-CM | POA: Diagnosis not present

## 2023-10-08 DIAGNOSIS — C61 Malignant neoplasm of prostate: Secondary | ICD-10-CM | POA: Diagnosis not present

## 2023-10-08 DIAGNOSIS — I2510 Atherosclerotic heart disease of native coronary artery without angina pectoris: Secondary | ICD-10-CM | POA: Diagnosis not present

## 2023-10-08 LAB — RAD ONC ARIA SESSION SUMMARY
Course Elapsed Days: 47
Plan Fractions Treated to Date: 32
Plan Prescribed Dose Per Fraction: 2 Gy
Plan Total Fractions Prescribed: 40
Plan Total Prescribed Dose: 80 Gy
Reference Point Dosage Given to Date: 64 Gy
Reference Point Session Dosage Given: 2 Gy
Session Number: 32

## 2023-10-08 NOTE — Progress Notes (Addendum)
 Subjective:   Alan [name] is a 78 y.o. who presents for a Medicare Wellness preventive visit.  As a reminder, Annual Wellness Visits don't include a physical exam, and some assessments may be limited, especially if this visit is performed virtually. We may recommend an in-person follow-up visit with your provider if needed.  Visit Complete: In person  Persons Participating in Visit: Patient.  AWV Questionnaire: Yes: Patient Medicare AWV questionnaire was completed by the patient on 10/04/23; I have confirmed that all information answered by patient is correct and no changes since this date.  Cardiac Risk Factors include: advanced age (>59men, >12 women);male gender;dyslipidemia;hypertension;sedentary lifestyle;obesity (BMI >30kg/m2)     Objective:    Today's Vitals   10/08/23 0935  BP: 118/76  Weight: 251 lb 3.2 oz (113.9 kg)  Height: 6' (1.829 m)   Body mass index is 34.07 kg/m.     10/08/2023    9:42 AM 07/18/2023    9:44 AM 06/05/2023    9:10 AM 12/12/2022    2:37 PM 11/26/2022    1:25 PM 10/02/2022    9:38 AM 04/11/2022    8:42 AM  Advanced Directives  Does Patient Have a Medical Advance Directive? No No No No No No No  Would patient like information on creating a medical advance directive?  No - Patient declined  No - Patient declined No - Patient declined;Yes (MAU/Ambulatory/Procedural Areas - Information given) No - Patient declined No - Patient declined    Current Medications (verified) Outpatient Encounter Medications as of 10/08/2023  Medication Sig   acetaminophen  (TYLENOL ) 500 MG tablet Take 1,000 mg by mouth every 6 (six) hours as needed.   amLODipine  (NORVASC ) 10 MG tablet TAKE 1 TABLET BY MOUTH EVERY DAY FOR BLOOD PRESSURE   aspirin  EC 81 MG tablet Take 1 tablet (81 mg total) by mouth daily at 6 (six) AM. Swallow whole.   atorvastatin  (LIPITOR) 80 MG tablet TAKE 1 TABLET BY MOUTH EVERY DAY FOR CHOLESTEROL   cyclobenzaprine  (FLEXERIL ) 5 MG tablet Take 1 tablet  (5 mg total) by mouth 3 (three) times daily as needed for muscle spasms.   fish oil-omega-3 fatty acids 1000 MG capsule Take 2 g by mouth daily.   ketoconazole (NIZORAL) 2 % shampoo as needed.   losartan  (COZAAR ) 100 MG tablet TAKE 1 TABLET BY MOUTH EVERY DAY FOR BLOOD PRESSURE   montelukast  (SINGULAIR ) 10 MG tablet TAKE 1 TABLET (10 MG TOTAL) BY MOUTH AT BEDTIME FOR ALLERGIES   Multiple Vitamin (MULTIVITAMIN) tablet Take 1 tablet by mouth daily.   pantoprazole  (PROTONIX ) 40 MG tablet TAKE 1 TABLET BY MOUTH EVERY DAY FOR HEARTBURN   sertraline  (ZOLOFT ) 100 MG tablet TAKE 1 TABLET (100 MG TOTAL) BY MOUTH DAILY. FOR ANXIETY AND DEPRESSION.   fluticasone  (CUTIVATE ) 0.05 % cream APPLY TWICE DAILY TO FACE AND EARS FOR 1 WEEK ON AND 1 WEEK OFF, REPEAT AS NEEDED ONLY   mupirocin  ointment (BACTROBAN ) 2 % Apply topically twice daily.   No facility-administered encounter medications on file as of 10/08/2023.    Allergies (verified) Patient has no known allergies.   History: Past Medical History:  Diagnosis Date   Allergic rhinitis, cause unspecified    Allergy    Anxiety    Cancer (HCC)    Basal cell skin cancer,left ear   Carotid artery stenosis    left   Carotid stenosis, symptomatic w/o infarct, left 04/11/2022   Carpal tunnel syndrome    Chronic airway obstruction, not elsewhere classified  Chronic cough 05/25/2016   COPD with acute exacerbation (HCC) 10/02/2007   Qualifier: Diagnosis of  By: Neysa MD, Clinton D    Coronary artery disease    Depression    Dizziness 12/02/2020   Dyspnea    Esophageal reflux    Glaucoma (increased eye pressure)    History of kidney stones    Lipoma of unspecified site    Osteoarthrosis, unspecified whether generalized or localized, lower leg    Other and unspecified hyperlipidemia    Paresthesia of left leg 11/17/2019   Peripheral vascular disease (HCC)    Personal history of colonic polyps    Personal history of other malignant neoplasm of skin     PONV (postoperative nausea and vomiting)    Stroke (HCC)     mini stroke   Unspecified essential hypertension    Viral gastroenteritis 09/20/2021   Past Surgical History:  Procedure Laterality Date   BACK SURGERY     lumbar disc surgery   bone spur  06/2003   right thumb   CAROTID ANGIOGRAPHY Left 01/23/2022   Procedure: CAROTID ANGIOGRAPHY;  Surgeon: Jama Cordella MATSU, MD;  Location: [facility];  Service: Cardiovascular;  Laterality: Left;   CATARACT EXTRACTION     COLONOSCOPY W/ POLYPECTOMY     COLONOSCOPY WITH ESOPHAGOGASTRODUODENOSCOPY (EGD)     ENDARTERECTOMY Left 04/11/2022   Procedure: ENDARTERECTOMY CAROTID;  Surgeon: Jama Cordella MATSU, MD;  Location: [facility];  Service: Vascular;  Laterality: Left;   KNEE ARTHROSCOPY  09/2007   partial medial mesicecotmy, patellar chonfroplasty   SHOULDER SURGERY  03/24/2007   removal bone spur   SHOULDER SURGERY Left 07/2016   [facility], Dr. Rozelle   TOTAL KNEE ARTHROPLASTY     03-2009 [facility]   TOTAL KNEE ARTHROPLASTY Left 12/2012   [facility]   Family History  Problem Relation Age of Onset   Diabetes Mother    Hyperlipidemia Mother    Hypertension Mother    Heart attack Mother    Obesity Mother    Hyperlipidemia Father    Hypertension Father    Lung cancer Father    Colon polyps Father    Hypertension Sister    Hyperlipidemia Sister    Lung cancer Sister    Hyperlipidemia Sister    Heart attack Brother    Diabetes Brother    Hyperlipidemia Brother    Hypertension Brother    Hyperlipidemia Brother    Hypertension Brother    Drug abuse Daughter    Colon cancer Neg Hx    Esophageal cancer Neg Hx    Pancreatic cancer Neg Hx    Rectal cancer Neg Hx    Social History   Socioeconomic History   Marital status: Divorced    Spouse name: Not on file   Number of children: 2   Years of education: Not on file   Highest education level: 12th grade  Occupational History   Occupation: focke  Multimedia programmer  Tobacco Use   Smoking status: Former    Current packs/day: 0.00    Average packs/day: 3.0 packs/day for 25.0 years (75.0 ttl pk-yrs)    Types: Cigarettes    Start date: 11/24/1971    Quit date: 11/23/1996    Years since quitting: 26.8   Smokeless tobacco: Never  Vaping Use   Vaping status: Never Used  Substance and Sexual Activity   Alcohol use: Not Currently    Alcohol/week: 0.0 standard drinks of alcohol   Drug use: No  Sexual activity: Yes    Partners: Female    Comment: monogamous relationship  Other Topics Concern   Not on file  Social History Narrative   Regular exercise -- no            Social Drivers of Corporate investment banker Strain: Low Risk  (10/04/2023)   Overall Financial Resource Strain (CARDIA)    Difficulty of Paying Living Expenses: Not very hard  Food Insecurity: No Food Insecurity (10/04/2023)   Hunger Vital Sign    Worried About Running Out of Food in the Last Year: Never true    Ran Out of Food in the Last Year: Never true  Transportation Needs: No Transportation Needs (10/04/2023)   PRAPARE - Administrator, Civil Service (Medical): No    Lack of Transportation (Non-Medical): No  Physical Activity: Inactive (10/04/2023)   Exercise Vital Sign    Days of Exercise per Week: 0 days    Minutes of Exercise per Session: Not on file  Stress: No Stress Concern Present (10/04/2023)   [employer] of Occupational Health - Occupational Stress Questionnaire    Feeling of Stress: Not at all  Social Connections: Moderately Integrated (10/04/2023)   Social Connection and Isolation Panel    Frequency of Communication with Friends and Family: Three times a week    Frequency of Social Gatherings with Friends and Family: Once a week    Attends Religious Services: More than 4 times per year    Active Member of [employer] or Organizations: Yes    Attends Engineer, structural: More than 4 times per year    Marital Status:  Divorced    Tobacco Counseling Counseling given: Not Answered    Clinical Intake:  Pre-visit preparation completed: Yes  Pain : No/denies pain     BMI - recorded: 34.07 Nutritional Status: BMI > 30  Obese Nutritional Risks: None Diabetes: No  Lab Results  Component Value Date   HGBA1C 6.0 11/09/2022   HGBA1C 6.2 11/07/2021   HGBA1C 5.3 05/31/2021     How often do you need to have someone help you when you read instructions, pamphlets, or other written materials from your doctor or pharmacy?: 1 - Never  Interpreter Needed?: No  Comments: lives alone Information entered by :: B.Leilanni Halvorson,LPN   Activities of Daily Living     10/04/2023   12:10 PM  In your present state of health, do you have any difficulty performing the following activities:  Hearing? 0  Vision? 0  Difficulty concentrating or making decisions? 1  Walking or climbing stairs? 0  Dressing or bathing? 0  Doing errands, shopping? 0  Preparing Food and eating ? N  Using the Toilet? N  In the past six months, have you accidently leaked urine? N  Do you have problems with loss of bowel control? N  Managing your Medications? N  Managing your Finances? N  Housekeeping or managing your Housekeeping? N    Patient Care Team: Gretta Comer POUR, NP as PCP - General (Nurse Practitioner) Anner Alm ORN, MD as PCP - Cardiology (Cardiology) Octavia Bruckner, MD as Consulting Physician (Ophthalmology) Verta Royden DASEN, [place] as Consulting Physician (Podiatry) Josefina Chew, MD as Consulting Physician (Orthopedic Surgery) Marquette Ozell BIRCH, DO as Consulting Physician (Sports Medicine) Shari Easter, MD as Consulting Physician (Orthopedic Surgery) [name], Cordella MATSU, MD (Vascular Surgery) Melanee Annah BROCKS, MD as Consulting Physician (Oncology) Lenn Aran, MD as Consulting Physician (Radiation Oncology)  I have updated your Care  Teams any recent Medical Services you may have received from other providers in  the past year.     Assessment:   This is a routine wellness examination for Julus.  Hearing/Vision screen Hearing Screening - Comments:: Pt says his hearing is good Vision Screening - Comments:: Pt says his vision is good; only readers Dr JAYSON Gaudy   Goals Addressed               This Visit's Progress     COMPLETED: DIET - EAT MORE FRUITS AND VEGETABLES   On track     COMPLETED: Increase physical activity (pt-stated)        Starting 10/12/2016, I will resume my previous exercise regimen of going to the gym 3 days weekly.      COMPLETED: Patient Stated        07/10/2019, I will continue to go to the gym 3 days a week for 45 minutes.       Stay Healthy (pt-stated)   On track     10/08/23       Depression Screen     10/08/2023    9:40 AM 05/23/2023   10:07 AM 11/26/2022    1:29 PM 11/09/2022    8:24 AM 10/02/2022    9:36 AM 07/05/2022    8:14 AM 11/07/2021    8:12 AM  PHQ 2/9 Scores  PHQ - 2 Score 0 0 0 0 0 0 0  PHQ- 9 Score  0  0  2 0    Fall Risk     10/04/2023   12:10 PM 05/23/2023   10:07 AM 10/02/2022    9:37 AM 07/05/2022    8:14 AM 08/08/2021    8:56 AM  Fall Risk   Falls in the past year? 0 0 1 0 0  Number falls in past yr: 0 0 0 0 0  Injury with Fall? 0 0 0 0 0  Risk for fall due to : No Fall Risks No Fall Risks No Fall Risks No Fall Risks No Fall Risks  Follow up Education provided;Falls prevention discussed Falls evaluation completed Falls prevention discussed Falls evaluation completed Falls evaluation completed      Data saved with a previous flowsheet row definition    MEDICARE RISK AT HOME:  Medicare Risk at Home Any stairs in or around the home?: (Patient-Rptd) No Home free of loose throw rugs in walkways, pet beds, electrical cords, etc?: (Patient-Rptd) No Adequate lighting in your home to reduce risk of falls?: (Patient-Rptd) Yes Life alert?: (Patient-Rptd) No Use of a cane, walker or w/c?: (Patient-Rptd) No Grab bars in the bathroom?: (Patient-Rptd)  No Shower chair or bench in shower?: (Patient-Rptd) No Elevated toilet seat or a handicapped toilet?: (Patient-Rptd) No  TIMED UP AND GO:  Was the test performed?  Yes  Length of time to ambulate 10 feet: 10 sec Gait steady and fast without use of assistive device  Cognitive Function: 6CIT completed    07/10/2019    2:05 PM 10/18/2017   10:32 AM 10/12/2016    9:54 AM  MMSE - Mini Mental State Exam  Orientation to time 5 5 5    Orientation to Place 5 5 5    Registration 3 3 3    Attention/ Calculation 5 0 0   Recall 3 3 1    Language- name 2 objects  0 0   Language- repeat 1 1 1   Language- follow 3 step command  3 3   Language- read & follow direction  0 0   Write a sentence  0 0   Copy design  0 0   Total score  20 18      Data saved with a previous flowsheet row definition        10/08/2023    9:45 AM 10/02/2022    9:38 AM  6CIT Screen  What Year? 0 points 0 points  What month? 0 points 0 points  What time? 0 points 0 points  Count back from 20 0 points 0 points  Months in reverse 0 points 0 points  Repeat phrase 0 points 0 points  Total Score 0 points 0 points    Immunizations Immunization History  Administered Date(s) Administered   Fluad Quad(high Dose 65+) 12/14/2019, 12/02/2020, 01/30/2022   Influenza Split 03/02/2011, 02/28/2013   Influenza Whole 01/24/2007, 01/06/2008, 02/02/2009   Influenza, High Dose Seasonal PF 12/26/2016, 12/22/2017   Influenza,inj,Quad PF,6+ Mos 12/02/2015   Influenza-Unspecified 03/22/2014, 01/05/2015   PFIZER Comirnaty(Gray Top)Covid-19 Tri-Sucrose Vaccine 08/08/2020   PFIZER(Purple Top)SARS-COV-2 Vaccination 04/27/2019, 05/18/2019, 01/21/2020   Pfizer Covid-19 Vaccine Bivalent Booster 66yrs & up 01/17/2021   Pfizer(Comirnaty)Fall Seasonal Vaccine 12 years and older 01/30/2022, 09/21/2023   Pneumococcal Conjugate-13 05/03/2014   Pneumococcal Polysaccharide-23 12/05/2011   Respiratory Syncytial Virus Vaccine,Recomb Aduvanted(Arexvy)  02/13/2022   Td 06/13/2006   Tdap 10/15/2016   Zoster Recombinant(Shingrix) 02/05/2020, 05/03/2020   Zoster, Live 09/03/2006    Screening Tests Health Maintenance  Topic Date Due   INFLUENZA VACCINE  11/01/2023   COVID-19 Vaccine (8 - Pfizer risk 2024-25 season) 03/22/2024   Medicare Annual Wellness (AWV)  10/07/2024   DTaP/Tdap/Td (3 - Td or Tdap) 10/16/2026   Pneumococcal Vaccine: 50+ Years  Completed   Hepatitis C Screening  Completed   Zoster Vaccines- Shingrix  Completed   Hepatitis B Vaccines  Aged Out   HPV VACCINES  Aged Out   Meningococcal B Vaccine  Aged Out   Colonoscopy  Discontinued    Health Maintenance  There are no preventive care reminders to display for this patient.  Health Maintenance Items Addressed: None needed at this time  Additional Screening:  Vision Screening: Recommended annual ophthalmology exams for early detection of glaucoma and other disorders of the eye. Would you like a referral to an eye doctor? No    Dental Screening: Recommended annual dental exams for proper oral hygiene  Community Resource Referral / Chronic Care Management: CRR required this visit?  No   CCM required this visit?  No   Plan:    I have personally reviewed and noted the following in the patient's chart:   Medical and social history Use of alcohol, tobacco or illicit drugs  Current medications and supplements including opioid prescriptions. Patient is not currently taking opioid prescriptions. Functional ability and status Nutritional status Physical activity Advanced directives List of other physicians Hospitalizations, surgeries, and ER visits in previous 12 months Vitals Screenings to include cognitive, depression, and falls Referrals and appointments  In addition, I have reviewed and discussed with patient certain preventive protocols, quality metrics, and best practice recommendations. A written personalized care plan for preventive services as  well as general preventive health recommendations were provided to patient.   Erminio LITTIE Saris, LPN   05/03/7972   After Visit Summary: pt declined AWV says will view in mychart  Notes: Nothing significant to report at this time.

## 2023-10-08 NOTE — Patient Instructions (Signed)
 Alan [name] , Thank you for taking time out of your busy schedule to complete your Annual Wellness Visit with me. I enjoyed our conversation and look forward to speaking with you again next year. I, as well as your care team,  appreciate your ongoing commitment to your health goals. Please review the following plan we discussed and let me know if I can assist you in the future. Your Game plan/ To Do List    Follow up Visits: Next Medicare AWV with our clinical staff: 10/08/24 @ 9:30am in person   Have you seen your provider in the last 6 months (3 months if uncontrolled diabetes)? Yes Next Office Visit with your provider: 11/12/23 physical  Clinician Recommendations:  Aim for 30 minutes of exercise or brisk walking, 6-8 glasses of water, and 5 servings of fruits and vegetables each day.       This is a list of the screening recommended for you and due dates:  Health Maintenance  Topic Date Due   Flu Shot  11/01/2023   COVID-19 Vaccine (8 - Pfizer risk 2024-25 season) 03/22/2024   Medicare Annual Wellness Visit  10/07/2024   DTaP/Tdap/Td vaccine (3 - Td or Tdap) 10/16/2026   Pneumococcal Vaccine for age over 61  Completed   Hepatitis C Screening  Completed   Zoster (Shingles) Vaccine  Completed   Hepatitis B Vaccine  Aged Out   HPV Vaccine  Aged Out   Meningitis B Vaccine  Aged Out   Colon Cancer Screening  Discontinued    Advanced directives: (Declined) Advance directive discussed with you today. Even though you declined this today, please call our office should you change your mind, and we can give you the proper paperwork for you to fill out. Advance Care Planning is important because it:  [x]  Makes sure you receive the medical care that is consistent with your values, goals, and preferences  [x]  It provides guidance to your family and loved ones and reduces their decisional burden about whether or not they are making the right decisions based on your wishes.  Follow the link  provided in your after visit summary or read over the paperwork we have mailed to you to help you started getting your Advance Directives in place. If you need assistance in completing these, please reach out to us  so that we can help you!

## 2023-10-09 ENCOUNTER — Inpatient Hospital Stay

## 2023-10-09 ENCOUNTER — Ambulatory Visit
Admission: RE | Admit: 2023-10-09 | Discharge: 2023-10-09 | Disposition: A | Discharge status: Home / Self Care | Source: Ambulatory Visit | Attending: Radiation Oncology | Admitting: Radiation Oncology

## 2023-10-09 ENCOUNTER — Other Ambulatory Visit: Payer: Self-pay

## 2023-10-09 DIAGNOSIS — Z87442 Personal history of urinary calculi: Secondary | ICD-10-CM | POA: Diagnosis not present

## 2023-10-09 DIAGNOSIS — Z191 Hormone sensitive malignancy status: Secondary | ICD-10-CM | POA: Diagnosis not present

## 2023-10-09 DIAGNOSIS — D696 Thrombocytopenia, unspecified: Secondary | ICD-10-CM | POA: Insufficient documentation

## 2023-10-09 DIAGNOSIS — C61 Malignant neoplasm of prostate: Secondary | ICD-10-CM | POA: Diagnosis not present

## 2023-10-09 DIAGNOSIS — K219 Gastro-esophageal reflux disease without esophagitis: Secondary | ICD-10-CM | POA: Diagnosis not present

## 2023-10-09 DIAGNOSIS — Z510 Encounter for antineoplastic radiation therapy: Secondary | ICD-10-CM | POA: Diagnosis not present

## 2023-10-09 DIAGNOSIS — I6529 Occlusion and stenosis of unspecified carotid artery: Secondary | ICD-10-CM | POA: Diagnosis not present

## 2023-10-09 DIAGNOSIS — J449 Chronic obstructive pulmonary disease, unspecified: Secondary | ICD-10-CM | POA: Diagnosis not present

## 2023-10-09 DIAGNOSIS — D509 Iron deficiency anemia, unspecified: Secondary | ICD-10-CM | POA: Insufficient documentation

## 2023-10-09 DIAGNOSIS — I10 Essential (primary) hypertension: Secondary | ICD-10-CM | POA: Diagnosis not present

## 2023-10-09 DIAGNOSIS — E785 Hyperlipidemia, unspecified: Secondary | ICD-10-CM | POA: Diagnosis not present

## 2023-10-09 DIAGNOSIS — I2510 Atherosclerotic heart disease of native coronary artery without angina pectoris: Secondary | ICD-10-CM | POA: Diagnosis not present

## 2023-10-09 DIAGNOSIS — I739 Peripheral vascular disease, unspecified: Secondary | ICD-10-CM | POA: Diagnosis not present

## 2023-10-09 LAB — CBC (CANCER CENTER ONLY)
HCT: 36.6 % — ABNORMAL LOW (ref 39.0–52.0)
Hemoglobin: 12.4 g/dL — ABNORMAL LOW (ref 13.0–17.0)
MCH: 30.2 pg (ref 26.0–34.0)
MCHC: 33.9 g/dL (ref 30.0–36.0)
MCV: 89.1 fL (ref 80.0–100.0)
Platelet Count: 111 K/uL — ABNORMAL LOW (ref 150–400)
RBC: 4.11 MIL/uL — ABNORMAL LOW (ref 4.22–5.81)
RDW: 12.3 % (ref 11.5–15.5)
WBC Count: 4 K/uL (ref 4.0–10.5)
nRBC: 0 % (ref 0.0–0.2)

## 2023-10-09 LAB — RAD ONC ARIA SESSION SUMMARY
Course Elapsed Days: 48
Plan Fractions Treated to Date: 33
Plan Prescribed Dose Per Fraction: 2 Gy
Plan Total Fractions Prescribed: 40
Plan Total Prescribed Dose: 80 Gy
Reference Point Dosage Given to Date: 66 Gy
Reference Point Session Dosage Given: 2 Gy
Session Number: 33

## 2023-10-10 ENCOUNTER — Other Ambulatory Visit: Payer: Self-pay

## 2023-10-10 ENCOUNTER — Ambulatory Visit
Admission: RE | Admit: 2023-10-10 | Discharge: 2023-10-10 | Disposition: A | Discharge status: Home / Self Care | Source: Ambulatory Visit | Attending: Radiation Oncology | Admitting: Radiation Oncology

## 2023-10-10 DIAGNOSIS — I739 Peripheral vascular disease, unspecified: Secondary | ICD-10-CM | POA: Diagnosis not present

## 2023-10-10 DIAGNOSIS — J449 Chronic obstructive pulmonary disease, unspecified: Secondary | ICD-10-CM | POA: Diagnosis not present

## 2023-10-10 DIAGNOSIS — Z191 Hormone sensitive malignancy status: Secondary | ICD-10-CM | POA: Diagnosis not present

## 2023-10-10 DIAGNOSIS — E785 Hyperlipidemia, unspecified: Secondary | ICD-10-CM | POA: Diagnosis not present

## 2023-10-10 DIAGNOSIS — Z87442 Personal history of urinary calculi: Secondary | ICD-10-CM | POA: Diagnosis not present

## 2023-10-10 DIAGNOSIS — C61 Malignant neoplasm of prostate: Secondary | ICD-10-CM | POA: Diagnosis not present

## 2023-10-10 DIAGNOSIS — I2510 Atherosclerotic heart disease of native coronary artery without angina pectoris: Secondary | ICD-10-CM | POA: Diagnosis not present

## 2023-10-10 DIAGNOSIS — K219 Gastro-esophageal reflux disease without esophagitis: Secondary | ICD-10-CM | POA: Diagnosis not present

## 2023-10-10 DIAGNOSIS — Z510 Encounter for antineoplastic radiation therapy: Secondary | ICD-10-CM | POA: Diagnosis not present

## 2023-10-10 DIAGNOSIS — I10 Essential (primary) hypertension: Secondary | ICD-10-CM | POA: Diagnosis not present

## 2023-10-10 DIAGNOSIS — I6529 Occlusion and stenosis of unspecified carotid artery: Secondary | ICD-10-CM | POA: Diagnosis not present

## 2023-10-10 LAB — RAD ONC ARIA SESSION SUMMARY
Course Elapsed Days: 49
Plan Fractions Treated to Date: 34
Plan Prescribed Dose Per Fraction: 2 Gy
Plan Total Fractions Prescribed: 40
Plan Total Prescribed Dose: 80 Gy
Reference Point Dosage Given to Date: 68 Gy
Reference Point Session Dosage Given: 2 Gy
Session Number: 34

## 2023-10-11 ENCOUNTER — Ambulatory Visit
Admission: RE | Admit: 2023-10-11 | Discharge: 2023-10-11 | Disposition: A | Discharge status: Home / Self Care | Source: Ambulatory Visit | Attending: Radiation Oncology | Admitting: Radiation Oncology

## 2023-10-11 ENCOUNTER — Other Ambulatory Visit: Payer: Self-pay

## 2023-10-11 DIAGNOSIS — I2510 Atherosclerotic heart disease of native coronary artery without angina pectoris: Secondary | ICD-10-CM | POA: Diagnosis not present

## 2023-10-11 DIAGNOSIS — C61 Malignant neoplasm of prostate: Secondary | ICD-10-CM | POA: Diagnosis not present

## 2023-10-11 DIAGNOSIS — Z510 Encounter for antineoplastic radiation therapy: Secondary | ICD-10-CM | POA: Diagnosis not present

## 2023-10-11 DIAGNOSIS — I10 Essential (primary) hypertension: Secondary | ICD-10-CM | POA: Diagnosis not present

## 2023-10-11 DIAGNOSIS — I6529 Occlusion and stenosis of unspecified carotid artery: Secondary | ICD-10-CM | POA: Diagnosis not present

## 2023-10-11 DIAGNOSIS — E785 Hyperlipidemia, unspecified: Secondary | ICD-10-CM | POA: Diagnosis not present

## 2023-10-11 DIAGNOSIS — Z191 Hormone sensitive malignancy status: Secondary | ICD-10-CM | POA: Diagnosis not present

## 2023-10-11 DIAGNOSIS — Z87442 Personal history of urinary calculi: Secondary | ICD-10-CM | POA: Diagnosis not present

## 2023-10-11 DIAGNOSIS — K219 Gastro-esophageal reflux disease without esophagitis: Secondary | ICD-10-CM | POA: Diagnosis not present

## 2023-10-11 DIAGNOSIS — J449 Chronic obstructive pulmonary disease, unspecified: Secondary | ICD-10-CM | POA: Diagnosis not present

## 2023-10-11 DIAGNOSIS — I739 Peripheral vascular disease, unspecified: Secondary | ICD-10-CM | POA: Diagnosis not present

## 2023-10-11 LAB — RAD ONC ARIA SESSION SUMMARY
Course Elapsed Days: 50
Plan Fractions Treated to Date: 35
Plan Prescribed Dose Per Fraction: 2 Gy
Plan Total Fractions Prescribed: 40
Plan Total Prescribed Dose: 80 Gy
Reference Point Dosage Given to Date: 70 Gy
Reference Point Session Dosage Given: 2 Gy
Session Number: 35

## 2023-10-14 ENCOUNTER — Ambulatory Visit
Admission: RE | Admit: 2023-10-14 | Discharge: 2023-10-14 | Disposition: A | Discharge status: Home / Self Care | Source: Ambulatory Visit | Attending: Radiation Oncology | Admitting: Radiation Oncology

## 2023-10-14 ENCOUNTER — Other Ambulatory Visit: Payer: Self-pay

## 2023-10-14 DIAGNOSIS — I10 Essential (primary) hypertension: Secondary | ICD-10-CM | POA: Diagnosis not present

## 2023-10-14 DIAGNOSIS — Z191 Hormone sensitive malignancy status: Secondary | ICD-10-CM | POA: Diagnosis not present

## 2023-10-14 DIAGNOSIS — Z510 Encounter for antineoplastic radiation therapy: Secondary | ICD-10-CM | POA: Diagnosis not present

## 2023-10-14 DIAGNOSIS — I2510 Atherosclerotic heart disease of native coronary artery without angina pectoris: Secondary | ICD-10-CM | POA: Diagnosis not present

## 2023-10-14 DIAGNOSIS — E785 Hyperlipidemia, unspecified: Secondary | ICD-10-CM | POA: Diagnosis not present

## 2023-10-14 DIAGNOSIS — C61 Malignant neoplasm of prostate: Secondary | ICD-10-CM | POA: Diagnosis not present

## 2023-10-14 DIAGNOSIS — K219 Gastro-esophageal reflux disease without esophagitis: Secondary | ICD-10-CM | POA: Diagnosis not present

## 2023-10-14 DIAGNOSIS — J449 Chronic obstructive pulmonary disease, unspecified: Secondary | ICD-10-CM | POA: Diagnosis not present

## 2023-10-14 DIAGNOSIS — I739 Peripheral vascular disease, unspecified: Secondary | ICD-10-CM | POA: Diagnosis not present

## 2023-10-14 DIAGNOSIS — I6529 Occlusion and stenosis of unspecified carotid artery: Secondary | ICD-10-CM | POA: Diagnosis not present

## 2023-10-14 DIAGNOSIS — Z87442 Personal history of urinary calculi: Secondary | ICD-10-CM | POA: Diagnosis not present

## 2023-10-14 LAB — RAD ONC ARIA SESSION SUMMARY
Course Elapsed Days: 53
Plan Fractions Treated to Date: 36
Plan Prescribed Dose Per Fraction: 2 Gy
Plan Total Fractions Prescribed: 40
Plan Total Prescribed Dose: 80 Gy
Reference Point Dosage Given to Date: 72 Gy
Reference Point Session Dosage Given: 2 Gy
Session Number: 36

## 2023-10-15 ENCOUNTER — Ambulatory Visit
Admission: RE | Admit: 2023-10-15 | Discharge: 2023-10-15 | Disposition: A | Discharge status: Home / Self Care | Source: Ambulatory Visit | Attending: Radiation Oncology | Admitting: Radiation Oncology

## 2023-10-15 ENCOUNTER — Other Ambulatory Visit: Payer: Self-pay

## 2023-10-15 DIAGNOSIS — Z191 Hormone sensitive malignancy status: Secondary | ICD-10-CM | POA: Diagnosis not present

## 2023-10-15 DIAGNOSIS — Z87442 Personal history of urinary calculi: Secondary | ICD-10-CM | POA: Diagnosis not present

## 2023-10-15 DIAGNOSIS — Z510 Encounter for antineoplastic radiation therapy: Secondary | ICD-10-CM | POA: Diagnosis not present

## 2023-10-15 DIAGNOSIS — K219 Gastro-esophageal reflux disease without esophagitis: Secondary | ICD-10-CM | POA: Diagnosis not present

## 2023-10-15 DIAGNOSIS — J449 Chronic obstructive pulmonary disease, unspecified: Secondary | ICD-10-CM | POA: Diagnosis not present

## 2023-10-15 DIAGNOSIS — I2510 Atherosclerotic heart disease of native coronary artery without angina pectoris: Secondary | ICD-10-CM | POA: Diagnosis not present

## 2023-10-15 DIAGNOSIS — C61 Malignant neoplasm of prostate: Secondary | ICD-10-CM | POA: Diagnosis not present

## 2023-10-15 DIAGNOSIS — I739 Peripheral vascular disease, unspecified: Secondary | ICD-10-CM | POA: Diagnosis not present

## 2023-10-15 DIAGNOSIS — E785 Hyperlipidemia, unspecified: Secondary | ICD-10-CM | POA: Diagnosis not present

## 2023-10-15 DIAGNOSIS — I10 Essential (primary) hypertension: Secondary | ICD-10-CM | POA: Diagnosis not present

## 2023-10-15 DIAGNOSIS — I6529 Occlusion and stenosis of unspecified carotid artery: Secondary | ICD-10-CM | POA: Diagnosis not present

## 2023-10-15 LAB — RAD ONC ARIA SESSION SUMMARY
Course Elapsed Days: 54
Plan Fractions Treated to Date: 37
Plan Prescribed Dose Per Fraction: 2 Gy
Plan Total Fractions Prescribed: 40
Plan Total Prescribed Dose: 80 Gy
Reference Point Dosage Given to Date: 74 Gy
Reference Point Session Dosage Given: 2 Gy
Session Number: 37

## 2023-10-16 ENCOUNTER — Other Ambulatory Visit: Payer: Self-pay

## 2023-10-16 ENCOUNTER — Ambulatory Visit
Admission: RE | Admit: 2023-10-16 | Discharge: 2023-10-16 | Disposition: A | Discharge status: Home / Self Care | Source: Ambulatory Visit | Attending: Radiation Oncology | Admitting: Radiation Oncology

## 2023-10-16 DIAGNOSIS — Z87442 Personal history of urinary calculi: Secondary | ICD-10-CM | POA: Diagnosis not present

## 2023-10-16 DIAGNOSIS — J449 Chronic obstructive pulmonary disease, unspecified: Secondary | ICD-10-CM | POA: Diagnosis not present

## 2023-10-16 DIAGNOSIS — K219 Gastro-esophageal reflux disease without esophagitis: Secondary | ICD-10-CM | POA: Diagnosis not present

## 2023-10-16 DIAGNOSIS — I2510 Atherosclerotic heart disease of native coronary artery without angina pectoris: Secondary | ICD-10-CM | POA: Diagnosis not present

## 2023-10-16 DIAGNOSIS — E785 Hyperlipidemia, unspecified: Secondary | ICD-10-CM | POA: Diagnosis not present

## 2023-10-16 DIAGNOSIS — I739 Peripheral vascular disease, unspecified: Secondary | ICD-10-CM | POA: Diagnosis not present

## 2023-10-16 DIAGNOSIS — C61 Malignant neoplasm of prostate: Secondary | ICD-10-CM | POA: Diagnosis not present

## 2023-10-16 DIAGNOSIS — I6529 Occlusion and stenosis of unspecified carotid artery: Secondary | ICD-10-CM | POA: Diagnosis not present

## 2023-10-16 DIAGNOSIS — Z191 Hormone sensitive malignancy status: Secondary | ICD-10-CM | POA: Diagnosis not present

## 2023-10-16 DIAGNOSIS — Z510 Encounter for antineoplastic radiation therapy: Secondary | ICD-10-CM | POA: Diagnosis not present

## 2023-10-16 DIAGNOSIS — I10 Essential (primary) hypertension: Secondary | ICD-10-CM | POA: Diagnosis not present

## 2023-10-16 LAB — RAD ONC ARIA SESSION SUMMARY
Course Elapsed Days: 55
Plan Fractions Treated to Date: 38
Plan Prescribed Dose Per Fraction: 2 Gy
Plan Total Fractions Prescribed: 40
Plan Total Prescribed Dose: 80 Gy
Reference Point Dosage Given to Date: 76 Gy
Reference Point Session Dosage Given: 2 Gy
Session Number: 38

## 2023-10-17 ENCOUNTER — Other Ambulatory Visit: Payer: Self-pay

## 2023-10-17 ENCOUNTER — Ambulatory Visit
Admission: RE | Admit: 2023-10-17 | Discharge: 2023-10-17 | Disposition: A | Discharge status: Home / Self Care | Source: Ambulatory Visit | Attending: Radiation Oncology | Admitting: Radiation Oncology

## 2023-10-17 DIAGNOSIS — J449 Chronic obstructive pulmonary disease, unspecified: Secondary | ICD-10-CM | POA: Diagnosis not present

## 2023-10-17 DIAGNOSIS — E785 Hyperlipidemia, unspecified: Secondary | ICD-10-CM | POA: Diagnosis not present

## 2023-10-17 DIAGNOSIS — K219 Gastro-esophageal reflux disease without esophagitis: Secondary | ICD-10-CM | POA: Diagnosis not present

## 2023-10-17 DIAGNOSIS — I10 Essential (primary) hypertension: Secondary | ICD-10-CM | POA: Diagnosis not present

## 2023-10-17 DIAGNOSIS — Z87442 Personal history of urinary calculi: Secondary | ICD-10-CM | POA: Diagnosis not present

## 2023-10-17 DIAGNOSIS — I2510 Atherosclerotic heart disease of native coronary artery without angina pectoris: Secondary | ICD-10-CM | POA: Diagnosis not present

## 2023-10-17 DIAGNOSIS — Z191 Hormone sensitive malignancy status: Secondary | ICD-10-CM | POA: Diagnosis not present

## 2023-10-17 DIAGNOSIS — I6529 Occlusion and stenosis of unspecified carotid artery: Secondary | ICD-10-CM | POA: Diagnosis not present

## 2023-10-17 DIAGNOSIS — I739 Peripheral vascular disease, unspecified: Secondary | ICD-10-CM | POA: Diagnosis not present

## 2023-10-17 DIAGNOSIS — C61 Malignant neoplasm of prostate: Secondary | ICD-10-CM | POA: Diagnosis not present

## 2023-10-17 DIAGNOSIS — Z510 Encounter for antineoplastic radiation therapy: Secondary | ICD-10-CM | POA: Diagnosis not present

## 2023-10-17 LAB — RAD ONC ARIA SESSION SUMMARY
Course Elapsed Days: 56
Plan Fractions Treated to Date: 39
Plan Prescribed Dose Per Fraction: 2 Gy
Plan Total Fractions Prescribed: 40
Plan Total Prescribed Dose: 80 Gy
Reference Point Dosage Given to Date: 78 Gy
Reference Point Session Dosage Given: 2 Gy
Session Number: 39

## 2023-10-18 ENCOUNTER — Other Ambulatory Visit: Payer: Self-pay

## 2023-10-18 ENCOUNTER — Ambulatory Visit
Admission: RE | Admit: 2023-10-18 | Discharge: 2023-10-18 | Disposition: A | Discharge status: Home / Self Care | Source: Ambulatory Visit | Attending: Radiation Oncology | Admitting: Radiation Oncology

## 2023-10-18 DIAGNOSIS — I10 Essential (primary) hypertension: Secondary | ICD-10-CM | POA: Diagnosis not present

## 2023-10-18 DIAGNOSIS — I6529 Occlusion and stenosis of unspecified carotid artery: Secondary | ICD-10-CM | POA: Diagnosis not present

## 2023-10-18 DIAGNOSIS — J449 Chronic obstructive pulmonary disease, unspecified: Secondary | ICD-10-CM | POA: Diagnosis not present

## 2023-10-18 DIAGNOSIS — I2510 Atherosclerotic heart disease of native coronary artery without angina pectoris: Secondary | ICD-10-CM | POA: Diagnosis not present

## 2023-10-18 DIAGNOSIS — Z87442 Personal history of urinary calculi: Secondary | ICD-10-CM | POA: Diagnosis not present

## 2023-10-18 DIAGNOSIS — E785 Hyperlipidemia, unspecified: Secondary | ICD-10-CM | POA: Diagnosis not present

## 2023-10-18 DIAGNOSIS — K219 Gastro-esophageal reflux disease without esophagitis: Secondary | ICD-10-CM | POA: Diagnosis not present

## 2023-10-18 DIAGNOSIS — Z191 Hormone sensitive malignancy status: Secondary | ICD-10-CM | POA: Diagnosis not present

## 2023-10-18 DIAGNOSIS — C61 Malignant neoplasm of prostate: Secondary | ICD-10-CM | POA: Diagnosis not present

## 2023-10-18 DIAGNOSIS — I739 Peripheral vascular disease, unspecified: Secondary | ICD-10-CM | POA: Diagnosis not present

## 2023-10-18 DIAGNOSIS — Z510 Encounter for antineoplastic radiation therapy: Secondary | ICD-10-CM | POA: Diagnosis not present

## 2023-10-18 LAB — RAD ONC ARIA SESSION SUMMARY
Course Elapsed Days: 57
Plan Fractions Treated to Date: 40
Plan Prescribed Dose Per Fraction: 2 Gy
Plan Total Fractions Prescribed: 40
Plan Total Prescribed Dose: 80 Gy
Reference Point Dosage Given to Date: 80 Gy
Reference Point Session Dosage Given: 2 Gy
Session Number: 40

## 2023-10-21 ENCOUNTER — Encounter: Payer: Self-pay | Admitting: Pharmacist

## 2023-10-21 NOTE — Progress Notes (Signed)
 Pharmacy Quality Measure Review  This patient is appearing on a report for being at risk of failing the adherence measure for cholesterol (statin) medications this calendar year.   Medication: ATORVASTATIN  80 mg Last fill date: 05/18/2023 for 90 day supply  Insurance report was not up to date. No action needed at this time.  Medication has been refilled as of 09/22/2023 for 90 day supply.   Refills remaining: None New Rx needed by: September

## 2023-10-21 NOTE — Radiation Completion Notes (Signed)
 Patient Name: Alan [name], Alan [name] MRN: [id number] Date of Birth: 11/30/1945 Referring Physician: LUCIE HONES, M.D. Date of Service: 2023-10-21 Radiation Oncologist: Marcey Penton, M.D. [facility]                             RADIATION ONCOLOGY END OF TREATMENT NOTE     Diagnosis: C61 Malignant neoplasm of prostate Intent: Curative     HPI: Patient is a 78 year old male who presented with a bump in his PSA from the prior year from 2.3 up to 4.47.  Physical exam showed no nodularity in the prostate.  He underwent an MRI scan showing a category 5 PI-RADS lesion in the right posterior lateral peripheral zone.  There was mild capsular bulging vicinity raising a possibility of trans capsular spread.  He had no evidence of bone metastasis or pelvic adenopathy.  He underwent UroNav biopsy which was positive for Gleason 7 (4+3) adenocarcinoma consisting of 60% of the tumor tissue submitted.  Patient is fairly asymptomatic specifically denies any increased low urinary tract symptoms diarrhea or fatigue he is having no bone pain.  He is scheduled for PSMA PET scan tomorrow.      ==========DELIVERED PLANS==========  First Treatment Date: 2023-08-22 Last Treatment Date: 2023-10-18   Plan Name: Prostate Site: Prostate Technique: IMRT Mode: Photon Dose Per Fraction: 2 Gy Prescribed Dose (Delivered / Prescribed): 80 Gy / 80 Gy Prescribed Fxs (Delivered / Prescribed): 40 / 40     ==========ON TREATMENT VISIT DATES========== 2023-08-27, 2023-09-03, 2023-09-10, 2023-09-17, 2023-09-24, 2023-10-01, 2023-10-08, 2023-10-15     ==========UPCOMING VISITS==========       ==========APPENDIX - ON TREATMENT VISIT NOTES==========   See weekly On Treatment Notes in Epic for details in the Media tab (listed as Progress notes on the On Treatment Visit Dates listed above).

## 2023-11-12 ENCOUNTER — Telehealth: Payer: Self-pay | Admitting: Primary Care

## 2023-11-12 ENCOUNTER — Encounter: Payer: Self-pay | Admitting: Primary Care

## 2023-11-12 ENCOUNTER — Ambulatory Visit: Payer: Self-pay | Admitting: Primary Care

## 2023-11-12 ENCOUNTER — Ambulatory Visit (INDEPENDENT_AMBULATORY_CARE_PROVIDER_SITE_OTHER): Admitting: Primary Care

## 2023-11-12 VITALS — BP 108/64 | HR 66 | Temp 97.6°F | Ht 72.0 in | Wt 250.0 lb

## 2023-11-12 DIAGNOSIS — G8929 Other chronic pain: Secondary | ICD-10-CM

## 2023-11-12 DIAGNOSIS — R7303 Prediabetes: Secondary | ICD-10-CM

## 2023-11-12 DIAGNOSIS — K219 Gastro-esophageal reflux disease without esophagitis: Secondary | ICD-10-CM | POA: Diagnosis not present

## 2023-11-12 DIAGNOSIS — F32A Depression, unspecified: Secondary | ICD-10-CM | POA: Diagnosis not present

## 2023-11-12 DIAGNOSIS — Z8546 Personal history of malignant neoplasm of prostate: Secondary | ICD-10-CM | POA: Insufficient documentation

## 2023-11-12 DIAGNOSIS — M545 Low back pain, unspecified: Secondary | ICD-10-CM | POA: Diagnosis not present

## 2023-11-12 DIAGNOSIS — F411 Generalized anxiety disorder: Secondary | ICD-10-CM

## 2023-11-12 DIAGNOSIS — Z0000 Encounter for general adult medical examination without abnormal findings: Secondary | ICD-10-CM

## 2023-11-12 DIAGNOSIS — E785 Hyperlipidemia, unspecified: Secondary | ICD-10-CM

## 2023-11-12 DIAGNOSIS — I10 Essential (primary) hypertension: Secondary | ICD-10-CM

## 2023-11-12 DIAGNOSIS — B078 Other viral warts: Secondary | ICD-10-CM | POA: Diagnosis not present

## 2023-11-12 DIAGNOSIS — J3089 Other allergic rhinitis: Secondary | ICD-10-CM

## 2023-11-12 DIAGNOSIS — D696 Thrombocytopenia, unspecified: Secondary | ICD-10-CM

## 2023-11-12 DIAGNOSIS — B079 Viral wart, unspecified: Secondary | ICD-10-CM | POA: Insufficient documentation

## 2023-11-12 LAB — COMPREHENSIVE METABOLIC PANEL WITH GFR
ALT: 46 U/L (ref 0–53)
AST: 24 U/L (ref 0–37)
Albumin: 4 g/dL (ref 3.5–5.2)
Alkaline Phosphatase: 60 U/L (ref 39–117)
BUN: 14 mg/dL (ref 6–23)
CO2: 32 meq/L (ref 19–32)
Calcium: 9.2 mg/dL (ref 8.4–10.5)
Chloride: 103 meq/L (ref 96–112)
Creatinine, Ser: 0.76 mg/dL (ref 0.40–1.50)
GFR: 86.79 mL/min (ref >60.00)
Glucose, Bld: 132 mg/dL — ABNORMAL HIGH (ref 70–99)
Potassium: 3.9 meq/L (ref 3.5–5.1)
Sodium: 142 meq/L (ref 135–145)
Total Bilirubin: 0.5 mg/dL (ref 0.2–1.2)
Total Protein: 6.5 g/dL (ref 6.0–8.3)

## 2023-11-12 LAB — HEMOGLOBIN A1C: Hgb A1c MFr Bld: 6 % (ref 4.6–6.5)

## 2023-11-12 LAB — LIPID PANEL
Cholesterol: 169 mg/dL (ref 0–200)
HDL: 53.4 mg/dL (ref >39.00)
LDL Cholesterol: 91 mg/dL (ref 0–99)
NonHDL: 115.53
Total CHOL/HDL Ratio: 3
Triglycerides: 124 mg/dL (ref 0.0–149.0)
VLDL: 24.8 mg/dL (ref 0.0–40.0)

## 2023-11-12 MED ORDER — AMLODIPINE BESYLATE 5 MG PO TABS: 5.0000 mg | ORAL_TABLET | Freq: Every day | ORAL | 3 refills | Status: AC

## 2023-11-12 NOTE — Assessment & Plan Note (Signed)
Repeat lipid panel pending. Continue atorvastatin 80 mg daily.

## 2023-11-12 NOTE — Assessment & Plan Note (Signed)
 Immunizations UTD. Colonoscopy UTD, no further screening needed given age PSA followed by Urology  Discussed the importance of a healthy diet and regular exercise in order for weight loss, and to reduce the risk of further co-morbidity.  Exam stable. Labs pending.  Follow up in 1 year for repeat physical.

## 2023-11-12 NOTE — Assessment & Plan Note (Signed)
 Followed by oncology/hematology. Reviewed CBC from July 2025

## 2023-11-12 NOTE — Assessment & Plan Note (Signed)
 Controlled.  Continue Zoloft  100 mg daily

## 2023-11-12 NOTE — Telephone Encounter (Signed)
 When checking out for appt with Randall, pt requested his decreasing the dosage of BP meds? Randall requested sending a note back to [facility] regarding meds, pt declined & stated he wasn't going to worry about meds. Lvmtcb to see which BP meds was the pt requesting a decrease on? A rx for mLODipine (NORVASC ) 5 MG tablet, was sent to [facility] today for pt. Not sure if pt was referring to that medication or not. FYI

## 2023-11-12 NOTE — Assessment & Plan Note (Signed)
 Controlled.  Continue Singulair  10 mg at bedtime.

## 2023-11-12 NOTE — Assessment & Plan Note (Signed)
 Controlled.  Continue pantoprazole 40 mg daily.

## 2023-11-12 NOTE — Progress Notes (Signed)
 Subjective:    [name], male    DOB: 27-Feb-1946, 78 y.o.   MRN: [id number]  HPI  Alan [name] is a very pleasant 78 y.o. male who presents today for complete physical and follow up of chronic conditions.  Immunizations: -Tetanus: Completed in 2018  -Shingles: Completed Shingrix series -Pneumonia: Completed Prevnar 13 in 2016 pneumovax 23 in 2013  Diet: Fair diet.  Exercise: No regular exercise.  Eye exam: Completes annually  Dental exam: Completes semi-annually    Colonoscopy: Completed in 2022, no further screening per report  PSA: UTD, follows with Urology   BP Readings from Last 3 Encounters:  11/12/23 108/64  10/08/23 118/76  09/24/23 118/81   He would like a wart to be frozen off today.  Acute for the last 2 months and located to the left third digit.  He has a history of several warts which have been present previously per dermatology.  He has an appoint with dermatology scheduled for later this month.     Review of Systems  Constitutional:  Negative for unexpected weight change.  HENT:  Negative for rhinorrhea.   Respiratory:  Negative for cough and shortness of breath.   Cardiovascular:  Negative for chest pain.  Gastrointestinal:  Negative for constipation and diarrhea.  Genitourinary:  Negative for difficulty urinating.  Musculoskeletal:  Negative for arthralgias and myalgias.  Skin:  Negative for rash.       Skin mass to left finger  Allergic/Immunologic: Negative for environmental allergies.  Neurological:  Positive for dizziness. Negative for headaches.         Past Medical History:  Diagnosis Date   Allergic rhinitis, cause unspecified    Allergy    Anxiety    Cancer (HCC)    Basal cell skin cancer,left ear   Carotid artery stenosis    left   Carotid stenosis, symptomatic w/o infarct, left 04/11/2022   Carpal tunnel syndrome    Chronic airway obstruction, not elsewhere classified    Chronic cough 05/25/2016    COPD with acute exacerbation (HCC) 10/02/2007   Qualifier: Diagnosis of  By: Neysa MD, Clinton D    Coronary artery disease    Depression    Dizziness 12/02/2020   Dyspnea    Esophageal reflux    Glaucoma (increased eye pressure)    History of kidney stones    Lipoma of unspecified site    Osteoarthrosis, unspecified whether generalized or localized, lower leg    Other and unspecified hyperlipidemia    Paresthesia of left leg 11/17/2019   Peripheral vascular disease (HCC)    Personal history of colonic polyps    Personal history of other malignant neoplasm of skin    PONV (postoperative nausea and vomiting)    Stroke (HCC)     mini stroke   Unspecified essential hypertension    Viral gastroenteritis 09/20/2021    Social History   Socioeconomic History   Marital status: Divorced    Spouse name: Not on file   Number of children: 2   Years of education: Not on file   Highest education level: 12th grade  Occupational History   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  Tobacco Use   Smoking status: Former    Current packs/day: 0.00    Average packs/day: 3.0 packs/day for 25.0 years (75.0 ttl pk-yrs)    Types: Cigarettes    Start date: 11/24/1971    Quit date: 11/23/1996    Years since quitting: 26.9   Smokeless  tobacco: Never  Vaping Use   Vaping status: Never Used  Substance and Sexual Activity   Alcohol use: Not Currently    Alcohol/week: 0.0 standard drinks of alcohol   Drug use: No   Sexual activity: Yes    Partners: Female    Comment: monogamous relationship  Other Topics Concern   Not on file  Social History Narrative   Regular exercise -- no            Social Drivers of Corporate investment banker Strain: Low Risk  (10/04/2023)   Overall Financial Resource Strain (CARDIA)    Difficulty of Paying Living Expenses: Not very hard  Food Insecurity: No Food Insecurity (10/04/2023)   Hunger Vital Sign    Worried About Running Out of Food in the Last Year:  Never true    Ran Out of Food in the Last Year: Never true  Transportation Needs: No Transportation Needs (10/04/2023)   PRAPARE - Administrator, Civil Service (Medical): No    Lack of Transportation (Non-Medical): No  Physical Activity: Inactive (10/04/2023)   Exercise Vital Sign    Days of Exercise per Week: 0 days    Minutes of Exercise per Session: Not on file  Stress: No Stress Concern Present (10/04/2023)   [employer] of Occupational Health - Occupational Stress Questionnaire    Feeling of Stress: Not at all  Social Connections: Moderately Integrated (10/04/2023)   Social Connection and Isolation Panel    Frequency of Communication with Friends and Family: Three times a week    Frequency of Social Gatherings with Friends and Family: Once a week    Attends Religious Services: More than 4 times per year    Active Member of Clubs or Organizations: Yes    Attends Banker Meetings: More than 4 times per year    Marital Status: Divorced  Intimate Partner Violence: Not At Risk (10/08/2023)   Humiliation, Afraid, Rape, and Kick questionnaire    Fear of Current or Ex-Partner: No    Emotionally Abused: No    Physically Abused: No    Sexually Abused: No    Past Surgical History:  Procedure Laterality Date   BACK SURGERY     lumbar disc surgery   bone spur  06/2003   right thumb   CAROTID ANGIOGRAPHY Left 01/23/2022   Procedure: CAROTID ANGIOGRAPHY;  Surgeon: Jama Cordella MATSU, MD;  Location: [facility];  Service: Cardiovascular;  Laterality: Left;   CATARACT EXTRACTION     COLONOSCOPY W/ POLYPECTOMY     COLONOSCOPY WITH ESOPHAGOGASTRODUODENOSCOPY (EGD)     ENDARTERECTOMY Left 04/11/2022   Procedure: ENDARTERECTOMY CAROTID;  Surgeon: Jama Cordella MATSU, MD;  Location: [facility];  Service: Vascular;  Laterality: Left;   KNEE ARTHROSCOPY  09/2007   partial medial mesicecotmy, patellar chonfroplasty   SHOULDER SURGERY  03/24/2007   removal bone spur    SHOULDER SURGERY Left 07/2016   [facility], Dr. Rozelle   TOTAL KNEE ARTHROPLASTY     03-2009 [facility]   TOTAL KNEE ARTHROPLASTY Left 12/2012   [facility]    Family History  Problem Relation Age of Onset   Diabetes Mother    Hyperlipidemia Mother    Hypertension Mother    Heart attack Mother    Obesity Mother    Hyperlipidemia Father    Hypertension Father    Lung cancer Father    Colon polyps Father    Hypertension Sister    Hyperlipidemia  Sister    Lung cancer Sister    Hyperlipidemia Sister    Heart attack Brother    Diabetes Brother    Hyperlipidemia Brother    Hypertension Brother    Hyperlipidemia Brother    Hypertension Brother    Drug abuse Daughter    Colon cancer Neg Hx    Esophageal cancer Neg Hx    Pancreatic cancer Neg Hx    Rectal cancer Neg Hx     No Known Allergies  Current Outpatient Medications on File Prior to Visit  Medication Sig Dispense Refill   acetaminophen  (TYLENOL ) 500 MG tablet Take 1,000 mg by mouth every 6 (six) hours as needed.     aspirin  EC 81 MG tablet Take 1 tablet (81 mg total) by mouth daily at 6 (six) AM. Swallow whole. 30 tablet 11   atorvastatin  (LIPITOR) 80 MG tablet TAKE 1 TABLET BY MOUTH EVERY DAY FOR CHOLESTEROL 90 tablet 0   cyclobenzaprine  (FLEXERIL ) 5 MG tablet Take 1 tablet (5 mg total) by mouth 3 (three) times daily as needed for muscle spasms. 15 tablet 0   fish oil-omega-3 fatty acids 1000 MG capsule Take 2 g by mouth daily.     ketoconazole (NIZORAL) 2 % shampoo as needed.     losartan  (COZAAR ) 100 MG tablet TAKE 1 TABLET BY MOUTH EVERY DAY FOR BLOOD PRESSURE 90 tablet 0   montelukast  (SINGULAIR ) 10 MG tablet TAKE 1 TABLET (10 MG TOTAL) BY MOUTH AT BEDTIME FOR ALLERGIES 90 tablet 0   Multiple Vitamin (MULTIVITAMIN) tablet Take 1 tablet by mouth daily.     pantoprazole  (PROTONIX ) 40 MG tablet TAKE 1 TABLET BY MOUTH EVERY DAY FOR HEARTBURN 90 tablet 0   sertraline  (ZOLOFT ) 100 MG tablet TAKE 1 TABLET  (100 MG TOTAL) BY MOUTH DAILY. FOR ANXIETY AND DEPRESSION. 90 tablet 0   fluticasone  (CUTIVATE ) 0.05 % cream APPLY TWICE DAILY TO FACE AND EARS FOR 1 WEEK ON AND 1 WEEK OFF, REPEAT AS NEEDED ONLY (Patient not taking: Reported on 11/12/2023)     mupirocin  ointment (BACTROBAN ) 2 % Apply topically twice daily. (Patient not taking: Reported on 11/12/2023) 15 g 0   No current facility-administered medications on file prior to visit.    BP 108/64   Pulse 66   Temp 97.6 F (36.4 C) (Temporal)   Ht 6' (1.829 m)   Wt 250 lb (113.4 kg)   SpO2 96%   BMI 33.91 kg/m  Objective:   Physical Exam HENT:     Right Ear: Tympanic membrane and ear canal normal.     Left Ear: Tympanic membrane and ear canal normal.  Eyes:     Pupils: Pupils are equal, round, and reactive to light.  Cardiovascular:     Rate and Rhythm: Normal rate and regular rhythm.  Pulmonary:     Effort: Pulmonary effort is normal.     Breath sounds: Normal breath sounds.  Abdominal:     General: Bowel sounds are normal.     Palpations: Abdomen is soft.     Tenderness: There is no abdominal tenderness.  Musculoskeletal:        General: Normal range of motion.     Cervical back: Neck supple.  Skin:    General: Skin is warm and dry.     Comments: Dark brown scaly mass to tip of left 3rd digit.  Neurological:     Mental Status: He is alert and oriented to person, place, and time.     Cranial  Nerves: No cranial nerve deficit.     Deep Tendon Reflexes:     Reflex Scores:      Patellar reflexes are 2+ on the right side and 2+ on the left side. Psychiatric:        Mood and Affect: Mood normal.           Assessment & Plan:  Preventative health care Assessment & Plan: Immunizations UTD. Colonoscopy UTD, no further screening needed given age PSA followed by Urology  Discussed the importance of a healthy diet and regular exercise in order for weight loss, and to reduce the risk of further co-morbidity.  Exam  stable. Labs pending.  Follow up in 1 year for repeat physical.    History of prostate cancer Assessment & Plan: Following with Urology, office notes reviewed from June 2025.     Gastroesophageal reflux disease, unspecified whether esophagitis present Assessment & Plan: Controlled.  Continue pantoprazole  40 mg daily.   Chronic bilateral low back pain without sciatica Assessment & Plan: Stable.  Continue cyclobenzaprine  5 mg as needed.   Essential hypertension Assessment & Plan: Low today, also symptomatic with postural dizziness.  Home readings are also low.  Reduce amlodipine  to 5 mg daily. Continue losartan  100 mg daily for now.  He will monitor his blood pressure and update.  Orders: -     amLODIPine  Besylate; Take 1 tablet (5 mg total) by mouth daily. for blood pressure.  Dispense: 90 tablet; Refill: 3  Non-seasonal allergic rhinitis, unspecified trigger Assessment & Plan: Controlled.  Continue Singulair  10 mg at bedtime.   Thrombocytopenia (HCC) Assessment & Plan: Followed by oncology/hematology. Reviewed CBC from July 2025   Depression, unspecified depression type Assessment & Plan: Controlled.  Continue Zoloft  100 mg daily.   GAD (generalized anxiety disorder) Assessment & Plan: Controlled.  Continue Zoloft  100 mg daily.   Hyperlipidemia LDL goal <70 Assessment & Plan: Repeat lipid panel pending. Continue atorvastatin  80 mg daily.  Orders: -     Lipid panel -     Comprehensive metabolic panel with GFR  Prediabetes Assessment & Plan: Repeat A1C pending.  Discussed the importance of a healthy diet and regular exercise in order for weight loss, and to reduce the risk of further co-morbidity.   Orders: -     Hemoglobin A1c  Other viral warts Assessment & Plan: Exam today most representative.   Patient requesting cryotherapy. Verbal consent obtained. Cryotherapy completed, patient tolerated well. Home instructions  provided.  Follow-up with dermatology as scheduled.          Vasti Yagi K Olanda Boughner, NP

## 2023-11-12 NOTE — Assessment & Plan Note (Signed)
 Exam today most representative.   Patient requesting cryotherapy. Verbal consent obtained. Cryotherapy completed, patient tolerated well. Home instructions provided.  Follow-up with dermatology as scheduled.

## 2023-11-12 NOTE — Patient Instructions (Signed)
 We reduced the dose of your blood pressure pill amlodipine  to 5 mg once daily.  Continue taking losartan  100 mg daily for blood pressure.  Stop by the lab prior to leaving today. I will notify you of your results once received.   Follow-up with your dermatologist as scheduled.  It was a pleasure to see you today!

## 2023-11-12 NOTE — Assessment & Plan Note (Signed)
 Low today, also symptomatic with postural dizziness.  Home readings are also low.  Reduce amlodipine  to 5 mg daily. Continue losartan  100 mg daily for now.  He will monitor his blood pressure and update.

## 2023-11-12 NOTE — Assessment & Plan Note (Signed)
Repeat A1C pending.  Discussed the importance of a healthy diet and regular exercise in order for weight loss, and to reduce the risk of further co-morbidity.  

## 2023-11-12 NOTE — Telephone Encounter (Signed)
 Unable to reach patient. Left voicemail to return call to our office.

## 2023-11-12 NOTE — Assessment & Plan Note (Signed)
Stable. ? ?Continue cyclobenzaprine 5 mg as needed. ?

## 2023-11-12 NOTE — Telephone Encounter (Signed)
 Yes, we discussed this in great detail today. I also included it on his AVS. Please call patient and let him know that I reduced the dose of his amlodipine  from 10 mg daily to 5 mg daily for blood pressure.  I sent a new prescription to his pharmacy.  Please have him reference his AVS on MyChart if he forgets.

## 2023-11-12 NOTE — Assessment & Plan Note (Signed)
 Following with Urology, office notes reviewed from June 2025.

## 2023-11-14 NOTE — Telephone Encounter (Signed)
 Called patient and reviewed all information. Patient verbalized understanding. Will call if any further questions.

## 2023-11-16 ENCOUNTER — Other Ambulatory Visit: Payer: Self-pay | Admitting: Primary Care

## 2023-11-16 DIAGNOSIS — K219 Gastro-esophageal reflux disease without esophagitis: Secondary | ICD-10-CM

## 2023-11-17 ENCOUNTER — Other Ambulatory Visit: Payer: Self-pay | Admitting: Primary Care

## 2023-11-17 DIAGNOSIS — I10 Essential (primary) hypertension: Secondary | ICD-10-CM

## 2023-11-17 DIAGNOSIS — F411 Generalized anxiety disorder: Secondary | ICD-10-CM

## 2023-11-17 DIAGNOSIS — F3342 Major depressive disorder, recurrent, in full remission: Secondary | ICD-10-CM

## 2023-11-20 ENCOUNTER — Encounter: Payer: Self-pay | Admitting: Radiation Oncology

## 2023-11-20 ENCOUNTER — Other Ambulatory Visit: Payer: Self-pay | Admitting: *Deleted

## 2023-11-20 ENCOUNTER — Ambulatory Visit
Admission: RE | Admit: 2023-11-20 | Discharge: 2023-11-20 | Discharge status: Home / Self Care | Source: Ambulatory Visit | Attending: Radiation Oncology | Admitting: Radiation Oncology

## 2023-11-20 VITALS — BP 143/78 | HR 69 | Temp 97.4°F | Resp 12 | Wt 249.0 lb

## 2023-11-20 DIAGNOSIS — C61 Malignant neoplasm of prostate: Secondary | ICD-10-CM | POA: Diagnosis not present

## 2023-11-20 NOTE — Progress Notes (Signed)
 Radiation Oncology Follow up Note  Name: Alan [name]   Date:   11/20/2023 MRN:  [id number] DOB: March 10, 1946    This 78 y.o. 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  REFERRING PROVIDER: Gretta Comer POUR, NP  HPI: Patient is a 78 year old male now out 1 month having completed image guided IMRT radiation therapy for Gleason 7 adenocarcinoma the prostate.  Seen today in routine follow-up he is doing well specifically denies any increased lower Neri tract symptoms diarrhea or fatigue he has nocturia x 3..  COMPLICATIONS OF TREATMENT: none  FOLLOW UP COMPLIANCE: keeps appointments   PHYSICAL EXAM:  BP (!) 143/78   Pulse 69   Temp (!) 97.4 F (36.3 C) (Tympanic)   Resp 12   Wt 249 lb (112.9 kg)   BMI 33.77 kg/m  Well-developed well-nourished patient in NAD. HEENT reveals PERLA, EOMI, discs not visualized.  Oral cavity is clear. No oral mucosal lesions are identified. Neck is clear without evidence of cervical or supraclavicular adenopathy. Lungs are clear to A&P. Cardiac examination is essentially unremarkable with regular rate and rhythm without murmur rub or thrill. Abdomen is benign with no organomegaly or masses noted. Motor sensory and DTR levels are equal and symmetric in the upper and lower extremities. Cranial nerves II through XII are grossly intact. Proprioception is intact. No peripheral adenopathy or edema is identified. No motor or sensory levels are noted. Crude visual fields are within normal range.  RADIOLOGY RESULTS: No current films to review  PLAN: Present time patient is doing well with a low side effect profile from his radiation therapy.  And pleased with his overall progress.  Of asked to see him back in 3 months with repeat PSA at that time.  Patient comprehends my recommendations well.  I would  like to take this opportunity to thank you for allowing me to participate in the care of your patient.SABRA Marcey Penton, MD

## 2023-11-27 DIAGNOSIS — R238 Other skin changes: Secondary | ICD-10-CM | POA: Diagnosis not present

## 2023-11-27 DIAGNOSIS — L538 Other specified erythematous conditions: Secondary | ICD-10-CM | POA: Diagnosis not present

## 2023-11-27 DIAGNOSIS — L570 Actinic keratosis: Secondary | ICD-10-CM | POA: Diagnosis not present

## 2023-11-27 DIAGNOSIS — B078 Other viral warts: Secondary | ICD-10-CM | POA: Diagnosis not present

## 2023-12-04 DIAGNOSIS — H3562 Retinal hemorrhage, left eye: Secondary | ICD-10-CM | POA: Diagnosis not present

## 2023-12-04 DIAGNOSIS — H34212 Partial retinal artery occlusion, left eye: Secondary | ICD-10-CM | POA: Diagnosis not present

## 2023-12-04 DIAGNOSIS — H4312 Vitreous hemorrhage, left eye: Secondary | ICD-10-CM | POA: Diagnosis not present

## 2023-12-06 ENCOUNTER — Inpatient Hospital Stay: Attending: Oncology

## 2023-12-06 DIAGNOSIS — C61 Malignant neoplasm of prostate: Secondary | ICD-10-CM | POA: Diagnosis not present

## 2023-12-06 DIAGNOSIS — D509 Iron deficiency anemia, unspecified: Secondary | ICD-10-CM | POA: Insufficient documentation

## 2023-12-06 DIAGNOSIS — Z862 Personal history of diseases of the blood and blood-forming organs and certain disorders involving the immune mechanism: Secondary | ICD-10-CM

## 2023-12-06 LAB — CBC WITH DIFFERENTIAL (CANCER CENTER ONLY)
Abs Immature Granulocytes: 0.03 K/uL (ref 0.00–0.07)
Basophils Absolute: 0 K/uL (ref 0.0–0.1)
Basophils Relative: 0 %
Eosinophils Absolute: 0.3 K/uL (ref 0.0–0.5)
Eosinophils Relative: 6 %
HCT: 34.5 % — ABNORMAL LOW (ref 39.0–52.0)
Hemoglobin: 11.7 g/dL — ABNORMAL LOW (ref 13.0–17.0)
Immature Granulocytes: 1 %
Lymphocytes Relative: 17 %
Lymphs Abs: 0.8 K/uL (ref 0.7–4.0)
MCH: 30.2 pg (ref 26.0–34.0)
MCHC: 33.9 g/dL (ref 30.0–36.0)
MCV: 88.9 fL (ref 80.0–100.0)
Monocytes Absolute: 0.6 K/uL (ref 0.1–1.0)
Monocytes Relative: 12 %
Neutro Abs: 3.1 K/uL (ref 1.7–7.7)
Neutrophils Relative %: 64 %
Platelet Count: 106 K/uL — ABNORMAL LOW (ref 150–400)
RBC: 3.88 MIL/uL — ABNORMAL LOW (ref 4.22–5.81)
RDW: 12.1 % (ref 11.5–15.5)
WBC Count: 4.9 K/uL (ref 4.0–10.5)
nRBC: 0 % (ref 0.0–0.2)

## 2023-12-06 LAB — IRON AND TIBC
Iron: 44 ug/dL — ABNORMAL LOW (ref 45–182)
Saturation Ratios: 16 % — ABNORMAL LOW (ref 17.9–39.5)
TIBC: 270 ug/dL (ref 250–450)
UIBC: 226 ug/dL

## 2023-12-06 LAB — FERRITIN: Ferritin: 66 ng/mL (ref 24–336)

## 2023-12-16 DIAGNOSIS — H43813 Vitreous degeneration, bilateral: Secondary | ICD-10-CM | POA: Diagnosis not present

## 2023-12-16 DIAGNOSIS — H40013 Open angle with borderline findings, low risk, bilateral: Secondary | ICD-10-CM | POA: Diagnosis not present

## 2023-12-16 DIAGNOSIS — H4313 Vitreous hemorrhage, bilateral: Secondary | ICD-10-CM | POA: Diagnosis not present

## 2023-12-16 DIAGNOSIS — H34232 Retinal artery branch occlusion, left eye: Secondary | ICD-10-CM | POA: Diagnosis not present

## 2023-12-16 DIAGNOSIS — H3522 Other non-diabetic proliferative retinopathy, left eye: Secondary | ICD-10-CM | POA: Diagnosis not present

## 2023-12-17 NOTE — Progress Notes (Signed)
 "    Kathern Lobosco T. Jabria Loos, MD, CAQ Sports Medicine [facility] [address] [place], [address]  Phone: [phone]  FAX: [phone]  Alan [name] - 78 y.o. male  MRN [id number]  Date of Birth: 03/28/1946  Date: 12/18/2023  PCP: Gretta Comer POUR, NP  Referral: Gretta Comer POUR, NP  Chief Complaint  Patient presents with   Wrist Pain    Bilateral Injections    Subjective:   Alan [name] is a 78 y.o. very pleasant male patient with Body mass index is 33.67 kg/m. who presents with the following:  Discussed the use of AI scribe software for clinical note transcription with the patient, who gave verbal consent to proceed.  Pleasant patient who I have seen multiple times over the years.  I have injected his MCP joint of the left second digit multiple times in the past. History of Present Illness Alan [name] is a 78 year old male with carpal tunnel syndrome who presents for follow-up after a prior injection.  He continues to experience numbness and tingling in his hands, particularly when sitting at a table, despite receiving a carpal tunnel injection approximately six months ago that provided temporary relief. The symptoms are more pronounced while eating, with one hand experiencing less severe tingling.  He has a history of prostate cancer, having completed 40 rounds of radiation therapy two months ago. He reports that he was told the cancer is localized to the prostate and that imaging showed no evidence of spread.  He experiences generalized joint pain, particularly in the ankles, hips, lower back, neck, and shoulders. He has undergone surgeries on both shoulders and his back. The joint pain is described as an 'ache,' and his shoulders 'pop and crack,' which he attributes to arthritis. He takes Tylenol  daily for pain management.    Review of Systems is noted in the HPI, as appropriate  Objective:   BP 120/78    Pulse 82   Temp 98.2 F (36.8 C) (Temporal)   Ht 6' (1.829 m)   Wt 248 lb 4 oz (112.6 kg)   SpO2 95%   BMI 33.67 kg/m   GEN: No acute distress; alert,appropriate. PULM: Breathing comfortably in no respiratory distress PSYCH: Normally interactive.   Positive Tinel's and Phalen's bilaterally  Physical Exam   Laboratory and Imaging Data:  Assessment and Plan:     ICD-10-CM   1. Bilateral carpal tunnel syndrome  G56.03     2. 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 Previous injection provided temporary relief. Symptoms suggest need for further intervention. - Administer carpal tunnel injection into the volar or palmar surface of the wrist. - Consider nerve conduction studies to assess severity of nerve impairment. - Discuss potential referral to a hand surgeon for carpal tunnel release if symptoms persist.  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 - Continue acetaminophen  as needed for pain management. - Consider ibuprofen  or Aleve for additional pain relief, monitor kidney function.  Carpal Tunnel Injection Procedure Note Alan [name] December 12, 1945 Date of procedure: 12/18/2023  Procedure: Carpal Tunnel Injection, R Indications: Numbness  Procedure Details Verbal consent was obtained. Risks, benefits, and alternatives were discussed. Prepped with Chloraprep and Ethyl Chloride used for anesthesia. Under sterile conditions,  the patient  was injected just ulnar to the palmaris longus tendon at the wrist flexion crease.  The needle was inserted at 45 degree angle aiming distally. Aspiration showed no blood. Medication flowed freely without resistance.  Needle size: 22 gauge 1 1/2 inch Injection: 1/2 cc of Lidocaine  1% and Kenalog  20  mg Medication: 1/2 cc of Kenalog  40 mg (equaling Kenalog  20 mg)   Carpal Tunnel Injection Procedure Note Alan [name] 09-12-1945 Date of procedure: 12/18/2023  Procedure: Carpal Tunnel Injection, L Indications: Numbness  Procedure Details Verbal consent was obtained. Risks, benefits, and alternatives were discussed. Prepped with Chloraprep and Ethyl Chloride used for anesthesia. Under sterile conditions,  the patient was injected just ulnar to the palmaris longus tendon at the wrist flexion crease.  The needle was inserted at 45 degree angle aiming distally. Aspiration showed no blood. Medication flowed freely without resistance.  Needle size: 22 gauge 1 1/2 inch Injection: 1/2 cc of Lidocaine  1% and Kenalog  20 mg Medication: 1/2 cc of Kenalog  40 mg (equaling Kenalog  20 mg)   Medication Management during today's office visit: No orders of the defined types were placed in this encounter.  Medications Discontinued During This Encounter  Medication Reason   mupirocin  ointment (BACTROBAN ) 2 % Completed Course    Orders placed today for conditions managed today: No orders of the defined types were placed in this encounter.   Disposition: No follow-ups on file.  [employer] One speech-to-text software was used for transcription in this dictation.  Possible transcriptional errors can occur using Animal nutritionist.   Signed,  [name]. Savan Ruta, MD   Outpatient Encounter Medications as of 12/18/2023  Medication Sig   acetaminophen  (TYLENOL ) 500 MG tablet Take 1,000 mg by mouth every 6 (six) hours as needed.   amLODipine  (NORVASC ) 5 MG tablet Take 1 tablet (5 mg total) by mouth daily. for blood pressure.   aspirin  EC 81 MG tablet Take 1 tablet (81 mg total) by mouth daily at 6 (six) AM. Swallow whole.   atorvastatin  (LIPITOR) 80 MG tablet TAKE 1 TABLET BY MOUTH EVERY DAY FOR CHOLESTEROL   cyclobenzaprine  (FLEXERIL ) 5 MG tablet Take 1 tablet (5 mg total) by mouth 3 (three) times  daily as needed for muscle spasms.   fish oil-omega-3 fatty acids 1000 MG capsule Take 2 g by mouth daily.   fluticasone  (CUTIVATE ) 0.05 % cream APPLY TWICE DAILY TO FACE AND EARS FOR 1 WEEK ON AND 1 WEEK OFF, REPEAT AS NEEDED ONLY   ketoconazole (NIZORAL) 2 % shampoo as needed.   losartan  (COZAAR ) 100 MG tablet TAKE 1 TABLET BY MOUTH EVERY DAY FOR BLOOD PRESSURE   montelukast  (SINGULAIR ) 10 MG tablet TAKE 1 TABLET (10 MG TOTAL) BY MOUTH AT BEDTIME FOR ALLERGIES   Multiple Vitamin (MULTIVITAMIN) tablet Take 1 tablet by mouth daily.   pantoprazole  (PROTONIX ) 40 MG tablet TAKE 1 TABLET BY MOUTH EVERY DAY FOR HEARTBURN   sertraline  (ZOLOFT ) 100 MG tablet TAKE 1 TABLET (100 MG TOTAL) BY MOUTH DAILY. FOR ANXIETY AND DEPRESSION.   [DISCONTINUED] mupirocin  ointment (BACTROBAN ) 2 % Apply topically twice daily. (Patient not taking: Reported on 11/20/2023)   No facility-administered encounter medications on file as of 12/18/2023.   "

## 2023-12-18 ENCOUNTER — Ambulatory Visit (INDEPENDENT_AMBULATORY_CARE_PROVIDER_SITE_OTHER): Admitting: Family Medicine

## 2023-12-18 ENCOUNTER — Encounter: Payer: Self-pay | Admitting: Family Medicine

## 2023-12-18 VITALS — BP 120/78 | HR 82 | Temp 98.2°F | Ht 72.0 in | Wt 248.2 lb

## 2023-12-18 DIAGNOSIS — M19042 Primary osteoarthritis, left hand: Secondary | ICD-10-CM | POA: Diagnosis not present

## 2023-12-18 DIAGNOSIS — G5603 Carpal tunnel syndrome, bilateral upper limbs: Secondary | ICD-10-CM

## 2023-12-18 MED ORDER — TRIAMCINOLONE ACETONIDE 40 MG/ML IJ SUSP
20.0000 mg | Freq: Once | INTRAMUSCULAR | Status: AC
  Administered 2023-12-18: 20 mg via INTRA_ARTICULAR

## 2023-12-18 NOTE — Addendum Note (Signed)
 Addended by: WENDELL ARLAND RAMAN on: 12/18/2023 11:18 AM   Modules accepted: Orders

## 2023-12-23 ENCOUNTER — Other Ambulatory Visit: Payer: Self-pay | Admitting: Primary Care

## 2023-12-23 DIAGNOSIS — E782 Mixed hyperlipidemia: Secondary | ICD-10-CM

## 2023-12-23 NOTE — Telephone Encounter (Signed)
 Copied from CRM [id number]. Topic: Clinical - Medication Refill >> Dec 23, 2023 12:10 PM Aleatha C wrote: Medication:  atorvastatin  (LIPITOR) 80 MG tablet   Has the patient contacted their pharmacy? Yes (Agent: If no, request that the patient contact the pharmacy for the refill. If patient does not wish to contact the pharmacy document the reason why and proceed with request.) (Agent: If yes, when and what did the pharmacy advise?)  This is the patient's preferred pharmacy:  [id number] [place], [place] - [address] [place] [address] Phone: [phone] Fax: [phone]    Is this the correct pharmacy for this prescription? Yes If no, delete pharmacy and type the correct one.   Has the prescription been filled recently? No  Is the patient out of the medication? Yes  Has the patient been seen for an appointment in the last year OR does the patient have an upcoming appointment? Yes  Can we respond through MyChart? No  Agent: Please be advised that Rx refills may take up to 3 business days. We ask that you follow-up with your pharmacy.

## 2024-02-09 DIAGNOSIS — E785 Hyperlipidemia, unspecified: Secondary | ICD-10-CM | POA: Diagnosis not present

## 2024-02-09 DIAGNOSIS — Z8546 Personal history of malignant neoplasm of prostate: Secondary | ICD-10-CM | POA: Diagnosis not present

## 2024-02-09 DIAGNOSIS — I70209 Unspecified atherosclerosis of native arteries of extremities, unspecified extremity: Secondary | ICD-10-CM | POA: Diagnosis not present

## 2024-02-09 DIAGNOSIS — Z85828 Personal history of other malignant neoplasm of skin: Secondary | ICD-10-CM | POA: Diagnosis not present

## 2024-02-09 DIAGNOSIS — M519 Unspecified thoracic, thoracolumbar and lumbosacral intervertebral disc disorder: Secondary | ICD-10-CM | POA: Diagnosis not present

## 2024-02-09 DIAGNOSIS — F1021 Alcohol dependence, in remission: Secondary | ICD-10-CM | POA: Diagnosis not present

## 2024-02-09 DIAGNOSIS — Z8249 Family history of ischemic heart disease and other diseases of the circulatory system: Secondary | ICD-10-CM | POA: Diagnosis not present

## 2024-02-09 DIAGNOSIS — K219 Gastro-esophageal reflux disease without esophagitis: Secondary | ICD-10-CM | POA: Diagnosis not present

## 2024-02-09 DIAGNOSIS — E669 Obesity, unspecified: Secondary | ICD-10-CM | POA: Diagnosis not present

## 2024-02-09 DIAGNOSIS — M1990 Unspecified osteoarthritis, unspecified site: Secondary | ICD-10-CM | POA: Diagnosis not present

## 2024-02-09 DIAGNOSIS — Z6833 Body mass index (BMI) 33.0-33.9, adult: Secondary | ICD-10-CM | POA: Diagnosis not present

## 2024-02-09 DIAGNOSIS — F411 Generalized anxiety disorder: Secondary | ICD-10-CM | POA: Diagnosis not present

## 2024-02-09 DIAGNOSIS — I10 Essential (primary) hypertension: Secondary | ICD-10-CM | POA: Diagnosis not present

## 2024-02-09 DIAGNOSIS — F325 Major depressive disorder, single episode, in full remission: Secondary | ICD-10-CM | POA: Diagnosis not present

## 2024-02-09 DIAGNOSIS — Z8673 Personal history of transient ischemic attack (TIA), and cerebral infarction without residual deficits: Secondary | ICD-10-CM | POA: Diagnosis not present

## 2024-02-14 ENCOUNTER — Other Ambulatory Visit: Payer: Self-pay | Admitting: *Deleted

## 2024-02-14 DIAGNOSIS — C61 Malignant neoplasm of prostate: Secondary | ICD-10-CM

## 2024-02-17 ENCOUNTER — Inpatient Hospital Stay: Attending: Oncology

## 2024-02-17 DIAGNOSIS — C61 Malignant neoplasm of prostate: Secondary | ICD-10-CM | POA: Insufficient documentation

## 2024-02-17 LAB — CBC (CANCER CENTER ONLY)
HCT: 38.9 % — ABNORMAL LOW (ref 39.0–52.0)
Hemoglobin: 12.9 g/dL — ABNORMAL LOW (ref 13.0–17.0)
MCH: 29.4 pg (ref 26.0–34.0)
MCHC: 33.2 g/dL (ref 30.0–36.0)
MCV: 88.6 fL (ref 80.0–100.0)
Platelet Count: 111 K/uL — ABNORMAL LOW (ref 150–400)
RBC: 4.39 MIL/uL (ref 4.22–5.81)
RDW: 12.2 % (ref 11.5–15.5)
WBC Count: 4.4 K/uL (ref 4.0–10.5)
nRBC: 0 % (ref 0.0–0.2)

## 2024-02-17 LAB — PSA: Prostatic Specific Antigen: <0.02 ng/mL (ref 0.00–4.00)

## 2024-02-19 ENCOUNTER — Other Ambulatory Visit: Payer: Self-pay | Admitting: *Deleted

## 2024-02-19 DIAGNOSIS — C61 Malignant neoplasm of prostate: Secondary | ICD-10-CM

## 2024-02-20 ENCOUNTER — Ambulatory Visit
Admission: RE | Admit: 2024-02-20 | Discharge: 2024-02-20 | Disposition: A | Discharge status: Home / Self Care | Source: Ambulatory Visit | Attending: Radiation Oncology | Admitting: Radiation Oncology

## 2024-02-20 ENCOUNTER — Encounter: Payer: Self-pay | Admitting: Radiation Oncology

## 2024-02-20 ENCOUNTER — Inpatient Hospital Stay

## 2024-02-20 VITALS — BP 135/82 | HR 73 | Temp 98.6°F | Resp 20 | Wt 250.0 lb

## 2024-02-20 DIAGNOSIS — C61 Malignant neoplasm of prostate: Secondary | ICD-10-CM | POA: Diagnosis not present

## 2024-02-20 DIAGNOSIS — Z923 Personal history of irradiation: Secondary | ICD-10-CM | POA: Insufficient documentation

## 2024-02-20 DIAGNOSIS — Z191 Hormone sensitive malignancy status: Secondary | ICD-10-CM | POA: Diagnosis not present

## 2024-02-20 NOTE — Progress Notes (Signed)
 Radiation Oncology Follow up Note  Name: Alan [name]   Date:   02/20/2024 MRN:  [id number] DOB: 03-Feb-1946    This 78 y.o. 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  REFERRING PROVIDER: Gretta Comer POUR, NP  HPI: Patient is a 78 year old male now out 4 months having completed IMRT radiation therapy for a Gleason 7 (4+3) adeno carcinoma the prostate.  Seen today in routine follow-up he is doing well.  He specifically denies any increased lower urinary tract symptoms diarrhea or fatigue.  His most recent PSA is less than 0.02 showing excellent biochemical control of his prostate cancer..  COMPLICATIONS OF TREATMENT: none  FOLLOW UP COMPLIANCE: keeps appointments   PHYSICAL EXAM:  BP 135/82   Pulse 73   Temp 98.6 F (37 C) (Tympanic)   Resp 20   Wt 250 lb (113.4 kg)   BMI 33.91 kg/m  Well-developed well-nourished patient in NAD. HEENT reveals PERLA, EOMI, discs not visualized.  Oral cavity is clear. No oral mucosal lesions are identified. Neck is clear without evidence of cervical or supraclavicular adenopathy. Lungs are clear to A&P. Cardiac examination is essentially unremarkable with regular rate and rhythm without murmur rub or thrill. Abdomen is benign with no organomegaly or masses noted. Motor sensory and DTR levels are equal and symmetric in the upper and lower extremities. Cranial nerves II through XII are grossly intact. Proprioception is intact. No peripheral adenopathy or edema is identified. No motor or sensory levels are noted. Crude visual fields are within normal range.  RADIOLOGY RESULTS: No current films for review  PLAN: The present time patient is under excellent biochemical control of his prostate cancer.  I have asked to see him back in 6 months for follow-up with repeat PSA.  Patient knows to call  with any concerns.  I would like to take this opportunity to thank you for allowing me to participate in the care of your patient.SABRA Marcey Penton, MD

## 2024-02-24 ENCOUNTER — Inpatient Hospital Stay

## 2024-03-03 NOTE — Progress Notes (Unsigned)
     Kregg Cihlar T. Jesyka Slaght, MD, CAQ Sports Medicine [facility] at [facility] [address] [place], [address]  Phone: [phone]  FAX: [phone]  Alan [name] - 78 y.o. male  MRN [id number]  Date of Birth: October 07, 1945  Date: 03/04/2024  PCP: Gretta Comer POUR, NP  Referral: Gretta Comer POUR, NP  No chief complaint on file.  Subjective:   Alan [name] is a 78 y.o. very pleasant male patient with There is no height or weight on file to calculate BMI. who presents with the following:  Discussed the use of AI scribe software for clinical note transcription with the patient, who gave verbal consent to proceed.  Patient presents with ongoing right-sided shoulder pain. History of Present Illness     Review of Systems is noted in the HPI, as appropriate  Objective:   There were no vitals taken for this visit.  GEN: No acute distress; alert,appropriate. PULM: Breathing comfortably in no respiratory distress PSYCH: Normally interactive.   Laboratory and Imaging Data:  Assessment and Plan:   No diagnosis found. Assessment & Plan   Medication Management during today's office visit: No orders of the defined types were placed in this encounter.  There are no discontinued medications.  Orders placed today for conditions managed today: No orders of the defined types were placed in this encounter.   Disposition: No follow-ups on file.  [employer] One speech-to-text software was used for transcription in this dictation.  Possible transcriptional errors can occur using Animal nutritionist.   Signed,  [name]. Aviah Sorci, MD   Outpatient Encounter Medications as of 03/04/2024  Medication Sig   acetaminophen  (TYLENOL ) 500 MG tablet Take 1,000 mg by mouth every 6 (six) hours as needed.   amLODipine  (NORVASC ) 5 MG tablet Take 1 tablet (5 mg total) by mouth daily. for blood pressure.   aspirin  EC 81 MG tablet Take 1 tablet (81 mg total)  by mouth daily at 6 (six) AM. Swallow whole.   atorvastatin  (LIPITOR) 80 MG tablet TAKE 1 TABLET BY MOUTH EVERY DAY FOR CHOLESTEROL   cyclobenzaprine  (FLEXERIL ) 5 MG tablet Take 1 tablet (5 mg total) by mouth 3 (three) times daily as needed for muscle spasms.   fish oil-omega-3 fatty acids 1000 MG capsule Take 2 g by mouth daily.   fluticasone  (CUTIVATE ) 0.05 % cream APPLY TWICE DAILY TO FACE AND EARS FOR 1 WEEK ON AND 1 WEEK OFF, REPEAT AS NEEDED ONLY   ketoconazole (NIZORAL) 2 % shampoo as needed.   losartan  (COZAAR ) 100 MG tablet TAKE 1 TABLET BY MOUTH EVERY DAY FOR BLOOD PRESSURE   montelukast  (SINGULAIR ) 10 MG tablet TAKE 1 TABLET (10 MG TOTAL) BY MOUTH AT BEDTIME FOR ALLERGIES   Multiple Vitamin (MULTIVITAMIN) tablet Take 1 tablet by mouth daily.   pantoprazole  (PROTONIX ) 40 MG tablet TAKE 1 TABLET BY MOUTH EVERY DAY FOR HEARTBURN   sertraline  (ZOLOFT ) 100 MG tablet TAKE 1 TABLET (100 MG TOTAL) BY MOUTH DAILY. FOR ANXIETY AND DEPRESSION.   No facility-administered encounter medications on file as of 03/04/2024.

## 2024-03-04 ENCOUNTER — Ambulatory Visit: Admitting: Family Medicine

## 2024-03-04 ENCOUNTER — Encounter: Payer: Self-pay | Admitting: Family Medicine

## 2024-03-04 ENCOUNTER — Ambulatory Visit
Admission: RE | Admit: 2024-03-04 | Discharge: 2024-03-04 | Disposition: A | Source: Ambulatory Visit | Attending: Family Medicine | Admitting: Family Medicine

## 2024-03-04 VITALS — BP 140/60 | HR 72 | Temp 98.0°F | Ht 72.0 in | Wt 248.4 lb

## 2024-03-04 DIAGNOSIS — M19011 Primary osteoarthritis, right shoulder: Secondary | ICD-10-CM | POA: Diagnosis not present

## 2024-03-04 DIAGNOSIS — M25511 Pain in right shoulder: Secondary | ICD-10-CM | POA: Diagnosis not present

## 2024-03-04 DIAGNOSIS — G8929 Other chronic pain: Secondary | ICD-10-CM | POA: Diagnosis not present

## 2024-03-04 DIAGNOSIS — M5412 Radiculopathy, cervical region: Secondary | ICD-10-CM

## 2024-03-04 MED ORDER — PREDNISONE 20 MG PO TABS: ORAL_TABLET | ORAL | 0 refills | Status: DC

## 2024-03-05 ENCOUNTER — Encounter: Payer: Self-pay | Admitting: Family Medicine

## 2024-03-11 ENCOUNTER — Ambulatory Visit: Payer: Self-pay | Admitting: Family Medicine

## 2024-03-28 NOTE — Progress Notes (Unsigned)
" ° ° ° °  Alan [name] [name], MD, CAQ Sports Medicine [facility] at [facility] [address] [place], [address]  Phone: [phone]  FAX: [phone]  Alan [name] - 78 y.o. male  MRN [id number]  Date of Birth: 1945/06/22  Date: 03/30/2024  PCP: Gretta Comer POUR, NP  Referral: Gretta Comer POUR, NP  No chief complaint on file.  Subjective:   Alan [name] is a 78 y.o. very pleasant male patient with There is no height or weight on file to calculate BMI. who presents with the following:  Discussed the use of AI scribe software for clinical note transcription with the patient, who gave verbal consent to proceed.  [name] is here to follow-up regarding right sided shoulder pain.  I saw him roughly 3 to 4 weeks ago, at that point I placed him on some oral steroids, because I thought some of his pain was referred from the neck.  They would like to have an intra-articular injection. History of Present Illness     Review of Systems is noted in the HPI, as appropriate  Objective:   There were no vitals taken for this visit.  GEN: No acute distress; alert,appropriate. PULM: Breathing comfortably in no respiratory distress PSYCH: Normally interactive.   Laboratory and Imaging Data:  Assessment and Plan:   No diagnosis found. Assessment & Plan   Medication Management during today's office visit: No orders of the defined types were placed in this encounter.  There are no discontinued medications.  Orders placed today for conditions managed today: No orders of the defined types were placed in this encounter.   Disposition: No follow-ups on file.  [employer] One speech-to-text software was used for transcription in this dictation.  Possible transcriptional errors can occur using Animal nutritionist.   Signed,  [name]. [name], MD   Outpatient Encounter Medications as of 03/30/2024  Medication Sig   acetaminophen  (TYLENOL ) 500 MG  tablet Take 1,000 mg by mouth every 6 (six) hours as needed.   amLODipine  (NORVASC ) 5 MG tablet Take 1 tablet (5 mg total) by mouth daily. for blood pressure.   aspirin  EC 81 MG tablet Take 1 tablet (81 mg total) by mouth daily at 6 (six) AM. Swallow whole.   atorvastatin  (LIPITOR) 80 MG tablet TAKE 1 TABLET BY MOUTH EVERY DAY FOR CHOLESTEROL   cyclobenzaprine  (FLEXERIL ) 5 MG tablet Take 1 tablet (5 mg total) by mouth 3 (three) times daily as needed for muscle spasms.   fish oil-omega-3 fatty acids 1000 MG capsule Take 2 g by mouth daily.   fluticasone  (CUTIVATE ) 0.05 % cream APPLY TWICE DAILY TO FACE AND EARS FOR 1 WEEK ON AND 1 WEEK OFF, REPEAT AS NEEDED ONLY   ketoconazole (NIZORAL) 2 % shampoo as needed.   losartan  (COZAAR ) 100 MG tablet TAKE 1 TABLET BY MOUTH EVERY DAY FOR BLOOD PRESSURE   montelukast  (SINGULAIR ) 10 MG tablet TAKE 1 TABLET (10 MG TOTAL) BY MOUTH AT BEDTIME FOR ALLERGIES   Multiple Vitamin (MULTIVITAMIN) tablet Take 1 tablet by mouth daily.   pantoprazole  (PROTONIX ) 40 MG tablet TAKE 1 TABLET BY MOUTH EVERY DAY FOR HEARTBURN   predniSONE  (DELTASONE ) 20 MG tablet 2 tabs po daily for 5 days, then 1 tab po daily for 5 days   sertraline  (ZOLOFT ) 100 MG tablet TAKE 1 TABLET (100 MG TOTAL) BY MOUTH DAILY. FOR ANXIETY AND DEPRESSION.   No facility-administered encounter medications on file as of 03/30/2024.   "

## 2024-03-30 ENCOUNTER — Ambulatory Visit: Admitting: Family Medicine

## 2024-03-30 ENCOUNTER — Encounter: Payer: Self-pay | Admitting: Family Medicine

## 2024-03-30 VITALS — BP 120/70 | HR 101 | Temp 98.6°F | Ht 72.0 in | Wt 252.4 lb

## 2024-03-30 DIAGNOSIS — G8929 Other chronic pain: Secondary | ICD-10-CM | POA: Diagnosis not present

## 2024-03-30 DIAGNOSIS — M25511 Pain in right shoulder: Secondary | ICD-10-CM | POA: Diagnosis not present

## 2024-03-30 MED ORDER — TRIAMCINOLONE ACETONIDE 40 MG/ML IJ SUSP
40.0000 mg | Freq: Once | INTRAMUSCULAR | Status: AC
  Administered 2024-03-30: 40 mg via INTRA_ARTICULAR

## 2024-03-30 MED ORDER — CELECOXIB 200 MG PO CAPS: 200.0000 mg | ORAL_CAPSULE | Freq: Every day | ORAL | 2 refills | Status: AC

## 2024-06-05 ENCOUNTER — Other Ambulatory Visit

## 2024-06-05 ENCOUNTER — Ambulatory Visit: Admitting: Oncology

## 2024-06-19 ENCOUNTER — Ambulatory Visit: Admitting: Oncology

## 2024-06-19 ENCOUNTER — Other Ambulatory Visit

## 2024-08-12 ENCOUNTER — Inpatient Hospital Stay

## 2024-08-20 ENCOUNTER — Ambulatory Visit: Admitting: Radiation Oncology

## 2024-09-23 ENCOUNTER — Ambulatory Visit (INDEPENDENT_AMBULATORY_CARE_PROVIDER_SITE_OTHER): Admitting: Nurse Practitioner

## 2024-09-23 ENCOUNTER — Encounter (INDEPENDENT_AMBULATORY_CARE_PROVIDER_SITE_OTHER)

## 2024-10-08 ENCOUNTER — Ambulatory Visit
